# Patient Record
Sex: Female | Born: 1970 | Hispanic: No | Smoking: Current some day smoker
Health system: Southern US, Community
[De-identification: ages and names within clinical notes are randomized; demographics above are authoritative.]

## PROBLEM LIST (undated history)

## (undated) DIAGNOSIS — F419 Anxiety disorder, unspecified: Secondary | ICD-10-CM

## (undated) DIAGNOSIS — D219 Benign neoplasm of connective and other soft tissue, unspecified: Secondary | ICD-10-CM

## (undated) DIAGNOSIS — F32A Depression, unspecified: Secondary | ICD-10-CM

## (undated) DIAGNOSIS — D649 Anemia, unspecified: Secondary | ICD-10-CM

## (undated) DIAGNOSIS — G43909 Migraine, unspecified, not intractable, without status migrainosus: Secondary | ICD-10-CM

## (undated) HISTORY — DX: Depression, unspecified: F32.A

## (undated) HISTORY — DX: Anxiety disorder, unspecified: F41.9

## (undated) HISTORY — DX: Migraine, unspecified, not intractable, without status migrainosus: G43.909

## (undated) HISTORY — PX: EYE SURGERY: SHX253

## (undated) HISTORY — PX: TONSILLECTOMY: SUR1361

---

## 2003-10-30 ENCOUNTER — Other Ambulatory Visit: Admission: RE | Admit: 2003-10-30 | Discharge: 2003-10-30 | Payer: Self-pay | Admitting: Obstetrics and Gynecology

## 2008-04-09 ENCOUNTER — Encounter: Admission: RE | Admit: 2008-04-09 | Discharge: 2008-04-09 | Payer: Self-pay | Admitting: Obstetrics and Gynecology

## 2008-04-09 IMAGING — MG MM DIAGNOSTIC BILATERAL
5 series · 5 of 5 positions shown · non-contrast
Comparison: None

[DATE] – DUPLICATE COPY for exam association in RIS. No change from original report.
CLINICAL DATA: Mass felt by the patient in the upper outer left
 breast.

 DIGITAL DIAGNOSTIC BILATERAL MAMMOGRAM WITH CAD AND LEFT BREAST
 ULTRASOUND

[R CC]
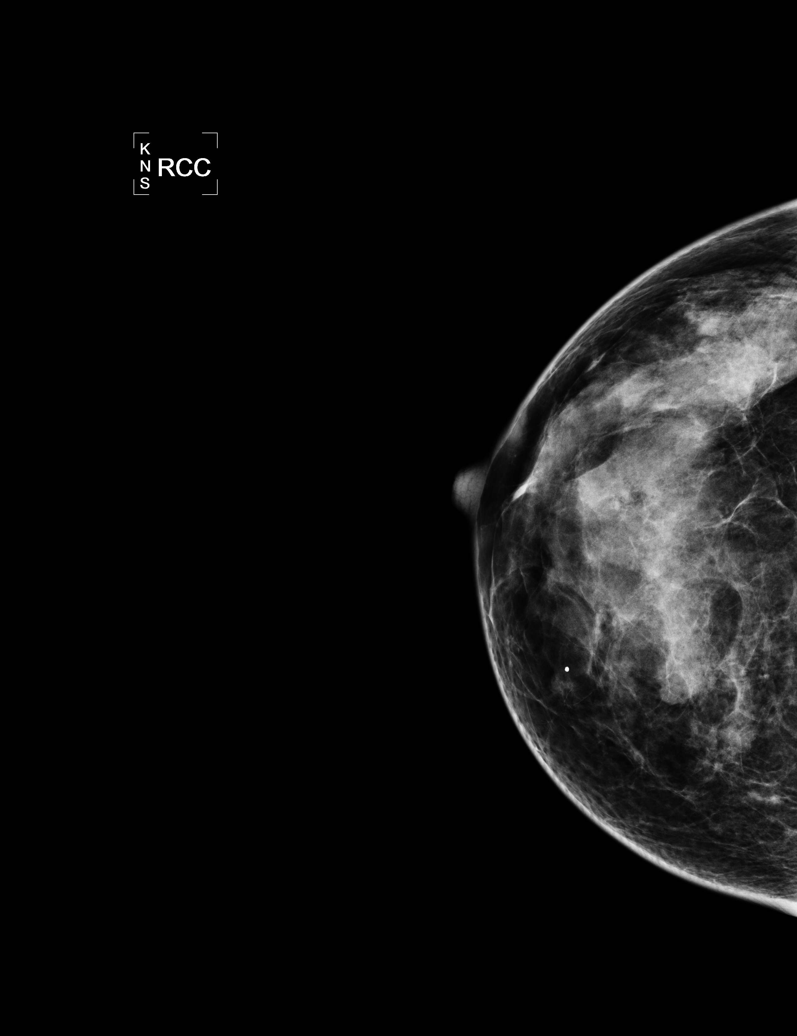

[L CC]
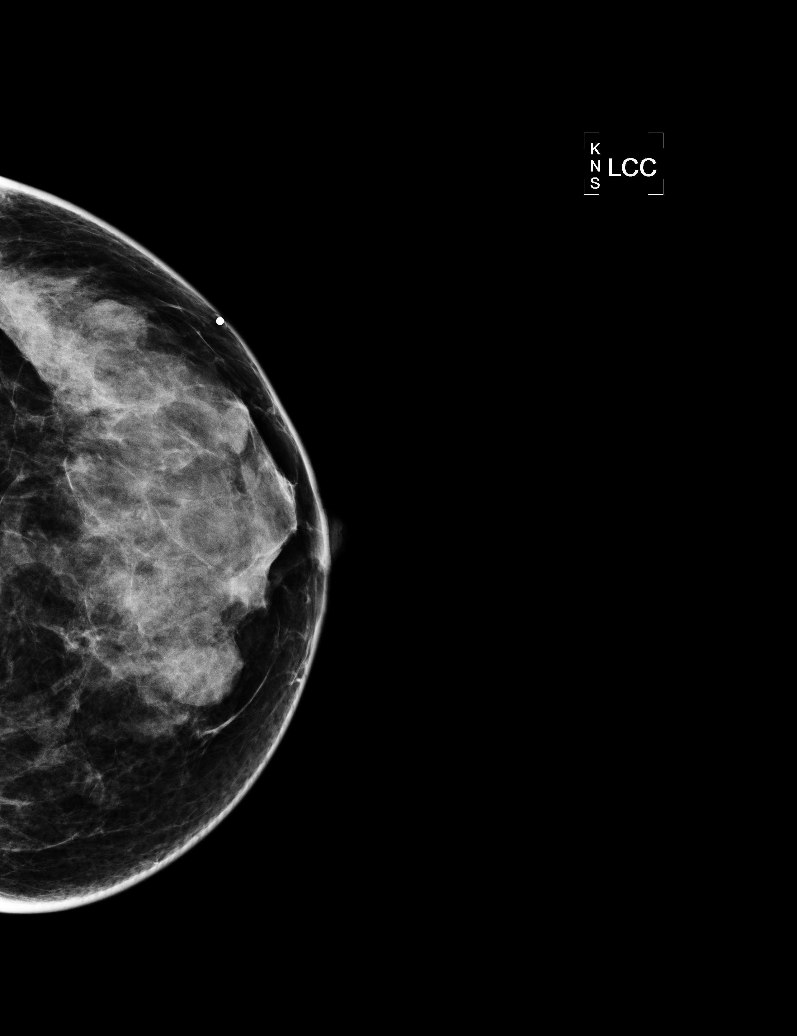

[L MLO]
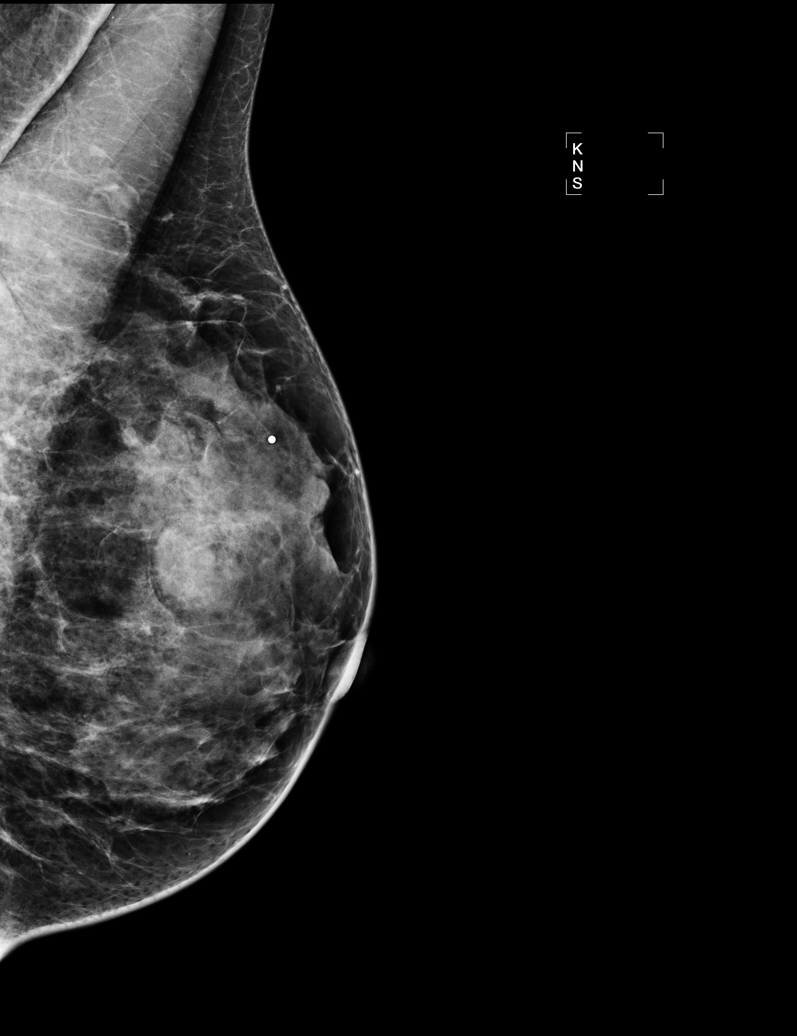

[R MLO]
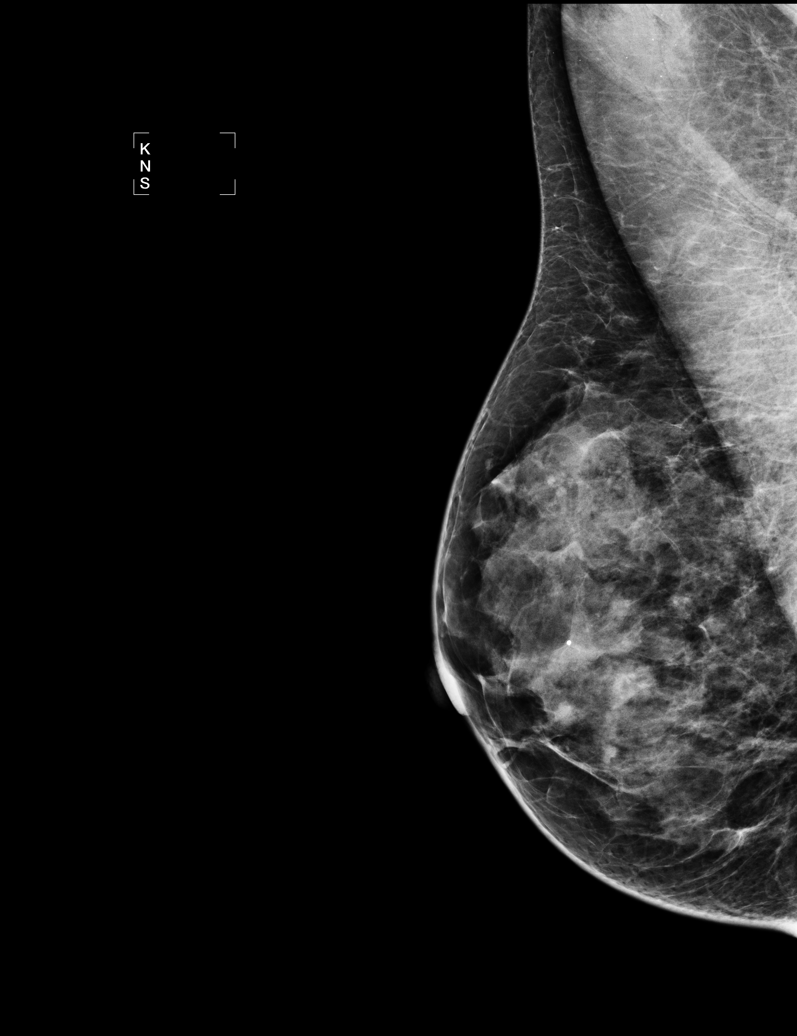

[L TAN]
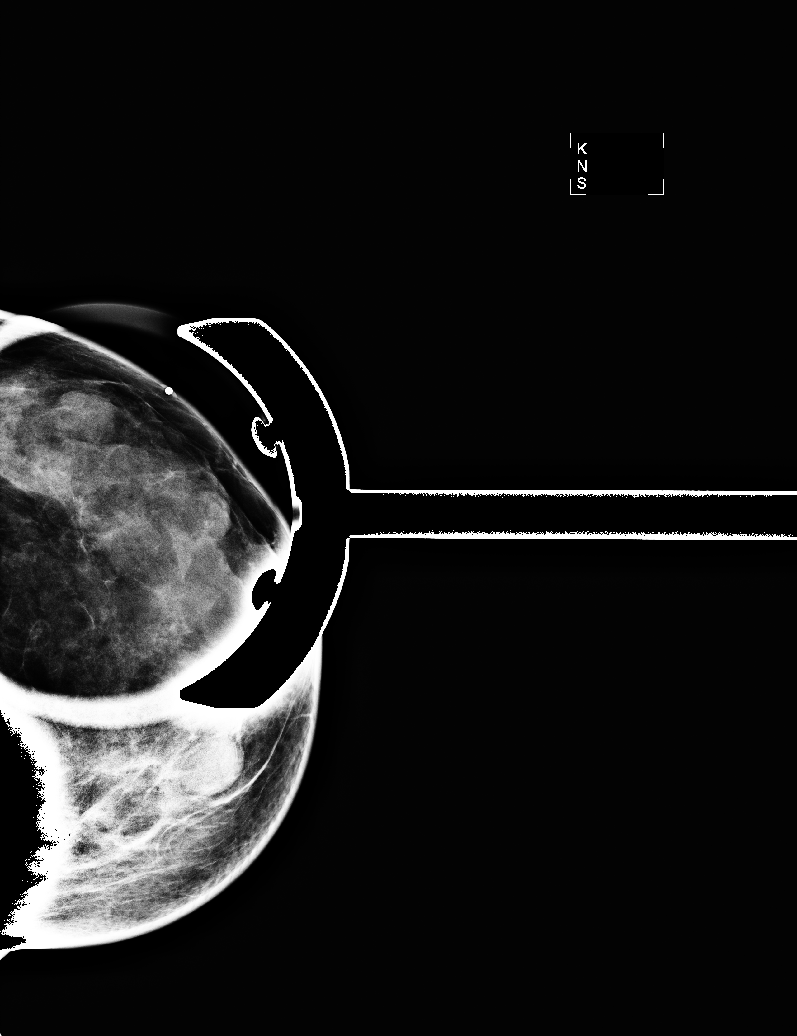

[5 of 5 positions shown; findings below may reference images not displayed]

FINDINGS: Heterogeneously dense parenchyma in each breast.
 Multiple oval and rounded nodules in the left breast, including the
 area felt by the patient. The visualized margins are smooth. The
 majority of the margins are obscured. No findings on the right
 suspicious for malignancy.

 On physical exam, a small palpable mass is confirmed at the
 location felt by the patient in the 2 o'clock position of the left
 breast, 2 cm from the nipple. A larger mass is palpable in the
 upper inner aspect of the left breast in the 11 o'clock position.

 Ultrasound is performed, showing a 1.3 x 1.2 x 0.8 cm solid mass in
 the 2 o'clock position, 2 cm from the nipple. A 0.8 x 0.7 x 0.7 cm
 solid mass is demonstrated in the 1:30 o'clock position, 1 cm from
 the nipple. There is also a a 1.7 x 1.7 x 0.6 cm solid mass in the
 11 o'clock position, 2 cm from the nipple. All of these masses are
 smoothly marginated and horizontally oriented. There is also a
 x 0.5 x 0.3 cm cyst in the 12 o'clock position, 2 cm from the
 nipple.
IMPRESSION: Three left breast solid masses with imaging features compatible
 with fibroadenomas. A follow-up left breast ultrasound is
 recommended in 6 months to assess stability. This has been
 discussed with the patient. No evidence of malignancy on the
 right.

 BI-RADS CATEGORY 3: Probably benign finding(s) - short interval
 follow-up suggested.

## 2008-04-09 IMAGING — US UNKNOWN US STUDY
1 series · 13 of 16 positions shown · non-contrast
Comparison: None

[DATE] – DUPLICATE COPY for exam association in RIS. No change from original report.
CLINICAL DATA: Mass felt by the patient in the upper outer left
 breast.

 DIGITAL DIAGNOSTIC BILATERAL MAMMOGRAM WITH CAD AND LEFT BREAST
 ULTRASOUND

[Series 1: unknown us study · 13 of 16 slices shown]
[im 1/16]
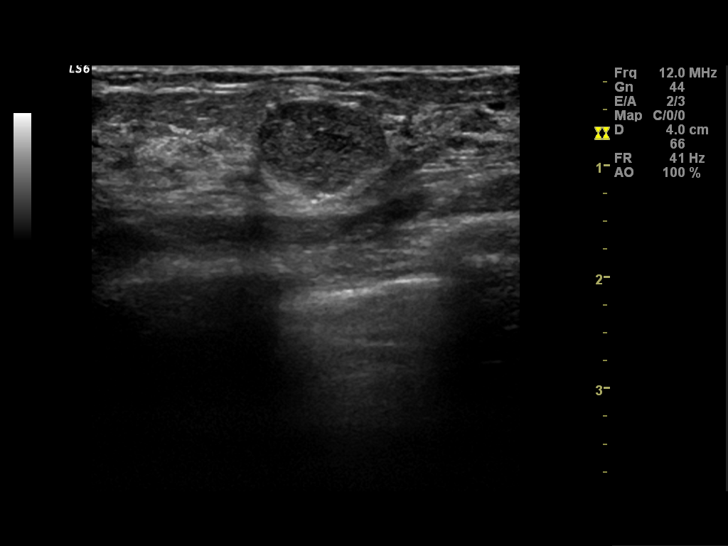
[im 2/16]
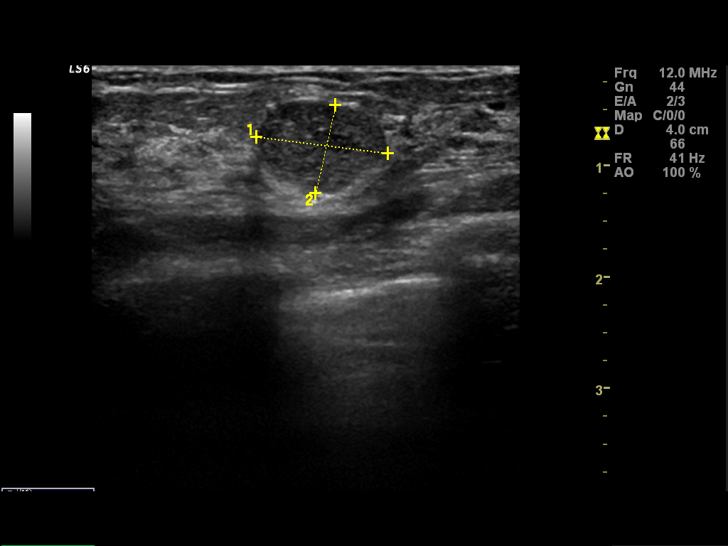
[im 4/16]
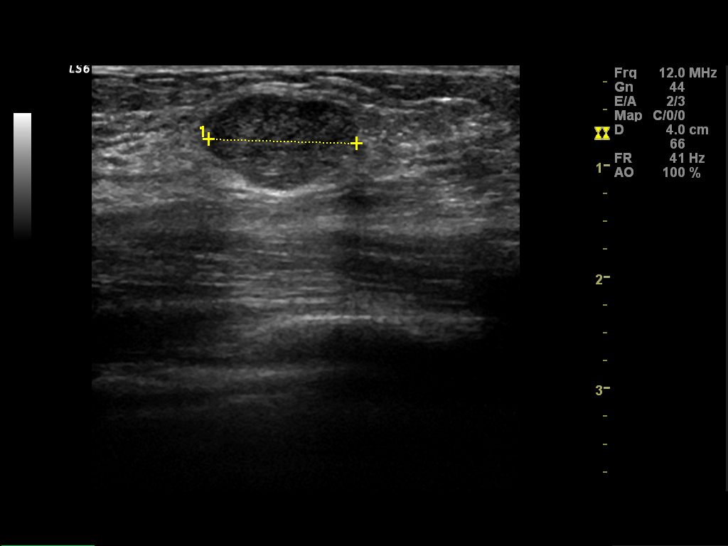
[im 5/16]
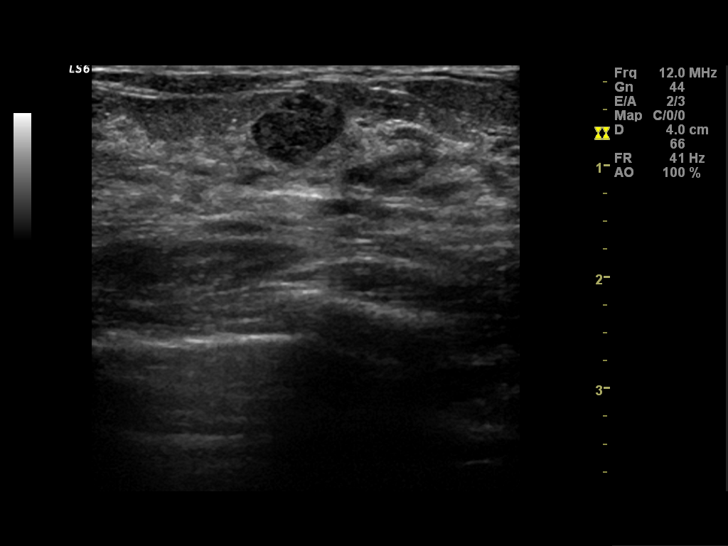
[im 6/16]
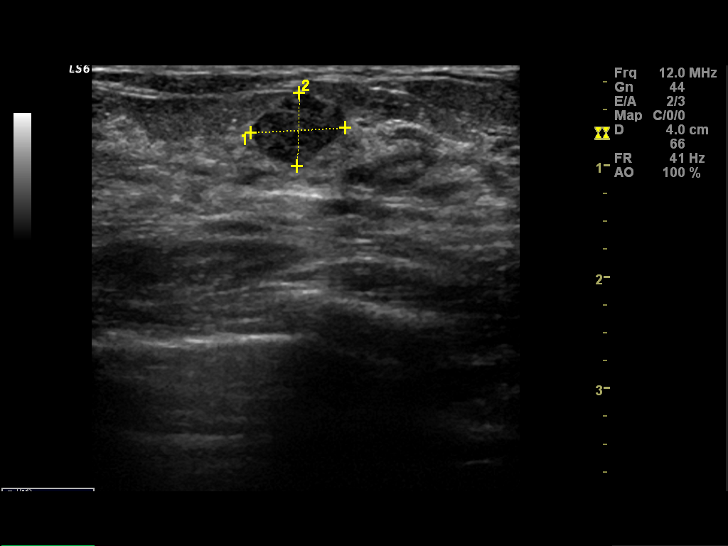
[im 7/16]
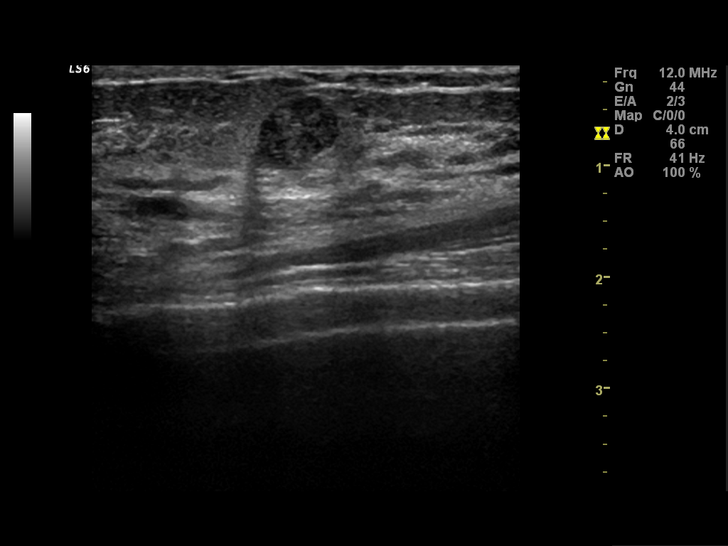
[im 9/16]
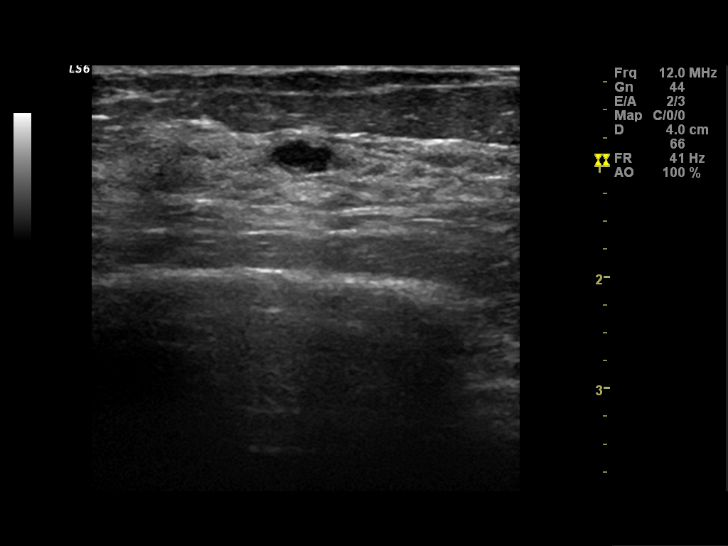
[im 10/16]
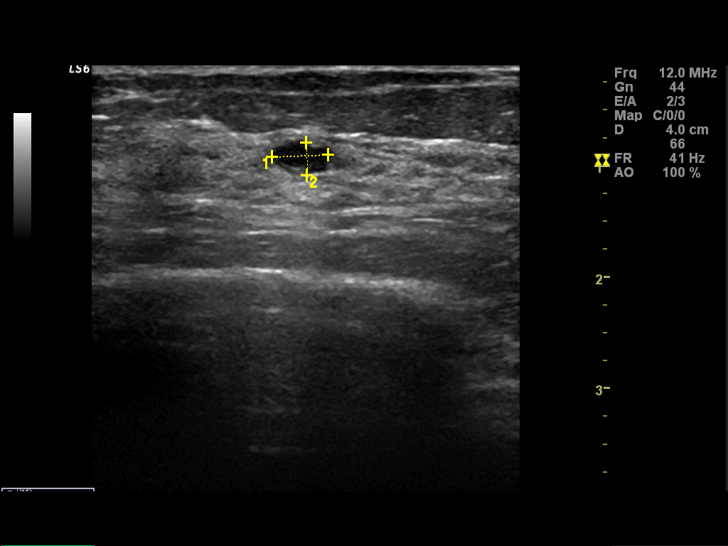
[im 11/16]
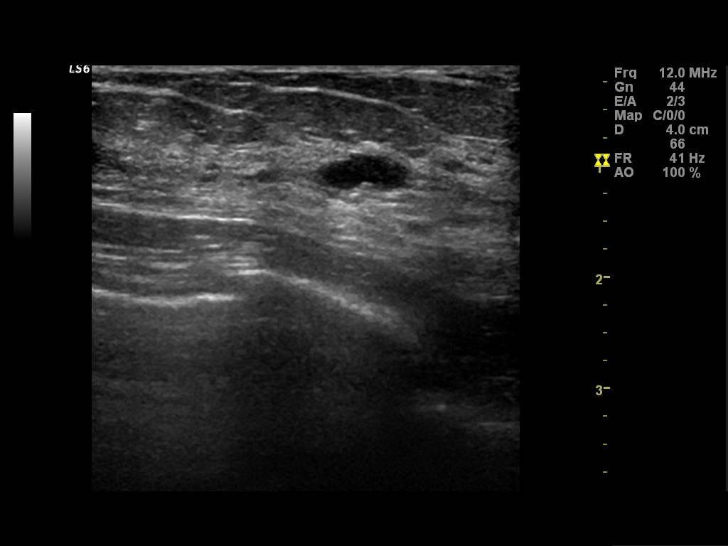
[im 12/16]
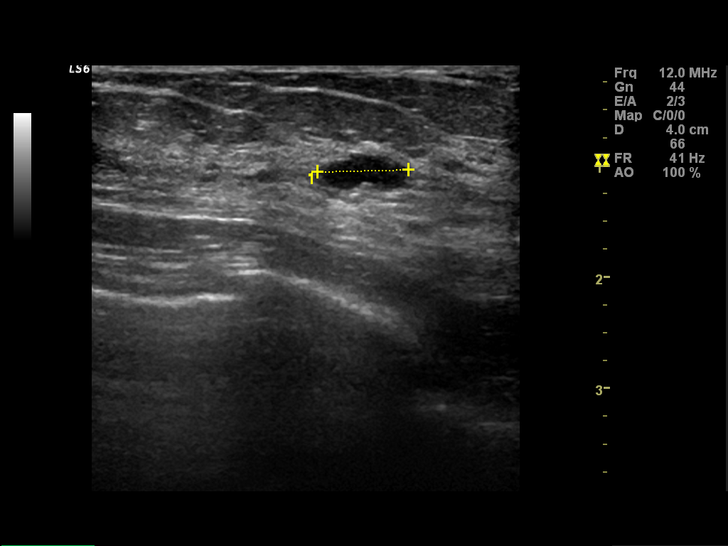
[im 13/16]
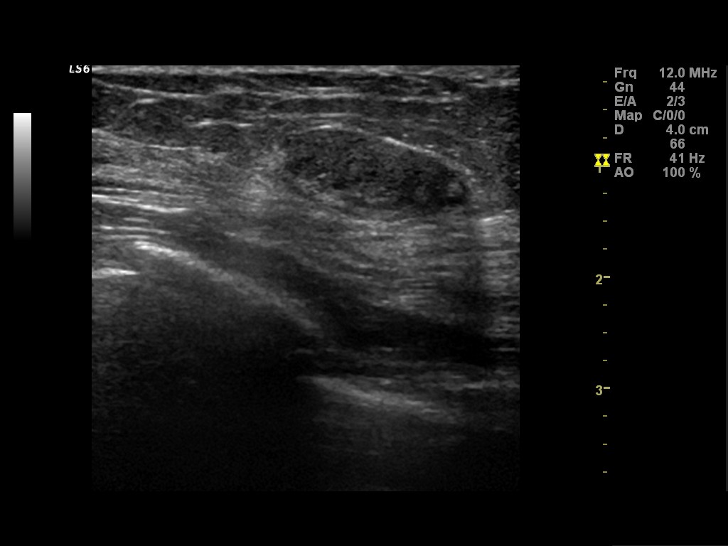
[im 15/16]
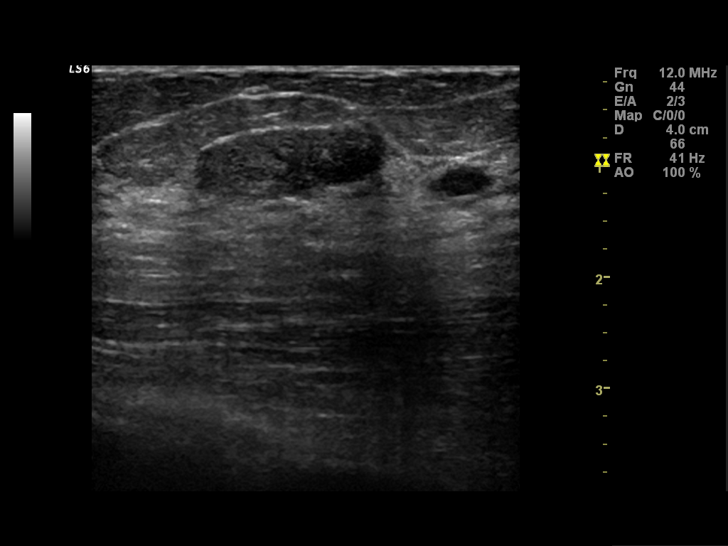
[im 16/16]
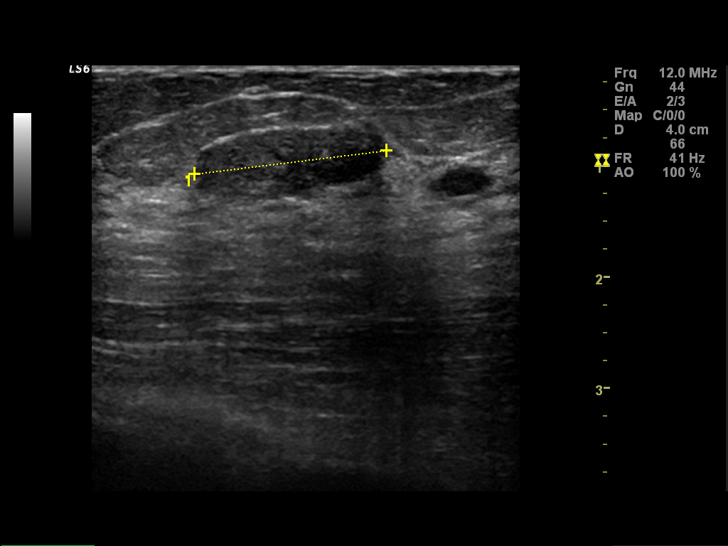

[13 of 16 positions shown; findings below may reference images not displayed]

FINDINGS: Heterogeneously dense parenchyma in each breast.
 Multiple oval and rounded nodules in the left breast, including the
 area felt by the patient. The visualized margins are smooth. The
 majority of the margins are obscured. No findings on the right
 suspicious for malignancy.

 On physical exam, a small palpable mass is confirmed at the
 location felt by the patient in the 2 o'clock position of the left
 breast, 2 cm from the nipple. A larger mass is palpable in the
 upper inner aspect of the left breast in the 11 o'clock position.

 Ultrasound is performed, showing a 1.3 x 1.2 x 0.8 cm solid mass in
 the 2 o'clock position, 2 cm from the nipple. A 0.8 x 0.7 x 0.7 cm
 solid mass is demonstrated in the 1:30 o'clock position, 1 cm from
 the nipple. There is also a a 1.7 x 1.7 x 0.6 cm solid mass in the
 11 o'clock position, 2 cm from the nipple. All of these masses are
 smoothly marginated and horizontally oriented. There is also a
 x 0.5 x 0.3 cm cyst in the 12 o'clock position, 2 cm from the
 nipple.
IMPRESSION: Three left breast solid masses with imaging features compatible
 with fibroadenomas. A follow-up left breast ultrasound is
 recommended in 6 months to assess stability. This has been
 discussed with the patient. No evidence of malignancy on the
 right.

 BI-RADS CATEGORY 3: Probably benign finding(s) - short interval
 follow-up suggested.

## 2008-10-13 ENCOUNTER — Encounter: Admission: RE | Admit: 2008-10-13 | Discharge: 2008-10-13 | Payer: Self-pay | Admitting: Obstetrics and Gynecology

## 2008-10-13 IMAGING — US UNKNOWN US STUDY
1 series · 12 of 12 positions shown · non-contrast
Comparison: [DATE]

CLINICAL DATA: 6 months follow-up the masses in the left breast

LEFT BREAST ULTRASOUND

[Series 1: unknown us study · 12 of 12 slices shown]
[im 1/12]
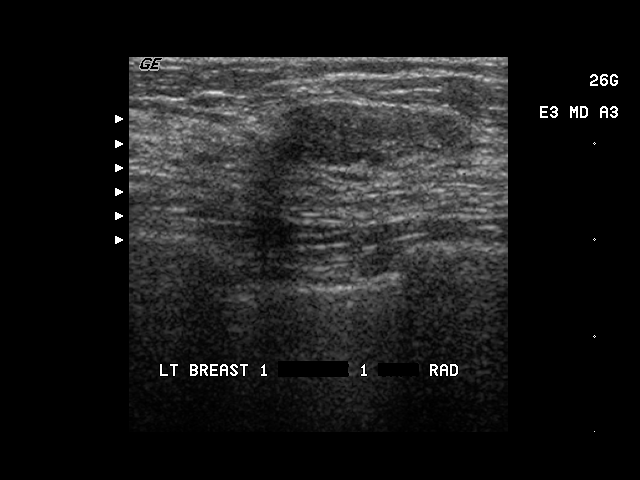
[im 2/12]
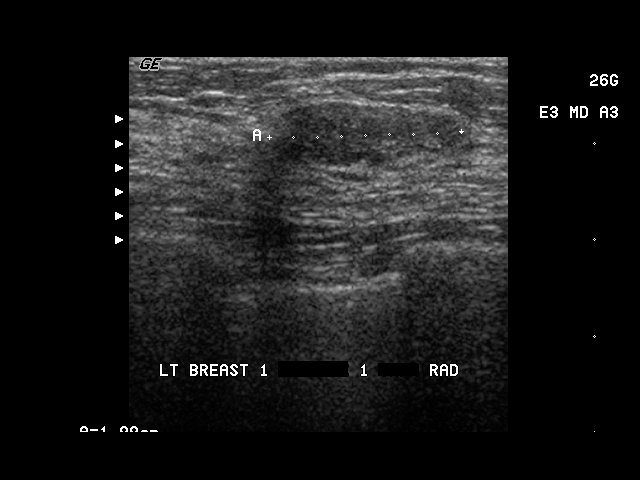
[im 3/12]
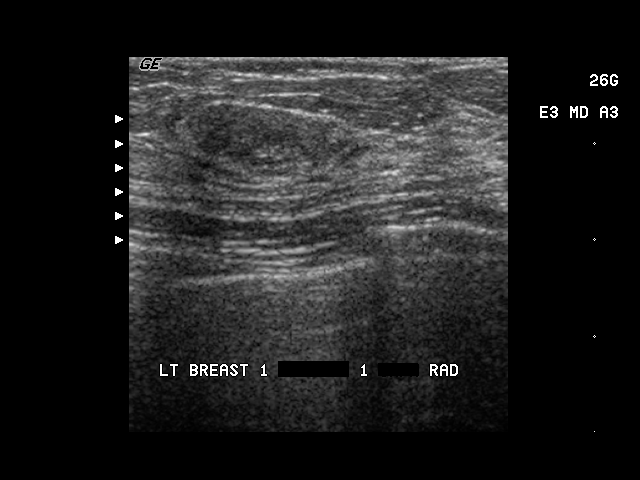
[im 4/12]
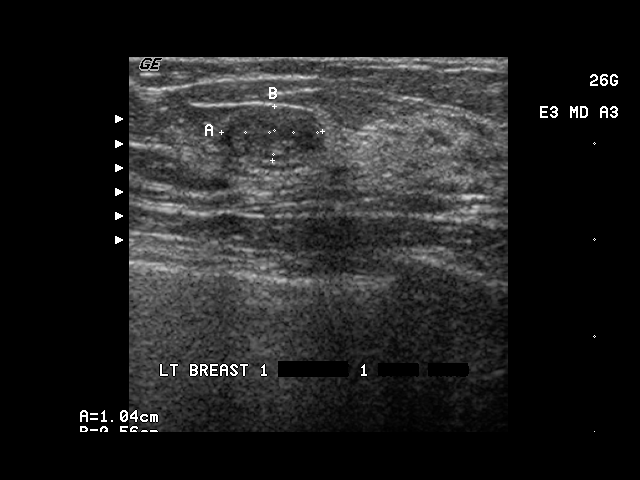
[im 5/12]
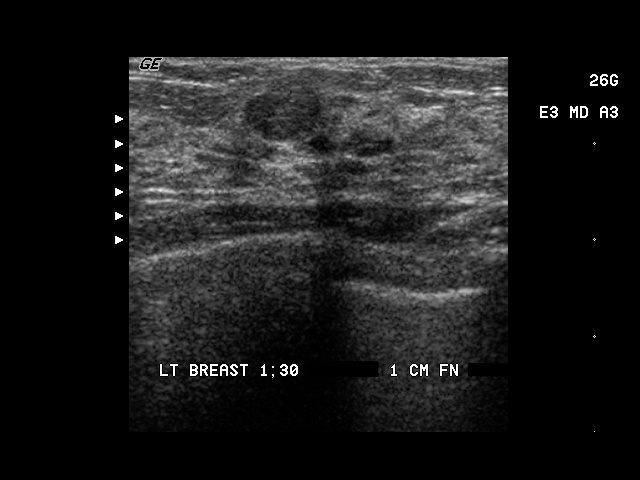
[im 6/12]
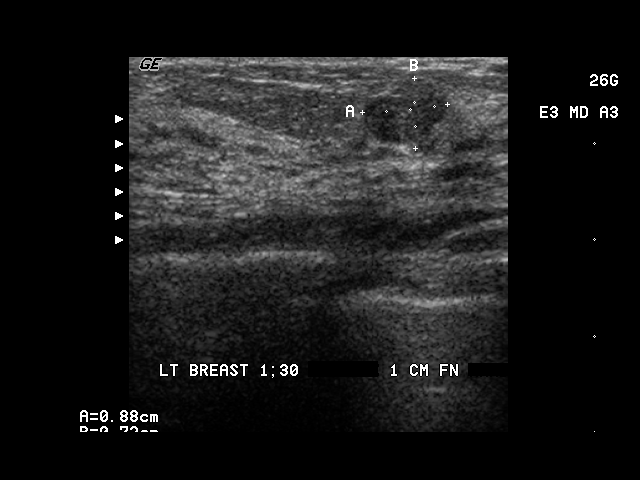
[im 7/12]
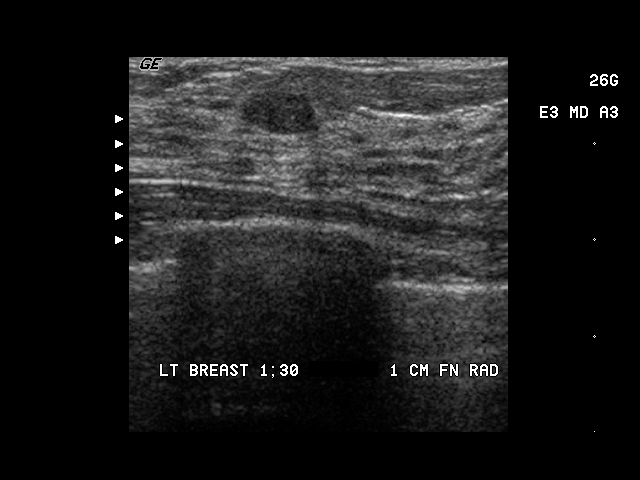
[im 8/12]
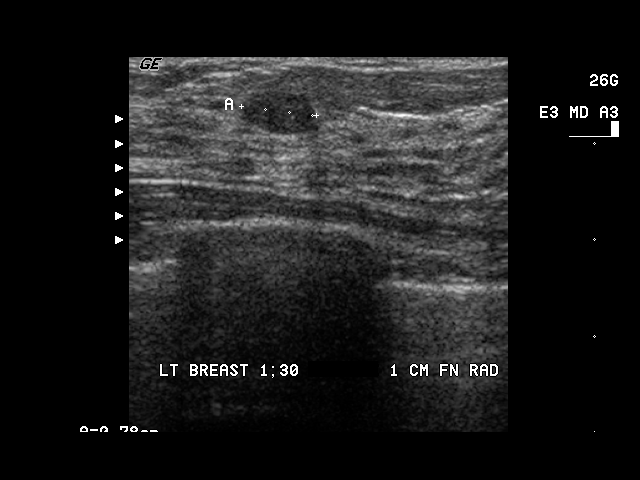
[im 9/12]
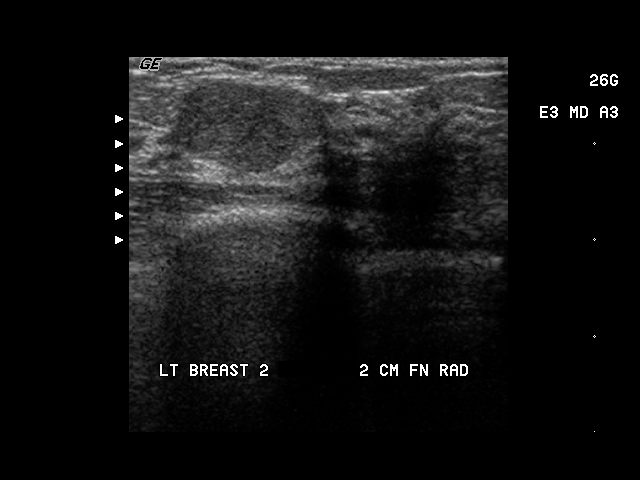
[im 10/12]
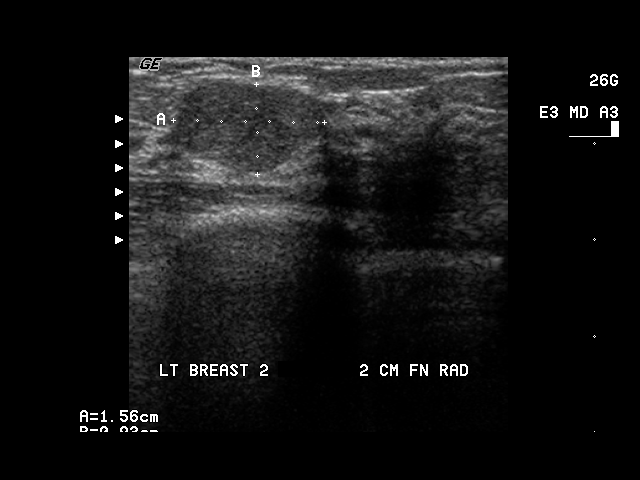
[im 11/12]
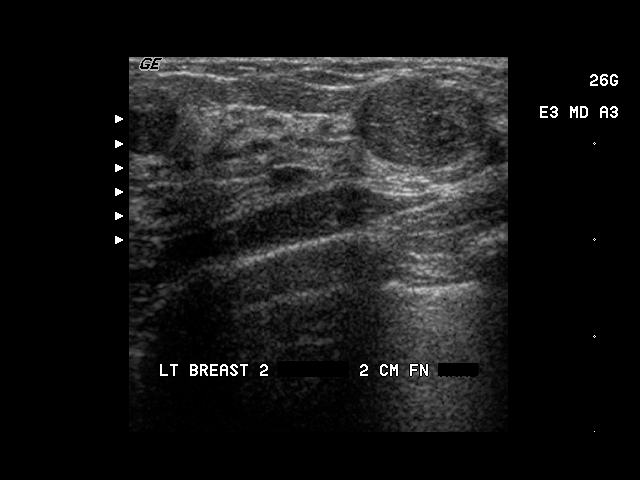
[im 12/12]
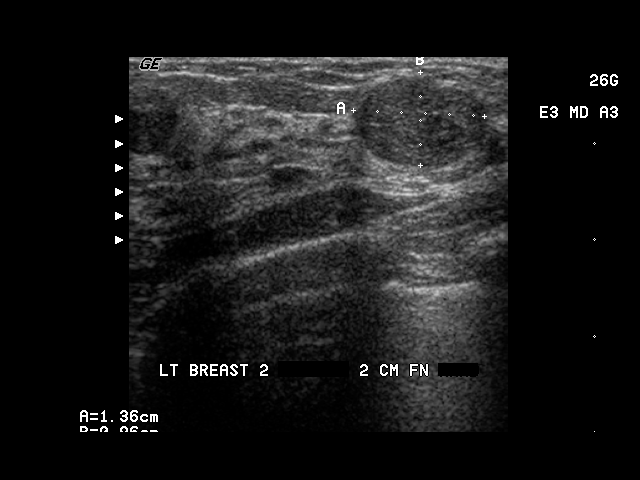

[12 of 12 positions shown; findings below may reference images not displayed]

FINDINGS: Ultrasound is performed, showing a 1.66 x 1 x 1.36 cm
oval hypoechoic lesion at the left breast two o'clock position 2 cm
from the nipple, minimally larger compared prior exam.  At the left
breast [DATE] position 1 cm from the nipple, there is a 0.88 x 0.72 x
0.78 cm oval hypoechoic lesion not significantly changed compared
prior exam.  At the left breast 11 o'clock position 1 cm from
nipple, there is a 1.04 x 0.7 x 1.99 cm oval hypoechoic lesion not
significantly changed compared to11 prior exam.  These are probably
fibroadenomas.
IMPRESSION: Probable benign findings, recommend 6-month follow-up ultrasound
left breast

BI-RADS CATEGORY 3:  Probably benign finding(s) - short interval
follow-up suggested.

## 2009-05-19 ENCOUNTER — Encounter: Admission: RE | Admit: 2009-05-19 | Discharge: 2009-05-19 | Payer: Self-pay | Admitting: Obstetrics and Gynecology

## 2009-05-19 IMAGING — US US BREAST L
1 series · 12 of 12 positions shown · non-contrast
Comparison: [DATE] and [DATE]

CLINICAL DATA: Follow-up left breast nodules.

LEFT BREAST ULTRASOUND

[Series 1: us breast left · 12 of 12 slices shown]
[im 1/12]
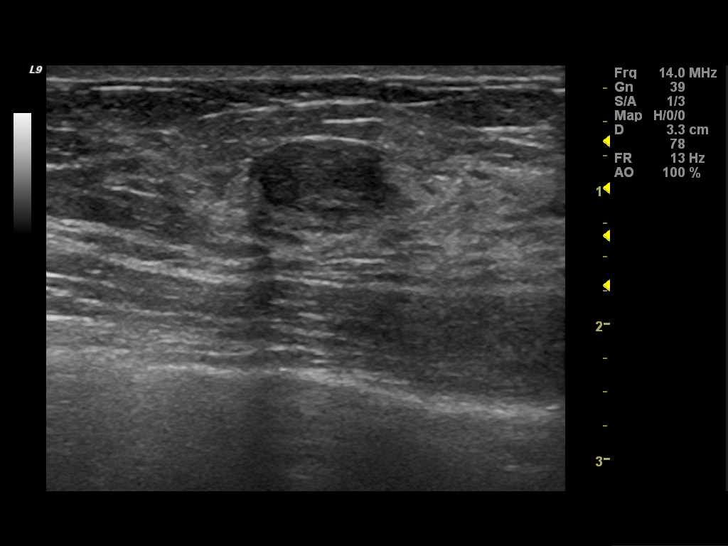
[im 2/12]
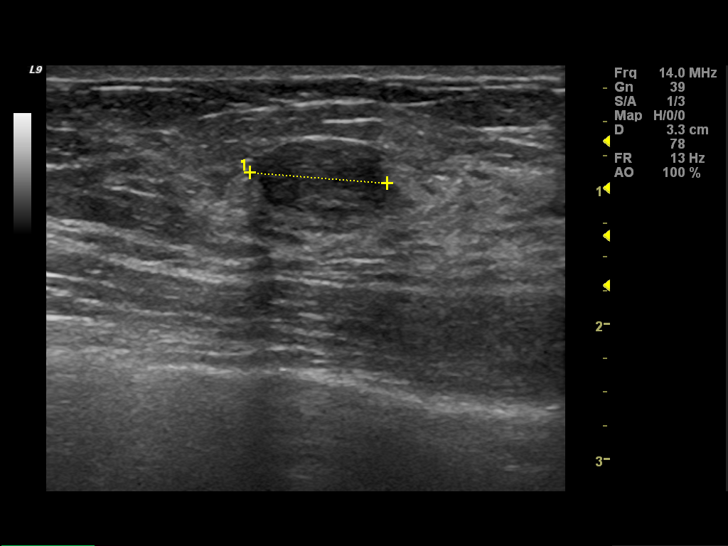
[im 3/12]
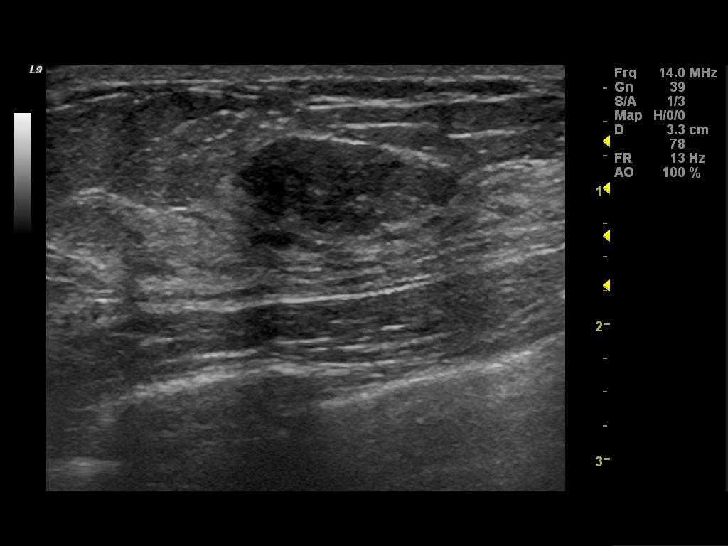
[im 4/12]
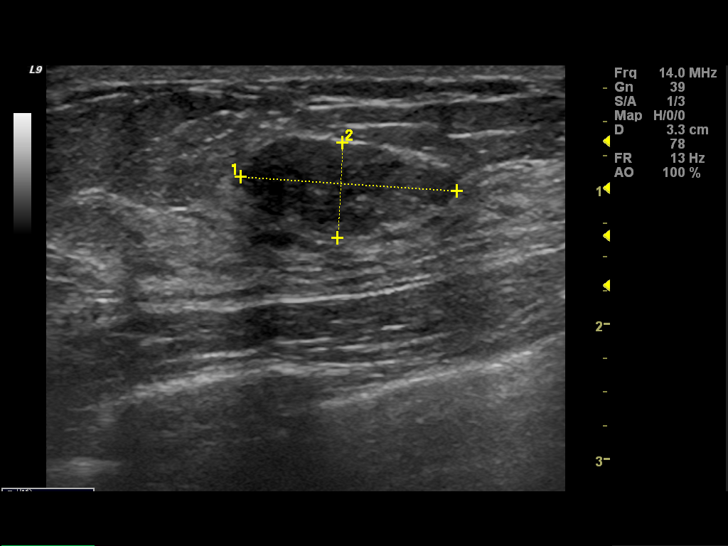
[im 5/12]
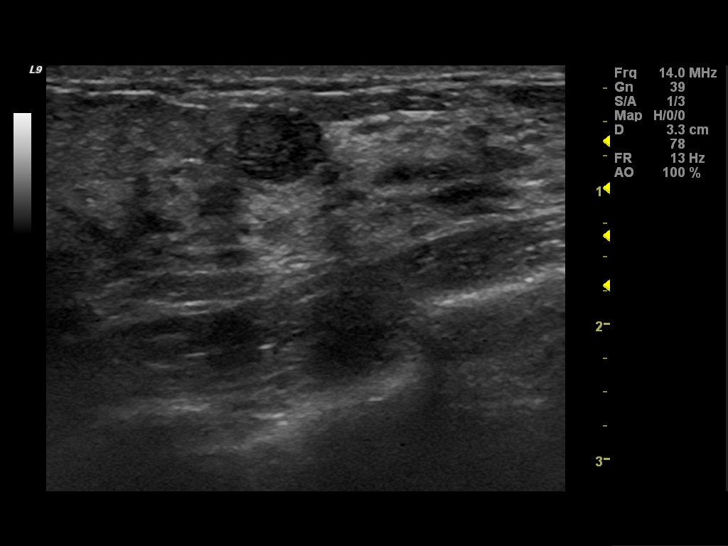
[im 6/12]
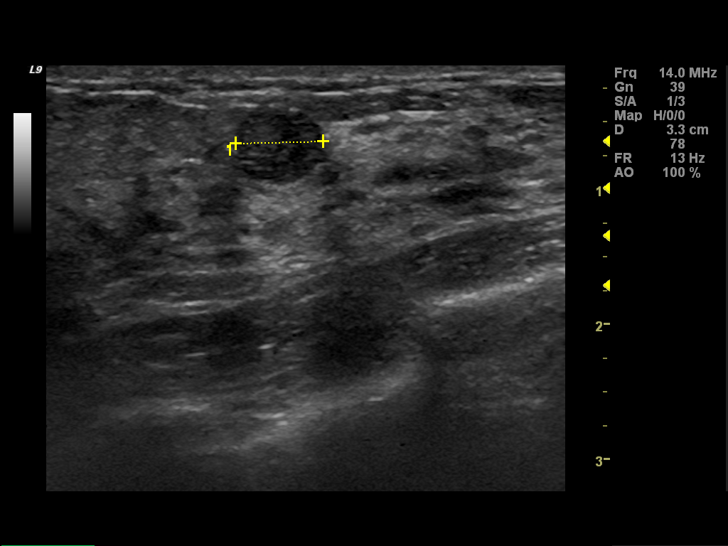
[im 7/12]
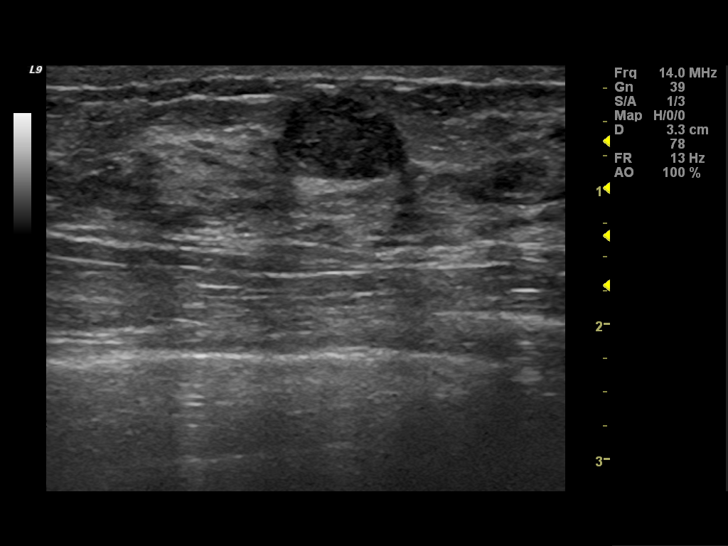
[im 8/12]
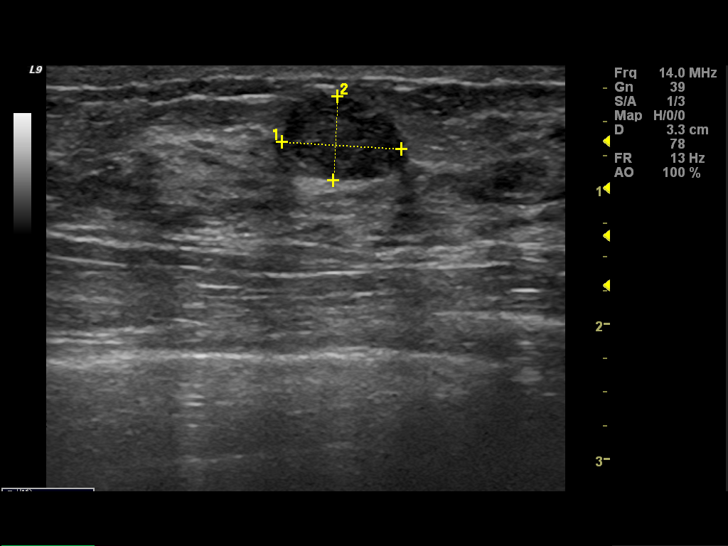
[im 9/12]
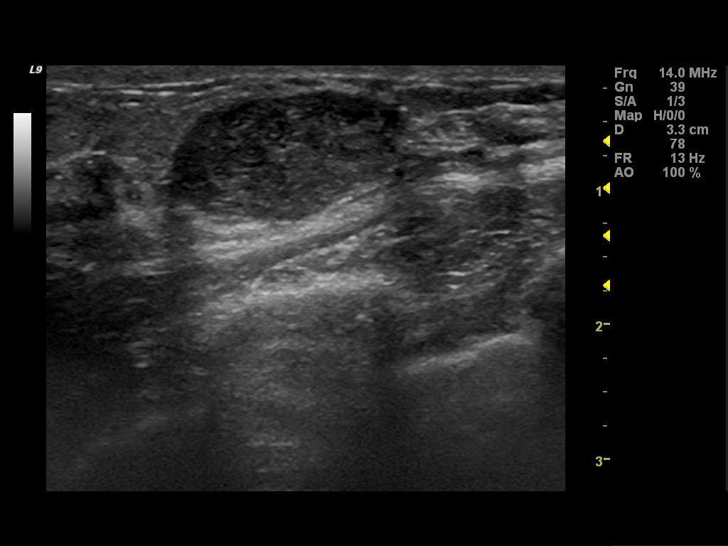
[im 10/12]
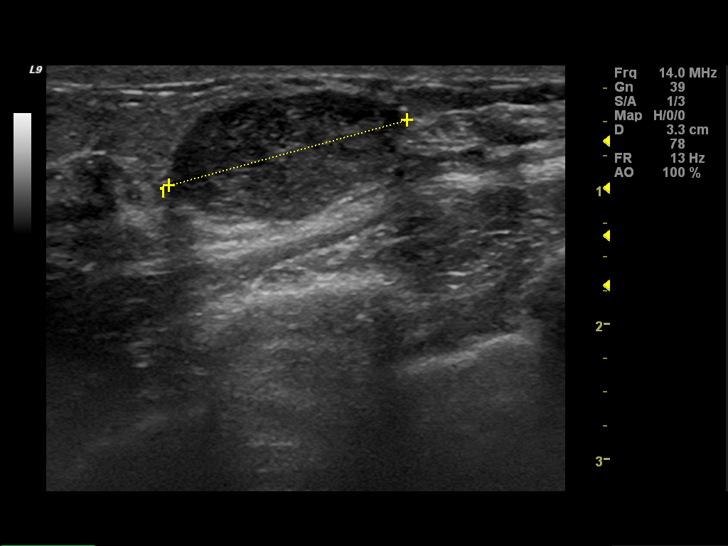
[im 11/12]
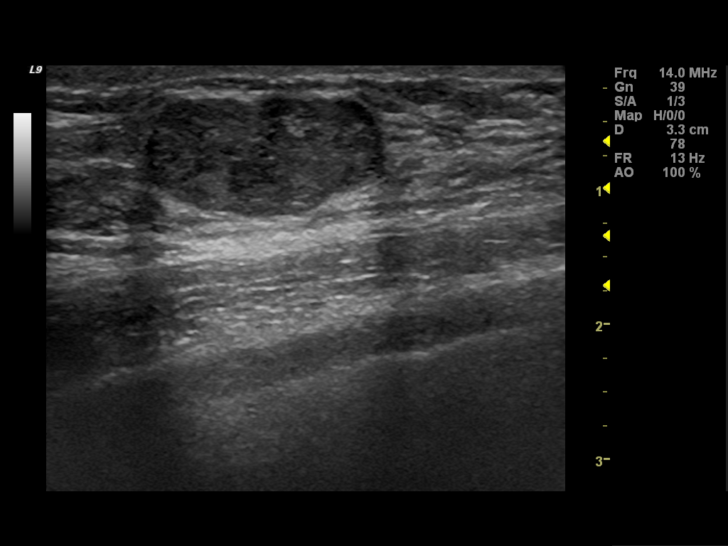
[im 12/12]
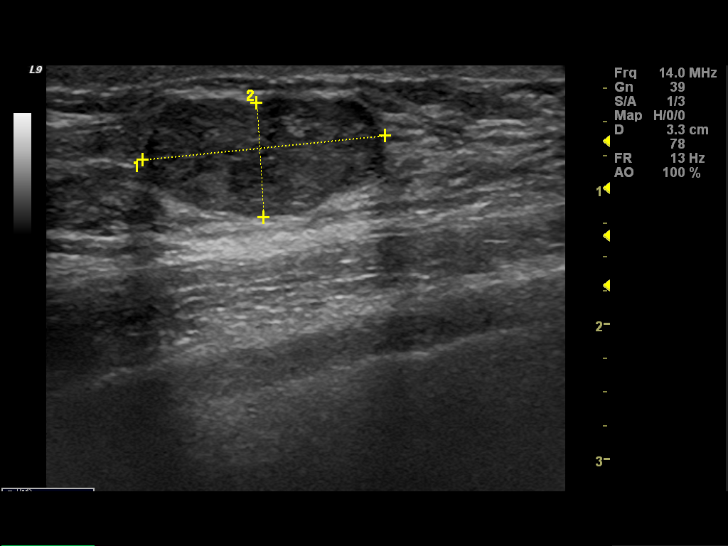

[12 of 12 positions shown; findings below may reference images not displayed]

On physical exam, several mobile nodules identified within the
upper left breast.
FINDINGS: Ultrasound is performed, showing stable well-
circumscribed hypoechoic nodules which include the following:
A 16 x 7 x 10 mm nodule in the 11 o'clock position of the left
breast 2 cm from the nipple.
An 8 x 6 x 7 mm nodule in the [DATE] position of the left breast 1 cm
from the nipple.
An 18 x 9 x 18 mm mass in the 2 o'clock position of the left breast
2 cm from the nipple.

No interval changes identified.
IMPRESSION: Stable probable fibroadenomas within the left breast.  Recommend
follow-up in 1 year to complete 2-year evaluation.

These findings were discussed with the patient.  She was encouraged
to continue monthly self exams and to contact her primary physician
if any changes noted.

BI-RADS CATEGORY 3:  Probably benign finding(s) - short interval
follow-up suggested.

Recommend bilateral diagnostic mammograms and left breast
ultrasound in 12 months to resume annual mammogram schedule and to
reassess left breast nodules.

## 2010-05-20 ENCOUNTER — Encounter: Admission: RE | Admit: 2010-05-20 | Discharge: 2010-05-20 | Payer: Self-pay | Admitting: Obstetrics and Gynecology

## 2010-05-20 IMAGING — US UNKNOWN US STUDY
1 series · 13 of 13 positions shown · non-contrast
Comparison: Multiple priors

[DATE] –DUPLICATE COPY for exam association in RIS. No change from original report.
CLINICAL DATA: follow-up left breast masses.

 DIGITAL DIAGNOSTIC BILATERAL MAMMOGRAM WITH CAD AND LEFT BREAST
 ULTRASOUND:

[Series 1: unknown us study · 13 of 13 slices shown]
[im 1/13]
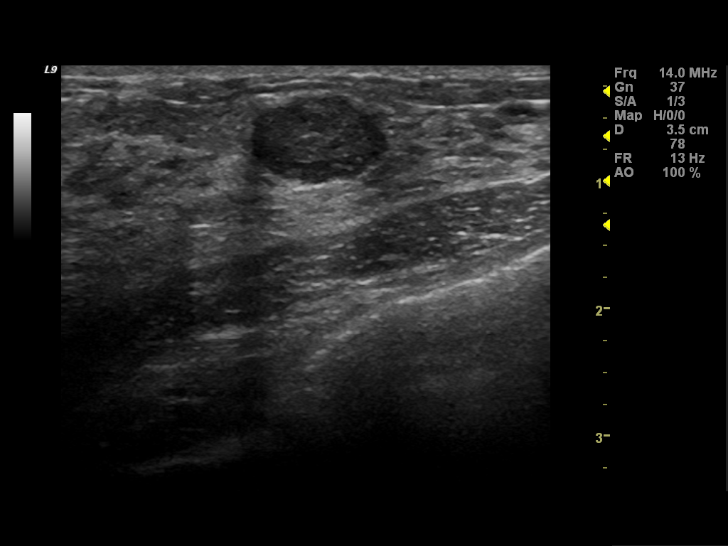
[im 2/13]
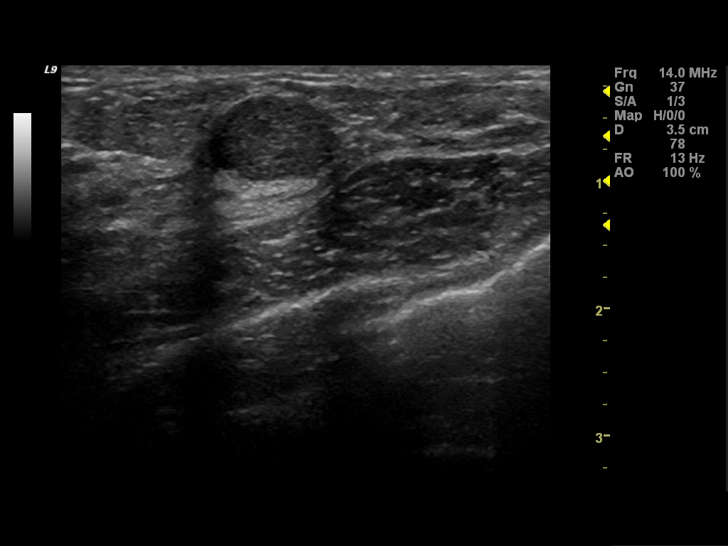
[im 3/13]
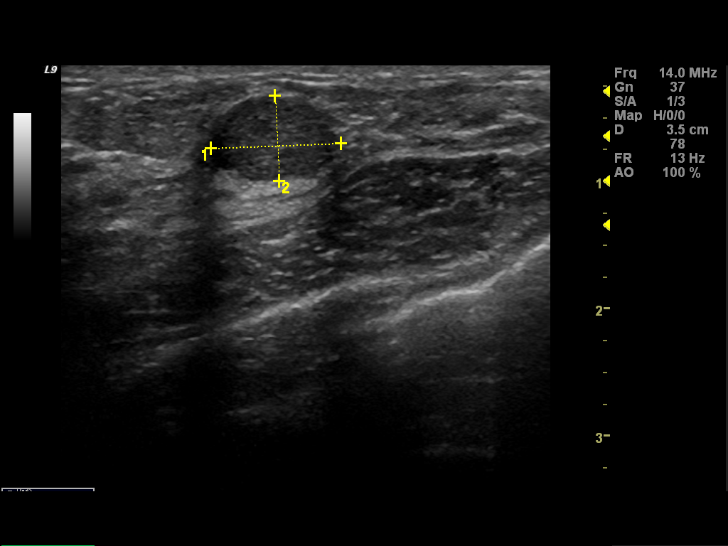
[im 4/13]
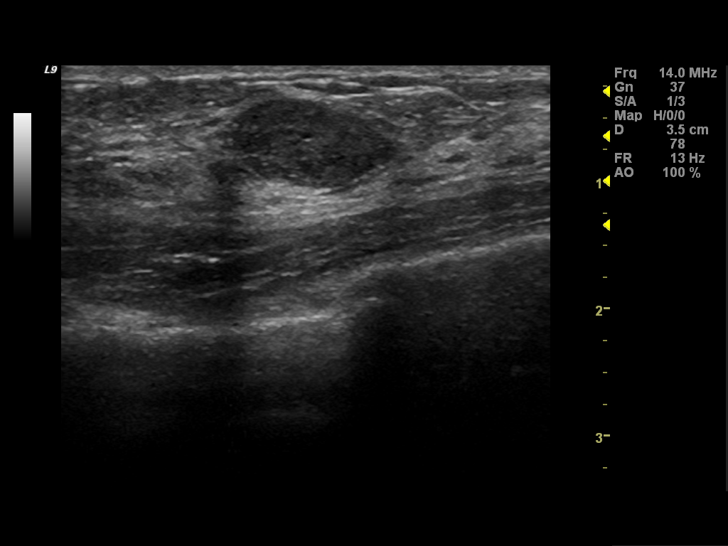
[im 5/13]
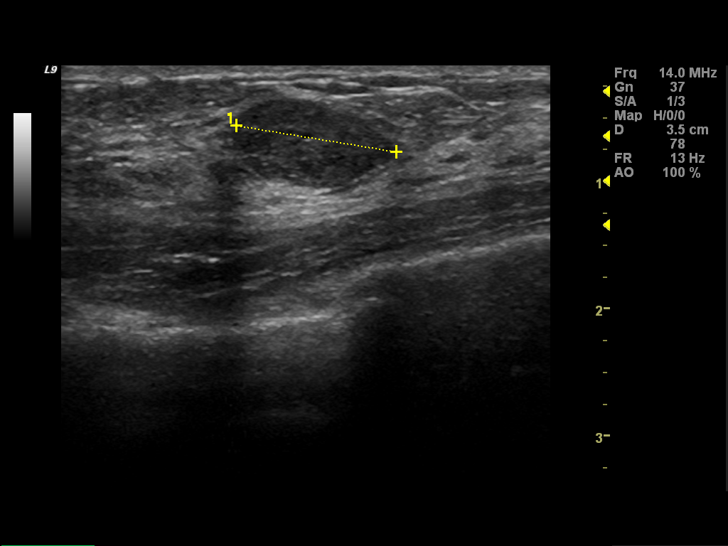
[im 6/13]
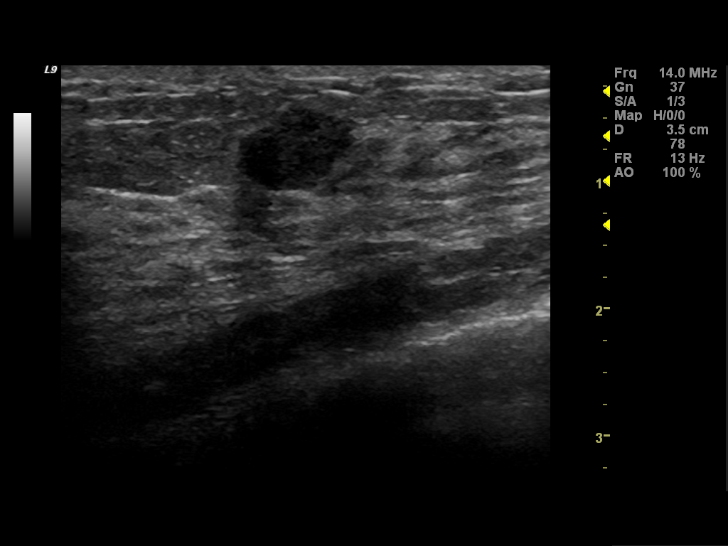
[im 7/13]
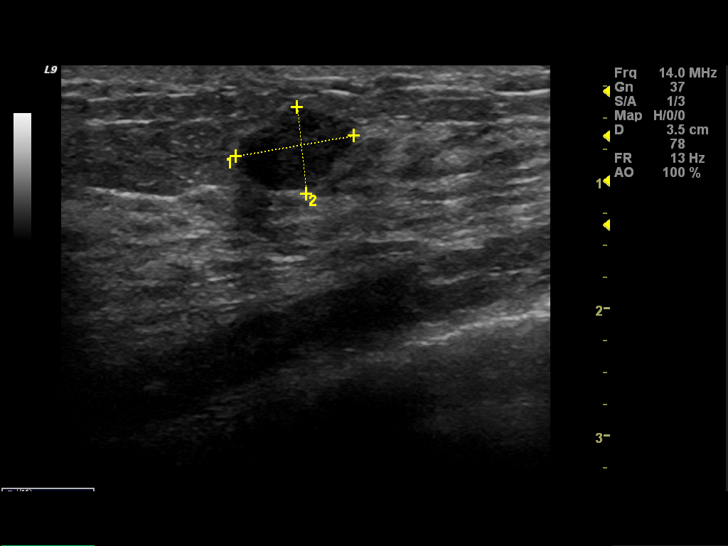
[im 8/13]
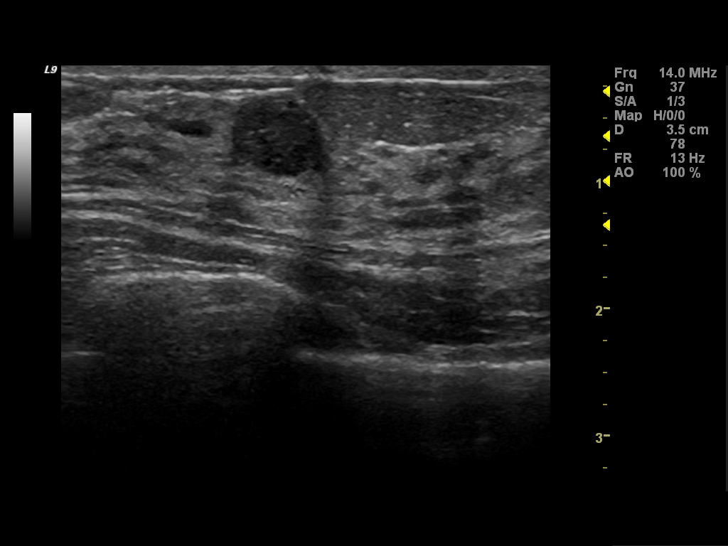
[im 9/13]
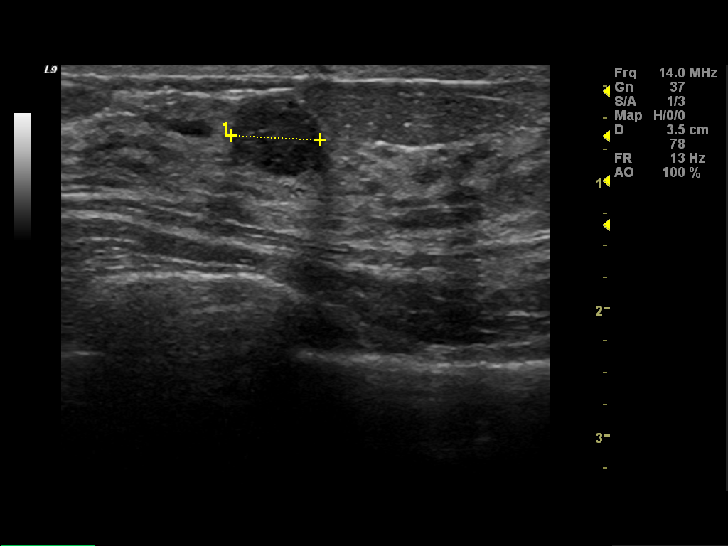
[im 10/13]
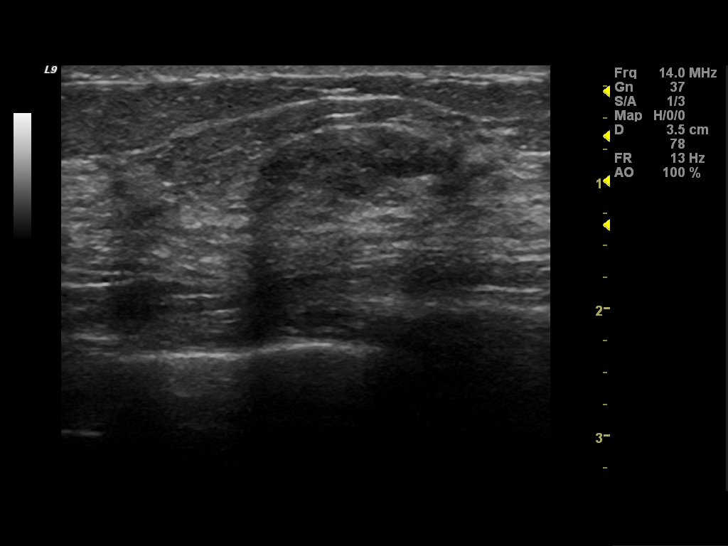
[im 11/13]
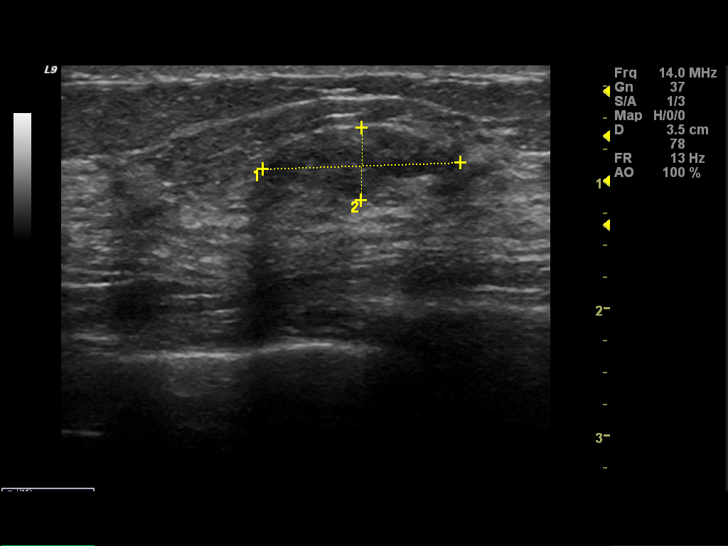
[im 12/13]
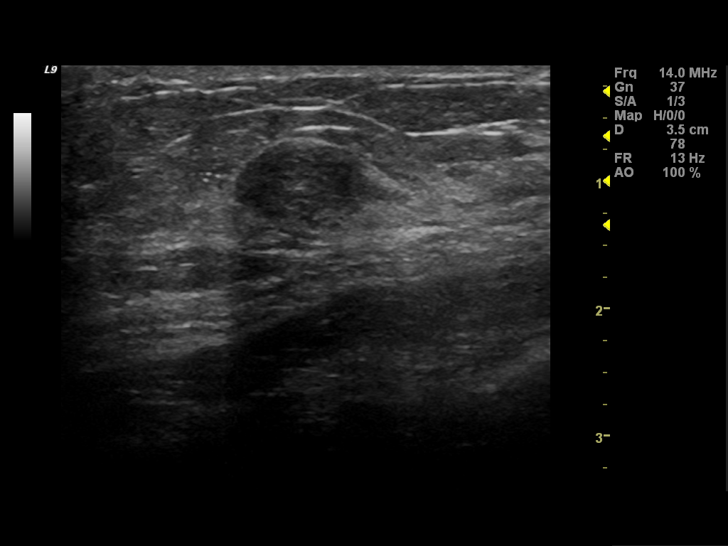
[im 13/13]
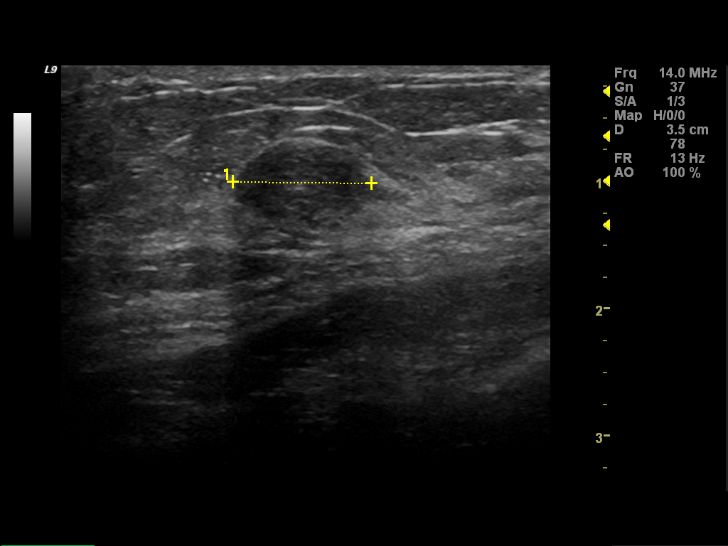

[13 of 13 positions shown; findings below may reference images not displayed]

FINDINGS: There is a dense fibroglandular pattern. No new masses
 or bleed type calcifications are seen. Again seen are multiple
 round and oval masses in the left breast with circumscribed
 margins. Compared with priors.
 Mammographic images were processed with CAD.

 On physical exam, I palpate a firm, mobile mass in the [DATE]
 position of the left breast, 2 cm from the nipple.

 Ultrasound is performed, showing three oval, hypoechoic masses with
 circumscribed margins the left breast as follows: 11 o'clock, 2 cm
 from the left nipple measuring 0.6 x 1.6 x 1.1 cm (previously 0.7 x
 1.6 x 1.0 cm), 130, 2 cm from the nipple measuring 0.7 x 0.9 x
 cm (previously 0.8 x 0.6 x 0.7 cm) and at 2 o'clock, 2 cm from the
 nipple measuring 1.3 x 1.0 x 0.7 cm (previously 1.8 x 1.8 x
 cm).
IMPRESSION: Fibroadenomas, left breast. No mammographic evidence of
 malignancy. Recommend routine screening mammography in 1 year.

 BI-RADS CATEGORY 2: Benign finding(s).

## 2010-05-20 IMAGING — MG MM DIGITAL DIAGNOSTIC BILAT
4 series · 4 of 4 positions shown · non-contrast
Comparison: Multiple priors

[DATE] –DUPLICATE COPY for exam association in RIS. No change from original report.
CLINICAL DATA: follow-up left breast masses.

 DIGITAL DIAGNOSTIC BILATERAL MAMMOGRAM WITH CAD AND LEFT BREAST
 ULTRASOUND:

[R CC]
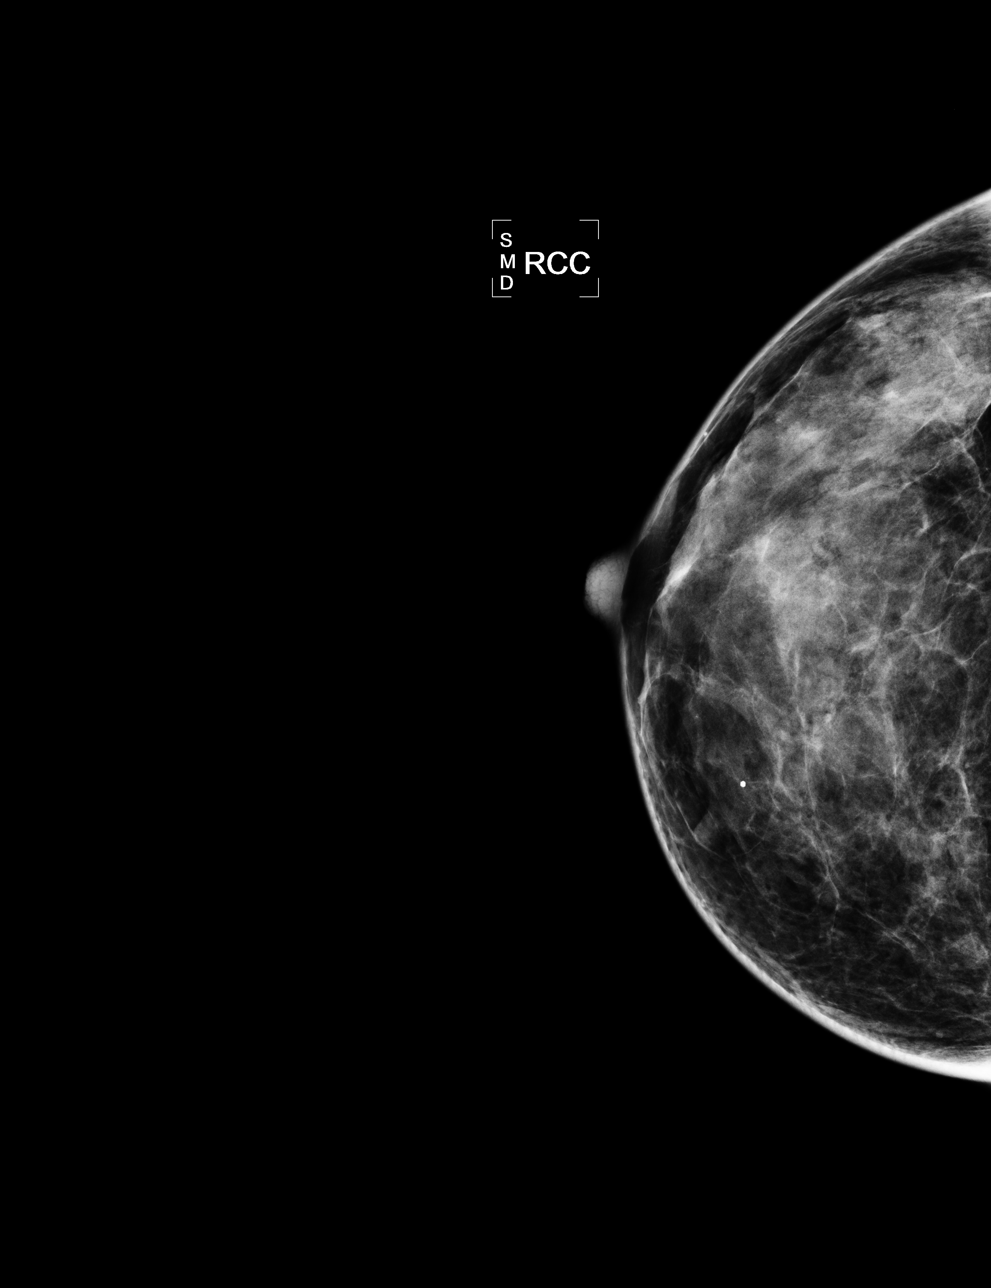

[L CC]
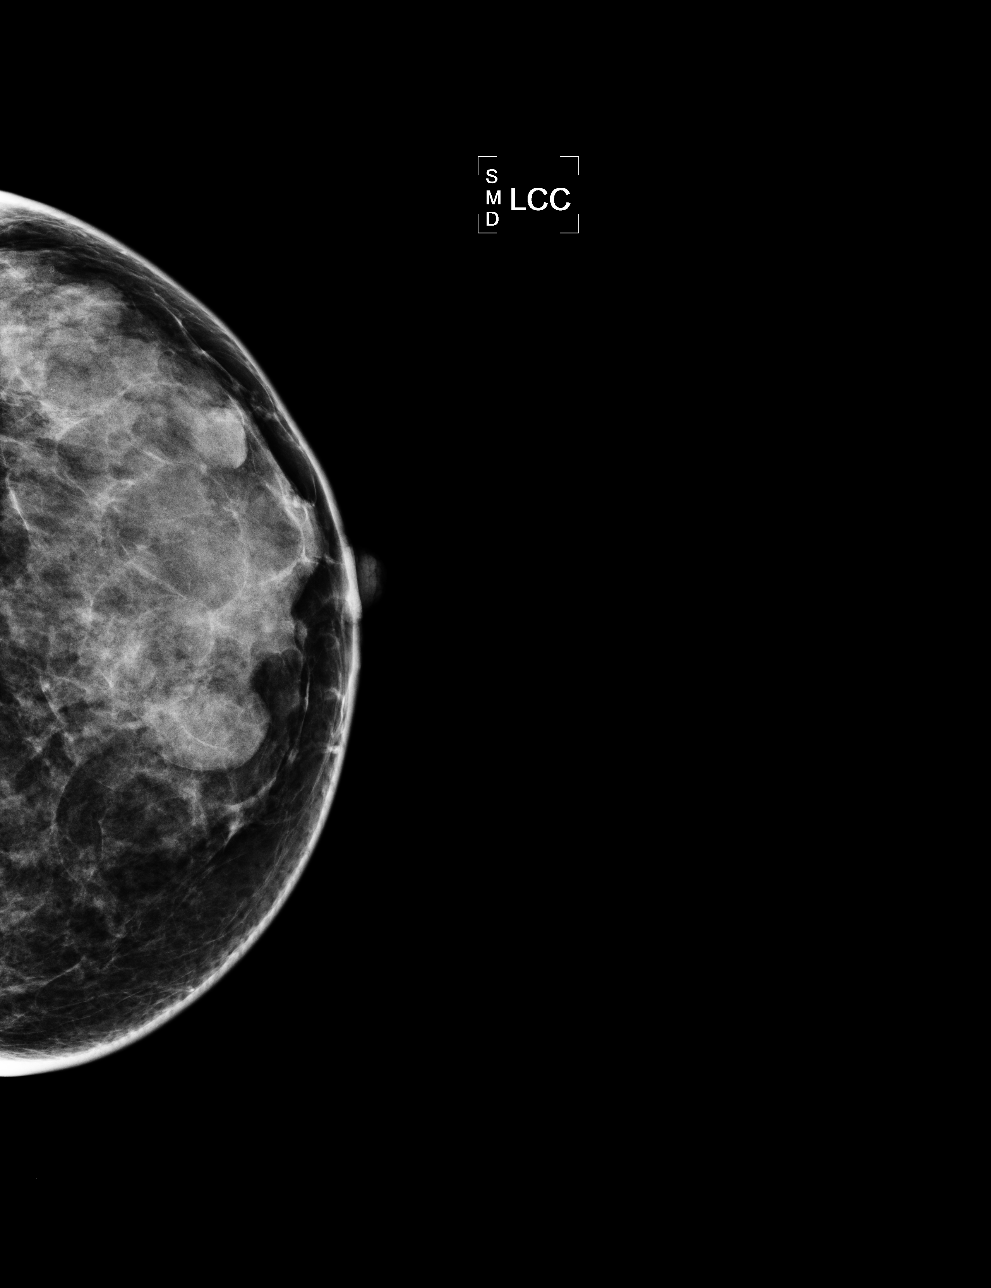

[L MLO]
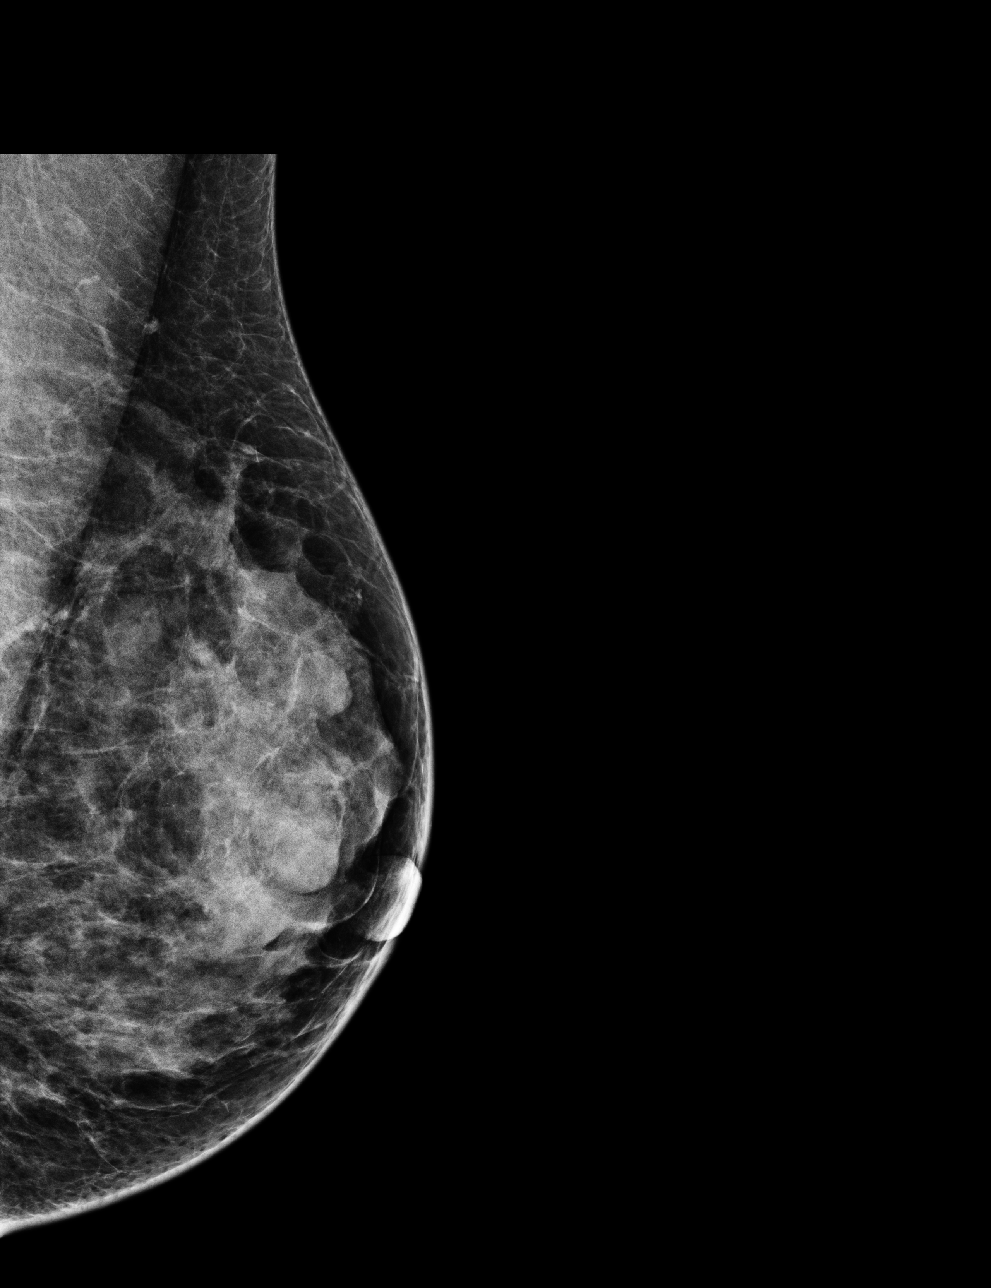

[R MLO]
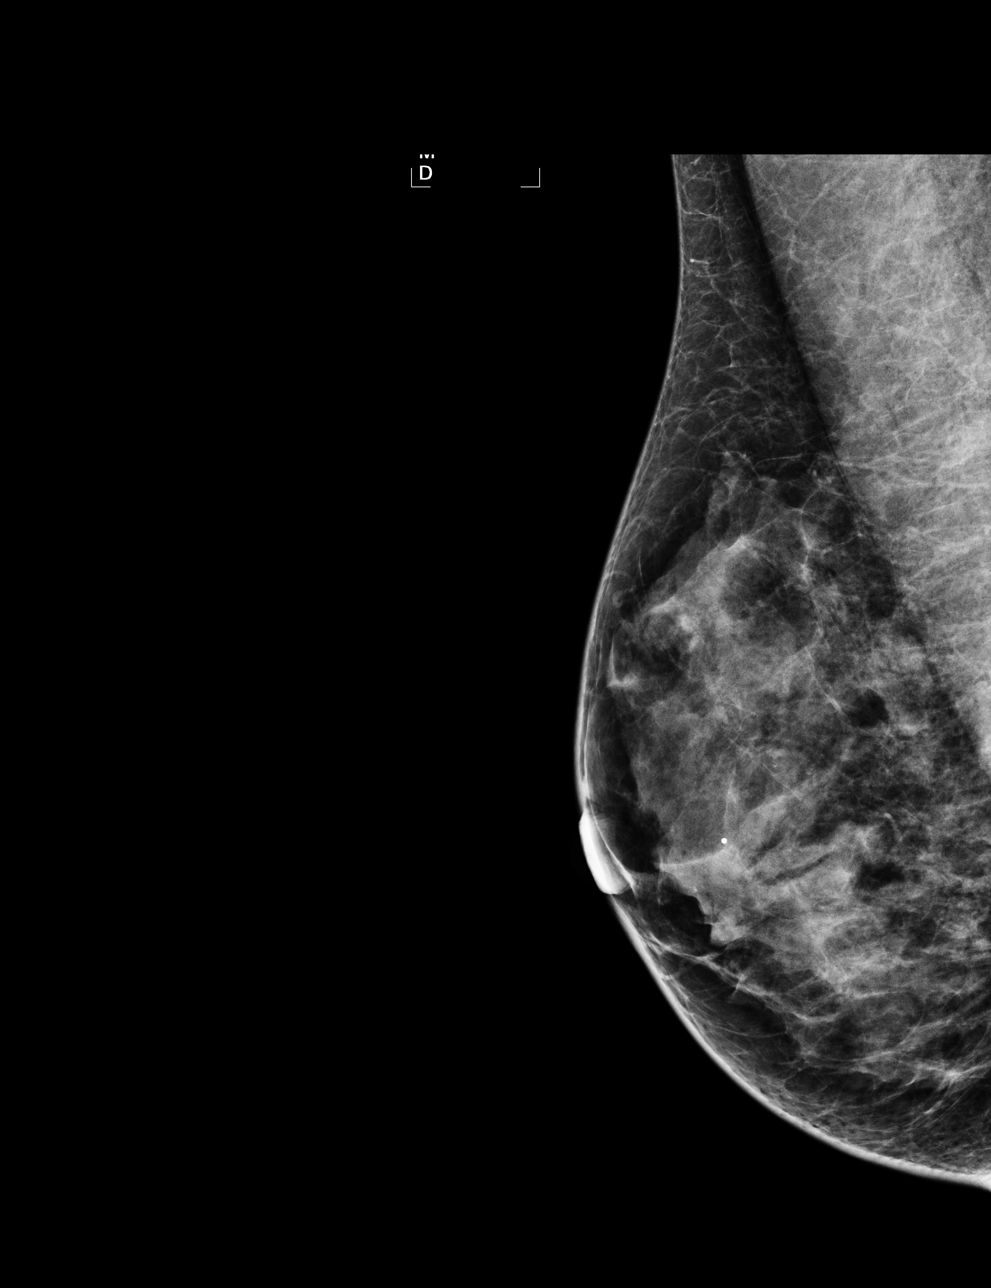

[4 of 4 positions shown; findings below may reference images not displayed]

FINDINGS: There is a dense fibroglandular pattern. No new masses
 or bleed type calcifications are seen. Again seen are multiple
 round and oval masses in the left breast with circumscribed
 margins. Compared with priors.
 Mammographic images were processed with CAD.

 On physical exam, I palpate a firm, mobile mass in the [DATE]
 position of the left breast, 2 cm from the nipple.

 Ultrasound is performed, showing three oval, hypoechoic masses with
 circumscribed margins the left breast as follows: 11 o'clock, 2 cm
 from the left nipple measuring 0.6 x 1.6 x 1.1 cm (previously 0.7 x
 1.6 x 1.0 cm), 130, 2 cm from the nipple measuring 0.7 x 0.9 x
 cm (previously 0.8 x 0.6 x 0.7 cm) and at 2 o'clock, 2 cm from the
 nipple measuring 1.3 x 1.0 x 0.7 cm (previously 1.8 x 1.8 x
 cm).
IMPRESSION: Fibroadenomas, left breast. No mammographic evidence of
 malignancy. Recommend routine screening mammography in 1 year.

 BI-RADS CATEGORY 2: Benign finding(s).

## 2011-04-12 ENCOUNTER — Other Ambulatory Visit: Payer: Self-pay | Admitting: Obstetrics and Gynecology

## 2011-04-12 DIAGNOSIS — Z1231 Encounter for screening mammogram for malignant neoplasm of breast: Secondary | ICD-10-CM

## 2011-05-23 ENCOUNTER — Ambulatory Visit
Admission: RE | Admit: 2011-05-23 | Discharge: 2011-05-23 | Disposition: A | Discharge status: Home / Self Care | Payer: BC Managed Care – PPO | Source: Ambulatory Visit | Attending: Obstetrics and Gynecology | Admitting: Obstetrics and Gynecology

## 2011-05-23 DIAGNOSIS — Z1231 Encounter for screening mammogram for malignant neoplasm of breast: Secondary | ICD-10-CM

## 2012-05-09 ENCOUNTER — Other Ambulatory Visit: Payer: Self-pay | Admitting: Obstetrics and Gynecology

## 2012-05-09 DIAGNOSIS — Z1231 Encounter for screening mammogram for malignant neoplasm of breast: Secondary | ICD-10-CM

## 2012-05-22 ENCOUNTER — Ambulatory Visit
Admission: RE | Admit: 2012-05-22 | Discharge: 2012-05-22 | Disposition: A | Discharge status: Home / Self Care | Payer: BC Managed Care – PPO | Source: Ambulatory Visit | Attending: Obstetrics and Gynecology | Admitting: Obstetrics and Gynecology

## 2012-05-22 DIAGNOSIS — Z1231 Encounter for screening mammogram for malignant neoplasm of breast: Secondary | ICD-10-CM

## 2013-07-16 ENCOUNTER — Other Ambulatory Visit: Payer: Self-pay

## 2013-07-16 DIAGNOSIS — Z1231 Encounter for screening mammogram for malignant neoplasm of breast: Secondary | ICD-10-CM

## 2013-08-08 ENCOUNTER — Ambulatory Visit
Admission: RE | Admit: 2013-08-08 | Discharge: 2013-08-08 | Disposition: A | Discharge status: Home / Self Care | Payer: BC Managed Care – PPO | Source: Ambulatory Visit

## 2013-08-08 DIAGNOSIS — Z1231 Encounter for screening mammogram for malignant neoplasm of breast: Secondary | ICD-10-CM

## 2013-08-08 IMAGING — MG STANDARD SCREENING - COMBO
8 series · 9 of 24 positions shown · non-contrast
Comparison: Previous exam(s).

CLINICAL DATA: Screening.

EXAM:
DIGITAL SCREENING BILATERAL MAMMOGRAM WITH CAD
DIGITAL BREAST TOMOSYNTHESIS
Digital breast tomosynthesis images are acquired in two projections.
These images are reviewed in combination with the digital mammogram,
confirming the findings below.

[R MLO]
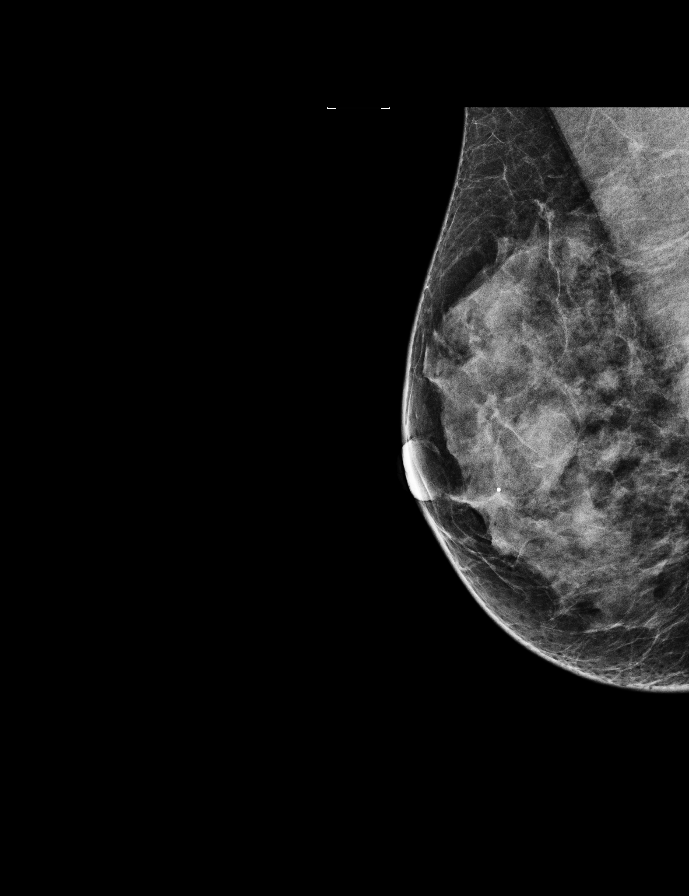

[L CC]
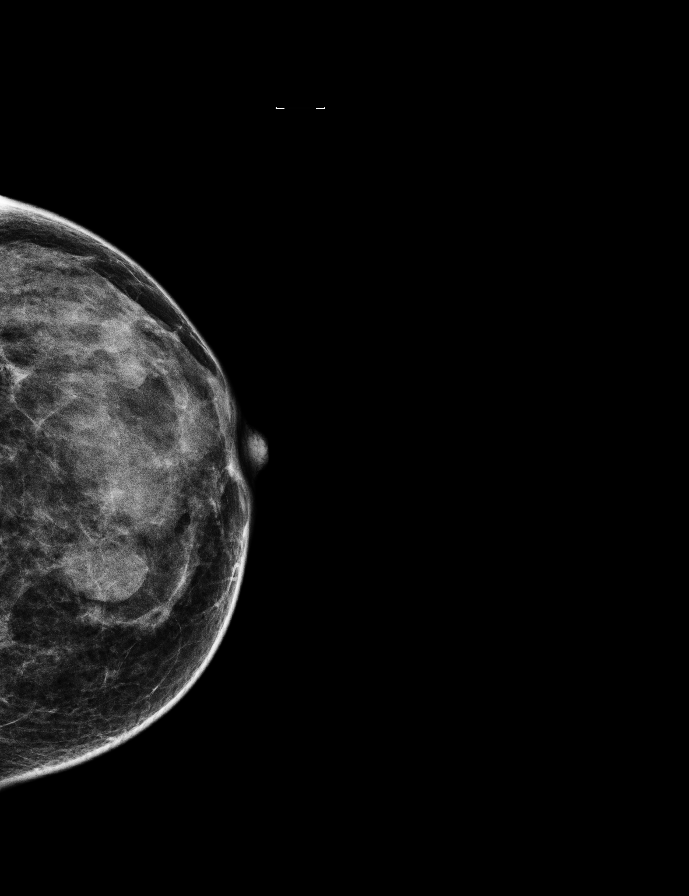

[L MLO]
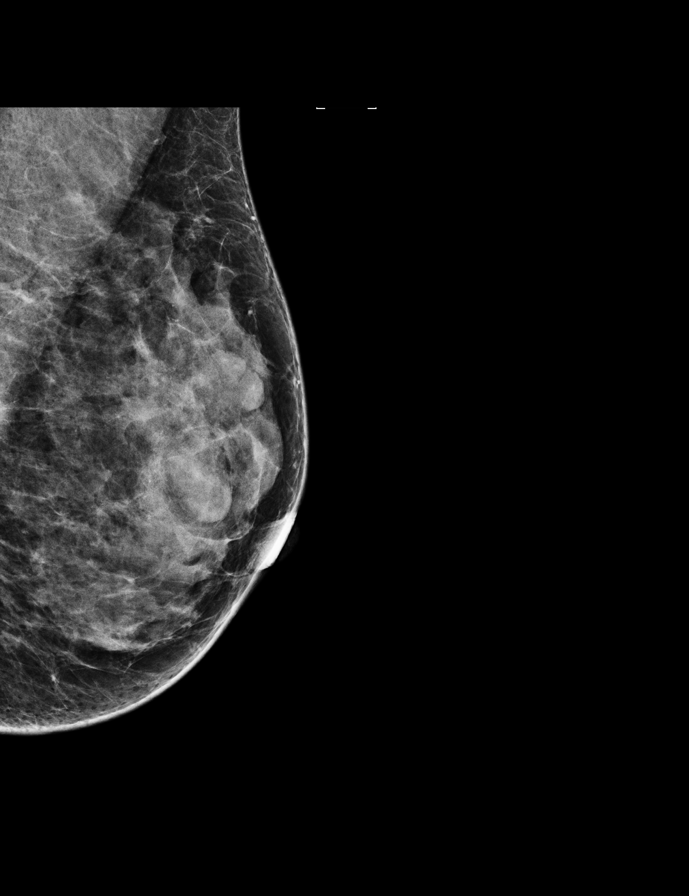

[R CC]
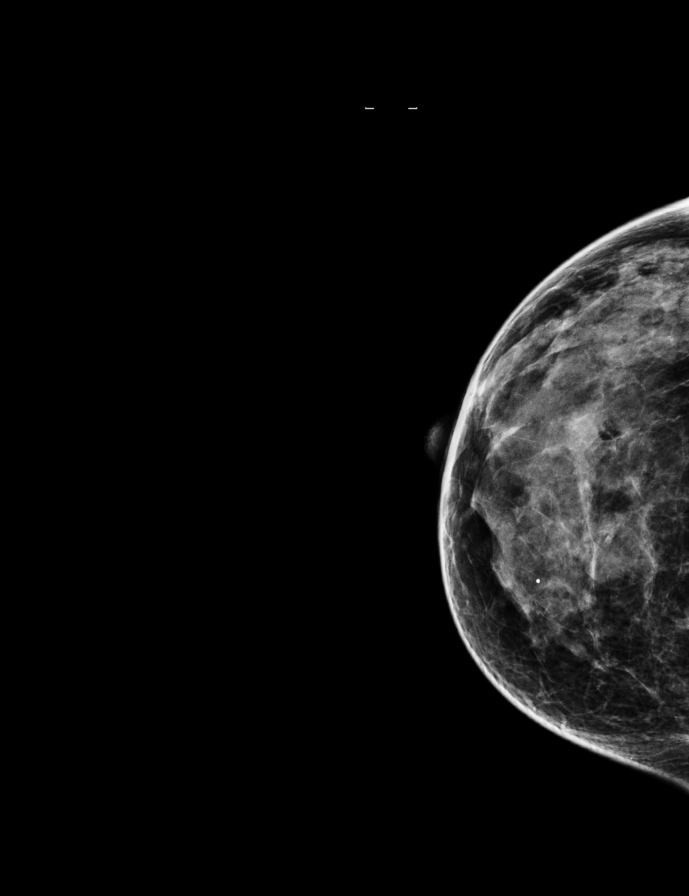

[R CC tomo · 2 of 38 frames shown]
[frame 13/38]
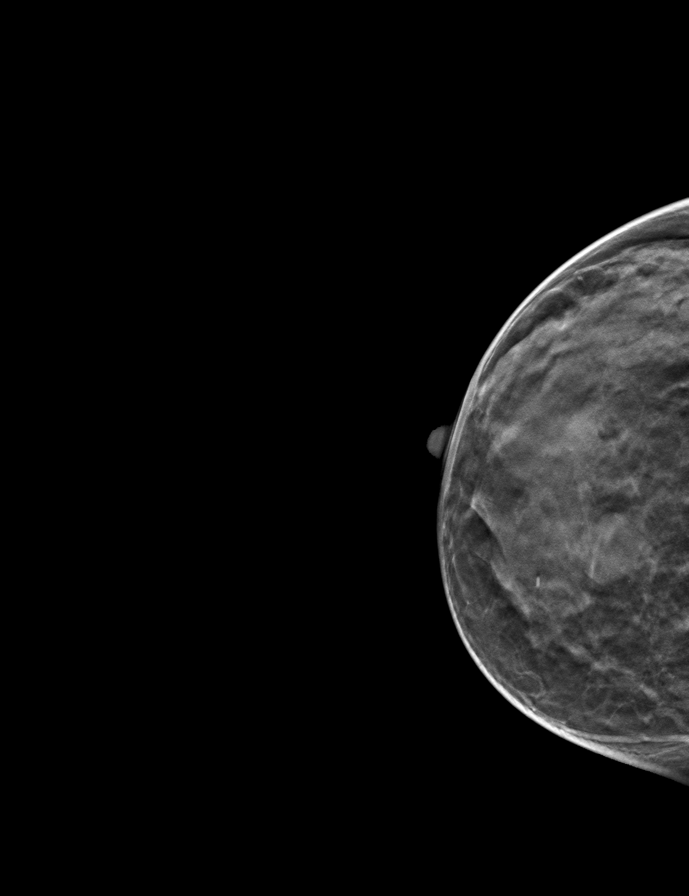
[frame 19/38]
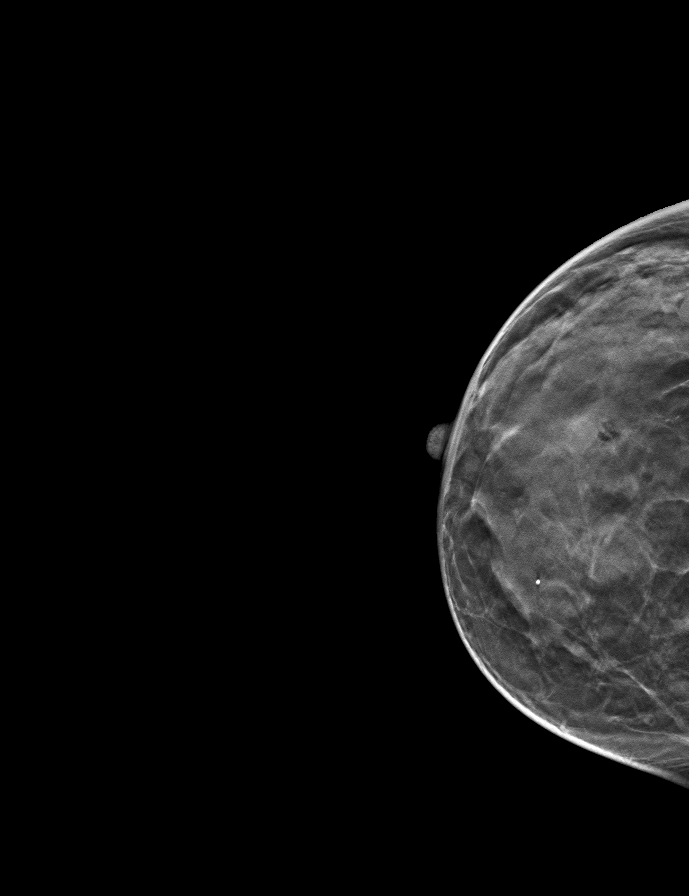

[R MLO tomo · tomo slice 19/38.0]
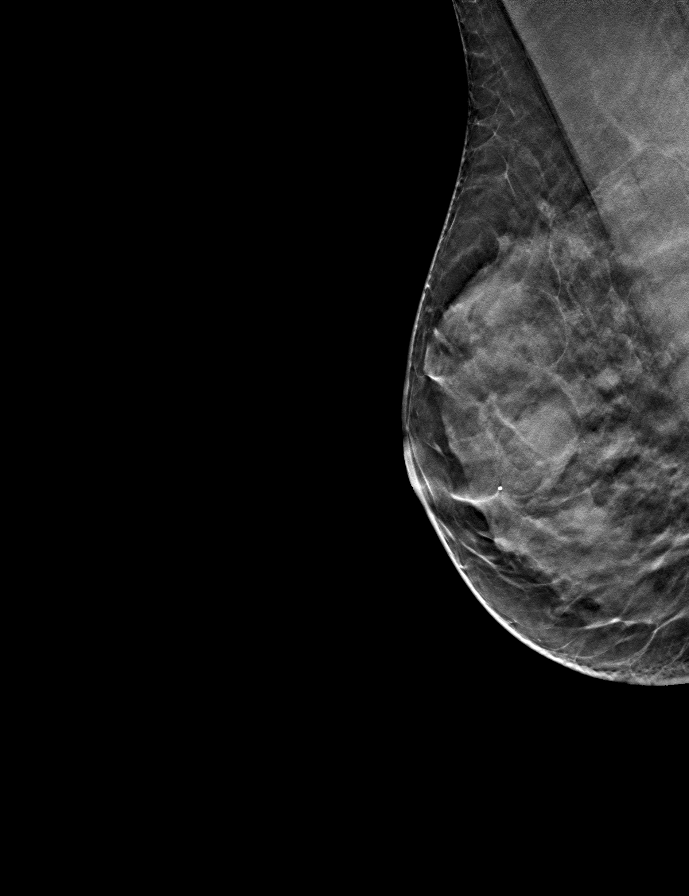

[L CC tomo · tomo slice 19/38.0]
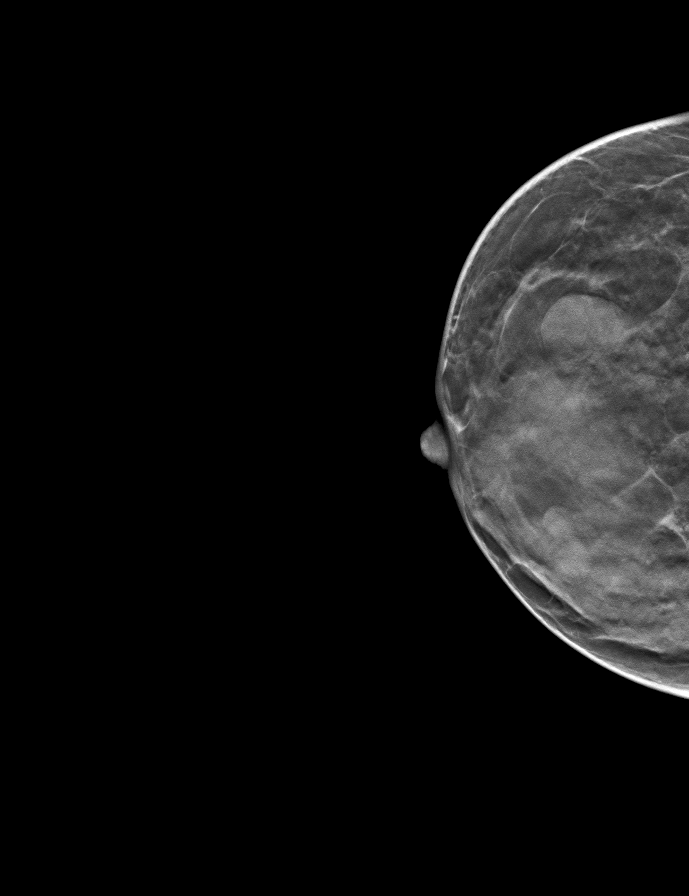

[L MLO tomo · tomo slice 19/38.0]
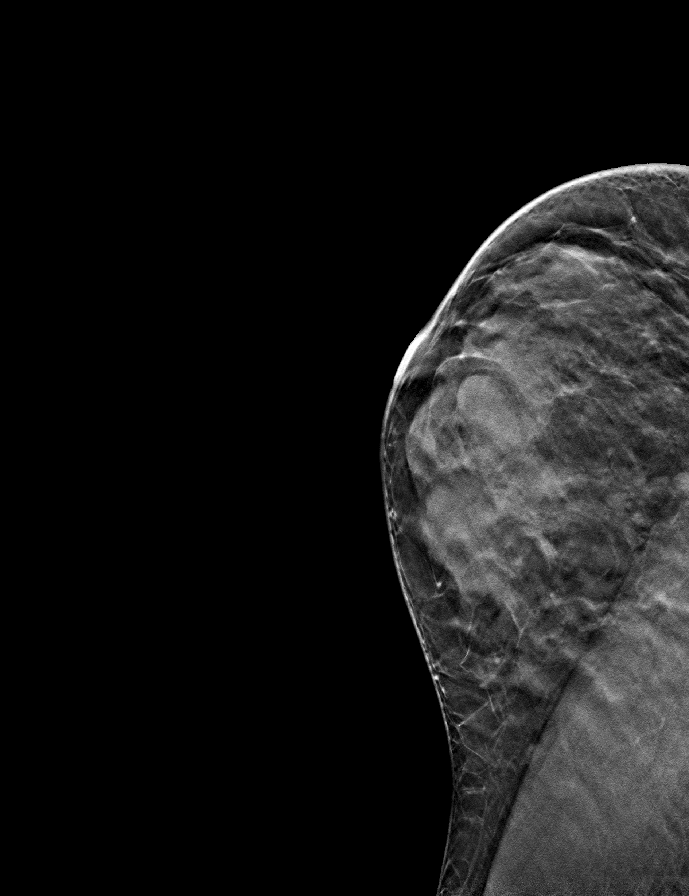

[9 of 24 positions shown; findings below may reference images not displayed]

ACR Breast Density Category d: The breasts are extremely dense,
which lowers the sensitivity of mammography.
FINDINGS: There are no findings suspicious for malignancy. Images were
processed with CAD.
IMPRESSION: No mammographic evidence of malignancy. A result letter of this
screening mammogram will be mailed directly to the patient.

RECOMMENDATION:
Screening mammogram in one year. (Code:[0N])

BI-RADS CATEGORY  1: Negative

## 2014-03-24 ENCOUNTER — Encounter (HOSPITAL_COMMUNITY): Payer: Self-pay | Admitting: *Deleted

## 2014-03-24 ENCOUNTER — Inpatient Hospital Stay (HOSPITAL_COMMUNITY)
Admission: AD | Admit: 2014-03-24 | Discharge: 2014-03-24 | Disposition: A | Discharge status: Home / Self Care | Payer: BC Managed Care – PPO | Source: Ambulatory Visit | Attending: Obstetrics & Gynecology | Admitting: Obstetrics & Gynecology

## 2014-03-24 DIAGNOSIS — B373 Candidiasis of vulva and vagina: Secondary | ICD-10-CM

## 2014-03-24 DIAGNOSIS — L293 Anogenital pruritus, unspecified: Secondary | ICD-10-CM | POA: Insufficient documentation

## 2014-03-24 DIAGNOSIS — B3731 Acute candidiasis of vulva and vagina: Secondary | ICD-10-CM | POA: Insufficient documentation

## 2014-03-24 HISTORY — DX: Anemia, unspecified: D64.9

## 2014-03-24 HISTORY — DX: Benign neoplasm of connective and other soft tissue, unspecified: D21.9

## 2014-03-24 LAB — URINALYSIS, ROUTINE W REFLEX MICROSCOPIC
BILIRUBIN URINE: NEGATIVE
Glucose, UA: NEGATIVE mg/dL
KETONES UR: NEGATIVE mg/dL
LEUKOCYTES UA: NEGATIVE
Nitrite: NEGATIVE
PROTEIN: NEGATIVE mg/dL
SPECIFIC GRAVITY, URINE: 1.01 (ref 1.005–1.030)
Urobilinogen, UA: 0.2 mg/dL (ref 0.0–1.0)
pH: 5.5 (ref 5.0–8.0)

## 2014-03-24 LAB — CBC
HCT: 31.8 % — ABNORMAL LOW (ref 36.0–46.0)
Hemoglobin: 10.1 g/dL — ABNORMAL LOW (ref 12.0–15.0)
MCH: 27.4 pg (ref 26.0–34.0)
MCHC: 31.8 g/dL (ref 30.0–36.0)
MCV: 86.4 fL (ref 78.0–100.0)
Platelets: 271 10*3/uL (ref 150–400)
RBC: 3.68 MIL/uL — ABNORMAL LOW (ref 3.87–5.11)
RDW: 15.4 % (ref 11.5–15.5)
WBC: 5.1 10*3/uL (ref 4.0–10.5)

## 2014-03-24 LAB — WET PREP, GENITAL
CLUE CELLS WET PREP: NONE SEEN
TRICH WET PREP: NONE SEEN

## 2014-03-24 LAB — URINE MICROSCOPIC-ADD ON

## 2014-03-24 LAB — HIV ANTIBODY (ROUTINE TESTING W REFLEX): HIV 1&2 Ab, 4th Generation: NONREACTIVE

## 2014-03-24 LAB — POCT PREGNANCY, URINE: PREG TEST UR: NEGATIVE

## 2014-03-24 MED ORDER — FLUCONAZOLE 150 MG PO TABS: 150.0000 mg | ORAL_TABLET | Freq: Every day | ORAL | Status: DC

## 2014-03-24 MED ORDER — FLUCONAZOLE 150 MG PO TABS
150.0000 mg | ORAL_TABLET | Freq: Once | ORAL | Status: AC
  Administered 2014-03-24: 150 mg via ORAL
  Filled 2014-03-24: qty 1

## 2014-03-24 NOTE — MAU Note (Addendum)
Had period 5/2, then started again 5/28 and has continued and is brown. No bleeding on arrival.  Also has itching and burning. Called Dr. Philis Pique and was told she was off today and patient felt like she needed to be seen.

## 2014-03-24 NOTE — MAU Provider Note (Signed)
History     CSN: [id number]  Arrival date and time: 03/24/14 1430   First Provider Initiated Contact with Patient 03/24/14 1450      No chief complaint on file.  HPI Ms. Valerie Richards is a 43 y.o. [id number] who presents to MAU today with complaint of itching, burning and thick, clumpy discharge with brown blood since 03/06/14. She denies UTI symptoms, abdominal pain or fever. She states LMP of 02/08/14.   OB History   Grav Para Term Preterm Abortions TAB SAB Ect Mult Living   5 5 5      1 5       Past Medical History  Diagnosis Date  . Anemia   . Fibroid     Past Surgical History  Procedure Laterality Date  . Tonsillectomy      History reviewed. No pertinent family history.  History  Substance Use Topics  . Smoking status: Never Smoker   . Smokeless tobacco: Never Used  . Alcohol Use: No    Allergies: No Known Allergies  Prescriptions prior to admission  Medication Sig Dispense Refill  . diphenhydrAMINE (BENADRYL) 50 MG capsule Take 50 mg by mouth at bedtime.      Marland Kitchen ibuprofen (ADVIL,MOTRIN) 800 MG tablet Take 800 mg by mouth every 8 (eight) hours as needed for headache.        Review of Systems  Constitutional: Negative for fever.  Gastrointestinal: Negative for abdominal pain.  Genitourinary: Negative for dysuria, urgency and frequency.       + vaginal bleeding, discharge  Skin: Positive for itching and rash.   Physical Exam   Blood pressure 101/58, pulse 73, temperature 98 F (36.7 C), temperature source Oral, resp. rate 18, height 5\' 5"  (1.651 m), weight 132 lb (59.875 kg), last menstrual period 02/08/2014.  Physical Exam  Constitutional: She is oriented to person, place, and time. She appears well-developed and well-nourished. No distress.  HENT:  Head: Normocephalic.  Cardiovascular: Normal rate.   Respiratory: Effort normal.  Genitourinary: There is no rash on the right labia. There is no rash on the left labia. Uterus is tender (very mild). Uterus  is not enlarged. Cervix exhibits no motion tenderness, no discharge and no friability. Right adnexum displays no mass and no tenderness. Left adnexum displays no mass and no tenderness. There is bleeding (scant brown blood noted) around the vagina. Vaginal discharge (small amount of thick, clump discharge) found.  Neurological: She is alert and oriented to person, place, and time.  Skin: Skin is warm and dry. No erythema.  Psychiatric: She has a normal mood and affect.   Results for orders placed during the hospital encounter of 03/24/14 (from the past 24 hour(s))  URINALYSIS, ROUTINE W REFLEX MICROSCOPIC     Status: Abnormal   Collection Time    03/24/14  2:00 PM      Result Value Ref Range   Color, Urine YELLOW  YELLOW   APPearance CLEAR  CLEAR   Specific Gravity, Urine 1.010  1.005 - 1.030   pH 5.5  5.0 - 8.0   Glucose, UA NEGATIVE  NEGATIVE mg/dL   Hgb urine dipstick MODERATE (*) NEGATIVE   Bilirubin Urine NEGATIVE  NEGATIVE   Ketones, ur NEGATIVE  NEGATIVE mg/dL   Protein, ur NEGATIVE  NEGATIVE mg/dL   Urobilinogen, UA 0.2  0.0 - 1.0 mg/dL   Nitrite NEGATIVE  NEGATIVE   Leukocytes, UA NEGATIVE  NEGATIVE  URINE MICROSCOPIC-ADD ON     Status: Abnormal  Collection Time    03/24/14  2:00 PM      Result Value Ref Range   Squamous Epithelial / LPF FEW (*) RARE   RBC / HPF 3-6  <3 RBC/hpf   Bacteria, UA RARE  RARE  POCT PREGNANCY, URINE     Status: None   Collection Time    03/24/14  2:44 PM      Result Value Ref Range   Preg Test, Ur NEGATIVE  NEGATIVE  CBC     Status: Abnormal   Collection Time    03/24/14  3:11 PM      Result Value Ref Range   WBC 5.1  4.0 - 10.5 K/uL   RBC 3.68 (*) 3.87 - 5.11 MIL/uL   Hemoglobin 10.1 (*) 12.0 - 15.0 g/dL   HCT 31.8 (*) 36.0 - 46.0 %   MCV 86.4  78.0 - 100.0 fL   MCH 27.4  26.0 - 34.0 pg   MCHC 31.8  30.0 - 36.0 g/dL   RDW 15.4  11.5 - 15.5 %   Platelets 271  150 - 400 K/uL  WET PREP, GENITAL     Status: Abnormal   Collection Time     03/24/14  3:26 PM      Result Value Ref Range   Yeast Wet Prep HPF POC MODERATE (*) NONE SEEN   Trich, Wet Prep NONE SEEN  NONE SEEN   Clue Cells Wet Prep HPF POC NONE SEEN  NONE SEEN   WBC, Wet Prep HPF POC MODERATE (*) NONE SEEN    MAU Course  Procedures None  MDM UPT - negative UA, wet prep, GC/Chlamydia, CBC and HIV today Discussed with Dr. Alwyn Pea. Ok to treat and follow-up PRN  Assessment and Plan  A: Yeast vulvovaginitis  P: Discharge home Rx for Diflucan given to patient Patient advised to follow-up with Dr. Philis Pique as needed or if symptoms persist or worsen Patient may return to MAU as needed or if her condition were to change or worsen   Farris Has, PA-C  03/24/2014, 3:57 PM

## 2014-03-24 NOTE — MAU Provider Note (Signed)
Reviewed case with PA agree with above  Valerie Richards [name]

## 2014-03-24 NOTE — Discharge Instructions (Signed)
Candidal Vulvovaginitis  Candidal vulvovaginitis is an infection of the vagina and vulva. The vulva is the skin around the opening of the vagina. This may cause itching and discomfort in and around the vagina.   HOME CARE  · Only take medicine as told by your doctor.  · Do not have sex (intercourse) until the infection is healed or as told by your doctor.  · Practice safe sex.  · Tell your sex partner about your infection.  · Do not douche or use tampons.  · Wear cotton underwear. Do not wear tight pants or panty hose.  · Eat yogurt. This may help treat and prevent yeast infections.  GET HELP RIGHT AWAY IF:   · You have a fever.  · Your problems get worse during treatment or do not get better in 3 days.  · You have discomfort, irritation, or itching in your vagina or vulva area.  · You have pain after sex.  · You start to get belly (abdominal) pain.  MAKE SURE YOU:  · Understand these instructions.  · Will watch your condition.  · Will get help right away if you are not doing well or get worse.  Document Released: 12/23/2008 Document Revised: 12/19/2011 Document Reviewed: 12/23/2008  [employer]® Patient Information ©2014 [employer].

## 2014-03-27 LAB — GC/CHLAMYDIA PROBE AMP
CT Probe RNA: NEGATIVE
GC Probe RNA: NEGATIVE

## 2014-07-17 ENCOUNTER — Other Ambulatory Visit: Payer: Self-pay

## 2014-07-17 DIAGNOSIS — Z1239 Encounter for other screening for malignant neoplasm of breast: Secondary | ICD-10-CM

## 2014-08-11 ENCOUNTER — Encounter (HOSPITAL_COMMUNITY): Payer: Self-pay | Admitting: *Deleted

## 2014-08-11 ENCOUNTER — Ambulatory Visit: Payer: BC Managed Care – PPO

## 2014-08-11 ENCOUNTER — Other Ambulatory Visit: Payer: Self-pay

## 2014-08-11 DIAGNOSIS — Z1231 Encounter for screening mammogram for malignant neoplasm of breast: Secondary | ICD-10-CM

## 2014-08-25 ENCOUNTER — Ambulatory Visit: Payer: BC Managed Care – PPO

## 2014-09-09 ENCOUNTER — Ambulatory Visit
Admission: RE | Admit: 2014-09-09 | Discharge: 2014-09-09 | Disposition: A | Discharge status: Home / Self Care | Payer: BC Managed Care – PPO | Source: Ambulatory Visit

## 2014-09-09 DIAGNOSIS — Z1231 Encounter for screening mammogram for malignant neoplasm of breast: Secondary | ICD-10-CM

## 2014-09-09 IMAGING — MG MM SCREEN MAMMOGRAM BILATERAL
4 series · 4 of 4 positions shown · non-contrast
Comparison: Previous exam(s).

CLINICAL DATA: Screening.

EXAM:
DIGITAL SCREENING BILATERAL MAMMOGRAM WITH CAD

[R CC]
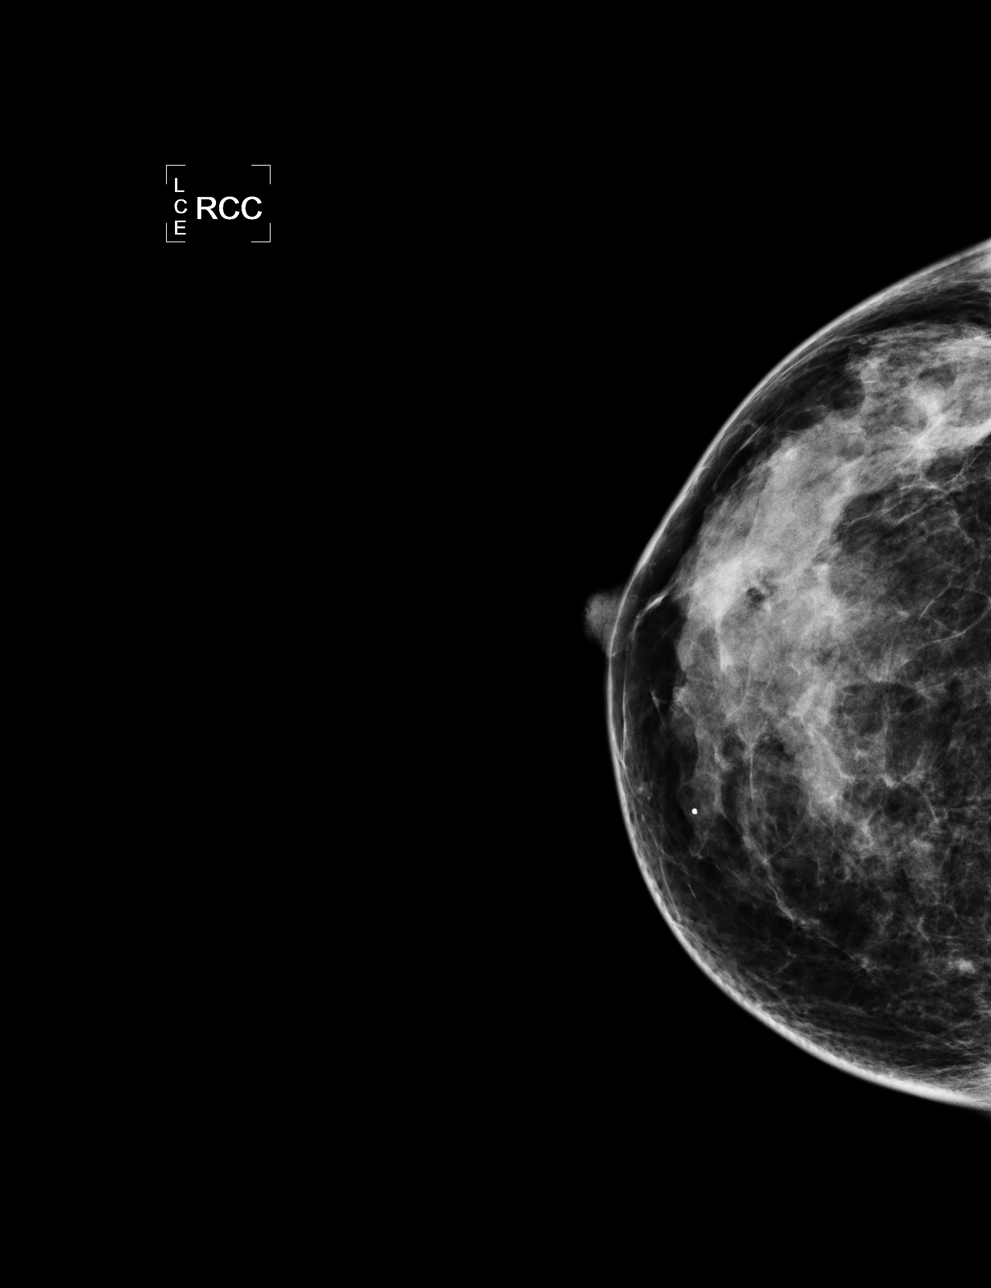

[L CC]
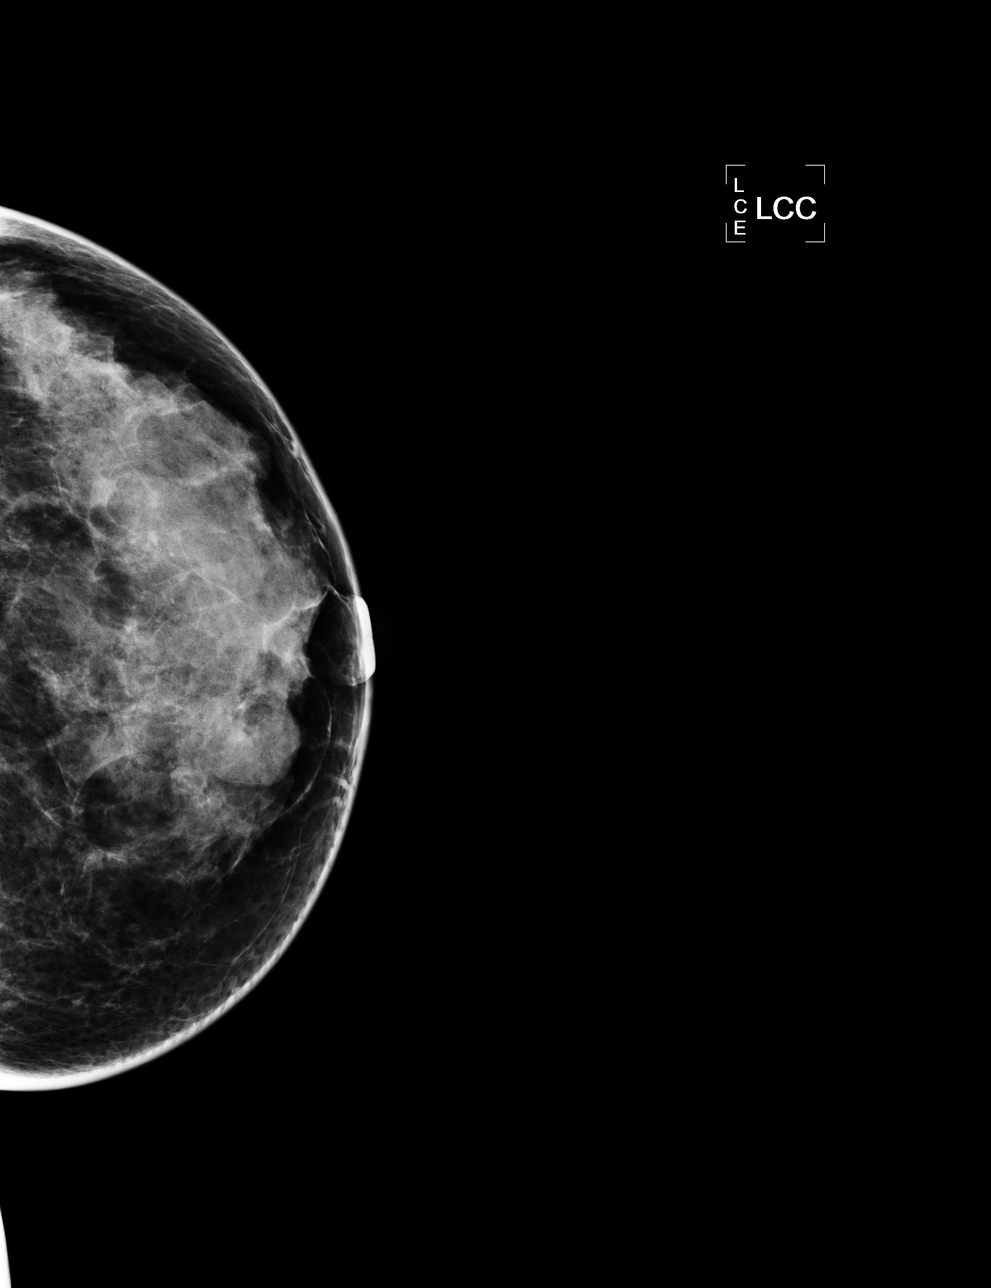

[L MLO]
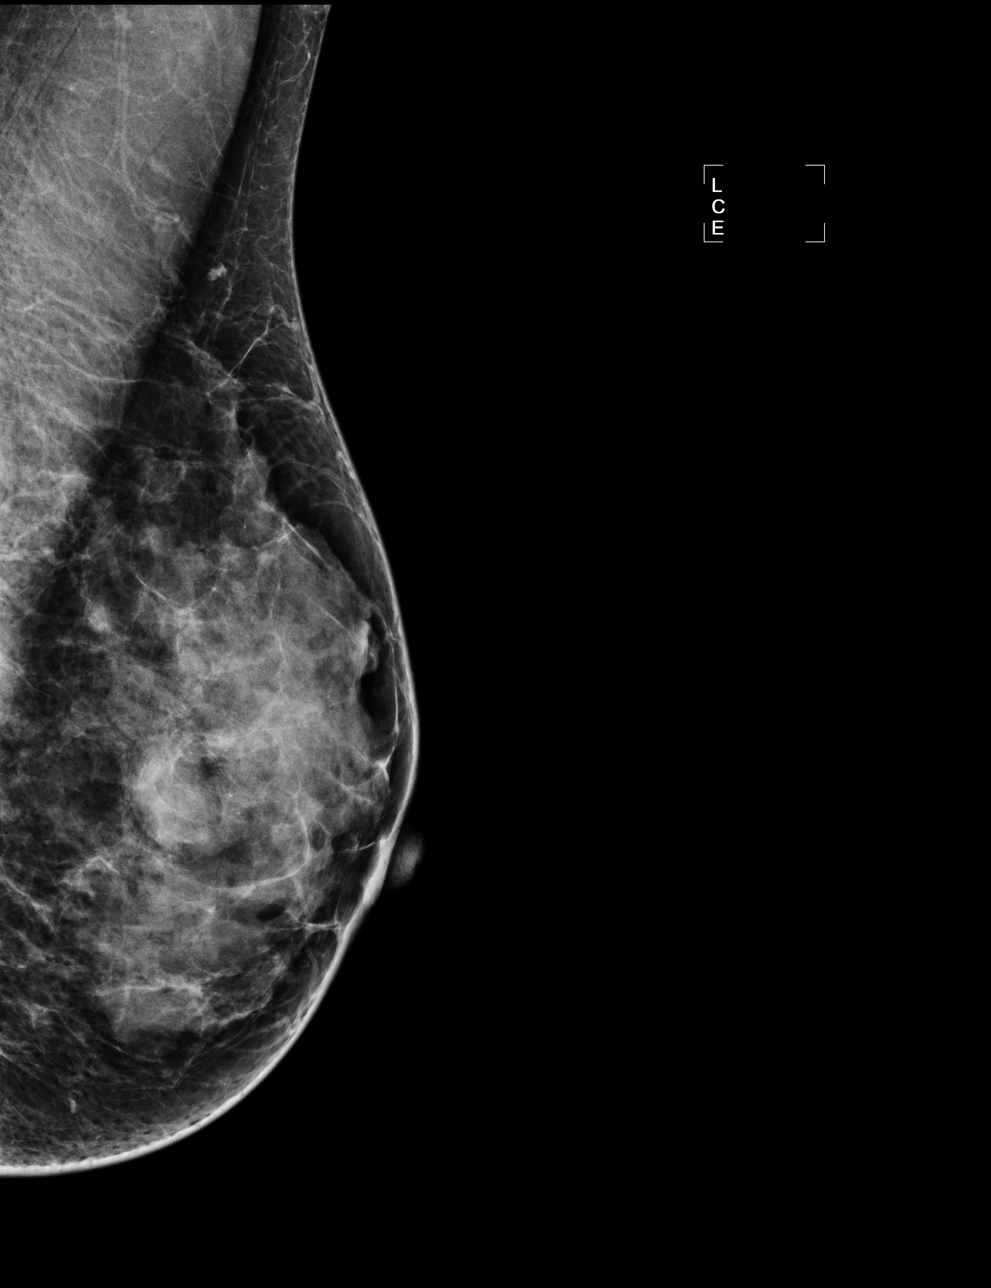

[R MLO]
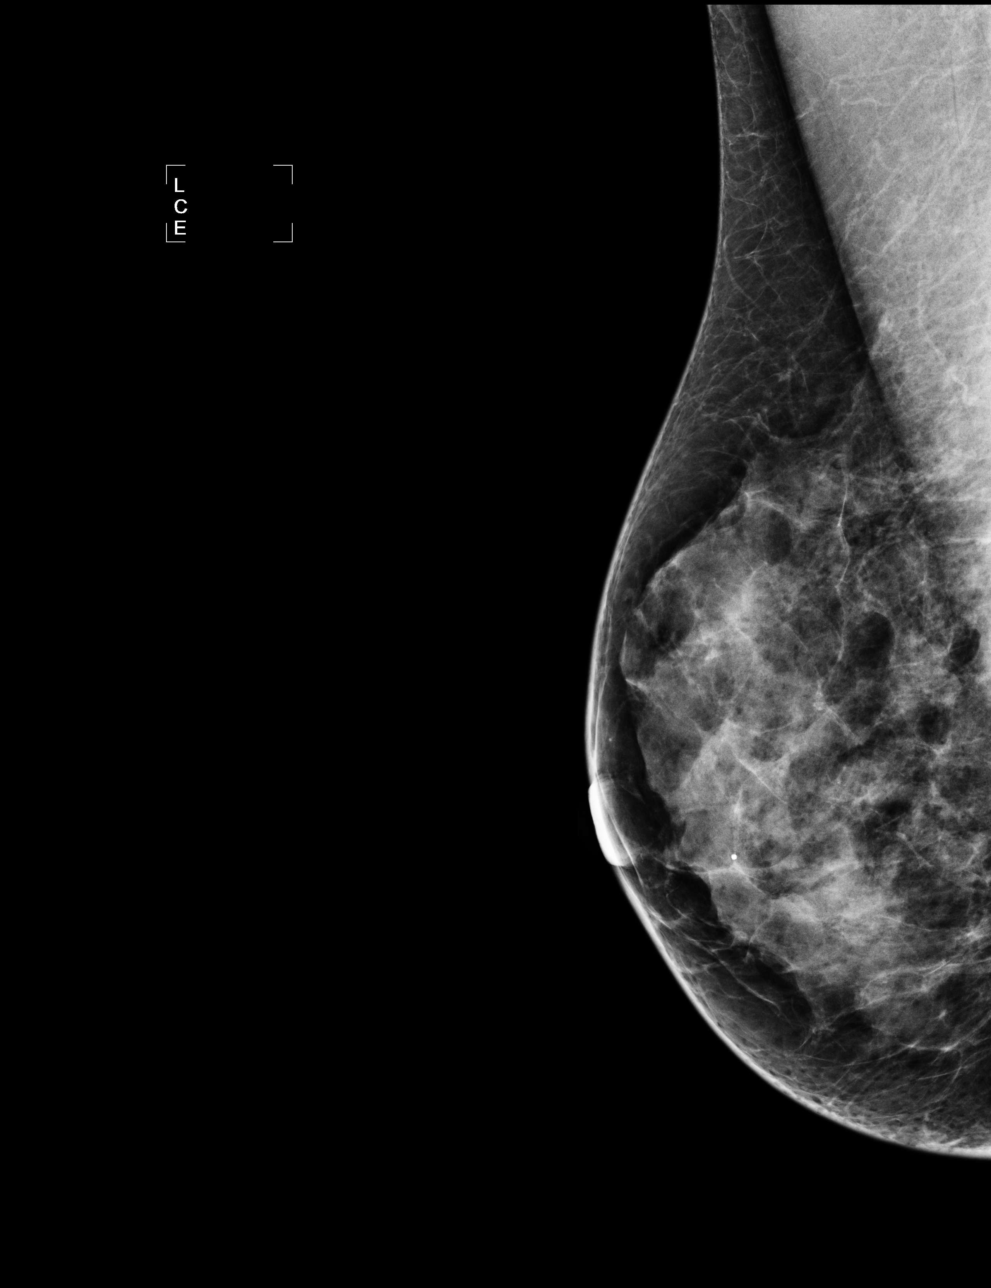

[4 of 4 positions shown; findings below may reference images not displayed]

ACR Breast Density Category c: The breast tissue is heterogeneously
dense, which may obscure small masses.
FINDINGS: There are no findings suspicious for malignancy. Images were
processed with CAD.
IMPRESSION: No mammographic evidence of malignancy. A result letter of this
screening mammogram will be mailed directly to the patient.

RECOMMENDATION:
Screening mammogram in one year. (Code:[0J])

BI-RADS CATEGORY  1: Negative.

## 2015-05-26 ENCOUNTER — Ambulatory Visit (INDEPENDENT_AMBULATORY_CARE_PROVIDER_SITE_OTHER): Payer: BC Managed Care – PPO | Admitting: Physician Assistant

## 2015-05-26 ENCOUNTER — Ambulatory Visit (INDEPENDENT_AMBULATORY_CARE_PROVIDER_SITE_OTHER): Payer: BC Managed Care – PPO

## 2015-05-26 VITALS — BP 110/70 | HR 69 | Temp 97.6°F | Resp 16 | Ht 66.0 in | Wt 133.0 lb

## 2015-05-26 DIAGNOSIS — R079 Chest pain, unspecified: Secondary | ICD-10-CM

## 2015-05-26 DIAGNOSIS — R0789 Other chest pain: Secondary | ICD-10-CM

## 2015-05-26 DIAGNOSIS — D649 Anemia, unspecified: Secondary | ICD-10-CM | POA: Diagnosis not present

## 2015-05-26 DIAGNOSIS — M79609 Pain in unspecified limb: Secondary | ICD-10-CM

## 2015-05-26 DIAGNOSIS — R071 Chest pain on breathing: Secondary | ICD-10-CM

## 2015-05-26 DIAGNOSIS — M79669 Pain in unspecified lower leg: Secondary | ICD-10-CM

## 2015-05-26 IMAGING — CR DG CHEST 2V
2 series · 2 of 2 positions shown · non-contrast
Comparison: None.

CLINICAL DATA: Left-sided chest pain for 2-3 days

EXAM:
CHEST  2 VIEW

[PA]
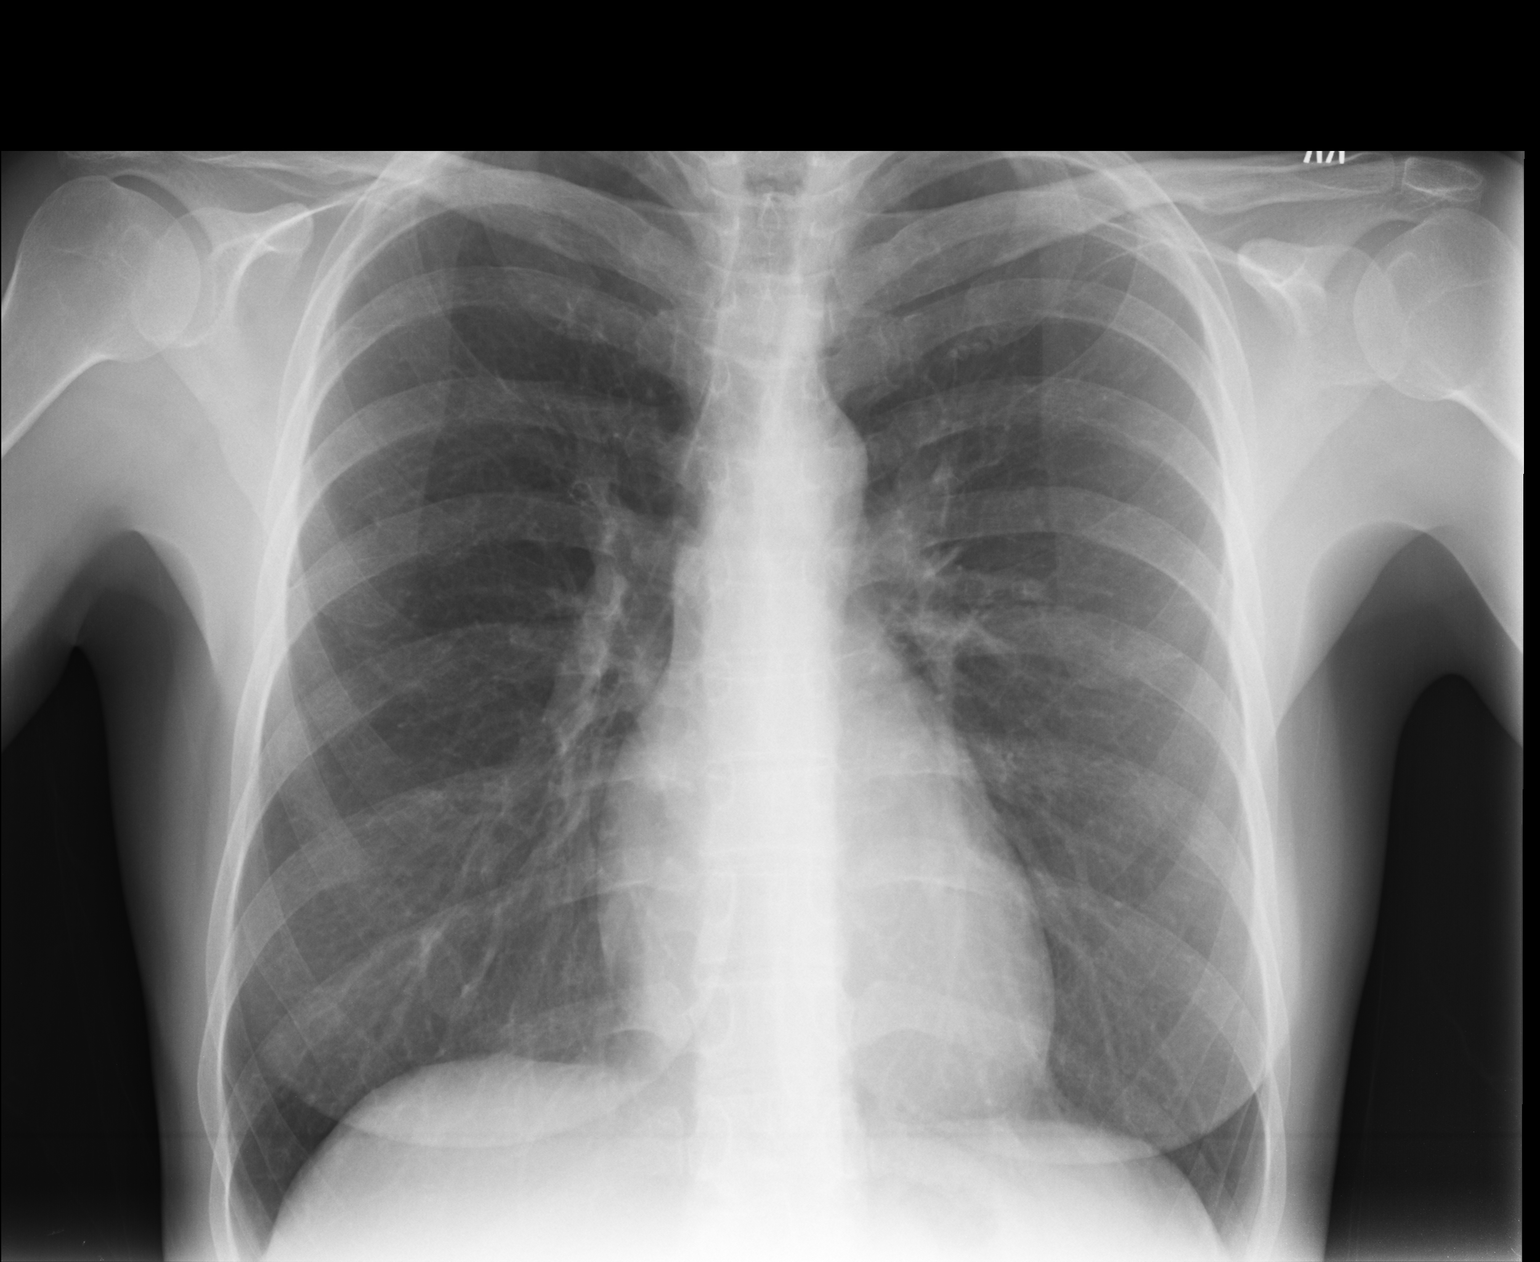

[lateral]
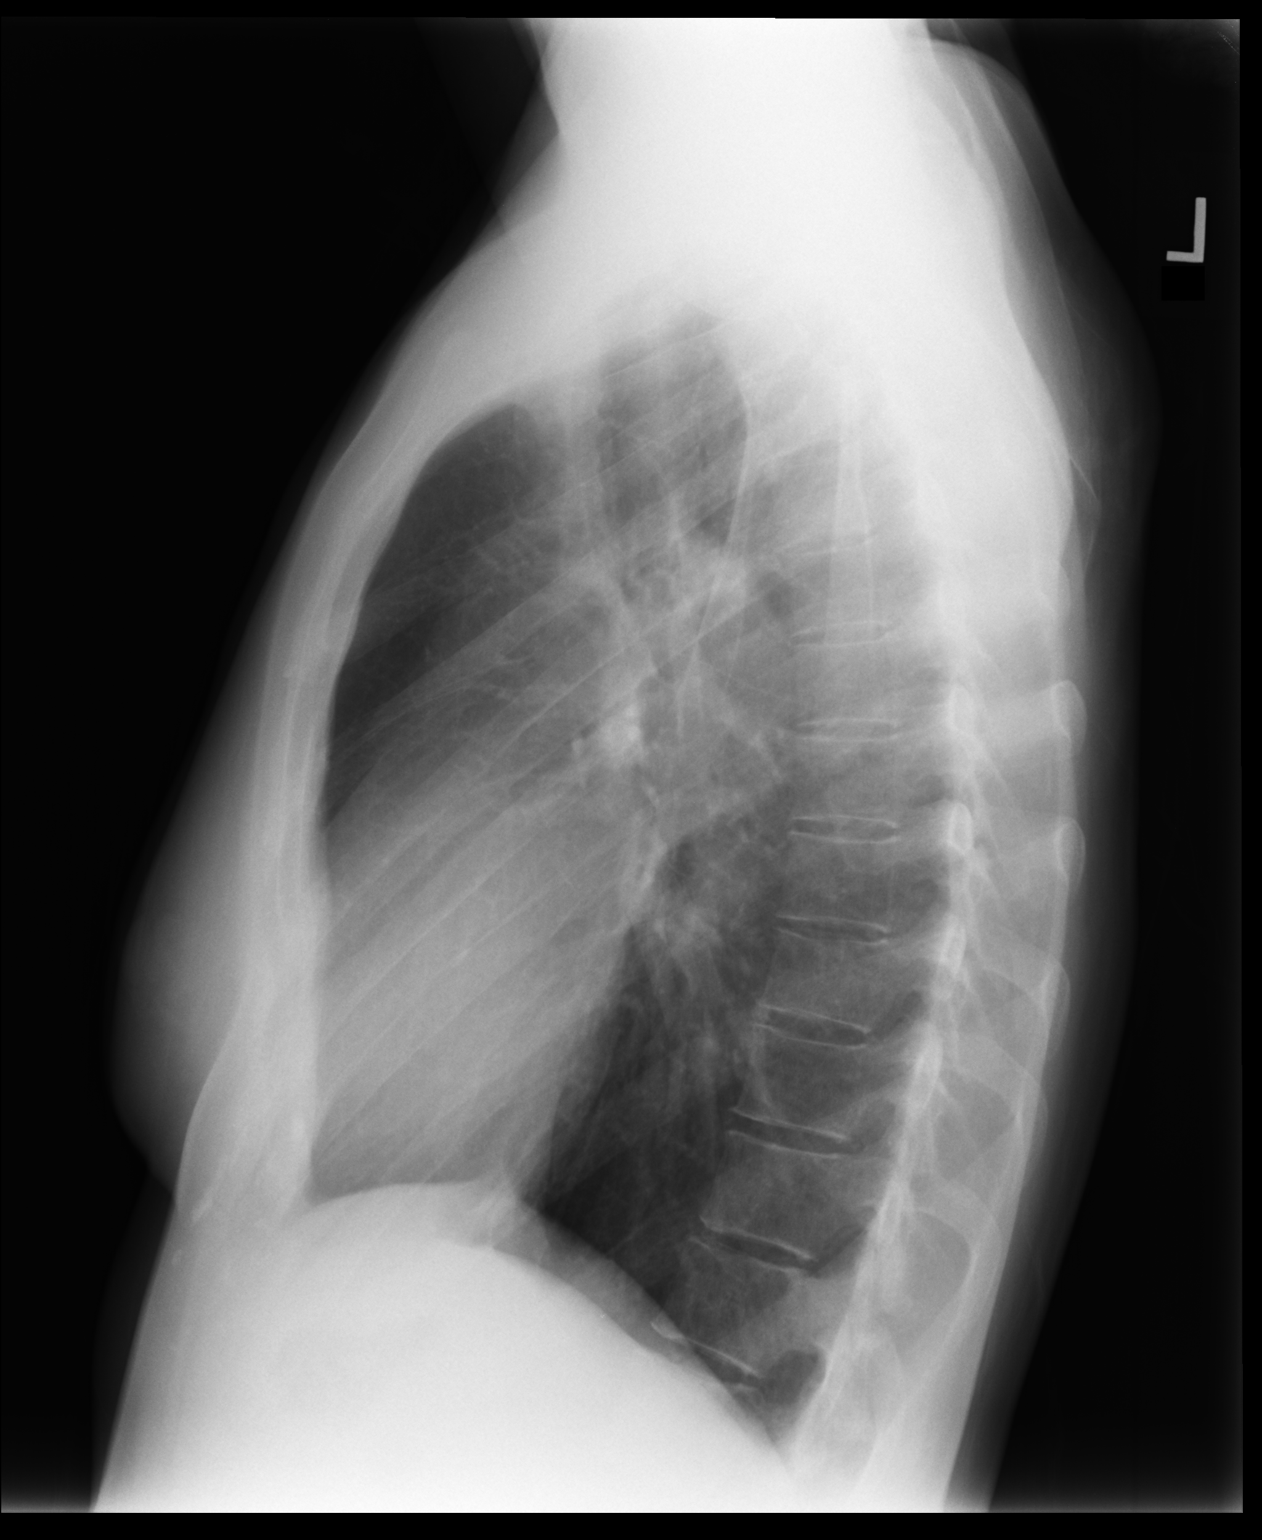

[2 of 2 positions shown; findings below may reference images not displayed]

FINDINGS: No active infiltrate or effusion is seen. The lungs are clear and
somewhat hyperaerated. Mediastinal and hilar contours are
unremarkable. The heart is within normal limits in size.
IMPRESSION: No active cardiopulmonary disease.

## 2015-05-26 MED ORDER — MELOXICAM 7.5 MG PO TABS: 7.5000 mg | ORAL_TABLET | Freq: Every day | ORAL | Status: DC

## 2015-05-26 NOTE — Progress Notes (Signed)
   Subjective:    Patient ID: Valerie Richards, female    DOB: 1971-07-25, 44 y.o.   MRN: [id number]  Chief Complaint  Patient presents with  . Chest Pain    x 3 days   Medications, allergies, past medical history, surgical history, family history, social history and problem list reviewed and updated.  HPI  5 yof presents with cp.   Started 2-3 days ago while she was walking down steps. Sudden onset sharp left sided. Resolved once finished with steps. Has recurred several times while going down steps, never up steps. No other aggravating factors. CP did reproduce in clinic when she touched her left chest. CP does not radiate. No assoc sob, palps, n/v, dizziness. She walks for exercise with no cp or unusual doe.   [place]. Denies fevers, chills, cough, abd pain, diaphoresis. No pleuritic cp. No cough. She does mention her left calf has been sore past week or so but that this has been off and on for several yrs. Not worse than normal. Calf pain comes on with walking.   Review of Systems See HPI.     Objective:   Physical Exam  Constitutional: She is oriented to person, place, and time. She appears well-developed and well-nourished.  Non-toxic appearance. She does not have a sickly appearance. She does not appear ill. No distress.  BP 110/70 mmHg  Pulse 69  Temp(Src) 97.6 F (36.4 C) (Oral)  Resp 16  Ht 5\' 6"  (1.676 m)  Wt 133 lb (60.328 kg)  BMI 21.48 kg/m2  SpO2 99%  LMP 05/17/2015   Cardiovascular: Normal rate, regular rhythm and normal heart sounds.  Exam reveals no gallop.   No murmur heard. Mild bilateral calf tenderness. Left calf 33cm, right calf 34cm. No erythema. No palpable cord.   Pulmonary/Chest: Effort normal and breath sounds normal. She exhibits tenderness.    CP reproducible with palpation over circled area.   Neurological: She is alert and oriented to person, place, and time.   EKG read by Dr Marin Comment. Findings: Normal  [facility]  reading (PRIMARY) by  Dr. Marin Comment. Chest findings: No acute findings.      Assessment & Plan:   Chest pain, unspecified chest pain type - Plan: EKG 12-Lead, DG Chest 2 View Calf tenderness Costochondral pain - Plan: meloxicam (MOBIC) 7.5 MG tablet --doubt cardiac etiology with no true exertional cp, normal ekg, normal cardiac exam, no cardiac risk factors --doubt pulm emb with negative PERC score (though she does have IUD), no pleuritic pain, no sob, calf pain is no diff than normal, normal vitals --cp is reproducible with palpation, pt did do some lifting while on vacation which is abnormal for her, likely muscle strain/costochondritis --mobic qd, ice area --er asap if cp worsens, sob comes on, or calf pain worsens beyond normal  Julieta Gutting, PA-C Physician Assistant-Certified [facility]  05/26/2015 1:10 PM

## 2015-05-26 NOTE — Patient Instructions (Addendum)
Your workup so for is normal. Your ekg and chest xray were normal.  Your pain is most likely from costochondritis as below. Please take the mobic once daily for 2 weeks. As we discussed, if you start having worsening chest pain, increasing shortness of breath, or worsening calf pain please go to the ER asap!   Costochondritis Costochondritis, sometimes called Tietze syndrome, is a swelling and irritation (inflammation) of the tissue (cartilage) that connects your ribs with your breastbone (sternum). It causes pain in the chest and rib area. Costochondritis usually goes away on its own over time. It can take up to 6 weeks or longer to get better, especially if you are unable to limit your activities. CAUSES  Some cases of costochondritis have no known cause. Possible causes include:  Injury (trauma).  Exercise or activity such as lifting.  Severe coughing. SIGNS AND SYMPTOMS  Pain and tenderness in the chest and rib area.  Pain that gets worse when coughing or taking deep breaths.  Pain that gets worse with specific movements. DIAGNOSIS  Your health care provider will do a physical exam and ask about your symptoms. Chest X-rays or other tests may be done to rule out other problems. TREATMENT  Costochondritis usually goes away on its own over time. Your health care provider may prescribe medicine to help relieve pain. HOME CARE INSTRUCTIONS   Avoid exhausting physical activity. Try not to strain your ribs during normal activity. This would include any activities using chest, abdominal, and side muscles, especially if heavy weights are used.  Apply ice to the affected area for the first 2 days after the pain begins.  Put ice in a plastic bag.  Place a towel between your skin and the bag.  Leave the ice on for 20 minutes, 2-3 times a day.  Only take over-the-counter or prescription medicines as directed by your health care provider. SEEK MEDICAL CARE IF:  You have redness or  swelling at the rib joints. These are signs of infection.  Your pain does not go away despite rest or medicine. SEEK IMMEDIATE MEDICAL CARE IF:   Your pain increases or you are very uncomfortable.  You have shortness of breath or difficulty breathing.  You cough up blood.  You have worse chest pains, sweating, or vomiting.  You have a fever or persistent symptoms for more than 2-3 days.  You have a fever and your symptoms suddenly get worse. MAKE SURE YOU:   Understand these instructions.  Will watch your condition.  Will get help right away if you are not doing well or get worse. Document Released: 07/06/2005 Document Revised: 07/17/2013 Document Reviewed: 04/30/2013 [facility] Patient Information 2015 [place], [place]. This information is not intended to replace advice given to you by your health care provider. Make sure you discuss any questions you have with your health care provider.

## 2015-08-03 ENCOUNTER — Other Ambulatory Visit: Payer: Self-pay

## 2015-08-03 DIAGNOSIS — Z1231 Encounter for screening mammogram for malignant neoplasm of breast: Secondary | ICD-10-CM

## 2015-09-11 ENCOUNTER — Ambulatory Visit
Admission: RE | Admit: 2015-09-11 | Discharge: 2015-09-11 | Disposition: A | Discharge status: Home / Self Care | Payer: BC Managed Care – PPO | Source: Ambulatory Visit

## 2015-09-11 DIAGNOSIS — Z1231 Encounter for screening mammogram for malignant neoplasm of breast: Secondary | ICD-10-CM

## 2015-09-11 IMAGING — MG MM DIGITAL SCREENING BILAT
4 series · 4 of 4 positions shown · non-contrast
Comparison: Previous exam(s).

CLINICAL DATA: Screening.

EXAM:
DIGITAL SCREENING BILATERAL MAMMOGRAM WITH CAD

[L MLO]
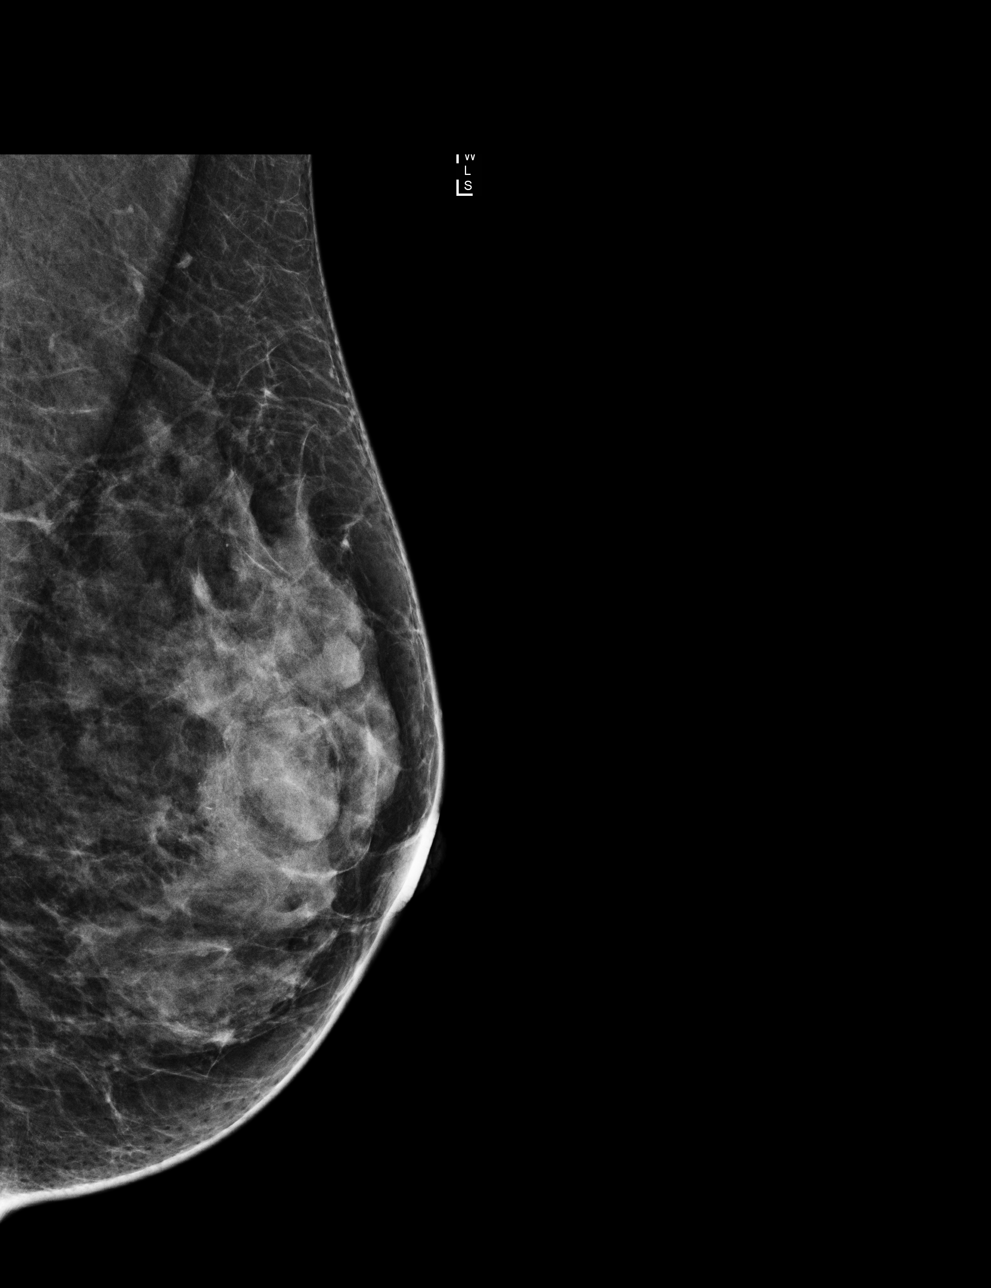

[R CC]
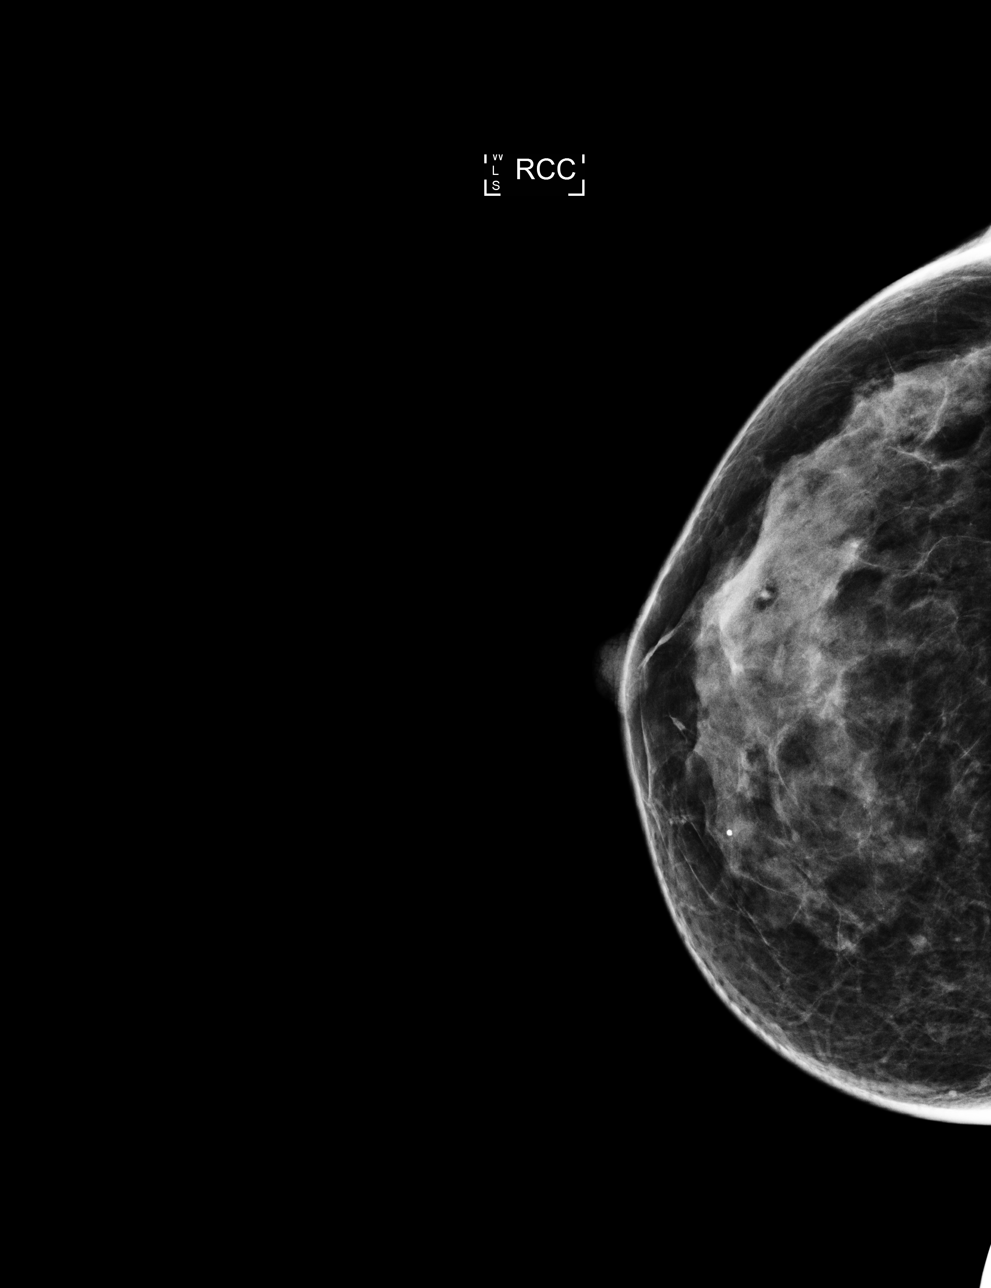

[R MLO]
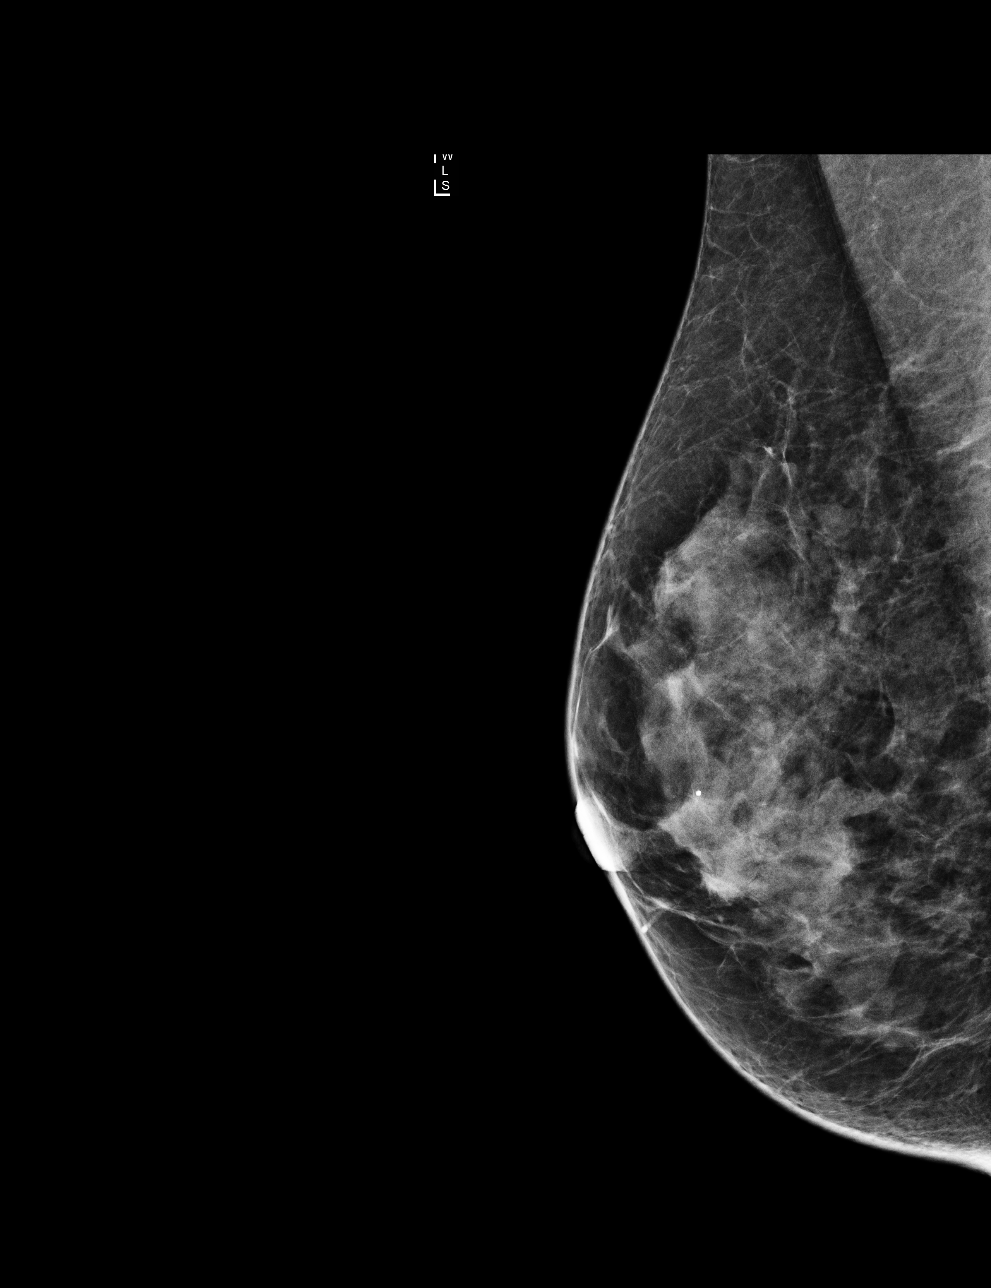

[L CC]
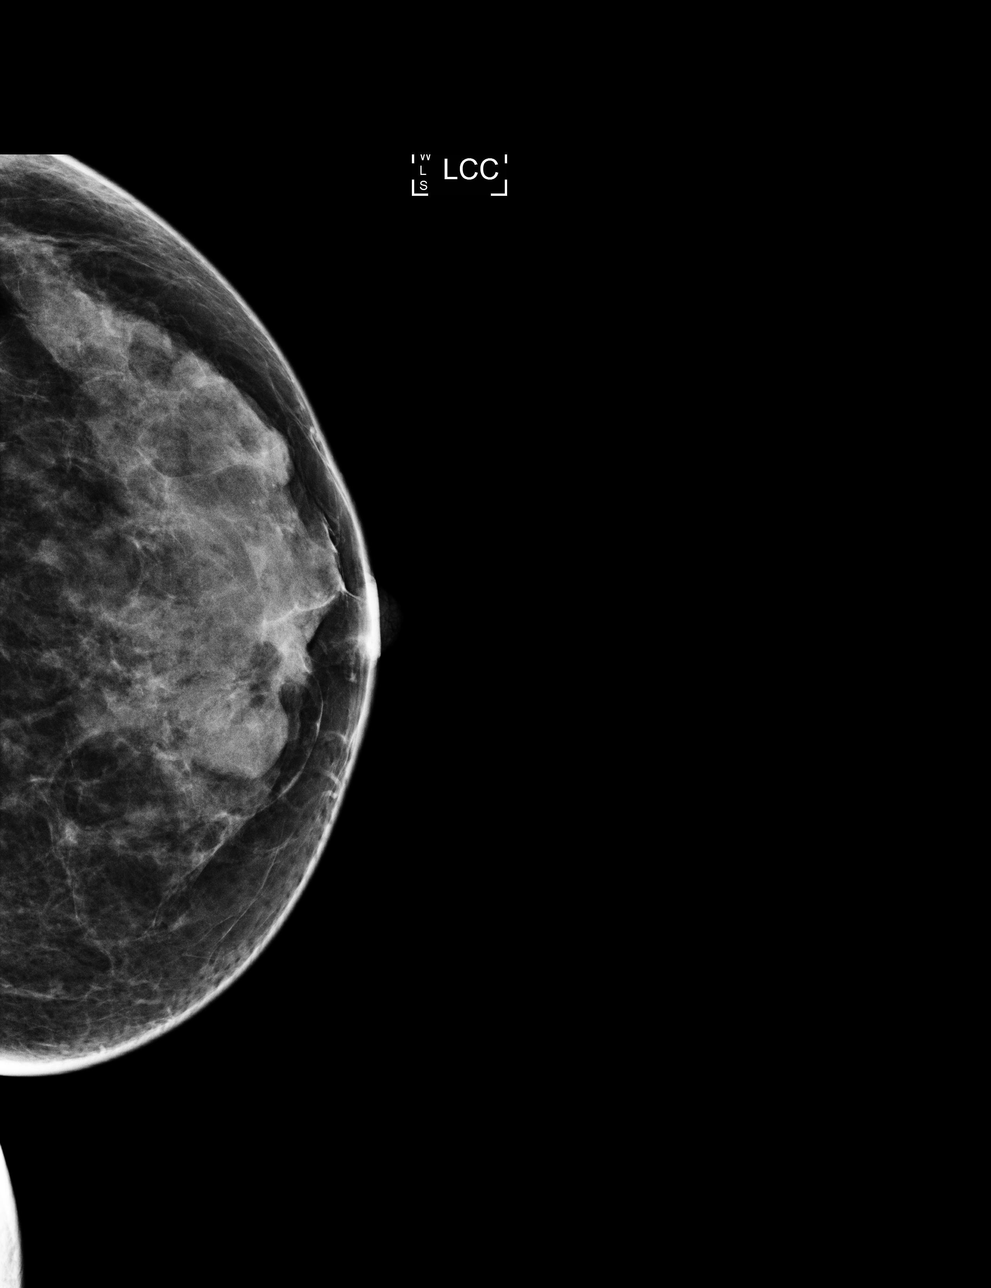

[4 of 4 positions shown; findings below may reference images not displayed]

ACR Breast Density Category c: The breast tissue is heterogeneously
dense, which may obscure small masses.
FINDINGS: There are no findings suspicious for malignancy. Images were
processed with CAD.
IMPRESSION: No mammographic evidence of malignancy. A result letter of this
screening mammogram will be mailed directly to the patient.

RECOMMENDATION:
Screening mammogram in one year. (Code:[0J])

BI-RADS CATEGORY  1: Negative.

## 2015-11-23 ENCOUNTER — Ambulatory Visit (INDEPENDENT_AMBULATORY_CARE_PROVIDER_SITE_OTHER): Payer: BC Managed Care – PPO | Admitting: Physician Assistant

## 2015-11-23 VITALS — BP 100/58 | HR 74 | Temp 98.7°F | Resp 16 | Ht 65.0 in | Wt 131.8 lb

## 2015-11-23 DIAGNOSIS — M546 Pain in thoracic spine: Secondary | ICD-10-CM

## 2015-11-23 DIAGNOSIS — G479 Sleep disorder, unspecified: Secondary | ICD-10-CM

## 2015-11-23 DIAGNOSIS — R071 Chest pain on breathing: Secondary | ICD-10-CM | POA: Diagnosis not present

## 2015-11-23 DIAGNOSIS — H00016 Hordeolum externum left eye, unspecified eyelid: Secondary | ICD-10-CM | POA: Diagnosis not present

## 2015-11-23 DIAGNOSIS — M549 Dorsalgia, unspecified: Secondary | ICD-10-CM

## 2015-11-23 DIAGNOSIS — R0789 Other chest pain: Secondary | ICD-10-CM

## 2015-11-23 MED ORDER — TRAZODONE HCL 50 MG PO TABS: 25.0000 mg | ORAL_TABLET | Freq: Every evening | ORAL | Status: DC | PRN

## 2015-11-23 MED ORDER — MELOXICAM 15 MG PO TABS: 15.0000 mg | ORAL_TABLET | Freq: Every day | ORAL | Status: DC

## 2015-11-23 NOTE — Progress Notes (Signed)
   Subjective:    Patient ID: Valerie Richards, female    DOB: 1971-08-04, 45 y.o.   MRN: [id number]  HPI  Left facial pain few days,   left numbness in fingers/hand chronic, on and off,  drooping eye, needles sensation in L eye yesterday, burning, itchy, pain worse w/ touching touch 7 not touching 2,  jaw pain/TMJ pain w/ eating Headache?  Sharp chest pain left  On an off since since September/oct random, no triggers 7/10 when feeling Right sided neck, shoulder, upper back pain not radiating 8/10,   sams brand otc sleeping aid everyday Xanax prn at night  Tylenol, ibuprofen Review of Systems  Constitutional: Positive for chills and fatigue. Negative for fever.  HENT: Positive for ear pain (mild, intermittent) and trouble swallowing (At night). Negative for hearing loss, rhinorrhea and sinus pressure. Tinnitus: mild, occasional.   Eyes: Positive for pain (Left, needle sharp) and itching (Left). Negative for discharge and redness.  Respiratory: Negative for chest tightness and shortness of breath.   Cardiovascular: Positive for chest pain.  Gastrointestinal: Negative for nausea, vomiting, diarrhea and constipation.  Musculoskeletal: Positive for neck pain.  Neurological: Positive for dizziness, facial asymmetry and numbness (Left hand and fingers).       Objective:   Physical Exam  Constitutional: She is oriented to person, place, and time. She appears well-developed and well-nourished.  HENT:  Head: Normocephalic and atraumatic.  Eyes: EOM are normal. Pupils are equal, round, and reactive to light. Right conjunctiva is not injected. Left conjunctiva is not injected.    Stye visible on left inner, lower eyelid   Neck: Neck supple.  Cardiovascular: Normal rate, regular rhythm, S1 normal and S2 normal.  Exam reveals no gallop and no friction rub.   No murmur heard. Pulses:      Dorsalis pedis pulses are 2+ on the right side, and 2+ on the left side.  Pulmonary/Chest: Effort  normal and breath sounds normal. No respiratory distress. She has no wheezes. She has no rales. She exhibits no tenderness.  Abdominal: Soft. Bowel sounds are normal. She exhibits no distension. There is no tenderness.  Neurological: She is alert and oriented to person, place, and time.  Skin: Skin is warm and dry.       Visual Acuity Screening   Right eye Left eye Both eyes  Without correction: 20/40 20/25 20/20   With correction:            Assessment & Plan:

## 2015-11-23 NOTE — Progress Notes (Signed)
Patient ID: Valerie Richards, female    DOB: [birth year], 45 y.o.   MRN: [id number]  PCP: No primary care provider on file.  Subjective:   Chief Complaint  Patient presents with  . left eye pain    x 2 mos, pain started 11/22/15  . jaw pain    on/off x 1 year  . depression scale during triage    score 9, pts "husband passed away x 2 wks ago"    HPI Presents for evaluation of several issues.  She describes pain under the LEFT eye intermittently x several days. 2/10 pain. Tender when she pushes on it (7/10), sometimes a pins and needles sensation in the lower lid. She notes some burning and itching sensation.  She's had jaw pain intermittently x 1 year. Notes it when she is eating, just in front of the ears bilaterally. Sometimes associated with headache.  Sharp chest pain since August. Note from that visit reviewed. EKG and CXR were normal. She was prescribed meloxicam for costochondritis, but never took it.   Also several weeks of pain above her RIGHT shoulder that extends somewhat into the RIGHT neck. No arm pain but she has intermittent tingling in the LEFT hand and fingers. No weakness.   Sleep is a problem. She is using an OTC sleep aid every night, and sometimes alprazolam.  Sometimes she awakens during the night and feels like her throat is closed. It feels dry. She forces herself to swallow, and then the sensation resolves. She doesn't try to drink any water when this happens. It's not associated with nausea, heartburn or chest pain.  Her husband died 2 weeks ago 9 years after diagnosis of Leverne Humbles Disease. Their children are 21 and 23 and were there with her at his death. They are all grieving considerably, and realizing that he was truly the central figure in their family, and despite his illness, still made most of the decisions, gave directions and managed the utilities. As a family they are struggling to discover and handle the things he did previously. She has sought no  counseling, despite having access to Hospice during the end of his life.      Review of Systems Constitutional: Positive for chills and fatigue. Negative for fever.  HENT: Positive for ear pain (mild, intermittent) and trouble swallowing (At night). Negative for hearing loss, rhinorrhea and sinus pressure. Tinnitus: mild, occasional.  Eyes: Positive for pain (Left, needle sharp) and itching (Left). Negative for discharge and redness.  Respiratory: Negative for chest tightness and shortness of breath.  Cardiovascular: Positive for chest pain.  Gastrointestinal: Negative for nausea, vomiting, diarrhea and constipation.  Musculoskeletal: Positive for neck pain.  Neurological: Positive for dizziness, facial asymmetry and numbness (Left hand and fingers).     Patient Active Problem List   Diagnosis Date Noted  . Anemia 05/26/2015     Prior to Admission medications   Medication Sig Start Date End Date Taking? Authorizing Provider  diphenhydrAMINE (BENADRYL) 50 MG capsule Take 50 mg by mouth at bedtime.   Yes Historical Provider, MD  ibuprofen (ADVIL,MOTRIN) 800 MG tablet Take 800 mg by mouth every 8 (eight) hours as needed for headache. Reported on 11/23/2015    Historical Provider, MD  meloxicam (MOBIC) 7.5 MG tablet Take 1 tablet (7.5 mg total) by mouth daily. Patient not taking: Reported on 11/23/2015 05/26/15   Araceli Bouche, PA     No Known Allergies     Objective:  Physical Exam  Constitutional:  She is oriented to person, place, and time. She appears well-developed and well-nourished. She is active and cooperative. No distress.  BP 100/58 mmHg  Pulse 74  Temp(Src) 98.7 F (37.1 C) (Oral)  Resp 16  Ht 5\' 5"  (1.651 m)  Wt 131 lb 12.8 oz (59.784 kg)  BMI 21.93 kg/m2  SpO2 98%  LMP 11/06/2015  HENT:  Head: Normocephalic and atraumatic.  Right Ear: Hearing normal.  Left Ear: Hearing normal.  Eyes: Conjunctivae and EOM are normal. Pupils are equal, round, and reactive to  light. Left eye exhibits hordeolum. No scleral icterus.    Neck: Normal range of motion. Neck supple. No thyromegaly present.  Cardiovascular: Normal rate, regular rhythm and normal heart sounds.   Pulses:      Radial pulses are 2+ on the right side, and 2+ on the left side.  Pulmonary/Chest: Effort normal and breath sounds normal.  Lymphadenopathy:       Head (right side): No tonsillar, no preauricular, no posterior auricular and no occipital adenopathy present.       Head (left side): No tonsillar, no preauricular, no posterior auricular and no occipital adenopathy present.    She has no cervical adenopathy.       Right: No supraclavicular adenopathy present.       Left: No supraclavicular adenopathy present.  Neurological: She is alert and oriented to person, place, and time. She has normal strength and normal reflexes. No cranial nerve deficit or sensory deficit. She exhibits normal muscle tone. Coordination and gait normal.  Skin: Skin is warm, dry and intact. No rash noted. No cyanosis or erythema. Nails show no clubbing.  Psychiatric: She has a normal mood and affect. Her speech is normal and behavior is normal. Depressed: cries intermittently, but is generally bright.           Assessment & Plan:   1. Stye, left Anticipatory guidance. Supportive care.  2. Upper back pain on right side 4. Costochondral pain Encouraged her to take the meloxicam-new Rx provided. I suspect that with improved sleep and with grief counseling, these symptoms will improve considerably. - meloxicam (MOBIC) 15 MG tablet; Take 1 tablet (15 mg total) by mouth daily.  Dispense: 30 tablet; Refill: 1  3. Sleep disturbance Stress reaction/grief. Encouraged her to seek grief counseling for herself and her family.  - traZODone (DESYREL) 50 MG tablet; Take 0.5-2 tablets (25-100 mg total) by mouth at bedtime as needed for sleep.  Dispense: 60 tablet; Refill: 1    Fara Chute, PA-C Physician  Assistant-Certified [facility]

## 2015-11-23 NOTE — Patient Instructions (Addendum)
Take the meloxicam for the muscle pain.  Take the trazodone for sleep. You can start with 1/2 tablet. If that doesn't help, increase to the whole tablet, then up to 2 tablets if needed.  Stye A stye is a bump on your eyelid caused by a bacterial infection. A stye can form inside the eyelid (internal stye) or outside the eyelid (external stye). An internal stye may be caused by an infected oil-producing gland inside your eyelid. An external stye may be caused by an infection at the base of your eyelash (hair follicle). Styes are very common. Anyone can get them at any age. They usually occur in just one eye, but you may have more than one in either eye.  CAUSES  The infection is almost always caused by bacteria called Staphylococcus aureus. This is a common type of bacteria that lives on your skin. RISK FACTORS You may be at higher risk for a stye if you have had one before. You may also be at higher risk if you have:  Diabetes.  Long-term illness.  Long-term eye redness.  A skin condition called seborrhea.  High fat levels in your blood (lipids). SIGNS AND SYMPTOMS  Eyelid pain is the most common symptom of a stye. Internal styes are more painful than external styes. Other signs and symptoms may include:  Painful swelling of your eyelid.  A scratchy feeling in your eye.  Tearing and redness of your eye.  Pus draining from the stye. DIAGNOSIS  Your health care provider may be able to diagnose a stye just by examining your eye. The health care provider may also check to make sure:  You do not have a fever or other signs of a more serious infection.  The infection has not spread to other parts of your eye or areas around your eye. TREATMENT  Most styes will clear up in a few days without treatment. In some cases, you may need to use antibiotic drops or ointment to prevent infection. Your health care provider may have to drain the stye surgically if your stye  is:  Large.  Causing a lot of pain.  Interfering with your vision. This can be done using a thin blade or a needle.  HOME CARE INSTRUCTIONS   Take medicines only as directed by your health care provider.  Apply a clean, warm compress to your eye for 10 minutes, 4 times a day.  Do not wear contact lenses or eye makeup until your stye has healed.  Do not try to pop or drain the stye. SEEK MEDICAL CARE IF:  You have chills or a fever.  Your stye does not go away after several days.  Your stye affects your vision.  Your eyeball becomes swollen, red, or painful. MAKE SURE YOU:  Understand these instructions.  Will watch your condition.  Will get help right away if you are not doing well or get worse.   This information is not intended to replace advice given to you by your health care provider. Make sure you discuss any questions you have with your health care provider.   Document Released: 07/06/2005 Document Revised: 10/17/2014 Document Reviewed: 01/10/2014 Elsevier Interactive Patient Education [employer].

## 2016-08-02 ENCOUNTER — Encounter (HOSPITAL_BASED_OUTPATIENT_CLINIC_OR_DEPARTMENT_OTHER): Payer: Self-pay | Admitting: *Deleted

## 2016-08-02 ENCOUNTER — Observation Stay (HOSPITAL_BASED_OUTPATIENT_CLINIC_OR_DEPARTMENT_OTHER)
Admission: EM | Admit: 2016-08-02 | Discharge: 2016-08-03 | Disposition: A | Discharge status: Home / Self Care | Payer: BC Managed Care – PPO | Attending: Internal Medicine | Admitting: Internal Medicine

## 2016-08-02 DIAGNOSIS — N920 Excessive and frequent menstruation with regular cycle: Secondary | ICD-10-CM | POA: Diagnosis not present

## 2016-08-02 DIAGNOSIS — D259 Leiomyoma of uterus, unspecified: Secondary | ICD-10-CM | POA: Diagnosis not present

## 2016-08-02 DIAGNOSIS — N939 Abnormal uterine and vaginal bleeding, unspecified: Secondary | ICD-10-CM | POA: Diagnosis present

## 2016-08-02 DIAGNOSIS — Z87891 Personal history of nicotine dependence: Secondary | ICD-10-CM | POA: Diagnosis not present

## 2016-08-02 DIAGNOSIS — D649 Anemia, unspecified: Secondary | ICD-10-CM | POA: Diagnosis not present

## 2016-08-02 DIAGNOSIS — D509 Iron deficiency anemia, unspecified: Secondary | ICD-10-CM | POA: Diagnosis not present

## 2016-08-02 LAB — RETICULOCYTES
RBC.: 2.94 MIL/uL — ABNORMAL LOW (ref 3.87–5.11)
RETIC COUNT ABSOLUTE: 50 10*3/uL (ref 19.0–186.0)
Retic Ct Pct: 1.7 % (ref 0.4–3.1)

## 2016-08-02 LAB — COMPREHENSIVE METABOLIC PANEL
ALBUMIN: 3.8 g/dL (ref 3.5–5.0)
ALT: 8 U/L — AB (ref 14–54)
AST: 18 U/L (ref 15–41)
Alkaline Phosphatase: 32 U/L — ABNORMAL LOW (ref 38–126)
Anion gap: 5 (ref 5–15)
BILIRUBIN TOTAL: 0.3 mg/dL (ref 0.3–1.2)
BUN: 14 mg/dL (ref 6–20)
CHLORIDE: 107 mmol/L (ref 101–111)
CO2: 26 mmol/L (ref 22–32)
CREATININE: 0.71 mg/dL (ref 0.44–1.00)
Calcium: 9 mg/dL (ref 8.9–10.3)
GFR calc Af Amer: >60 mL/min (ref >60)
GLUCOSE: 88 mg/dL (ref 65–99)
POTASSIUM: 3.3 mmol/L — AB (ref 3.5–5.1)
Sodium: 138 mmol/L (ref 135–145)
Total Protein: 6.8 g/dL (ref 6.5–8.1)

## 2016-08-02 LAB — CBC WITH DIFFERENTIAL/PLATELET
BASOS ABS: 0.1 10*3/uL (ref 0.0–0.1)
BASOS PCT: 3 %
Eosinophils Absolute: 0.1 10*3/uL (ref 0.0–0.7)
Eosinophils Relative: 2 %
HEMATOCRIT: 21.9 % — AB (ref 36.0–46.0)
Hemoglobin: 6.2 g/dL — CL (ref 12.0–15.0)
LYMPHS PCT: 37 %
Lymphs Abs: 1.5 10*3/uL (ref 0.7–4.0)
MCH: 22.2 pg — ABNORMAL LOW (ref 26.0–34.0)
MCHC: 28.3 g/dL — ABNORMAL LOW (ref 30.0–36.0)
MCV: 78.5 fL (ref 78.0–100.0)
Monocytes Absolute: 0.4 10*3/uL (ref 0.1–1.0)
Monocytes Relative: 10 %
NEUTROS ABS: 2 10*3/uL (ref 1.7–7.7)
NEUTROS PCT: 48 %
PLATELETS: 345 10*3/uL (ref 150–400)
RBC: 2.79 MIL/uL — AB (ref 3.87–5.11)
RDW: 16.3 % — AB (ref 11.5–15.5)
WBC: 4.2 10*3/uL (ref 4.0–10.5)

## 2016-08-02 LAB — PROTIME-INR
INR: 0.99
Prothrombin Time: 13.1 seconds (ref 11.4–15.2)

## 2016-08-02 LAB — URINALYSIS, ROUTINE W REFLEX MICROSCOPIC
Bilirubin Urine: NEGATIVE
Glucose, UA: NEGATIVE mg/dL
HGB URINE DIPSTICK: NEGATIVE
KETONES UR: NEGATIVE mg/dL
LEUKOCYTES UA: NEGATIVE
Nitrite: NEGATIVE
PROTEIN: NEGATIVE mg/dL
Specific Gravity, Urine: 1.016 (ref 1.005–1.030)
pH: 8.5 — ABNORMAL HIGH (ref 5.0–8.0)

## 2016-08-02 LAB — APTT: aPTT: 32 seconds (ref 24–36)

## 2016-08-02 LAB — LACTATE DEHYDROGENASE: LDH: 142 U/L (ref 98–192)

## 2016-08-02 LAB — PREGNANCY, URINE: PREG TEST UR: NEGATIVE

## 2016-08-02 LAB — PREPARE RBC (CROSSMATCH)

## 2016-08-02 MED ORDER — ACETAMINOPHEN 325 MG PO TABS
650.0000 mg | ORAL_TABLET | Freq: Four times a day (QID) | ORAL | Status: DC | PRN
  Administered 2016-08-02: 650 mg via ORAL
  Filled 2016-08-02: qty 2

## 2016-08-02 MED ORDER — ACETAMINOPHEN 650 MG RE SUPP: 650.0000 mg | Freq: Four times a day (QID) | RECTAL | Status: DC | PRN

## 2016-08-02 MED ORDER — POTASSIUM CHLORIDE CRYS ER 20 MEQ PO TBCR
40.0000 meq | EXTENDED_RELEASE_TABLET | Freq: Once | ORAL | Status: AC
  Administered 2016-08-02: 40 meq via ORAL
  Filled 2016-08-02: qty 2

## 2016-08-02 MED ORDER — SODIUM CHLORIDE 0.9 % IV SOLN: Freq: Once | INTRAVENOUS | Status: DC

## 2016-08-02 MED ORDER — SODIUM CHLORIDE 0.9 % IV BOLUS (SEPSIS): 1000.0000 mL | Freq: Once | INTRAVENOUS | Status: DC

## 2016-08-02 NOTE — ED Notes (Signed)
Attempted to call report

## 2016-08-02 NOTE — ED Notes (Signed)
Patient denies pain but c/o of weakness and generalized aching at times.

## 2016-08-02 NOTE — Progress Notes (Signed)
Received patient from [facility] ED via [place]. Patient AOx4, VS stable, ambulatory and c/o headache at 4/10. Patient oriented to room, bed controls and call light.  CNA offered water to drink.  Advise patient to inform staff if having a BM for stool hemoccult.  Paged admission for patient's arrival to inform Dr. Eudelia Bunch.

## 2016-08-02 NOTE — ED Triage Notes (Signed)
Body aches. States she just does not feel good. Her color is yellow. States she has been having heavy bleeding with her menses.

## 2016-08-02 NOTE — ED Provider Notes (Signed)
[place] DEPT [facility] Provider Note   CSN: [id number] Arrival date & time: 08/02/16  1859  By signing my name below, I, Valerie Richards, attest that this documentation has been prepared under the direction and in the presence of Elvy Mclarty, PA-C Electronically Signed: Soijett Richards, ED Scribe. 08/02/16. 7:55 PM.  History   Chief Complaint Chief Complaint  Patient presents with  . Vaginal Bleeding    HPI Valerie Richards is a 45 y.o. female with a PMHx of anemia, fibroid, who presents to the Emergency Department complaining of Fatigue, generalized weakness, exertional shortness of breath, and palpitations, increasing over the last 2 months. She endorses an increase in vaginal bleeding during this same time period. Her menstrual cycles have lengthened to 10 days long and she is changing her pad multiple times an hour. She states that she stopped bleeding heavily last week and is currently only spotting, however, she is still feeling weak and having episodes of shortness breath.  Pt states that she was seen by her gynecologist for some abnormal spotting and bleeding and was informed that she had an infection and was Rx medications. Sees Dr. Bobbye Charleston, [facility].  Pt reports that the women in her family typically began menopause above 67 years old. Pt also states that she has noticed that she is pale with a yellow tint to her skin. Pt states that she has a hx of anemia, but she is unaware of what her baseline hemoglobin is. She denies fever/chills, N/V/D, appetite change, abdominal pain, and any other symptoms.    The history is provided by the patient. No language interpreter was used.    Past Medical History:  Diagnosis Date  . Anemia   . Fibroid     Patient Active Problem List   Diagnosis Date Noted  . Symptomatic anemia 08/02/2016  . Anemia 05/26/2015    Past Surgical History:  Procedure Laterality Date  . TONSILLECTOMY      OB History    Gravida Para Term  Preterm AB Living   5 5 5     5    SAB TAB Ectopic Multiple Live Births         1         Home Medications    Prior to Admission medications   Medication Sig Start Date End Date Taking? Authorizing Provider  diphenhydrAMINE (BENADRYL) 50 MG capsule Take 50 mg by mouth at bedtime.    Historical Provider, MD  meloxicam (MOBIC) 15 MG tablet Take 1 tablet (15 mg total) by mouth daily. 11/23/15   Chelle Jeffery, PA-C  traZODone (DESYREL) 50 MG tablet Take 0.5-2 tablets (25-100 mg total) by mouth at bedtime as needed for sleep. 11/23/15   Harrison Mons, PA-C    Family History Family History  Problem Relation Age of Onset  . Hypertension Mother   . Cancer Father   . Diabetes Father   . Diabetes Brother     Social History Social History  Substance Use Topics  . Smoking status: Former Smoker    Packs/day: 0.25    Years: 8.00    Types: Cigarettes    Quit date: 10/10/2012  . Smokeless tobacco: Never Used  . Alcohol use No     Allergies   Review of patient's allergies indicates no known allergies.   Review of Systems Review of Systems  Constitutional: Positive for fatigue. Negative for appetite change and fever.  Respiratory: Positive for shortness of breath.   Cardiovascular: Positive for palpitations.  Gastrointestinal: Negative for abdominal pain, diarrhea and vomiting.  Genitourinary: Positive for vaginal bleeding.  Skin: Positive for pallor.       +Yellow tint to skin  Neurological: Positive for weakness and headaches.  All other systems reviewed and are negative.    Physical Exam Updated Vital Signs BP 114/68 (BP Location: Left Arm)   Pulse 92   Temp 98.1 F (36.7 C) (Oral)   Resp 18   Ht 5\' 4"  (1.626 m)   Wt 135 lb (61.2 kg)   LMP 07/25/2016   SpO2 100%   BMI 23.17 kg/m   Physical Exam  Constitutional: She is oriented to person, place, and time. She appears well-developed and well-nourished. No distress.  HENT:  Head: Normocephalic and atraumatic.   Eyes: Conjunctivae and EOM are normal.  Neck: Neck supple.  Cardiovascular: Normal rate, regular rhythm, normal heart sounds and intact distal pulses.  Exam reveals no gallop and no friction rub.   No murmur heard. Pulmonary/Chest: Effort normal and breath sounds normal. No respiratory distress. She has no wheezes. She has no rales.  Abdominal: Soft. She exhibits no distension. There is no tenderness. There is no guarding.  Musculoskeletal: Normal range of motion. She exhibits no edema or tenderness.  Lymphadenopathy:    She has no cervical adenopathy.  Neurological: She is alert and oriented to person, place, and time.  Skin: Skin is warm and dry. She is not diaphoretic. There is pallor.  Yellow tint to skin  Psychiatric: She has a normal mood and affect. Her behavior is normal.  Nursing note and vitals reviewed.    ED Treatments / Results  DIAGNOSTIC STUDIES: Oxygen Saturation is 100% on RA, nl by my interpretation.    COORDINATION OF CARE: 7:51 PM Discussed treatment plan with pt at bedside which includes labs, UA, and pt agreed to plan.   Labs (all labs ordered are listed, but only abnormal results are displayed) Labs Reviewed  CBC WITH DIFFERENTIAL/PLATELET - Abnormal; Notable for the following:       Result Value   RBC 2.79 (*)    Hemoglobin 6.2 (*)    HCT 21.9 (*)    MCH 22.2 (*)    MCHC 28.3 (*)    RDW 16.3 (*)    All other components within normal limits  COMPREHENSIVE METABOLIC PANEL - Abnormal; Notable for the following:    Potassium 3.3 (*)    ALT 8 (*)    Alkaline Phosphatase 32 (*)    All other components within normal limits  URINALYSIS, ROUTINE W REFLEX MICROSCOPIC (NOT AT [facility]) - Abnormal; Notable for the following:    APPearance CLOUDY (*)    pH 8.5 (*)    All other components within normal limits  PREGNANCY, URINE  APTT  PROTIME-INR  OCCULT BLOOD X 1 CARD TO LAB, STOOL    Procedures Procedures (including critical care time)  Medications  Ordered in ED Medications  potassium chloride SA (K-DUR,KLOR-CON) CR tablet 40 mEq (40 mEq Oral Given 08/02/16 2035)     Initial Impression / Assessment and Plan / ED Course  I have reviewed the triage vital signs and the nursing notes.  Pertinent labs that were available during my care of the patient were reviewed by me and considered in my medical decision making (see chart for details).  Clinical Course    Patient presents with symptomatic anemia. Patient to be admitted and transfusion carried out at receiving facility. Patient does not currently appear to be in any distress  and denies current chest pain or shortness of breath. No LOC changes. Vital signs are stable.  8:12 PM Spoke with Dr. Rogue Bussing, on call for [facility], to inquire about whether this patient should be transferred to and managed at [facility]. Dr. Rogue Bussing recommends transfer to [facility] for management of her symptomatically anemia and transfusion and then follow-up in the office with Dr. Philis Pique.   8:33 PM Spoke with Dr. Loleta Books, hospitalist, who agreed to admit the patient to [place] observation at [facility]. Dr. Loleta Books states that he will place all necessary orders for transfusion.  EMTALA completed and patient reevaluated prior to transfer.  Vitals:   08/02/16 1904 08/02/16 2000 08/02/16 2030 08/02/16 2037  BP: 114/68 105/65 104/55 104/55  Pulse: 92 77 86 94  Resp: 18 13 18 18   Temp: 98.1 F (36.7 C)     TempSrc: Oral     SpO2: 100% 100% 100% 100%  Weight:      Height:       Vitals:   08/02/16 2045 08/02/16 2100 08/02/16 2115 08/02/16 2124  BP:  100/64  99/55  Pulse: 80 81 88 82  Resp: 18 21 19 16   Temp:    97.5 F (36.4 C)  TempSrc:      SpO2: 100% 100% 99% 100%  Weight:      Height:          Final Clinical Impressions(s) / ED Diagnoses   Final diagnoses:  Symptomatic anemia  Abnormal vaginal bleeding    New Prescriptions New Prescriptions   No medications on file    I personally performed the services described in this documentation, which was scribed in my presence. The recorded information has been reviewed and is accurate.     Lorayne Bender, PA-C 08/02/16 2057    Lorayne Bender, PA-C 08/02/16 2136    Veryl Speak, MD 08/02/16 2248

## 2016-08-02 NOTE — H&P (Signed)
History and Physical  Patient Name: Valerie Richards     [id number]    DOB: 1971-10-04    DOA: 08/02/2016 PCP: No primary care provider on file.  Primary OB-Gyn: [facility], [facility]      Patient coming from: Home  Chief Complaint: Dyspnea  HPI: Valerie Richards is a 45 y.o. female with a past medical history significant for anemia and fibroids who presents with dyspnea for 1 week.  The patient has a previous history of heavy menses, fibroids, and anemia for which she used to take iron.  The last 9 months she has had a lot of stress from the death of her husband from [place], and over the last month she has had very heavy vaginal bleeding, and intermenstrual bleeding, for which she is planning to see her OB/GYN soon. Now however, in the last week or so, she has had noticeably increased fatigue, headaches, tachycardia, pallor, and shortness of breath with exertion so today she came to the ER to be evaluated.  ED course: -Afebrile, heart rate 90s, respirations and pulse oximetry normal, blood pressure 100-110/60 -Na 138, K 3.3, Cr 0.71, total bilirubin normal, WBC 4.2 K, Hgb 6.2 -The case was discussed with her OB/GYN on call by phone who recommended outpatient follow-up    The patient notes that she had a pelvic ultrasound she thinks about 2 years ago, and fibroids were noted, and treatment was offered, although she didn't think it was necessary at the time.  She has been anemic in the past and taken iron.  She has had no melena, no hematochezia, no nosebleeds, and no other sources of bleeding.          ROS: Review of Systems  Constitutional: Positive for malaise/fatigue.  HENT: Negative for nosebleeds.   Respiratory: Positive for shortness of breath. Negative for hemoptysis.   Gastrointestinal: Negative for blood in stool and melena.  Genitourinary: Negative for hematuria.  Neurological: Negative for tingling, sensory change, focal weakness and loss of  consciousness.  Endo/Heme/Allergies: Does not bruise/bleed easily.       Pallor   All other systems reviewed and are negative.         Past Medical History:  Diagnosis Date  . Anemia   . Fibroid     Past Surgical History:  Procedure Laterality Date  . TONSILLECTOMY      Social History: Patient lives with her children.  The patient walks unassisted.  She is a former smoker.  Her husband passed away 9 months ago.  No Known Allergies  Family history: family history includes Cancer in her father; Diabetes in her brother and father; Hypertension in her mother.  Prior to Admission medications   Medication Sig Start Date End Date Taking? Authorizing Provider  diphenhydrAMINE (BENADRYL) 50 MG capsule Take 50 mg by mouth at bedtime.    Historical Provider, MD  meloxicam (MOBIC) 15 MG tablet Take 1 tablet (15 mg total) by mouth daily. 11/23/15   Chelle Jeffery, PA-C  traZODone (DESYREL) 50 MG tablet Take 0.5-2 tablets (25-100 mg total) by mouth at bedtime as needed for sleep. 11/23/15   Harrison Mons, PA-C       Physical Exam: BP 97/68 (BP Location: Right Arm)   Pulse 81   Temp 98.5 F (36.9 C) (Oral)   Resp 16   Ht 5\' 4"  (1.626 m)   Wt 61.2 kg (135 lb)   LMP 07/25/2016   SpO2 100%   BMI 23.17 kg/m  General  appearance: Well-developed, thin adult female, alert and in no acute distress.   Eyes: Anicteric, conjunctiva pale, lids and lashes normal. PERRL.    ENT: No nasal deformity, discharge, epistaxis.  Hearing normal. OP moist without lesions.   Neck: No neck masses.  Trachea midline.  No thyromegaly/tenderness. Lymph: No cervical or supraclavicular lymphadenopathy. Skin: Pallor noted.  Warm and dry.  No jaundice.  No suspicious rashes or lesions. Cardiac: Tachycardic, regular, nl S1-S2, no murmurs appreciated. No flow murmur. Capillary refill is brisk.  JVP normal.  No LE edema.  Radial pulses 2+ and symmetric.  DP pulses diminished.  Soft left carotid bruit. Respiratory:  Normal respiratory rate and rhythm.  CTAB without rales or wheezes. Abdomen: Abdomen soft.  No TTP. No ascites, distension, hepatosplenomegaly.   MSK: No deformities or effusions.  No cyanosis or clubbing. Neuro: Cranial nerves normal.  Sensation intact to light touch. Speech is fluent.  Muscle strength normal.    Psych: Sensorium intact and responding to questions, attention normal.  Behavior appropriate.  Affect normal.  Judgment and insight appear normal.     Labs on Admission:  I have personally reviewed following labs and imaging studies: CBC:  Recent Labs Lab 08/02/16 1915  WBC 4.2  NEUTROABS 2.0  HGB 6.2*  HCT 21.9*  MCV 78.5  PLT 123456   Basic Metabolic Panel:  Recent Labs Lab 08/02/16 1915  NA 138  K 3.3*  CL 107  CO2 26  GLUCOSE 88  BUN 14  CREATININE 0.71  CALCIUM 9.0   GFR: Estimated Creatinine Clearance: 75.9 mL/min (by C-G formula based on SCr of 0.71 mg/dL).  Liver Function Tests:  Recent Labs Lab 08/02/16 1915  AST 18  ALT 8*  ALKPHOS 32*  BILITOT 0.3  PROT 6.8  ALBUMIN 3.8   No results for input(s): LIPASE, AMYLASE in the last 168 hours. No results for input(s): AMMONIA in the last 168 hours. Coagulation Profile:  Recent Labs Lab 08/02/16 1915  INR 0.99   Cardiac Enzymes: No results for input(s): CKTOTAL, CKMB, CKMBINDEX, TROPONINI in the last 168 hours. BNP (last 3 results) No results for input(s): PROBNP in the last 8760 hours. HbA1C: No results for input(s): HGBA1C in the last 72 hours. CBG: No results for input(s): GLUCAP in the last 168 hours. Lipid Profile: No results for input(s): CHOL, HDL, LDLCALC, TRIG, CHOLHDL, LDLDIRECT in the last 72 hours. Thyroid Function Tests: No results for input(s): TSH, T4TOTAL, FREET4, T3FREE, THYROIDAB in the last 72 hours. Anemia Panel:  Recent Labs  08/02/16 2233  RETICCTPCT 1.7   Sepsis Labs: Invalid input(s): PROCALCITONIN, LACTICIDVEN No results found for this or any previous  visit (from the past 240 hour(s)).           Assessment/Plan  1. Anemia:  Presumably this is iron deficiency from fibroids.    MCV low-ish. -Type and cross 1 unit now -Post-transfusion CBC check -Check FOBT -Check iron studies -Check reticulocytes -Check LDH and haptoglobin -Check B12 and folate stores -Follow up with outpatient OB-Gyn for menometrorrhagia -Start iron BID     2. Other medications:  -Continue trazodone PRN for sleep         DVT prophylaxis: SCDs  Code Status: FULL  Family Communication: None present  Disposition Plan: Anticipate transfusion of 1 unit tonight and re-evaluate in morning.  Likely discharge tomorrow if symptoms improved. Consults called: None Admission status: OBS, med surg At the point of initial evaluation, it is my clinical opinion that admission for OBSERVATION  is reasonable and necessary because the patient's presenting complaints in the context of their chronic conditions represent sufficient risk of deterioration or significant morbidity to constitute reasonable grounds for close observation in the hospital setting, but that the patient may be medically stable for discharge from the hospital within 24 to 48 hours.    Medical decision making: Patient seen at 10:50 PM on 08/02/2016.  The patient was discussed with Arlean Hopping, PA-C.  What exists of the patient's chart was reviewed in depth and summarized above.  Clinical condition: stable.        Edwin Dada [facility] Pager [phone]

## 2016-08-02 NOTE — Progress Notes (Signed)
45 yo F with no specific medical history who presents with a few days exertional dyspnea.    BP 105/65   Pulse 77   Temp 98.1 F (36.7 C) (Oral)   Resp 13   Ht 5\' 4"  (1.626 m)   Wt 61.2 kg (135 lb)   LMP 07/25/2016   SpO2 100%   BMI 23.17 kg/m   Hgb 6.2, platelets normal.  Coags normal.  BMP normal.    No active bleeding, no melena, hematochezia, other bleeding reported, but has history of heavy menses.  Valerie Richards from [facility] was called, rec'd outpatient office follow up (Patient has Valerie Richards at [facility]).    To [facility], med surg, OBS, for transfusion and then discharge tomorrow likely.

## 2016-08-03 DIAGNOSIS — D649 Anemia, unspecified: Secondary | ICD-10-CM

## 2016-08-03 LAB — HEMOGLOBIN AND HEMATOCRIT, BLOOD
HCT: 31 % — ABNORMAL LOW (ref 36.0–46.0)
Hemoglobin: 9.5 g/dL — ABNORMAL LOW (ref 12.0–15.0)

## 2016-08-03 LAB — PREPARE RBC (CROSSMATCH)

## 2016-08-03 LAB — CBC
HCT: 23.6 % — ABNORMAL LOW (ref 36.0–46.0)
HEMOGLOBIN: 7 g/dL — AB (ref 12.0–15.0)
MCH: 23.2 pg — ABNORMAL LOW (ref 26.0–34.0)
MCHC: 29.7 g/dL — AB (ref 30.0–36.0)
MCV: 78.1 fL (ref 78.0–100.0)
Platelets: 296 10*3/uL (ref 150–400)
RBC: 3.02 MIL/uL — AB (ref 3.87–5.11)
RDW: 15.9 % — ABNORMAL HIGH (ref 11.5–15.5)
WBC: 3.7 10*3/uL — ABNORMAL LOW (ref 4.0–10.5)

## 2016-08-03 LAB — IRON AND TIBC
Iron: 11 ug/dL — ABNORMAL LOW (ref 28–170)
Saturation Ratios: 2 % — ABNORMAL LOW (ref 10.4–31.8)
TIBC: 508 ug/dL — AB (ref 250–450)
UIBC: 497 ug/dL

## 2016-08-03 LAB — ABO/RH: ABO/RH(D): O POS

## 2016-08-03 LAB — VITAMIN B12: VITAMIN B 12: 434 pg/mL (ref 180–914)

## 2016-08-03 LAB — OCCULT BLOOD X 1 CARD TO LAB, STOOL: FECAL OCCULT BLD: NEGATIVE

## 2016-08-03 LAB — FERRITIN: Ferritin: 3 ng/mL — ABNORMAL LOW (ref 11–307)

## 2016-08-03 MED ORDER — WHITE PETROLATUM GEL
Status: AC
  Administered 2016-08-03: 06:00:00
  Filled 2016-08-03: qty 1

## 2016-08-03 MED ORDER — TRAZODONE HCL 50 MG PO TABS
50.0000 mg | ORAL_TABLET | Freq: Every evening | ORAL | Status: DC | PRN
  Administered 2016-08-03: 50 mg via ORAL
  Filled 2016-08-03: qty 1

## 2016-08-03 MED ORDER — FERROUS SULFATE 325 (65 FE) MG PO TABS: 325.0000 mg | ORAL_TABLET | Freq: Two times a day (BID) | ORAL | 3 refills | Status: DC

## 2016-08-03 MED ORDER — SODIUM CHLORIDE 0.9 % IV SOLN: Freq: Once | INTRAVENOUS | Status: DC

## 2016-08-03 MED ORDER — FERROUS SULFATE 325 (65 FE) MG PO TABS
325.0000 mg | ORAL_TABLET | Freq: Two times a day (BID) | ORAL | Status: DC
  Administered 2016-08-03: 325 mg via ORAL
  Filled 2016-08-03: qty 1

## 2016-08-03 NOTE — Progress Notes (Signed)
Valerie Richards to be D/C'd  per MD order. Discussed with the patient and all questions fully answered.  VSS, Skin clean, dry and intact without evidence of skin break down, no evidence of skin tears noted.  IV catheter discontinued intact. Site without signs and symptoms of complications. Dressing and pressure applied.  An After Visit Summary was printed and given to the patient. Patient received prescription.  D/c education completed with patient/family including follow up instructions, medication list, d/c activities limitations if indicated, with other d/c instructions as indicated by MD - patient able to verbalize understanding, all questions fully answered.   Patient instructed to return to ED, call 911, or call MD for any changes in condition.   Patient to be escorted via [place], and D/C home via private auto.

## 2016-08-03 NOTE — Discharge Summary (Addendum)
Physician Discharge Summary  Valerie Richards [id number] DOB: [birth year] DOA: 08/02/2016  PCP: No primary care provider on file.  Admit date: 08/02/2016 Discharge date: 08/03/2016  Admitted From: home Disposition: Home   Recommendations for Outpatient Follow-up:  1. Follow up with PCP in 1-2 weeks 2. Please obtain BMP/CBC in one week 3. Needs to follow up with GYN for further evaluation of menorrhagia.    Discharge Condition: Stable.  CODE STATUS: Full code.  Diet recommendation: Heart Healthy   Brief/Interim Summary: Valerie Richards is a 45 y.o. female with a past medical history significant for anemia and fibroids who presents with dyspnea for 1 week. The patient has a previous history of heavy menses, fibroids, and anemia for which she used to take iron. The last 9 months she has had a lot of stress from the death of her husband from [place], and over the last month she has had very heavy vaginal bleeding, and intermenstrual bleeding, for which she is planning to see her OB/GYN soon. Now however, in the last week or so, she has had noticeably increased fatigue, headaches, tachycardia, pallor, and shortness of breath with exertion so today she came to the ER to be evaluated.  ED course: -Afebrile, heart rate 90s, respirations and pulse oximetry normal, blood pressure 100-110/60 -Na 138, K 3.3, Cr 0.71, total bilirubin normal, WBC 4.2 K, Hgb 6.2 -The case was discussed with her OB/GYN on call by phone who recommended outpatient follow-up  The patient notes that she had a pelvic ultrasound she thinks about 2 years ago, and fibroids were noted, and treatment was offered, although she didn't think it was necessary at the time. She has been anemic in the past and taken iron. She has had no melena, no hematochezia, no nosebleeds, and no other sources of bleeding.  1-Severe anemia, iron deficiency in setting vaginal bleeding;  She will received a total of 3 units of PRBC.  If hb stable  after transfusion, plan for discharge later today.  Needs follow up with GYN.  Need repeat Hb outpatient.  Will provide prescription for iron supplement.    Discharge Diagnoses:  Active Problems:   Symptomatic anemia    Discharge Instructions  Discharge Instructions    Diet - low sodium heart healthy    Complete by:  As directed    Increase activity slowly    Complete by:  As directed        Medication List    STOP taking these medications   meloxicam 15 MG tablet Commonly known as:  MOBIC   metroNIDAZOLE 500 MG tablet Commonly known as:  FLAGYL     TAKE these medications   amitriptyline 25 MG tablet Commonly known as:  ELAVIL Take 25 mg by mouth daily.   ferrous sulfate 325 (65 FE) MG tablet Take 1 tablet (325 mg total) by mouth 2 (two) times daily with a meal.      Follow-up Information    HORVATH,MICHELLE A, MD.   Specialty:  Obstetrics and Gynecology Why:  As soon as possible for follow up, For chronic management of this issue Contact information: [address] [place] [address] [phone]        [facility]. Schedule an appointment as soon as possible for a visit today.   Contact information: [address] [place] [phone]         No Known Allergies  Consultations:  none   Procedures/Studies:  No results found. none  Subjective: Feeling better.  No significant vaginal bleeding. Almost stop.    Discharge Exam: Vitals:   08/03/16 1126 08/03/16 1356  BP: (!) 103/55 102/62  Pulse: 69 70  Resp: 20 20  Temp: 98.8 F (37.1 C) 98.3 F (36.8 C)   Vitals:   08/03/16 1041 08/03/16 1042 08/03/16 1126 08/03/16 1356  BP: 107/64 107/64 (!) 103/55 102/62  Pulse: 70 70 69 70  Resp: 18 18 20 20   Temp: 97.8 F (36.6 C) 97.8 F (36.6 C) 98.8 F (37.1 C) 98.3 F (36.8 C)  TempSrc: Oral Oral Oral Oral  SpO2: 100% 100% 100% 100%  Weight:       Height:        General: Pt is alert, awake, not in acute distress Cardiovascular: RRR, S1/S2 +, no rubs, no gallops Respiratory: CTA bilaterally, no wheezing, no rhonchi Abdominal: Soft, NT, ND, bowel sounds + Extremities: no edema, no cyanosis    The results of significant diagnostics from this hospitalization (including imaging, microbiology, ancillary and laboratory) are listed below for reference.     Microbiology: No results found for this or any previous visit (from the past 240 hour(s)).   Labs: BNP (last 3 results) No results for input(s): BNP in the last 8760 hours. Basic Metabolic Panel:  Recent Labs Lab 08/02/16 1915  NA 138  K 3.3*  CL 107  CO2 26  GLUCOSE 88  BUN 14  CREATININE 0.71  CALCIUM 9.0   Liver Function Tests:  Recent Labs Lab 08/02/16 1915  AST 18  ALT 8*  ALKPHOS 32*  BILITOT 0.3  PROT 6.8  ALBUMIN 3.8   No results for input(s): LIPASE, AMYLASE in the last 168 hours. No results for input(s): AMMONIA in the last 168 hours. CBC:  Recent Labs Lab 08/02/16 1915 08/03/16 0514  WBC 4.2 3.7*  NEUTROABS 2.0  --   HGB 6.2* 7.0*  HCT 21.9* 23.6*  MCV 78.5 78.1  PLT 345 296   Cardiac Enzymes: No results for input(s): CKTOTAL, CKMB, CKMBINDEX, TROPONINI in the last 168 hours. BNP: Invalid input(s): POCBNP CBG: No results for input(s): GLUCAP in the last 168 hours. D-Dimer No results for input(s): DDIMER in the last 72 hours. Hgb A1c No results for input(s): HGBA1C in the last 72 hours. Lipid Profile No results for input(s): CHOL, HDL, LDLCALC, TRIG, CHOLHDL, LDLDIRECT in the last 72 hours. Thyroid function studies No results for input(s): TSH, T4TOTAL, T3FREE, THYROIDAB in the last 72 hours.  Invalid input(s): FREET3 Anemia work up  Recent Labs  08/02/16 2233  VITAMINB12 434  FERRITIN 3*  TIBC 508*  IRON 11*  RETICCTPCT 1.7   Urinalysis    Component Value Date/Time   COLORURINE YELLOW 08/02/2016 2000    APPEARANCEUR CLOUDY (A) 08/02/2016 2000   LABSPEC 1.016 08/02/2016 2000   PHURINE 8.5 (H) 08/02/2016 2000   GLUCOSEU NEGATIVE 08/02/2016 2000   HGBUR NEGATIVE 08/02/2016 2000   BILIRUBINUR NEGATIVE 08/02/2016 2000   KETONESUR NEGATIVE 08/02/2016 2000   PROTEINUR NEGATIVE 08/02/2016 2000   UROBILINOGEN 0.2 03/24/2014 1400   NITRITE NEGATIVE 08/02/2016 2000   LEUKOCYTESUR NEGATIVE 08/02/2016 2000   Sepsis Labs Invalid input(s): PROCALCITONIN,  WBC,  LACTICIDVEN Microbiology No results found for this or any previous visit (from the past 240 hour(s)).   Time coordinating discharge: Over 30 minutes  SIGNED:   Elmarie Shiley, MD  [facility] 08/03/2016, 3:07 PM Pager   If 7PM-7AM, please contact night-coverage [email or web address] Password  [email or web address]

## 2016-08-03 NOTE — Progress Notes (Addendum)
On call IM MD Dr. Loleta Books made aware of patient's low BPs at 86/50 after the completion of 1 Unit PRBC. Patient lying on bed with both eyes closed and no complain of dizziness.  Awaiting MD's response.  Will monitor patient.

## 2016-08-03 NOTE — Care Management (Signed)
Patient needs PCP. Explained to patient she can call number on her insurance card and be provided a complete list of MD's in network with her insurance.   Also provided [facility] number . Explained to patient if she wants a [facility] she can call this number and be provided a list of [facility] MD in network with her insurance.   Also gave patient [facility] information.   Patient voiced understanding to all of the above.   Magdalen Spatz RN BSN [phone]

## 2016-08-04 LAB — TYPE AND SCREEN
ABO/RH(D): O POS
ANTIBODY SCREEN: NEGATIVE
UNIT DIVISION: 0
Unit division: 0
Unit division: 0

## 2016-08-04 LAB — FOLATE RBC
FOLATE, HEMOLYSATE: 532.7 ng/mL
Folate, RBC: 2400 ng/mL (ref >498)
HEMATOCRIT: 22.2 % — AB (ref 34.0–46.6)

## 2016-08-04 LAB — HAPTOGLOBIN: Haptoglobin: 137 mg/dL (ref 34–200)

## 2016-08-24 ENCOUNTER — Other Ambulatory Visit: Payer: Self-pay | Admitting: Obstetrics and Gynecology

## 2016-08-24 DIAGNOSIS — Z1231 Encounter for screening mammogram for malignant neoplasm of breast: Secondary | ICD-10-CM

## 2016-09-16 ENCOUNTER — Ambulatory Visit
Admission: RE | Admit: 2016-09-16 | Discharge: 2016-09-16 | Disposition: A | Discharge status: Home / Self Care | Payer: BC Managed Care – PPO | Source: Ambulatory Visit | Attending: Obstetrics and Gynecology | Admitting: Obstetrics and Gynecology

## 2016-09-16 DIAGNOSIS — Z1231 Encounter for screening mammogram for malignant neoplasm of breast: Secondary | ICD-10-CM

## 2016-09-19 ENCOUNTER — Other Ambulatory Visit: Payer: Self-pay | Admitting: Obstetrics and Gynecology

## 2016-09-19 DIAGNOSIS — N644 Mastodynia: Secondary | ICD-10-CM

## 2016-10-01 ENCOUNTER — Encounter (HOSPITAL_COMMUNITY): Payer: Self-pay | Admitting: Emergency Medicine

## 2016-10-01 ENCOUNTER — Emergency Department (HOSPITAL_COMMUNITY)
Admission: EM | Admit: 2016-10-01 | Discharge: 2016-10-01 | Disposition: A | Discharge status: Home / Self Care | Payer: BC Managed Care – PPO | Attending: Emergency Medicine | Admitting: Emergency Medicine

## 2016-10-01 DIAGNOSIS — Z87891 Personal history of nicotine dependence: Secondary | ICD-10-CM | POA: Insufficient documentation

## 2016-10-01 DIAGNOSIS — R1310 Dysphagia, unspecified: Secondary | ICD-10-CM | POA: Insufficient documentation

## 2016-10-01 DIAGNOSIS — R0602 Shortness of breath: Secondary | ICD-10-CM | POA: Diagnosis present

## 2016-10-01 LAB — CBC WITH DIFFERENTIAL/PLATELET
Basophils Absolute: 0.1 10*3/uL (ref 0.0–0.1)
Basophils Relative: 1 %
EOS PCT: 1 %
Eosinophils Absolute: 0.1 10*3/uL (ref 0.0–0.7)
HCT: 30.5 % — ABNORMAL LOW (ref 36.0–46.0)
Hemoglobin: 9.9 g/dL — ABNORMAL LOW (ref 12.0–15.0)
LYMPHS ABS: 0.8 10*3/uL (ref 0.7–4.0)
Lymphocytes Relative: 15 %
MCH: 28.1 pg (ref 26.0–34.0)
MCHC: 32.5 g/dL (ref 30.0–36.0)
MCV: 86.6 fL (ref 78.0–100.0)
MONO ABS: 0.3 10*3/uL (ref 0.1–1.0)
Monocytes Relative: 5 %
Neutro Abs: 4.5 10*3/uL (ref 1.7–7.7)
Neutrophils Relative %: 78 %
PLATELETS: 251 10*3/uL (ref 150–400)
RBC: 3.52 MIL/uL — AB (ref 3.87–5.11)
RDW: 17.7 % — AB (ref 11.5–15.5)
WBC: 5.8 10*3/uL (ref 4.0–10.5)

## 2016-10-01 LAB — BASIC METABOLIC PANEL
Anion gap: 5 (ref 5–15)
BUN: 22 mg/dL — AB (ref 6–20)
CHLORIDE: 106 mmol/L (ref 101–111)
CO2: 27 mmol/L (ref 22–32)
Calcium: 9.2 mg/dL (ref 8.9–10.3)
Creatinine, Ser: 0.78 mg/dL (ref 0.44–1.00)
GFR calc Af Amer: >60 mL/min (ref >60)
GFR calc non Af Amer: >60 mL/min (ref >60)
GLUCOSE: 107 mg/dL — AB (ref 65–99)
POTASSIUM: 4.2 mmol/L (ref 3.5–5.1)
Sodium: 138 mmol/L (ref 135–145)

## 2016-10-01 LAB — TSH: TSH: 1.983 u[IU]/mL (ref 0.350–4.500)

## 2016-10-01 LAB — RAPID STREP SCREEN (MED CTR MEBANE ONLY): Streptococcus, Group A Screen (Direct): NEGATIVE

## 2016-10-01 MED ORDER — ALUM & MAG HYDROXIDE-SIMETH 200-200-20 MG/5ML PO SUSP
30.0000 mL | Freq: Once | ORAL | Status: AC
  Administered 2016-10-01: 30 mL via ORAL
  Filled 2016-10-01: qty 30

## 2016-10-01 MED ORDER — RANITIDINE HCL 150 MG PO CAPS: 150.0000 mg | ORAL_CAPSULE | Freq: Every day | ORAL | 0 refills | Status: DC

## 2016-10-01 NOTE — ED Triage Notes (Signed)
Patient complaining of having an allergic reaction. Patient states she ate lentils. Patient states her throat is dry, sob, and sore throat.

## 2016-10-01 NOTE — ED Provider Notes (Signed)
[facility] DEPT Provider Note   CSN: [id number] Arrival date & time: 10/01/16  0205     History   Chief Complaint Chief Complaint  Patient presents with  . Allergic Reaction    HPI Valerie Richards is a 45 y.o. female.  The history is provided by the patient. No language interpreter was used.  Allergic Reaction   Valerie Richards is a 45 y.o. female who presents to the Emergency Department complaining of sob.  She reports 1 year of a sensation of intermittent difficulty swallowing and dry mouth and throat. Last night she ate lentils, which she typically eats and she developed shortness of breath and a dry feeling in her throat and feeling like her throat was closing and difficulty breathing. She had associated blurred vision. Symptoms have improved but she does have ongoing discomfort with swallowing and a dry feeling in her throat .   No fevers, chest pain, leg swelling or pain. Past Medical History:  Diagnosis Date  . Anemia   . Fibroid     Patient Active Problem List   Diagnosis Date Noted  . Symptomatic anemia 08/02/2016    Past Surgical History:  Procedure Laterality Date  . TONSILLECTOMY      OB History    Gravida Para Term Preterm AB Living   5 5 5     5    SAB TAB Ectopic Multiple Live Births         1         Home Medications    Prior to Admission medications   Medication Sig Start Date End Date Taking? Authorizing Provider  ibuprofen (ADVIL,MOTRIN) 200 MG tablet Take 400 mg by mouth every 6 (six) hours as needed for moderate pain.   Yes Historical Provider, MD  ferrous sulfate 325 (65 FE) MG tablet Take 1 tablet (325 mg total) by mouth 2 (two) times daily with a meal. Patient not taking: Reported on 10/01/2016 08/03/16   Belkys A Regalado, MD  ranitidine (ZANTAC) 150 MG capsule Take 1 capsule (150 mg total) by mouth daily. 10/01/16   Quintella Reichert, MD    Family History Family History  Problem Relation Age of Onset  . Hypertension Mother   .  Cancer Father   . Diabetes Father   . Diabetes Brother     Social History Social History  Substance Use Topics  . Smoking status: Former Smoker    Packs/day: 0.25    Years: 8.00    Types: Cigarettes    Quit date: 10/10/2012  . Smokeless tobacco: Never Used  . Alcohol use No     Allergies   Patient has no known allergies.   Review of Systems Review of Systems  All other systems reviewed and are negative.    Physical Exam Updated Vital Signs BP 100/62   Pulse 66   Temp 97.1 F (36.2 C) (Oral)   Resp 18   Ht 5\' 5"  (1.651 m)   Wt 140 lb (63.5 kg)   LMP 09/26/2016 (Approximate)   SpO2 100%   BMI 23.30 kg/m   Physical Exam  Constitutional: She is oriented to person, place, and time. She appears well-developed and well-nourished.  HENT:  Head: Normocephalic and atraumatic.  Mouth/Throat: Oropharynx is clear and moist.  Neck: Neck supple. No thyromegaly present.  Cardiovascular: Normal rate and regular rhythm.   No murmur heard. Pulmonary/Chest: Effort normal and breath sounds normal. No stridor. No respiratory distress.  Abdominal: Soft. There is no tenderness. There is  no rebound and no guarding.  Musculoskeletal: She exhibits no edema or tenderness.  Lymphadenopathy:    She has no cervical adenopathy.  Neurological: She is alert and oriented to person, place, and time.  Skin: Skin is warm and dry.  Psychiatric: She has a normal mood and affect. Her behavior is normal.  Nursing note and vitals reviewed.    ED Treatments / Results  Labs (all labs ordered are listed, but only abnormal results are displayed) Labs Reviewed  BASIC METABOLIC PANEL - Abnormal; Notable for the following:       Result Value   Glucose, Bld 107 (*)    BUN 22 (*)    All other components within normal limits  CBC WITH DIFFERENTIAL/PLATELET - Abnormal; Notable for the following:    RBC 3.52 (*)    Hemoglobin 9.9 (*)    HCT 30.5 (*)    RDW 17.7 (*)    All other components within  normal limits  RAPID STREP SCREEN (NOT AT [facility])  CULTURE, GROUP A STREP [facility])  TSH    EKG  EKG Interpretation  Date/Time:  Saturday October 01 2016 02:11:06 EST Ventricular Rate:  90 PR Interval:    QRS Duration: 91 QT Interval:  449 QTC Calculation: 550 R Axis:   75 Text Interpretation:  Sinus rhythm Consider left atrial enlargement Low voltage, precordial leads Prolonged QT interval Artifact Confirmed by Hazle Coca [phone]) on 10/01/2016 6:22:32 AM       Radiology No results found.  Procedures Procedures (including critical care time)  Medications Ordered in ED Medications  alum & mag hydroxide-simeth (MAALOX/MYLANTA) 200-200-20 MG/5ML suspension 30 mL (30 mLs Oral Given 10/01/16 0609)     Initial Impression / Assessment and Plan / ED Course  I have reviewed the triage vital signs and the nursing notes.  Pertinent labs & imaging results that were available during my care of the patient were reviewed by me and considered in my medical decision making (see chart for details).  Clinical Course     Patient here for evaluation of one year of intermittent throat discomfort and an episode of feeling like her throat was closing prior to ED arrival. She is in no respiratory distress in the emergency department with no stridor and is able to swallow without difficulty. Following Maalox her throat feels improved. Current clinical picture is not consistent with RPA, epiglottitis, angioedema. We will treat for possible reflux with outpatient GI follow-up for repeat assessment and possible scoping. Discussed home care, outpatient follow-up and return precautions. TSH was ordered in the emergency department, discussed with patient that we will not get the results back today and she will need to follow-up with her doctor for results.  Final Clinical Impressions(s) / ED Diagnoses   Final diagnoses:  Dysphagia, unspecified type    New Prescriptions New Prescriptions   RANITIDINE  (ZANTAC) 150 MG CAPSULE    Take 1 capsule (150 mg total) by mouth daily.     Quintella Reichert, MD 10/01/16 [phone]

## 2016-10-01 NOTE — ED Notes (Signed)
Pt reports that she had eaten some lentils earlier in the day and then when to bed ash be began to have blurred vision, dizziness nausea, and a tightness  And dryness in her throat that also had a burning feeling. Pt states that she has had this feeling before but today the discomfort was more than before.Pt denies vomiting and diarrhea.

## 2016-10-01 NOTE — ED Notes (Signed)
Pt now c/o dizziness

## 2016-10-03 LAB — CULTURE, GROUP A STREP (THRC)

## 2016-10-04 ENCOUNTER — Ambulatory Visit
Admission: RE | Admit: 2016-10-04 | Discharge: 2016-10-04 | Disposition: A | Discharge status: Home / Self Care | Payer: BC Managed Care – PPO | Source: Ambulatory Visit | Attending: Obstetrics and Gynecology | Admitting: Obstetrics and Gynecology

## 2016-10-04 DIAGNOSIS — N644 Mastodynia: Secondary | ICD-10-CM

## 2016-10-04 IMAGING — MG 2D DIGITAL DIAGNOSTIC BILATERAL MAMMOGRAM WITH CAD AND ADJUNCT T
9 of 14 series · 9 of 34 positions shown · non-contrast
Comparison: Previous exam(s).

CLINICAL DATA: Focal pain in the left breast.

EXAM:
2D DIGITAL DIAGNOSTIC BILATERAL MAMMOGRAM WITH CAD AND ADJUNCT TOMO
ULTRASOUND LEFT BREAST

[R CC synth-2D]
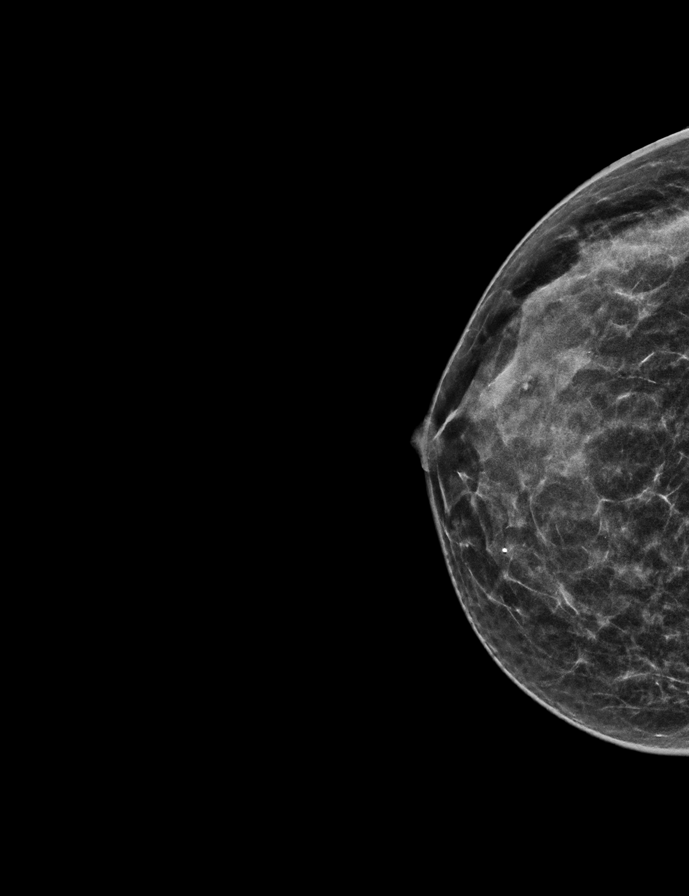

[L MLO synth-2D]
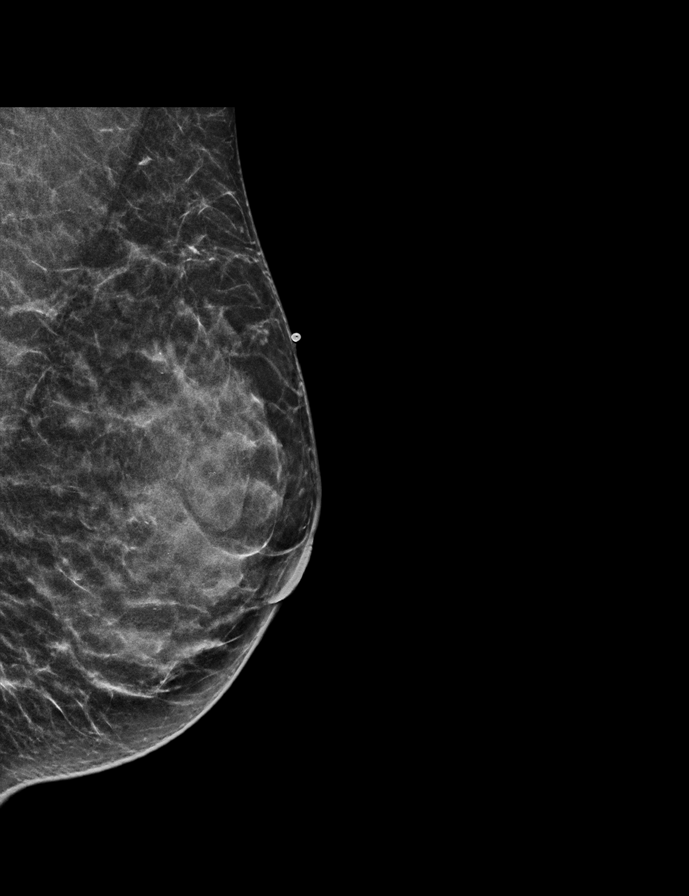

[L MLO (1 of 2)]
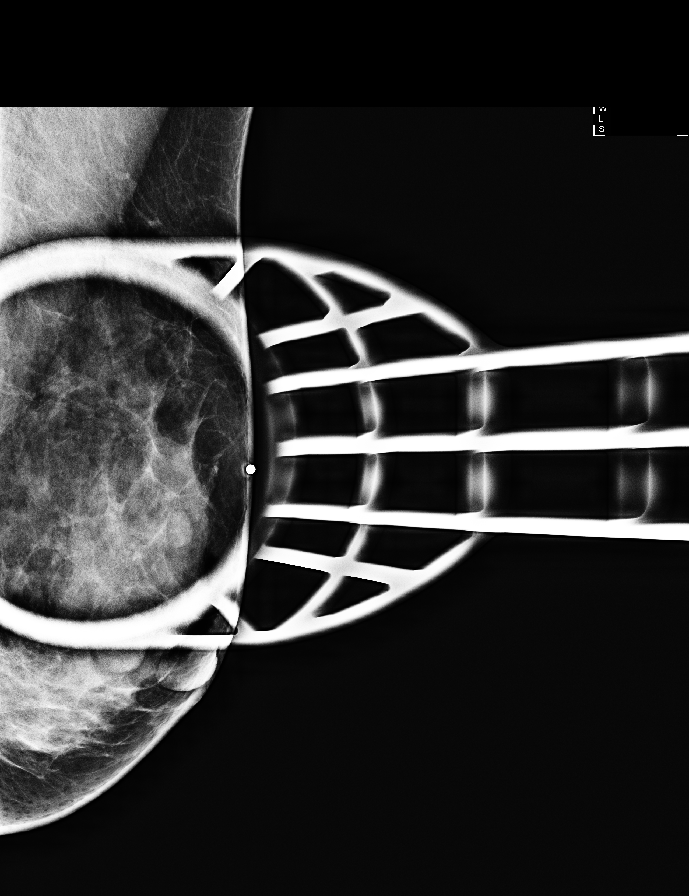

[L CC synth-2D]
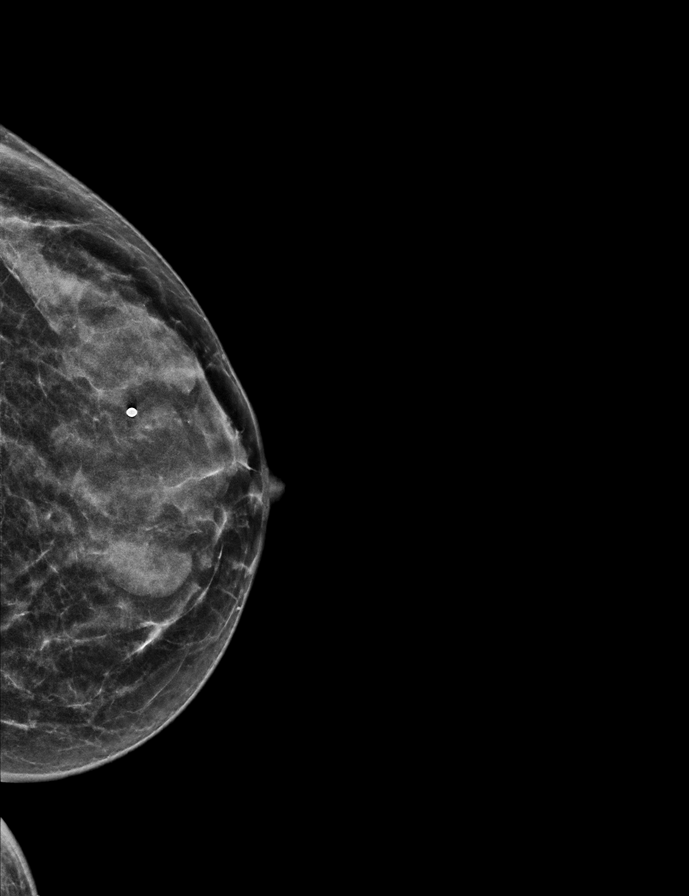

[R MLO synth-2D]
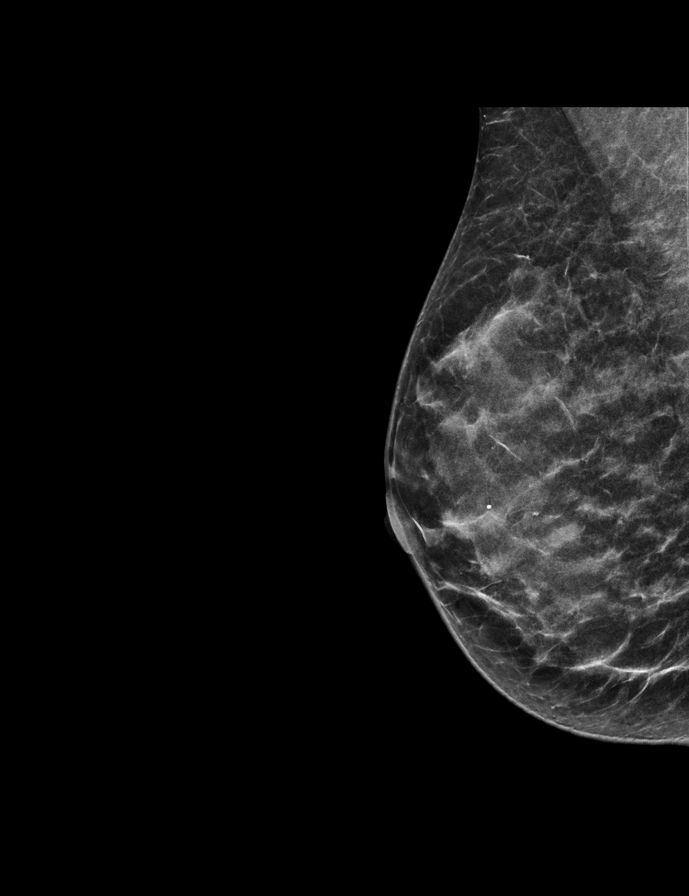

[L MLO (2 of 2)]
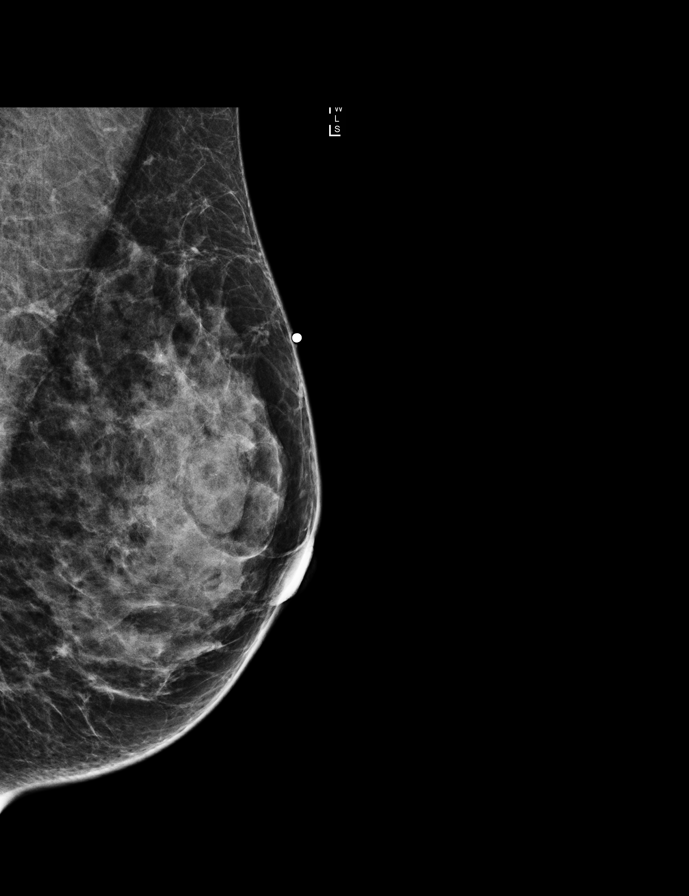

[R MLO]
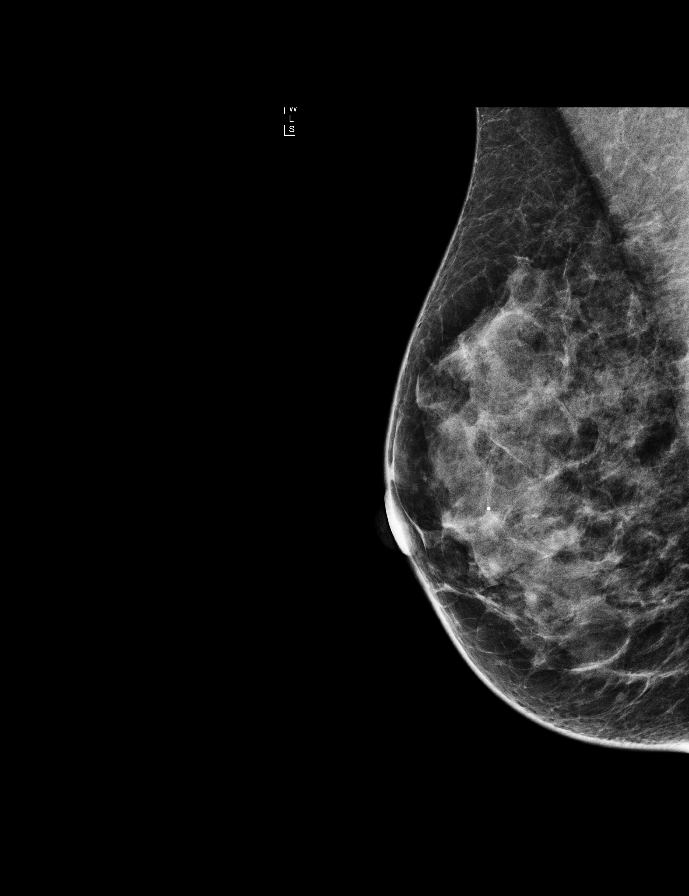

[L CC]
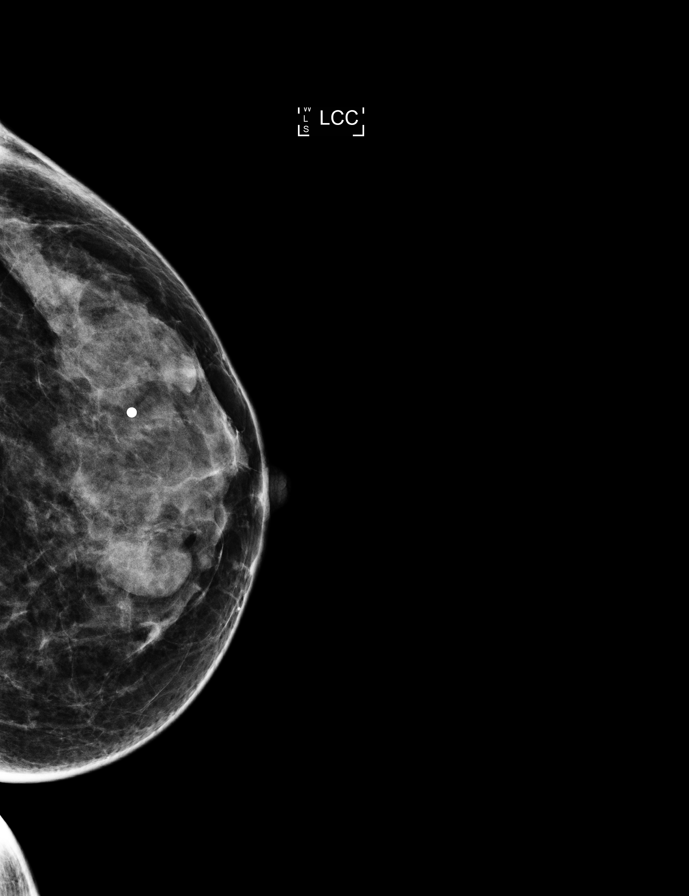

[R CC]
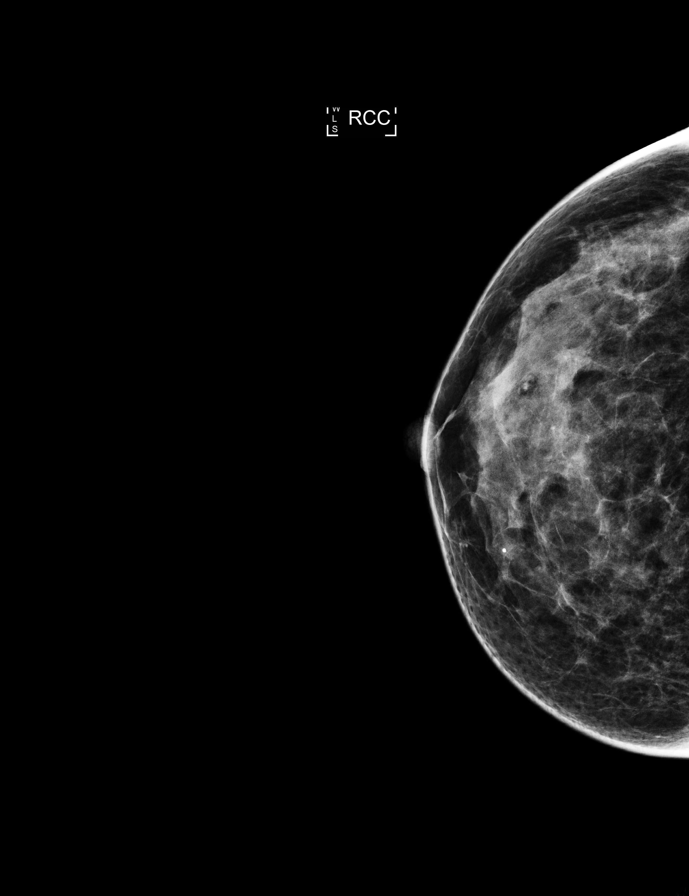

[9 of 34 positions shown; findings below may reference images not displayed]

ACR Breast Density Category c: The breast tissue is heterogeneously
dense, which may obscure small masses.
FINDINGS: The patient's known left breast fibroadenomas are stable
mammographically. No other suspicious findings or changes.

Mammographic images were processed with CAD.

On physical exam, no suspicious lumps are identified.

Targeted ultrasound is performed, showing no sonographic abnormality
in the region of the patient's focal pain. The patient states the
pain waxes and wanes and is not present today.
IMPRESSION: No mammographic evidence of malignancy.

RECOMMENDATION:
Treatment of the patient's focal pain should be based on clinical
and physical exam given the lack of imaging findings. Recommend
annual screening mammography.

I have discussed the findings and recommendations with the patient.
Results were also provided in writing at the conclusion of the
visit. If applicable, a reminder letter will be sent to the patient
regarding the next appointment.

BI-RADS CATEGORY  2: Benign.

## 2016-11-10 ENCOUNTER — Emergency Department (HOSPITAL_BASED_OUTPATIENT_CLINIC_OR_DEPARTMENT_OTHER): Payer: BC Managed Care – PPO

## 2016-11-10 ENCOUNTER — Encounter (HOSPITAL_BASED_OUTPATIENT_CLINIC_OR_DEPARTMENT_OTHER): Payer: Self-pay

## 2016-11-10 ENCOUNTER — Emergency Department (HOSPITAL_BASED_OUTPATIENT_CLINIC_OR_DEPARTMENT_OTHER)
Admission: EM | Admit: 2016-11-10 | Discharge: 2016-11-10 | Disposition: A | Discharge status: Home / Self Care | Payer: BC Managed Care – PPO | Attending: Emergency Medicine | Admitting: Emergency Medicine

## 2016-11-10 DIAGNOSIS — D649 Anemia, unspecified: Secondary | ICD-10-CM | POA: Diagnosis not present

## 2016-11-10 DIAGNOSIS — R5383 Other fatigue: Secondary | ICD-10-CM | POA: Diagnosis present

## 2016-11-10 DIAGNOSIS — Z87891 Personal history of nicotine dependence: Secondary | ICD-10-CM | POA: Insufficient documentation

## 2016-11-10 LAB — URINALYSIS, ROUTINE W REFLEX MICROSCOPIC
Bilirubin Urine: NEGATIVE
GLUCOSE, UA: NEGATIVE mg/dL
KETONES UR: NEGATIVE mg/dL
Leukocytes, UA: NEGATIVE
Nitrite: NEGATIVE
PROTEIN: NEGATIVE mg/dL
Specific Gravity, Urine: 1.006 (ref 1.005–1.030)
pH: 7.5 (ref 5.0–8.0)

## 2016-11-10 LAB — URINALYSIS, MICROSCOPIC (REFLEX): WBC, UA: NONE SEEN WBC/hpf (ref 0–5)

## 2016-11-10 LAB — BASIC METABOLIC PANEL
Anion gap: 7 (ref 5–15)
BUN: 11 mg/dL (ref 6–20)
CHLORIDE: 103 mmol/L (ref 101–111)
CO2: 28 mmol/L (ref 22–32)
Calcium: 8.8 mg/dL — ABNORMAL LOW (ref 8.9–10.3)
Creatinine, Ser: 0.66 mg/dL (ref 0.44–1.00)
GFR calc non Af Amer: >60 mL/min (ref >60)
GLUCOSE: 91 mg/dL (ref 65–99)
Potassium: 3.5 mmol/L (ref 3.5–5.1)
Sodium: 138 mmol/L (ref 135–145)

## 2016-11-10 LAB — CBC WITH DIFFERENTIAL/PLATELET
Basophils Absolute: 0 10*3/uL (ref 0.0–0.1)
Basophils Relative: 1 %
Eosinophils Absolute: 0.1 10*3/uL (ref 0.0–0.7)
Eosinophils Relative: 2 %
HEMATOCRIT: 36.3 % (ref 36.0–46.0)
HEMOGLOBIN: 11.2 g/dL — AB (ref 12.0–15.0)
LYMPHS ABS: 1 10*3/uL (ref 0.7–4.0)
LYMPHS PCT: 42 %
MCH: 27.8 pg (ref 26.0–34.0)
MCHC: 30.9 g/dL (ref 30.0–36.0)
MCV: 90.1 fL (ref 78.0–100.0)
MONOS PCT: 9 %
Monocytes Absolute: 0.2 10*3/uL (ref 0.1–1.0)
NEUTROS ABS: 1.1 10*3/uL — AB (ref 1.7–7.7)
NEUTROS PCT: 46 %
Platelets: 183 10*3/uL (ref 150–400)
RBC: 4.03 MIL/uL (ref 3.87–5.11)
RDW: 14.1 % (ref 11.5–15.5)
WBC: 2.4 10*3/uL — ABNORMAL LOW (ref 4.0–10.5)

## 2016-11-10 LAB — TROPONIN I: Troponin I: <0.03 ng/mL (ref <0.03)

## 2016-11-10 IMAGING — CR DG CHEST 2V
2 series · 2 of 2 positions shown · non-contrast
Comparison: Chest radiograph [DATE].

CLINICAL DATA: Patient with low lying.  Shortness of breath.

EXAM:
CHEST  2 VIEW

[w chest pa]
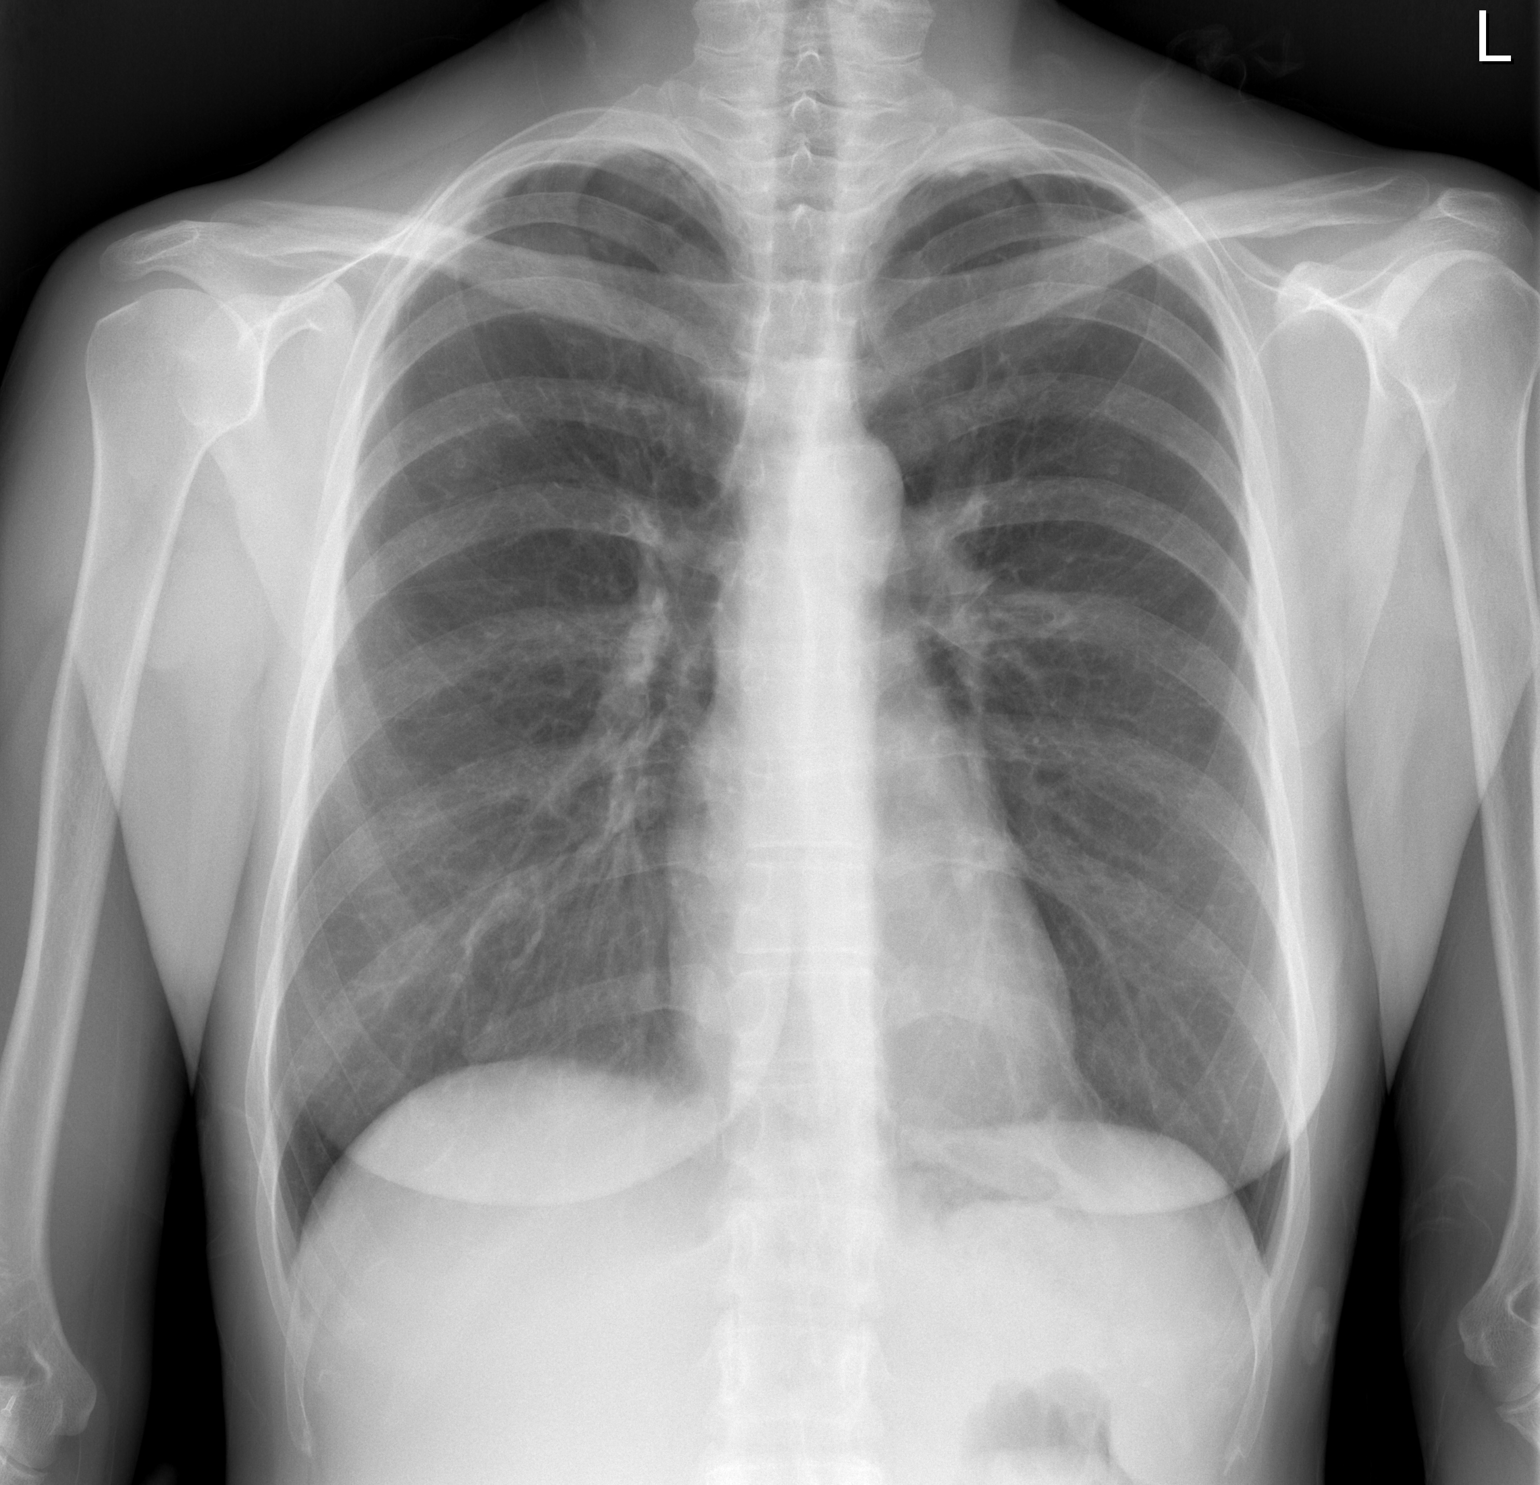

[w chest lat]
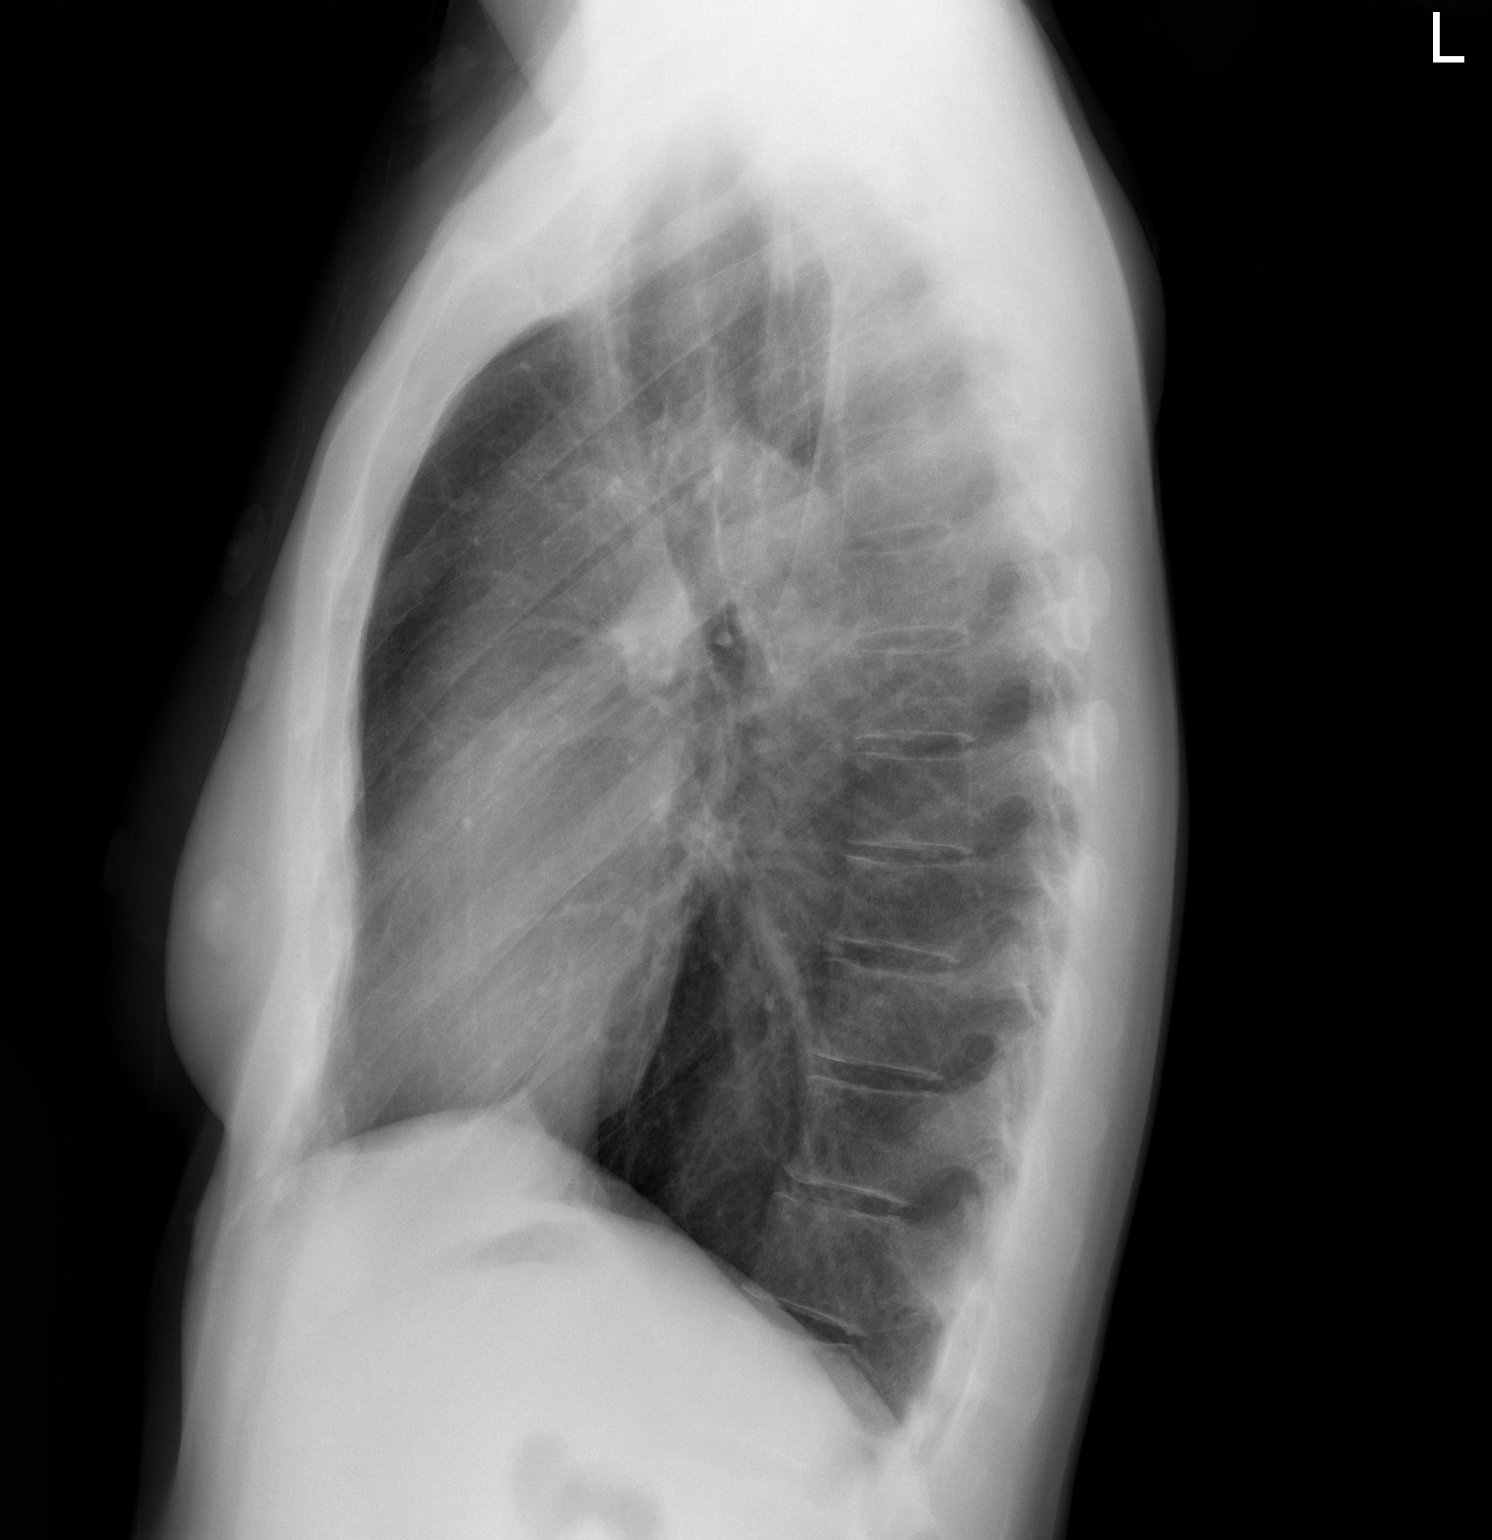

[2 of 2 positions shown; findings below may reference images not displayed]

FINDINGS: Normal cardiac and mediastinal contours. Biapical pleuroparenchymal
thickening. No consolidative pulmonary opacities. No pleural
effusion or pneumothorax. Regional skeleton is unremarkable.
IMPRESSION: No acute cardiopulmonary process.

## 2016-11-10 NOTE — ED Provider Notes (Signed)
[facility] Provider Note   CSN: [id number] Arrival date & time: 11/10/16  1221     History   Chief Complaint Chief Complaint  Patient presents with  . Fatigue    HPI Valerie Richards is a 46 y.o. female.  The history is provided by the patient and medical records.    46 year old female with history of anemia requiring transfusion in the past, presenting to the ED for fatigue. Patient reports she has felt this way for nearly a week now. She was seen at a local clinic and told that her iron was low, they told her it was around 5, hemoglobin was around 10. States she did start the iron supplements but they seem to upset her stomach.  She has been taking with food as directed. States she has had some nasal congestion and a mild cough which is been nonproductive. She reports some shortness of breath, more so with exertional activity. No chest pain. States mostly he is feels fatigued with low energy. She has not had any falls or syncope. States she had heavy menstrual cycles previously secondary to uterine fibroids, however they have been normal recently. She denies any blood in the stool.  Past Medical History:  Diagnosis Date  . Anemia   . Fibroid     Patient Active Problem List   Diagnosis Date Noted  . Symptomatic anemia 08/02/2016    Past Surgical History:  Procedure Laterality Date  . TONSILLECTOMY      OB History    Gravida Para Term Preterm AB Living   5 5 5     5    SAB TAB Ectopic Multiple Live Births         1         Home Medications    Prior to Admission medications   Not on File    Family History Family History  Problem Relation Age of Onset  . Hypertension Mother   . Cancer Father   . Diabetes Father   . Diabetes Brother     Social History Social History  Substance Use Topics  . Smoking status: Former Smoker    Packs/day: 0.25    Years: 8.00    Types: Cigarettes    Quit date: 10/10/2012  . Smokeless tobacco: Never Used  . Alcohol  use No     Allergies   Patient has no known allergies.   Review of Systems Review of Systems  Constitutional: Positive for fatigue.  All other systems reviewed and are negative.    Physical Exam Updated Vital Signs BP 112/80 (BP Location: Left Arm)   Pulse 71   Temp 97.6 F (36.4 C) (Oral)   Resp 18   Ht 5\' 8"  (1.727 m)   Wt 62.2 kg   LMP 10/13/2016   SpO2 100%   BMI 20.85 kg/m   Physical Exam  Constitutional: She is oriented to person, place, and time. She appears well-developed and well-nourished.  HENT:  Head: Normocephalic and atraumatic.  Mouth/Throat: Oropharynx is clear and moist.  Eyes: Conjunctivae and EOM are normal. Pupils are equal, round, and reactive to light.  Conjunctiva do not appear overly pale  Neck: Normal range of motion.  Cardiovascular: Normal rate, regular rhythm and normal heart sounds.   Pulmonary/Chest: Effort normal and breath sounds normal. No respiratory distress. She has no wheezes.  Abdominal: Soft. Bowel sounds are normal. There is no tenderness. There is no rebound.  Musculoskeletal: Normal range of motion.  Neurological: She is  alert and oriented to person, place, and time.  AAOx3, answering questions and following commands appropriately; equal strength UE and LE bilaterally; CN grossly intact; moves all extremities appropriately without ataxia; no focal neuro deficits or facial asymmetry appreciated  Skin: Skin is warm and dry.  Psychiatric: She has a normal mood and affect.  Nursing note and vitals reviewed.    ED Treatments / Results  Labs (all labs ordered are listed, but only abnormal results are displayed) Labs Reviewed  CBC WITH DIFFERENTIAL/PLATELET - Abnormal; Notable for the following:       Result Value   WBC 2.4 (*)    Hemoglobin 11.2 (*)    Neutro Abs 1.1 (*)    All other components within normal limits  BASIC METABOLIC PANEL - Abnormal; Notable for the following:    Calcium 8.8 (*)    All other components  within normal limits  URINALYSIS, ROUTINE W REFLEX MICROSCOPIC - Abnormal; Notable for the following:    Hgb urine dipstick SMALL (*)    All other components within normal limits  URINALYSIS, MICROSCOPIC (REFLEX) - Abnormal; Notable for the following:    Bacteria, UA FEW (*)    Squamous Epithelial / LPF 0-5 (*)    All other components within normal limits  TROPONIN I    EKG  EKG Interpretation None       Radiology Dg Chest 2 View  Result Date: 11/10/2016 CLINICAL DATA:  Patient with low lying.  Shortness of breath. EXAM: CHEST  2 VIEW COMPARISON:  Chest radiograph 05/26/2015. FINDINGS: Normal cardiac and mediastinal contours. Biapical pleuroparenchymal thickening. No consolidative pulmonary opacities. No pleural effusion or pneumothorax. Regional skeleton is unremarkable. IMPRESSION: No acute cardiopulmonary process. Electronically Signed   By: Lovey Newcomer M.D.   On: 11/10/2016 13:29    Procedures Procedures (including critical care time)  Medications Ordered in ED Medications - No data to display   Initial Impression / Assessment and Plan / ED Course  I have reviewed the triage vital signs and the nursing notes.  Pertinent labs & imaging results that were available during my care of the patient were reviewed by me and considered in my medical decision making (see chart for details).  46 year old female here with fatigue. She has history of anemia, had blood work done recently which revealed low iron and hemoglobin of 10. She was started on iron supplements which she has been taking, but reports upset stomach because of this. She is afebrile and nontoxic. She does not have any focal weakness on exam suggestive of CVA.  Labwork obtained, hemoglobin today is 11.2 which is an improvement from prior. No electrolyte disturbance. Troponin is negative. EKG is nonischemic. UA without signs of infection. Chest x-ray is clear. Patient remains neurologically intact here without focal deficit.  Suspect her fatigue is continued from her anemia. Her hemoglobin seems to be improving but is not yet back to normal. Encouraged her to continue her iron supplements-- take with good to prevent GI upset. She does not currently have a PCP so I have provided her with some information for local clinics for follow-up.  Discussed plan with patient, she acknowledged understanding and agreed with plan of care.  Return precautions given for new or worsening symptoms.  Final Clinical Impressions(s) / ED Diagnoses   Final diagnoses:  Fatigue, unspecified type  Anemia, unspecified type    New Prescriptions There are no discharge medications for this patient.    Larene Pickett, PA-C 11/10/16 1531  Veryl Speak, MD 11/12/16 [phone]

## 2016-11-10 NOTE — Discharge Instructions (Signed)
Your anemia seems to be improving based on your numbers today. Continue taking your iron supplements.  Make sure to take with food to reduce stomach upset. Recommend to establish care with local doctor.  I have attached list of primary care physician's at this location as well as other clinics in [place]. Please call to make appt. Return to the ED for new or worsening symptoms.

## 2016-11-10 NOTE — ED Triage Notes (Addendum)
C/o fatigue, "feeling down", head/chest congestion "ears feel closed up" x 6 days-hx of anemia-was seen at "clinic in [place]" 4 days ago and advised her iron level was low-pt NAD-steady gait-daughter with pt

## 2017-09-21 ENCOUNTER — Other Ambulatory Visit: Payer: Self-pay | Admitting: Obstetrics and Gynecology

## 2017-09-21 DIAGNOSIS — Z1231 Encounter for screening mammogram for malignant neoplasm of breast: Secondary | ICD-10-CM

## 2017-10-19 ENCOUNTER — Other Ambulatory Visit: Payer: Self-pay | Admitting: Physician Assistant

## 2017-10-19 ENCOUNTER — Ambulatory Visit
Admission: RE | Admit: 2017-10-19 | Discharge: 2017-10-19 | Disposition: A | Discharge status: Home / Self Care | Payer: BC Managed Care – PPO | Source: Ambulatory Visit | Attending: Physician Assistant | Admitting: Physician Assistant

## 2017-10-19 DIAGNOSIS — M542 Cervicalgia: Secondary | ICD-10-CM

## 2017-10-19 IMAGING — DX DG CERVICAL SPINE COMPLETE 4+V
5 series · 5 of 5 positions shown · non-contrast
Comparison: None.

CLINICAL DATA: Chronic posterior neck pain, no known injury,
initial encounter

EXAM:
CERVICAL SPINE - COMPLETE 4+ VIEW

[dg cervical spine complete (1 of 5)]
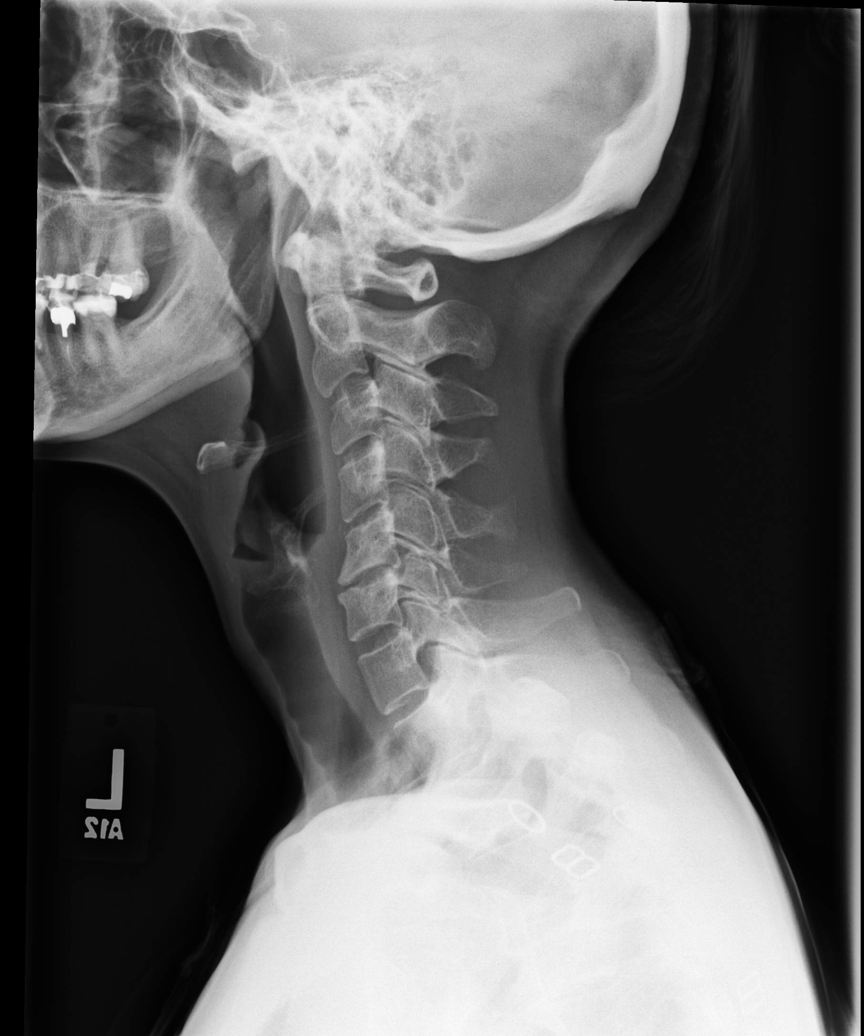

[dg cervical spine complete (2 of 5)]
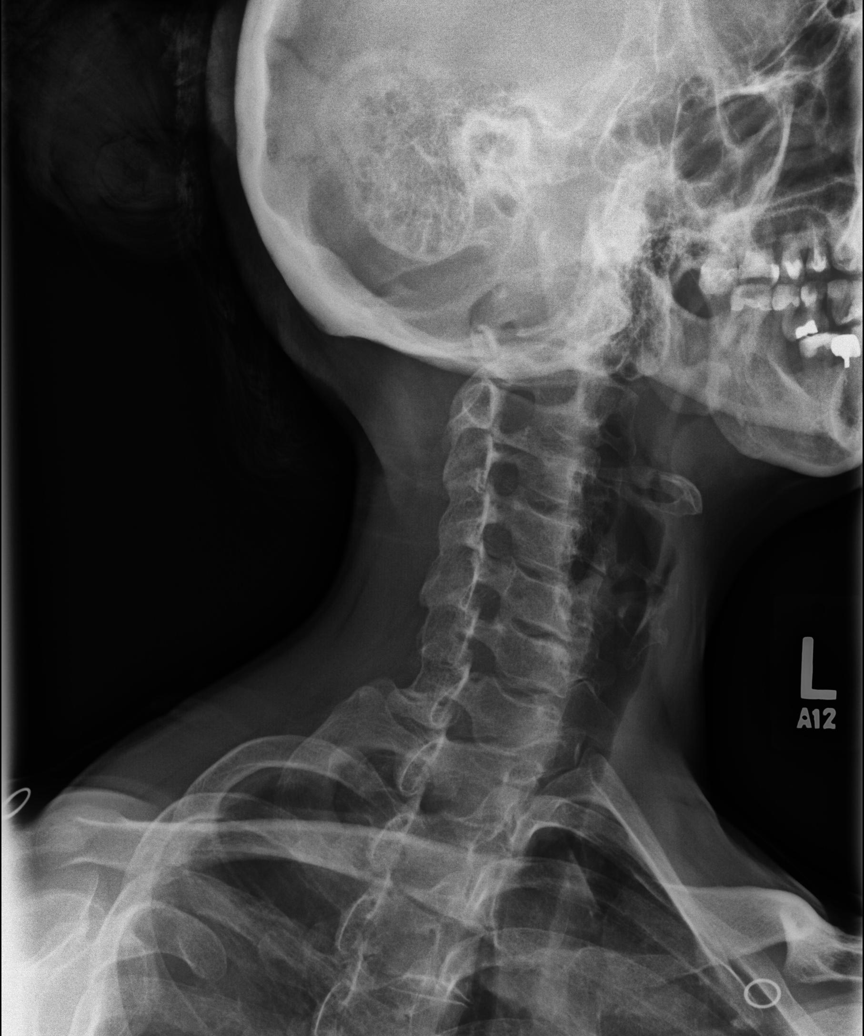

[dg cervical spine complete (3 of 5)]
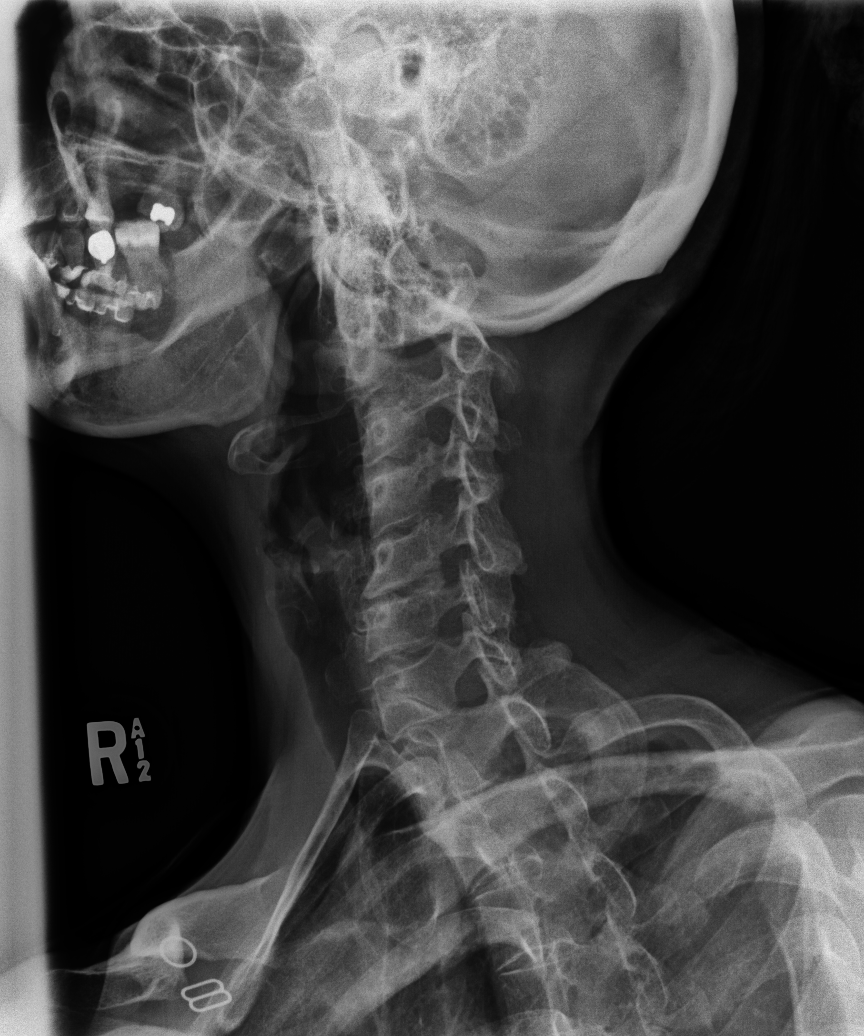

[dg cervical spine complete (4 of 5)]
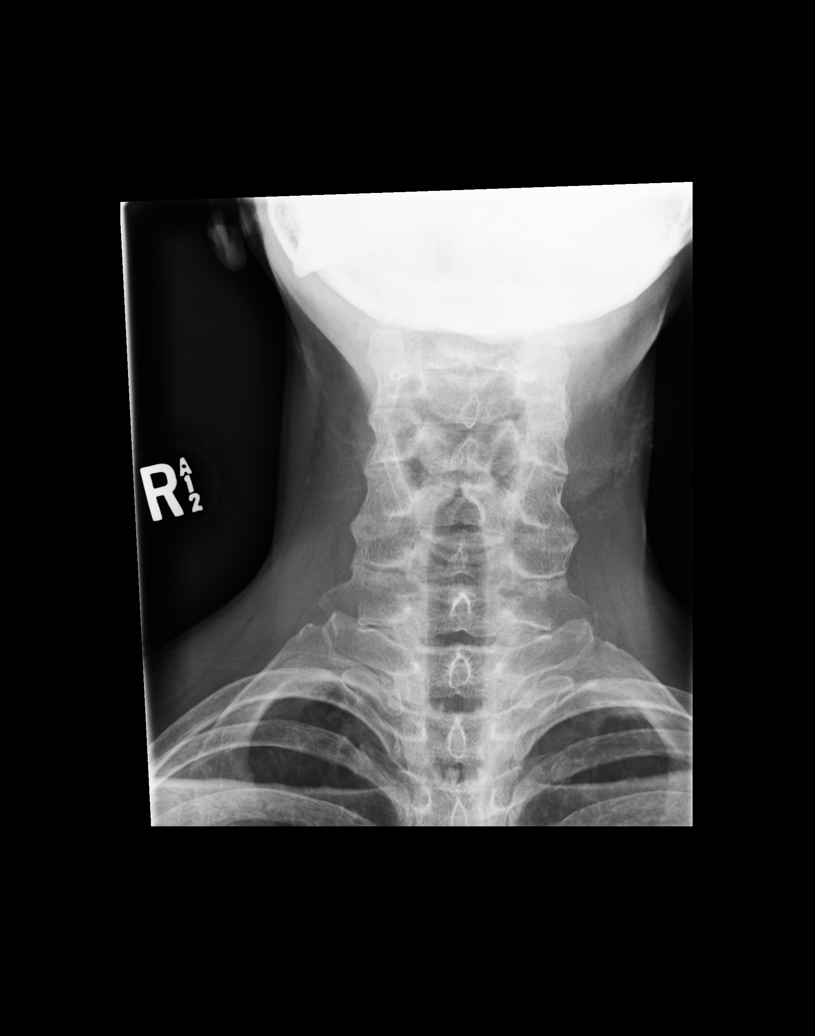

[dg cervical spine complete (5 of 5)]
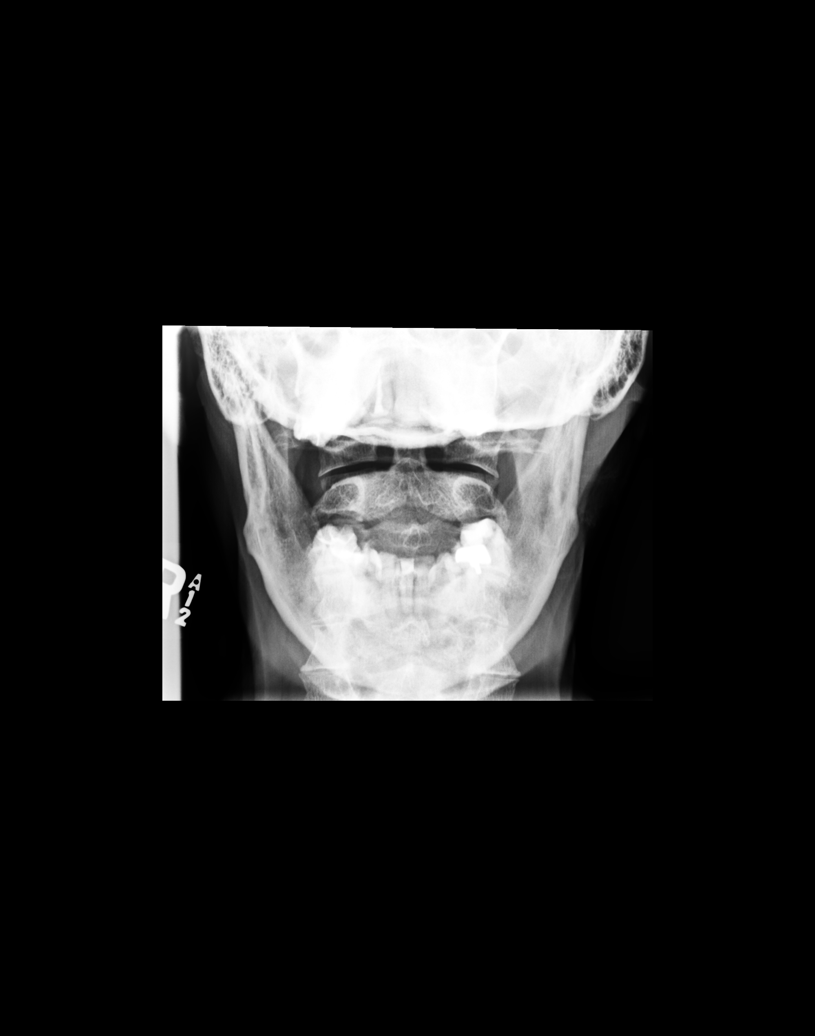

[5 of 5 positions shown; findings below may reference images not displayed]

FINDINGS: Seven cervical segments are well visualized. Mild disc space
narrowing is noted at C5-6 with mild osteophytic changes. No
significant neural foraminal narrowing is noted. No acute fracture
or acute facet abnormality is seen. The odontoid is within normal
limits. No soft tissue changes are seen.
IMPRESSION: Mild degenerative change at C5-6.

## 2017-10-20 ENCOUNTER — Ambulatory Visit
Admission: RE | Admit: 2017-10-20 | Discharge: 2017-10-20 | Disposition: A | Discharge status: Home / Self Care | Payer: BC Managed Care – PPO | Source: Ambulatory Visit | Attending: Obstetrics and Gynecology | Admitting: Obstetrics and Gynecology

## 2017-10-20 DIAGNOSIS — Z1231 Encounter for screening mammogram for malignant neoplasm of breast: Secondary | ICD-10-CM

## 2017-10-20 IMAGING — MG DIGITAL SCREENING BILATERAL MAMMOGRAM WITH CAD
4 series · 4 of 4 positions shown · non-contrast
Comparison: Previous exam(s).

CLINICAL DATA: Screening.

EXAM:
DIGITAL SCREENING BILATERAL MAMMOGRAM WITH CAD

[L MLO]
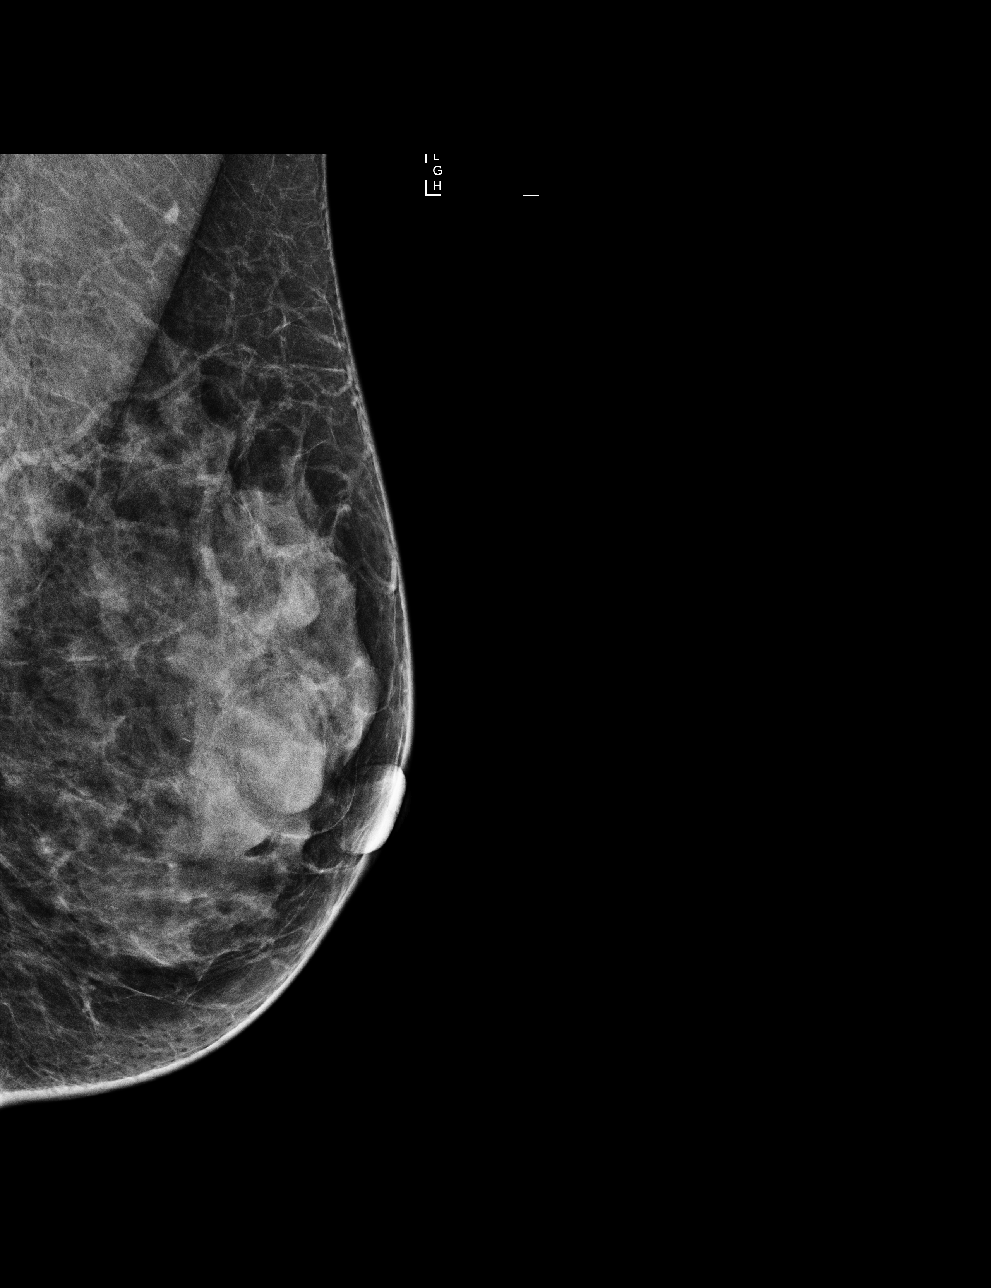

[R MLO]
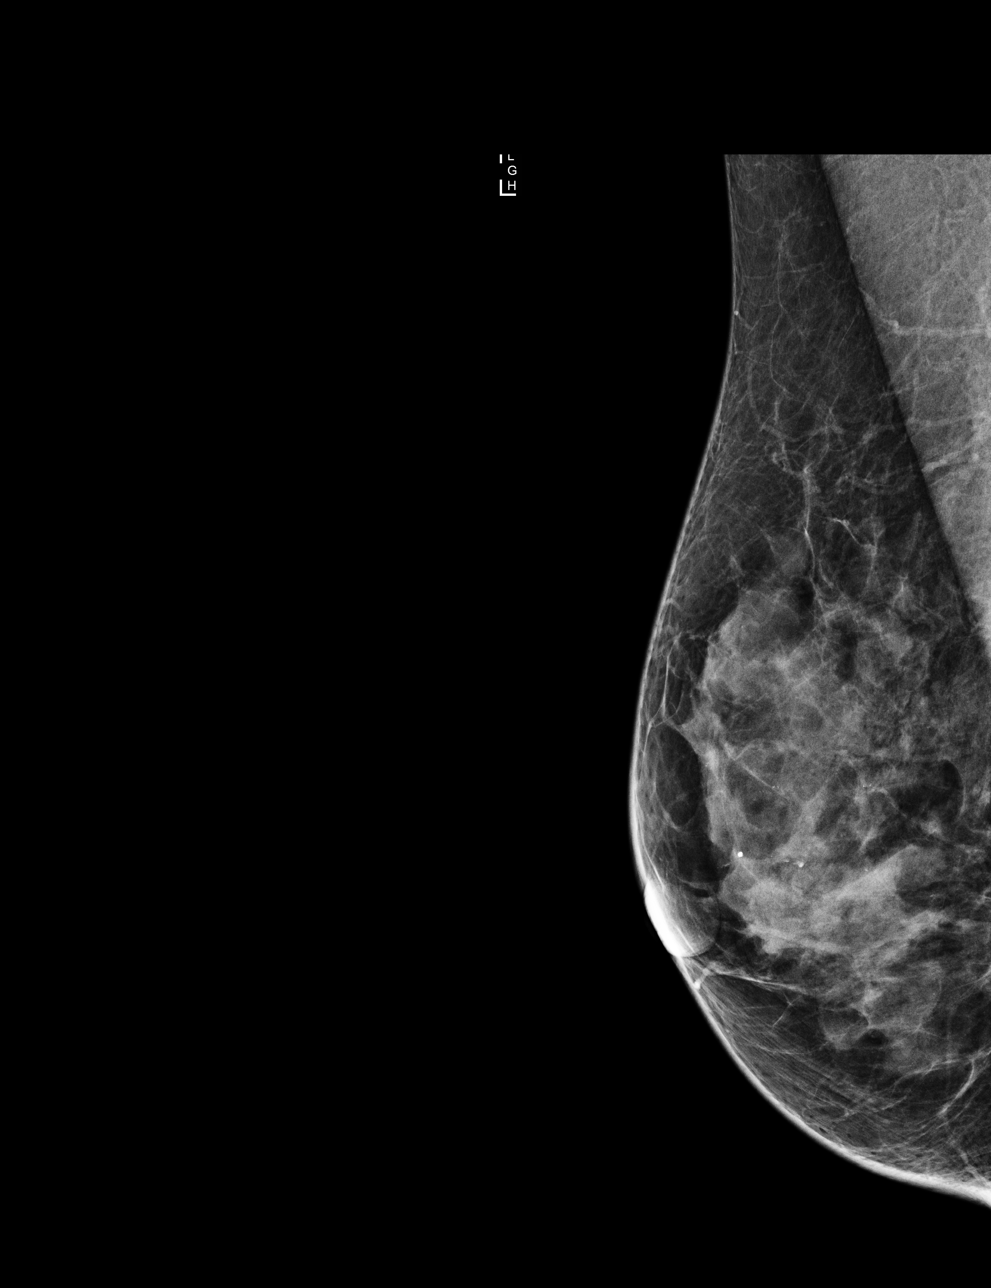

[L CC]
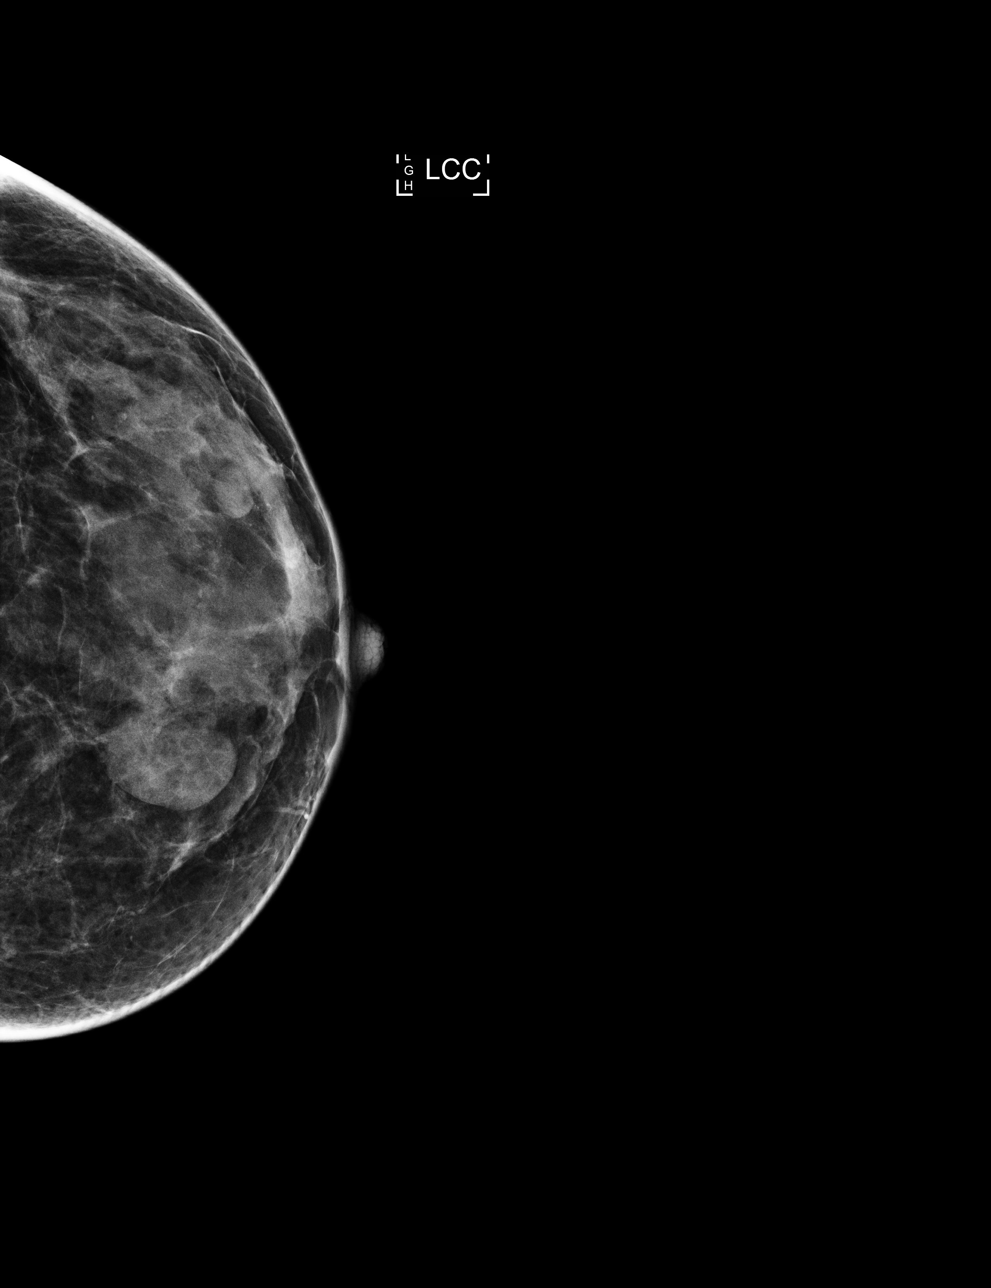

[R CC]
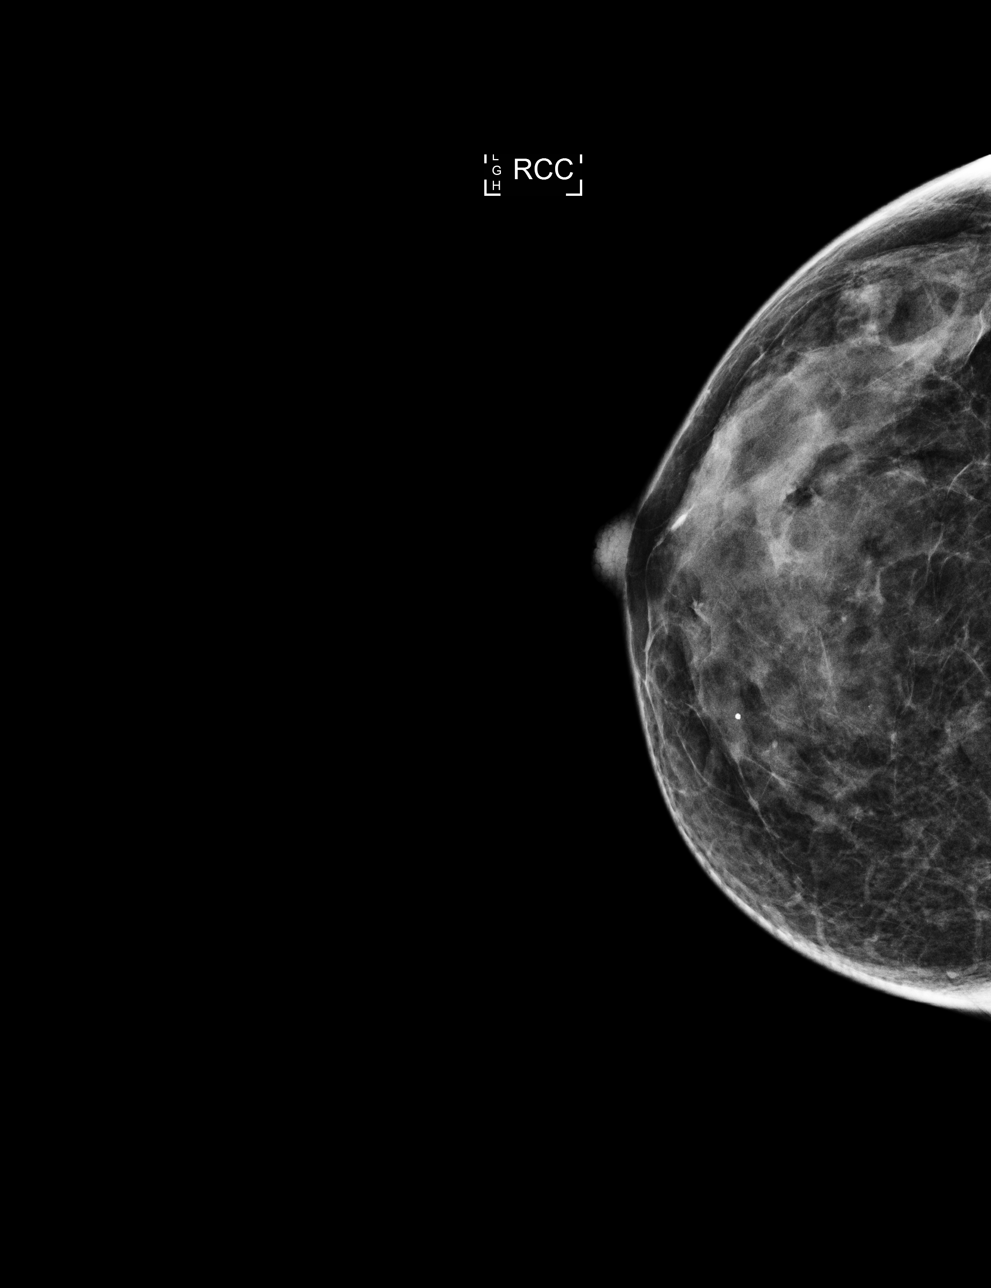

[4 of 4 positions shown; findings below may reference images not displayed]

ACR Breast Density Category c: The breast tissue is heterogeneously
dense, which may obscure small masses.
FINDINGS: There are no findings suspicious for malignancy. Images were
processed with CAD.
IMPRESSION: No mammographic evidence of malignancy. A result letter of this
screening mammogram will be mailed directly to the patient.

RECOMMENDATION:
Screening mammogram in one year. (Code:[0J])

BI-RADS CATEGORY  1: Negative.

## 2017-11-17 ENCOUNTER — Ambulatory Visit: Payer: BC Managed Care – PPO | Admitting: Obstetrics and Gynecology

## 2017-11-17 ENCOUNTER — Other Ambulatory Visit: Payer: Self-pay

## 2017-11-17 ENCOUNTER — Other Ambulatory Visit (HOSPITAL_COMMUNITY)
Admission: RE | Admit: 2017-11-17 | Discharge: 2017-11-17 | Disposition: A | Discharge status: Home / Self Care | Payer: BC Managed Care – PPO | Source: Ambulatory Visit | Attending: Obstetrics and Gynecology | Admitting: Obstetrics and Gynecology

## 2017-11-17 ENCOUNTER — Encounter: Payer: Self-pay | Admitting: Obstetrics and Gynecology

## 2017-11-17 VITALS — BP 108/60 | HR 68 | Resp 16 | Ht 65.0 in | Wt 130.0 lb

## 2017-11-17 DIAGNOSIS — D219 Benign neoplasm of connective and other soft tissue, unspecified: Secondary | ICD-10-CM | POA: Diagnosis not present

## 2017-11-17 DIAGNOSIS — Z1211 Encounter for screening for malignant neoplasm of colon: Secondary | ICD-10-CM

## 2017-11-17 DIAGNOSIS — Z01419 Encounter for gynecological examination (general) (routine) without abnormal findings: Secondary | ICD-10-CM

## 2017-11-17 DIAGNOSIS — N912 Amenorrhea, unspecified: Secondary | ICD-10-CM

## 2017-11-17 DIAGNOSIS — N852 Hypertrophy of uterus: Secondary | ICD-10-CM

## 2017-11-17 LAB — POCT URINE PREGNANCY: Preg Test, Ur: NEGATIVE

## 2017-11-17 NOTE — Progress Notes (Signed)
47 y.o. [id number] Widowed from [place] female here for annual exam.  Patient used to see [facility]; patient complains of not having a cycle since November 2018.  Eating more in the last months.  Menses were regular prior to November 2018. No hot flashes or night sweats.  No vaginal dryness.  No mood changes.  Wants her period back.   No sexual partner.   Took birth control in the past.  Felt she gained weight.  No hx HTN, liver disease, abnormal mammogram, DVT or PE or family hx thereof.  Does have migraine HA with aura by description.  Has migraine HA with caffeine deprivation.   Some stool with wiping to urinate. Unaware.  Can leak urine a little with coughing.   5 children.  Widow of second husband who had Devra Dopp disease.  Husband died 2 year ago.  Works as a Scientist, water quality at [employer].   UPT - negative.  PCP:   Maude Leriche, MD  Patient's last menstrual period was 10/20/2017.           Sexually active: No.  The current method of family planning is none.    Exercising: Yes.    walking Smoker:  Former  Health Maintenance: Pap:  About 2 years ago per patient History of abnormal Pap:  Yes.  No colposcopy or treatment.  MMG:  10/20/17 BIRADS 1 negative/density c.    Colonoscopy:  n/a BMD:   n/a  Result  n/a TDaP:  Up to date per patient Gardasil:   n/a HIV: 03/24/14 negative Hep C: unsure.  NA.  Screening Labs:  Discuss today   reports that she quit smoking about 5 years ago. Her smoking use included cigarettes. She has a 2.00 pack-year smoking history. she has never used smokeless tobacco. She reports that she does not drink alcohol or use drugs.  Past Medical History:  Diagnosis Date  . Anemia   . Fibroid     Past Surgical History:  Procedure Laterality Date  . TONSILLECTOMY      Current Outpatient Medications  Medication Sig Dispense Refill  . Multiple Vitamin (MULTIVITAMIN) tablet Take 1 tablet by mouth daily.     No current  facility-administered medications for this visit.     Family History  Problem Relation Age of Onset  . Hypertension Mother   . Cancer Father   . Diabetes Father   . Diabetes Brother     ROS:  Pertinent items are noted in HPI.  Otherwise, a comprehensive ROS was negative.  Exam:   BP 108/60 (BP Location: Right Arm, Patient Position: Sitting, Cuff Size: Normal)   Pulse 68   Resp 16   Ht 5\' 5"  (1.651 m)   Wt 130 lb (59 kg)   LMP 10/20/2017   BMI 21.63 kg/m     General appearance: alert, cooperative and appears stated age Head: Normocephalic, without obvious abnormality, atraumatic Neck: no adenopathy, supple, symmetrical, trachea midline and thyroid normal to inspection and palpation Lungs: clear to auscultation bilaterally Breasts: normal appearance, no masses or tenderness, No nipple retraction or dimpling, No nipple discharge or bleeding, No axillary or supraclavicular adenopathy Heart: regular rate and rhythm Abdomen: soft, non-tender; no masses, no organomegaly Extremities: extremities normal, atraumatic, no cyanosis or edema Skin: Skin color, texture, turgor normal. No rashes or lesions Lymph nodes: Cervical, supraclavicular, and axillary nodes normal. No abnormal inguinal nodes palpated Neurologic: Grossly normal  Pelvic: External genitalia:  no lesions  Urethra:  normal appearing urethra with no masses, tenderness or lesions              Bartholins and Skenes: normal                 Vagina: normal appearing vagina with normal color and discharge, no lesions              Cervix: no lesions              Pap taken: Yes.   Bimanual Exam:  Uterus:   15 week size uterus.   Good Kegel.               Adnexa: no mass, fullness, tenderness              Rectal exam: Yes.  .  Confirms.              Anus:  normal sphincter tone, no lesions  Chaperone was present for exam.  Assessment:   Well woman visit with normal exam. Oligomenorrhea.  Remote hx of abnormal  pap.  May have migraine with aura.  Fecal incontinence.  Urinary stress incontinence.  Enlarged uterus.   Hx fibroid.  Plan: Mammogram screening discussed. Recommended self breast awareness. Pap and HR HPV as above. Guidelines for Calcium, Vitamin D, regular exercise program including cardiovascular and weight bearing exercise. Routine labs, FSH, estradiol, TSH, prolactin.  Kegel's.  Metamucil. Referral for colonoscopy.  Return for pelvic US.  Follow up annually and prn.   After visit summary provided.

## 2017-11-17 NOTE — Patient Instructions (Signed)
Try Metamucil for fecal soiling.    Kegel Exercises Kegel exercises help strengthen the muscles that support the rectum, vagina, small intestine, bladder, and uterus. Doing Kegel exercises can help:  Improve bladder and bowel control.  Improve sexual response.  Reduce problems and discomfort during pregnancy.  Kegel exercises involve squeezing your pelvic floor muscles, which are the same muscles you squeeze when you try to stop the flow of urine. The exercises can be done while sitting, standing, or lying down, but it is best to vary your position. Phase 1 exercises 1. Squeeze your pelvic floor muscles tight. You should feel a tight lift in your rectal area. If you are a female, you should also feel a tightness in your vaginal area. Keep your stomach, buttocks, and legs relaxed. 2. Hold the muscles tight for up to 10 seconds. 3. Relax your muscles. Repeat this exercise 50 times a day or as many times as told by your health care provider. Continue to do this exercise for at least 4-6 weeks or for as long as told by your health care provider. This information is not intended to replace advice given to you by your health care provider. Make sure you discuss any questions you have with your health care provider. Document Released: 09/12/2012 Document Revised: 05/21/2016 Document Reviewed: 08/16/2015 Elsevier Interactive Patient Education  2018 [employer] AND DIET:  We recommended that you start or continue a regular exercise program for good health. Regular exercise means any activity that makes your heart beat faster and makes you sweat.  We recommend exercising at least 30 minutes per day at least 3 days a week, preferably 4 or 5.  We also recommend a diet low in fat and sugar.  Inactivity, poor dietary choices and obesity can cause diabetes, heart attack, stroke, and kidney damage, among others.    ALCOHOL AND SMOKING:  Women should limit their alcohol intake to no more than 7  drinks/beers/glasses of wine (combined, not each!) per week. Moderation of alcohol intake to this level decreases your risk of breast cancer and liver damage. And of course, no recreational drugs are part of a healthy lifestyle.  And absolutely no smoking or even second hand smoke. Most people know smoking can cause heart and lung diseases, but did you know it also contributes to weakening of your bones? Aging of your skin?  Yellowing of your teeth and nails?  CALCIUM AND VITAMIN D:  Adequate intake of calcium and Vitamin D are recommended.  The recommendations for exact amounts of these supplements seem to change often, but generally speaking 600 mg of calcium (either carbonate or citrate) and 800 units of Vitamin D per day seems prudent. Certain women may benefit from higher intake of Vitamin D.  If you are among these women, your doctor will have told you during your visit.    PAP SMEARS:  Pap smears, to check for cervical cancer or precancers,  have traditionally been done yearly, although recent scientific advances have shown that most women can have pap smears less often.  However, every woman still should have a physical exam from her gynecologist every year. It will include a breast check, inspection of the vulva and vagina to check for abnormal growths or skin changes, a visual exam of the cervix, and then an exam to evaluate the size and shape of the uterus and ovaries.  And after 48 years of age, a rectal exam is indicated to check for rectal cancers. We will  also provide age appropriate advice regarding health maintenance, like when you should have certain vaccines, screening for sexually transmitted diseases, bone density testing, colonoscopy, mammograms, etc.   MAMMOGRAMS:  All women over 43 years old should have a yearly mammogram. Many facilities now offer a "3D" mammogram, which may cost around $50 extra out of pocket. If possible,  we recommend you accept the option to have the 3D mammogram  performed.  It both reduces the number of women who will be called back for extra views which then turn out to be normal, and it is better than the routine mammogram at detecting truly abnormal areas.    COLONOSCOPY:  Colonoscopy to screen for colon cancer is recommended for all women at age 50.  We know, you hate the idea of the prep.  We agree, BUT, having colon cancer and not knowing it is worse!!  Colon cancer so often starts as a polyp that can be seen and removed at colonscopy, which can quite literally save your life!  And if your first colonoscopy is normal and you have no family history of colon cancer, most women don't have to have it again for 10 years.  Once every ten years, you can do something that may end up saving your life, right?  We will be happy to help you get it scheduled when you are ready.  Be sure to check your insurance coverage so you understand how much it will cost.  It may be covered as a preventative service at no cost, but you should check your particular policy.

## 2017-11-18 LAB — TSH: TSH: 2.6 u[IU]/mL (ref 0.450–4.500)

## 2017-11-18 LAB — LIPID PANEL
CHOL/HDL RATIO: 2 ratio (ref 0.0–4.4)
CHOLESTEROL TOTAL: 153 mg/dL (ref 100–199)
HDL: 77 mg/dL (ref >39)
LDL CALC: 67 mg/dL (ref 0–99)
TRIGLYCERIDES: 47 mg/dL (ref 0–149)
VLDL Cholesterol Cal: 9 mg/dL (ref 5–40)

## 2017-11-18 LAB — PROLACTIN: Prolactin: 17.4 ng/mL (ref 4.8–23.3)

## 2017-11-18 LAB — COMPREHENSIVE METABOLIC PANEL
ALBUMIN: 4.2 g/dL (ref 3.5–5.5)
ALK PHOS: 51 IU/L (ref 39–117)
ALT: 15 IU/L (ref 0–32)
AST: 22 IU/L (ref 0–40)
Albumin/Globulin Ratio: 1.8 (ref 1.2–2.2)
BUN / CREAT RATIO: 22 (ref 9–23)
BUN: 15 mg/dL (ref 6–24)
Bilirubin Total: 0.6 mg/dL (ref 0.0–1.2)
CO2: 24 mmol/L (ref 20–29)
CREATININE: 0.67 mg/dL (ref 0.57–1.00)
Calcium: 9.1 mg/dL (ref 8.7–10.2)
Chloride: 101 mmol/L (ref 96–106)
GFR calc Af Amer: 121 mL/min/{1.73_m2} (ref >59)
GFR calc non Af Amer: 105 mL/min/{1.73_m2} (ref >59)
GLOBULIN, TOTAL: 2.4 g/dL (ref 1.5–4.5)
GLUCOSE: 76 mg/dL (ref 65–99)
Potassium: 4 mmol/L (ref 3.5–5.2)
SODIUM: 138 mmol/L (ref 134–144)
TOTAL PROTEIN: 6.6 g/dL (ref 6.0–8.5)

## 2017-11-18 LAB — CBC
HEMOGLOBIN: 11.4 g/dL (ref 11.1–15.9)
Hematocrit: 34.9 % (ref 34.0–46.6)
MCH: 29.9 pg (ref 26.6–33.0)
MCHC: 32.7 g/dL (ref 31.5–35.7)
MCV: 92 fL (ref 79–97)
Platelets: 271 10*3/uL (ref 150–379)
RBC: 3.81 x10E6/uL (ref 3.77–5.28)
RDW: 14.2 % (ref 12.3–15.4)
WBC: 5.2 10*3/uL (ref 3.4–10.8)

## 2017-11-18 LAB — FSH/LH
FSH: 22.7 m[IU]/mL
LH: 32.6 m[IU]/mL

## 2017-11-18 LAB — ESTRADIOL: ESTRADIOL: 335.9 pg/mL

## 2017-11-21 ENCOUNTER — Telehealth: Payer: Self-pay | Admitting: *Deleted

## 2017-11-21 ENCOUNTER — Telehealth: Payer: Self-pay | Admitting: Obstetrics and Gynecology

## 2017-11-21 LAB — CYTOLOGY - PAP
DIAGNOSIS: NEGATIVE
HPV: NOT DETECTED

## 2017-11-21 NOTE — Telephone Encounter (Signed)
Spoke with patient. Results given. Patient verbalizes understanding. Is scheduled for PUS on 11/30/2017 and will keep this as scheduled. Encounter closed.

## 2017-11-21 NOTE — Telephone Encounter (Signed)
Patient returned call. Reviewed benefit for recommended ultrasound. Patient understood and agreeable. Patient ready to schedule. Patient scheduled 11/30/17 with Dr Quincy Simmonds. Patient aware of appointment date, arrival time and cancellation policy. No further questions. Ok to close

## 2017-11-21 NOTE — Telephone Encounter (Signed)
-----   Message from Nunzio Cobbs, MD sent at 11/20/2017  5:01 PM EST ----- Please report labs to patient.  Her FSH and LH are elevated and her estrogen level is normal.  She is perimenopausal.  This is the reason for her skipped menstruation.   Her prolactin and thyroid tests are normal.   Her metabolic profile, cholesterol, and blood counts are all normal.   She will return to the office for a pelvic ultrasound, and this should be in precert.

## 2017-11-21 NOTE — Telephone Encounter (Signed)
Call placed to patient to review benefits for recommended ultrasound. Left voicemail message requesting a return call  °

## 2017-11-21 NOTE — Telephone Encounter (Signed)
Notes recorded by Burnice Logan, RN on 11/21/2017 at 9:54 AM EST Left message to call Sharee Pimple at [phone].

## 2017-11-30 ENCOUNTER — Encounter: Payer: Self-pay | Admitting: Obstetrics and Gynecology

## 2017-11-30 ENCOUNTER — Ambulatory Visit: Payer: BC Managed Care – PPO | Admitting: Obstetrics and Gynecology

## 2017-11-30 ENCOUNTER — Telehealth: Payer: Self-pay | Admitting: Obstetrics and Gynecology

## 2017-11-30 ENCOUNTER — Ambulatory Visit (INDEPENDENT_AMBULATORY_CARE_PROVIDER_SITE_OTHER): Payer: BC Managed Care – PPO

## 2017-11-30 ENCOUNTER — Other Ambulatory Visit: Payer: Self-pay

## 2017-11-30 VITALS — BP 102/60 | HR 72 | Resp 16 | Wt 130.0 lb

## 2017-11-30 DIAGNOSIS — D219 Benign neoplasm of connective and other soft tissue, unspecified: Secondary | ICD-10-CM | POA: Diagnosis not present

## 2017-11-30 DIAGNOSIS — N852 Hypertrophy of uterus: Secondary | ICD-10-CM

## 2017-11-30 DIAGNOSIS — D259 Leiomyoma of uterus, unspecified: Secondary | ICD-10-CM | POA: Insufficient documentation

## 2017-11-30 DIAGNOSIS — N951 Menopausal and female climacteric states: Secondary | ICD-10-CM

## 2017-11-30 NOTE — Progress Notes (Signed)
Encounter reviewed by Dr. Brook Amundson C. Silva.  

## 2017-11-30 NOTE — Telephone Encounter (Addendum)
Patient failed to stop by check out 11/30/17 to sign a medical release for ultrasound reports from [place]. I left patient a message to please call and specify if she would like ot come in and sign or can mail to patient's home. Medical release prepared in patient pick up drawer.

## 2017-11-30 NOTE — Telephone Encounter (Signed)
Patient returned call states she would like to have medical release form mailed to her and then she will mail it back to our office. I have confirmed patients address to be correct in the demographics. Release form mailed to patients home address.   cc: Thora Lance

## 2017-11-30 NOTE — Progress Notes (Signed)
GYNECOLOGY  VISIT   HPI: 47 y.o.   Widowed  [place]  female   [phone] with Patient's last menstrual period was 11/23/2017.   here for ultrasound for enlarged uterus.  Skipping menses. Her prior LMP was November 2018.  Just had a menstrual cycle again.  Estradiol 335.9. FSH 22.7 and LH 32.6. Normal TSH and prolactin.   Possible migraine with aura.  GYNECOLOGIC HISTORY: Patient's last menstrual period was 11/23/2017. Contraception:  none Menopausal hormone therapy:  none Last mammogram:  10/20/17 BIRADS 1 negative/density c Last pap smear:   11/17/17 Pap and HR HPV negative        OB History    Gravida Para Term Preterm AB Living   5 5 5     5    SAB TAB Ectopic Multiple Live Births         1           Patient Active Problem List   Diagnosis Date Noted  . Symptomatic anemia 08/02/2016    Past Medical History:  Diagnosis Date  . Anemia   . Fibroid     Past Surgical History:  Procedure Laterality Date  . TONSILLECTOMY      Current Outpatient Medications  Medication Sig Dispense Refill  . Multiple Vitamin (MULTIVITAMIN) tablet Take 1 tablet by mouth daily.     No current facility-administered medications for this visit.      ALLERGIES: Patient has no known allergies.  Family History  Problem Relation Age of Onset  . Hypertension Mother   . Cancer Father   . Diabetes Father   . Diabetes Brother     Social History   Socioeconomic History  . Marital status: Widowed    Spouse name: Not on file  . Number of children: Not on file  . Years of education: Not on file  . Highest education level: Not on file  Social Needs  . Financial resource strain: Not on file  . Food insecurity - worry: Not on file  . Food insecurity - inability: Not on file  . Transportation needs - medical: Not on file  . Transportation needs - non-medical: Not on file  Occupational History  . Occupation: Scientist, water quality    Comment: [employer], [employer]  Tobacco Use  . Smoking  status: Former Smoker    Packs/day: 0.25    Years: 8.00    Pack years: 2.00    Types: Cigarettes    Last attempt to quit: 10/10/2012    Years since quitting: 5.1  . Smokeless tobacco: Never Used  Substance and Sexual Activity  . Alcohol use: No    Alcohol/week: 0.0 oz  . Drug use: No  . Sexual activity: No    Birth control/protection: None  Other Topics Concern  . Not on file  Social History Narrative   Widowed 2015-12-17. Husband died from [place] disease.     ROS:  Pertinent items are noted in HPI.  PHYSICAL EXAMINATION:    BP 102/60 (BP Location: Left Arm, Patient Position: Sitting, Cuff Size: Normal)   Pulse 72   Resp 16   Wt 130 lb (59 kg)   LMP 11/23/2017   BMI 21.63 kg/m     General appearance: alert, cooperative and appears stated age    Pelvic US Uterine fibroid 71 mm EMS 8.34 mm.  Right ovarian follicles.  Normal left ovary.  No free fluid.   ASSESSMENT  Perimenopausal female.  Uterine fibroid. Possible migraine with aura.   PLAN  We discussed perimenopause and fibroids. We discussed rare risk of sarcoma.  Will get prior US reports to understand the life history of her fibroid. She will call if she has skipped cycle for 3 months or prolonged bleeding.  FU in 3 months for an ultrasound recheck.   An After Visit Summary was printed and given to the patient.  __15____ minutes face to face time of which over 50% was spent in counseling.

## 2017-11-30 NOTE — Patient Instructions (Addendum)
Please call me if you skip your menstruation for 3 months or if you have heavy or prolonged bleeding.   I will see you back in 3 months for a repeat ultrasound.   Try Metamucil capsules or powder for treating your fecal incontinence. You can start by using this twice a week.  You may be able to eventually take this daily.

## 2017-12-21 NOTE — Telephone Encounter (Signed)
Patient returned my call stating that she did receive the medical release and forgot to mail back to me. Patient said " I will sign the release and mail to you tomorrow".

## 2017-12-21 NOTE — Telephone Encounter (Signed)
I have not received the medical release from the patient. I left her a message to call and confirm thet she received the medical release mailed to her.Marland Kitchen

## 2018-01-15 ENCOUNTER — Encounter: Payer: Self-pay | Admitting: Obstetrics and Gynecology

## 2018-01-15 ENCOUNTER — Telehealth: Payer: Self-pay | Admitting: Obstetrics and Gynecology

## 2018-01-15 ENCOUNTER — Ambulatory Visit: Payer: BC Managed Care – PPO | Admitting: Obstetrics and Gynecology

## 2018-01-15 ENCOUNTER — Other Ambulatory Visit: Payer: Self-pay

## 2018-01-15 VITALS — BP 110/58 | HR 70 | Ht 65.0 in | Wt 130.8 lb

## 2018-01-15 DIAGNOSIS — R319 Hematuria, unspecified: Secondary | ICD-10-CM | POA: Diagnosis not present

## 2018-01-15 DIAGNOSIS — N390 Urinary tract infection, site not specified: Secondary | ICD-10-CM | POA: Diagnosis not present

## 2018-01-15 DIAGNOSIS — N760 Acute vaginitis: Secondary | ICD-10-CM

## 2018-01-15 DIAGNOSIS — R300 Dysuria: Secondary | ICD-10-CM | POA: Diagnosis not present

## 2018-01-15 LAB — POCT URINALYSIS DIPSTICK
BILIRUBIN UA: NEGATIVE
GLUCOSE UA: NEGATIVE
KETONES UA: NEGATIVE
Nitrite, UA: NEGATIVE
Protein, UA: NEGATIVE
Urobilinogen, UA: 0.2 E.U./dL
pH, UA: 5 (ref 5.0–8.0)

## 2018-01-15 MED ORDER — FLUCONAZOLE 150 MG PO TABS: ORAL_TABLET | ORAL | 0 refills | Status: DC

## 2018-01-15 MED ORDER — NYSTATIN-TRIAMCINOLONE 100000-0.1 UNIT/GM-% EX CREA: 1.0000 "application " | TOPICAL_CREAM | Freq: Two times a day (BID) | CUTANEOUS | 0 refills | Status: DC

## 2018-01-15 MED ORDER — SULFAMETHOXAZOLE-TRIMETHOPRIM 800-160 MG PO TABS: 1.0000 | ORAL_TABLET | Freq: Two times a day (BID) | ORAL | 0 refills | Status: DC

## 2018-01-15 NOTE — Patient Instructions (Signed)
Urinary Tract Infection, Adult A urinary tract infection (UTI) is an infection of any part of the urinary tract. The urinary tract includes the:  Kidneys.  Ureters.  Bladder.  Urethra.  These organs make, store, and get rid of pee (urine) in the body. Follow these instructions at home:  Take over-the-counter and prescription medicines only as told by your doctor.  If you were prescribed an antibiotic medicine, take it as told by your doctor. Do not stop taking the antibiotic even if you start to feel better.  Avoid the following drinks: ? Alcohol. ? Caffeine. ? Tea. ? Carbonated drinks.  Drink enough fluid to keep your pee clear or pale yellow.  Keep all follow-up visits as told by your doctor. This is important.  Make sure to: ? Empty your bladder often and completely. Do not to hold pee for long periods of time. ? Empty your bladder before and after sex. ? Wipe from front to back after a bowel movement if you are female. Use each tissue one time when you wipe. Contact a doctor if:  You have back pain.  You have a fever.  You feel sick to your stomach (nauseous).  You throw up (vomit).  Your symptoms do not get better after 3 days.  Your symptoms go away and then come back. Get help right away if:  You have very bad back pain.  You have very bad lower belly (abdominal) pain.  You are throwing up and cannot keep down any medicines or water. This information is not intended to replace advice given to you by your health care provider. Make sure you discuss any questions you have with your health care provider. Document Released: 03/14/2008 Document Revised: 03/03/2016 Document Reviewed: 08/17/2015 Elsevier Interactive Patient Education  2018 [employer]. Vaginitis Vaginitis is a condition in which the vaginal tissue swells and becomes red (inflamed). This condition is most often caused by a change in the normal balance of bacteria and yeast that live in the  vagina. This change causes an overgrowth of certain bacteria or yeast, which causes the inflammation. There are different types of vaginitis, but the most common types are:  Bacterial vaginosis.  Yeast infection (candidiasis).  Trichomoniasis vaginitis. This is a sexually transmitted disease (STD).  Viral vaginitis.  Atrophic vaginitis.  Allergic vaginitis.  What are the causes? The cause of this condition depends on the type of vaginitis. It can be caused by:  Bacteria (bacterial vaginosis).  Yeast, which is a fungus (yeast infection).  A parasite (trichomoniasis vaginitis).  A virus (viral vaginitis).  Low hormone levels (atrophic vaginitis). Low hormone levels can occur during pregnancy, breastfeeding, or after menopause.  Irritants, such as bubble baths, scented tampons, and feminine sprays (allergic vaginitis).  Other factors can change the normal balance of the yeast and bacteria that live in the vagina. These include:  Antibiotic medicines.  Poor hygiene.  Diaphragms, vaginal sponges, spermicides, birth control pills, and intrauterine devices (IUD).  Sex.  Infection.  Uncontrolled diabetes.  A weakened defense (immune) system.  What increases the risk? This condition is more likely to develop in women who:  Smoke.  Use vaginal douches, scented tampons, or scented sanitary pads.  Wear tight-fitting pants.  Wear thong underwear.  Use oral birth control pills or an IUD.  Have sex without a condom.  Have multiple sex partners.  Have an STD.  Frequently use the spermicide nonoxynol-9.  Eat lots of foods high in sugar.  Have uncontrolled diabetes.  Have  low estrogen levels.  Have a weakened immune system from an immune disorder or medical treatment.  Are pregnant or breastfeeding.  What are the signs or symptoms? Symptoms vary depending on the cause of the vaginitis. Common symptoms include:  Abnormal vaginal discharge. ? The discharge  is white, gray, or yellow with bacterial vaginosis. ? The discharge is thick, white, and cheesy with a yeast infection. ? The discharge is frothy and yellow or greenish with trichomoniasis.  A bad vaginal smell. The smell is fishy with bacterial vaginosis.  Vaginal itching, pain, or swelling.  Sex that is painful.  Pain or burning when urinating.  Sometimes there are no symptoms. How is this diagnosed? This condition is diagnosed based on your symptoms and medical history. A physical exam, including a pelvic exam, will also be done. You may also have other tests, including:  Tests to determine the pH level (acidity or alkalinity) of your vagina.  A whiff test, to assess the odor that results when a sample of your vaginal discharge is mixed with a potassium hydroxide solution.  Tests of vaginal fluid. A sample will be examined under a microscope.  How is this treated? Treatment varies depending on the type of vaginitis you have. Your treatment may include:  Antibiotic creams or pills to treat bacterial vaginosis and trichomoniasis.  Antifungal medicines, such as vaginal creams or suppositories, to treat a yeast infection.  Medicine to ease discomfort if you have viral vaginitis. Your sexual partner should also be treated.  Estrogen delivered in a cream, pill, suppository, or vaginal ring to treat atrophic vaginitis. If vaginal dryness occurs, lubricants and moisturizing creams may help. You may need to avoid scented soaps, sprays, or douches.  Stopping use of a product that is causing allergic vaginitis. Then using a vaginal cream to treat the symptoms.  Follow these instructions at home: Lifestyle  Keep your genital area clean and dry. Avoid soap, and only rinse the area with water.  Do not douche or use tampons until your health care provider says it is okay to do so. Use sanitary pads, if needed.  Do not have sex until your health care provider approves. When you can  return to sex, practice safe sex and use condoms.  Wipe from front to back. This avoids the spread of bacteria from the rectum to the vagina. General instructions  Take over-the-counter and prescription medicines only as told by your health care provider.  If you were prescribed an antibiotic medicine, take or use it as told by your health care provider. Do not stop taking or using the antibiotic even if you start to feel better.  Keep all follow-up visits as told by your health care provider. This is important. How is this prevented?  Use mild, non-scented products. Do not use things that can irritate the vagina, such as fabric softeners. Avoid the following products if they are scented: ? Feminine sprays. ? Detergents. ? Tampons. ? Feminine hygiene products. ? Soaps or bubble baths.  Let air reach your genital area. ? Wear cotton underwear to reduce moisture buildup. ? Avoid wearing underwear while you sleep. ? Avoid wearing tight pants and underwear or nylons without a cotton panel. ? Avoid wearing thong underwear.  Take off any wet clothing, such as bathing suits, as soon as possible.  Practice safe sex and use condoms. Contact a health care provider if:  You have abdominal pain.  You have a fever.  You have symptoms that last for more than  2-3 days. Get help right away if:  You have a fever and your symptoms suddenly get worse. Summary  Vaginitis is a condition in which the vaginal tissue becomes inflamed.This condition is most often caused by a change in the normal balance of bacteria and yeast that live in the vagina.  Treatment varies depending on the type of vaginitis you have.  Do not douche, use tampons , or have sex until your health care provider approves. When you can return to sex, practice safe sex and use condoms. This information is not intended to replace advice given to you by your health care provider. Make sure you discuss any questions you have with  your health care provider. Document Released: 07/24/2007 Document Revised: 11/01/2016 Document Reviewed: 11/01/2016 Elsevier Interactive Patient Education  [employer].

## 2018-01-15 NOTE — Telephone Encounter (Signed)
Spoke with patient. Patient states that vaginal itching and burning started about a week ago. Over the last 3 days has worsened. Denies vaginal discharge. No change recently to soap or detergent. Requesting an appointment today. Advised will need to review with Dr.Silva and return call. Patient is agreeable.  Dr.Silva, please advise. No openings today.

## 2018-01-15 NOTE — Progress Notes (Signed)
GYNECOLOGY  VISIT   HPI: 47 y.o.   Widowed  [place]  female   [id number] with Patient's last menstrual period was 11/23/2017 (exact date).   here for vaginal itching and burning.    Three days ago started having itching and burning.  Most symptoms are external.  No abx or steroids.   White discharge from vagina. Some odor.   No change in exposures.  Not sexually active.   States she had this problem in the past.  Reports it took a year to feel better.  Believes it was an infection.   No vaginal bleeding today.   Little burning with urination.    GYNECOLOGIC HISTORY: Patient's last menstrual period was 11/23/2017 (exact date). Contraception:  None Menopausal hormone therapy:  none Last mammogram:  10/20/17 BIRADS 1 negative/density c.    Last pap smear:  11-17-17 Neg:Neg HR HPV        OB History    Gravida  5   Para  5   Term  5   Preterm      AB      Living  5     SAB      TAB      Ectopic      Multiple  1   Live Births                 Patient Active Problem List   Diagnosis Date Noted  . Uterine leiomyoma 11/30/2017  . Symptomatic anemia 08/02/2016    Past Medical History:  Diagnosis Date  . Anemia   . Fibroid     Past Surgical History:  Procedure Laterality Date  . TONSILLECTOMY      Current Outpatient Medications  Medication Sig Dispense Refill  . Multiple Vitamin (MULTIVITAMIN) tablet Take 1 tablet by mouth daily.    Marland Kitchen UNABLE TO FIND Med Name: sleeping medication from another country     No current facility-administered medications for this visit.      ALLERGIES: Patient has no known allergies.  Family History  Problem Relation Age of Onset  . Hypertension Mother   . Cancer Father   . Diabetes Father   . Diabetes Brother     Social History   Socioeconomic History  . Marital status: Widowed    Spouse name: Not on file  . Number of children: Not on file  . Years of education: Not on file  . Highest education level:  Not on file  Occupational History  . Occupation: Scientist, water quality    Comment: [employer]  . Financial resource strain: Not on file  . Food insecurity:    Worry: Not on file    Inability: Not on file  . Transportation needs:    Medical: Not on file    Non-medical: Not on file  Tobacco Use  . Smoking status: Former Smoker    Packs/day: 0.25    Years: 8.00    Pack years: 2.00    Types: Cigarettes    Last attempt to quit: 10/10/2012    Years since quitting: 5.2  . Smokeless tobacco: Never Used  Substance and Sexual Activity  . Alcohol use: No    Alcohol/week: 0.0 oz  . Drug use: No  . Sexual activity: Never    Birth control/protection: None  Lifestyle  . Physical activity:    Days per week: Not on file    Minutes per session: Not on file  . Stress: Not on file  Relationships  .  Social connections:    Talks on phone: Not on file    Gets together: Not on file    Attends religious service: Not on file    Active member of club or organization: Not on file    Attends meetings of clubs or organizations: Not on file    Relationship status: Not on file  . Intimate partner violence:    Fear of current or ex partner: Not on file    Emotionally abused: Not on file    Physically abused: Not on file    Forced sexual activity: Not on file  Other Topics Concern  . Not on file  Social History Narrative   Widowed 04-Jan-2016. Husband died from [place] disease.     ROS:  Pertinent items are noted in HPI.  PHYSICAL EXAMINATION:    BP (!) 110/58 (BP Location: Right Arm, Patient Position: Sitting, Cuff Size: Normal)   Pulse 70   Ht 5\' 5"  (1.651 m)   Wt 130 lb 12.8 oz (59.3 kg)   LMP 11/23/2017 (Exact Date)   BMI 21.77 kg/m     General appearance: alert, cooperative and appears stated age   Pelvic: External genitalia:  no lesions              Urethra:  normal appearing urethra with no masses, tenderness or lesions              Bartholins and Skenes: normal                  Vagina: normal appearing vagina with normal color and discharge, no lesions              Cervix: no lesions                Bimanual Exam:  Uterus:   14 - 15 weeks.              Adnexa: no mass, fullness, tenderness           Chaperone was present for exam.  ASSESSMENT  Vaginitis.  Uterine fibroid.  Abnormal urine.  PLAN  Urine micro and culture.  Affirm.  Diflucan 150 mg po x 1.  May repeat at the end of abx course.  Mycolog II cream to vulva bid x 7 days. Bactrim DS po bid x 3 days.  Hydrate well.  Return if not improved in 48 hours.    An After Visit Summary was printed and given to the patient.  ____15__ minutes face to face time of which over 50% was spent in counseling.

## 2018-01-15 NOTE — Telephone Encounter (Signed)
I would have the patient come in now to be worked in or have her come to the office at 8:00 am tomorrow morning to see me.

## 2018-01-15 NOTE — Telephone Encounter (Signed)
Patient called and requested an appointment with Dr. Quincy Simmonds today, if possible, for burning and itching. She scheduled an appointment for 3:30, 01/17/18, to hold a slot.

## 2018-01-15 NOTE — Telephone Encounter (Signed)
Spoke with patient. Declines appointment today. Appointment scheduled for tomorrow at 8 am with Dr.Silva. Patient is agreeable to date and time.   Routing to provider for final review. Patient agreeable to disposition. Will close encounter.

## 2018-01-15 NOTE — Telephone Encounter (Signed)
Left message to call Kaitlyn at [phone]. 

## 2018-01-15 NOTE — Telephone Encounter (Signed)
Patient is returning a call to Kaitlyn. °

## 2018-01-16 ENCOUNTER — Telehealth: Payer: Self-pay

## 2018-01-16 ENCOUNTER — Ambulatory Visit: Payer: BC Managed Care – PPO | Admitting: Obstetrics and Gynecology

## 2018-01-16 LAB — URINALYSIS, MICROSCOPIC ONLY
Bacteria, UA: NONE SEEN
CASTS: NONE SEEN /LPF
RBC, UA: NONE SEEN /hpf (ref 0–2)

## 2018-01-16 LAB — URINE CULTURE: ORGANISM ID, BACTERIA: NO GROWTH

## 2018-01-16 LAB — VAGINITIS/VAGINOSIS, DNA PROBE
Candida Species: POSITIVE — AB
Gardnerella vaginalis: POSITIVE — AB
Trichomonas vaginosis: NEGATIVE

## 2018-01-16 MED ORDER — METRONIDAZOLE 500 MG PO TABS: 500.0000 mg | ORAL_TABLET | Freq: Two times a day (BID) | ORAL | 0 refills | Status: DC

## 2018-01-16 NOTE — Telephone Encounter (Signed)
Spoke with patient. Results given. Patient verbalizes understanding. Rx for Flagyl 500 mg po BID x 7 days #14 0RF sent to pharmacy on file. Avoid alcohol during treatment and 24 hours after completing medication. Don't mix with alcohol if mixed can cause severe nausea, vomiting and abdominal cramping. Advised to take medication with meals. Patient verbalizes understanding. Encounter closed.

## 2018-01-16 NOTE — Telephone Encounter (Signed)
-----   Message from Nunzio Cobbs, MD sent at 01/16/2018  2:58 PM EDT ----- Please inform of Affirm result showing bacterial vaginosis and yeast.  I already treated her yesterday for yeast with Diflucan and Mycolog II. For the BV, she may treat with Flagyl 500 mg po bid for 7 days or Metrogel pv at hs for 5 nights.  Please send Rx to pharmacy of choice. ETOH precautions.   UC is pending.

## 2018-01-17 ENCOUNTER — Ambulatory Visit: Payer: BC Managed Care – PPO | Admitting: Obstetrics and Gynecology

## 2018-01-22 ENCOUNTER — Telehealth: Payer: Self-pay | Admitting: Obstetrics and Gynecology

## 2018-01-22 ENCOUNTER — Ambulatory Visit: Payer: BC Managed Care – PPO | Admitting: Obstetrics and Gynecology

## 2018-01-22 ENCOUNTER — Other Ambulatory Visit: Payer: Self-pay

## 2018-01-22 ENCOUNTER — Encounter: Payer: Self-pay | Admitting: Obstetrics and Gynecology

## 2018-01-22 VITALS — BP 100/60 | HR 64 | Ht 65.0 in | Wt 127.4 lb

## 2018-01-22 DIAGNOSIS — N760 Acute vaginitis: Secondary | ICD-10-CM

## 2018-01-22 MED ORDER — NYSTATIN-TRIAMCINOLONE 100000-0.1 UNIT/GM-% EX CREA: 1.0000 "application " | TOPICAL_CREAM | Freq: Two times a day (BID) | CUTANEOUS | 0 refills | Status: DC

## 2018-01-22 NOTE — Patient Instructions (Signed)

## 2018-01-22 NOTE — Progress Notes (Signed)
GYNECOLOGY  VISIT   HPI: 47 y.o.   Widowed  [place]  female   [id number] with Patient's last menstrual period was 01/21/2018 (exact date).   here for vaginal itching/irritation.  Still has itching and burning inside and outside.  Symptoms are more external. Some discharge that is white and can be bloody sometimes.   Just treated with Mycolog II, Diflucan, and Flagyl for yeast and bacterial vaginosis.  UC was negative.   Wanting panty liners recently but not usually.   Her soap is made from olive oil and has been using this long term.   Normal recent glucose.  Traveling tomorrow to [place].  GYNECOLOGIC HISTORY: Patient's last menstrual period was 01/21/2018 (exact date). Contraception: None Menopausal hormone therapy:  none Last mammogram: 10/20/17 BIRADS 1 negative/density c. Last pap smear:   11-17-17 Neg:Neg HR HPV         OB History    Gravida  5   Para  5   Term  5   Preterm      AB      Living  5     SAB      TAB      Ectopic      Multiple  1   Live Births                 Patient Active Problem List   Diagnosis Date Noted  . Uterine leiomyoma 11/30/2017  . Symptomatic anemia 08/02/2016    Past Medical History:  Diagnosis Date  . Anemia   . Fibroid     Past Surgical History:  Procedure Laterality Date  . TONSILLECTOMY      Current Outpatient Medications  Medication Sig Dispense Refill  . metroNIDAZOLE (FLAGYL) 500 MG tablet Take 1 tablet (500 mg total) by mouth 2 (two) times daily. 14 tablet 0  . Multiple Vitamin (MULTIVITAMIN) tablet Take 1 tablet by mouth daily.    Marland Kitchen nystatin-triamcinolone (MYCOLOG II) cream Apply 1 application topically 2 (two) times daily. Apply to affected area BID for up to 7 days. 60 g 0  . sulfamethoxazole-trimethoprim (BACTRIM DS) 800-160 MG tablet Take 1 tablet by mouth 2 (two) times daily. One PO BID x 3 days 6 tablet 0  . UNABLE TO FIND Med Name: sleeping medication from another country     No current  facility-administered medications for this visit.      ALLERGIES: Patient has no known allergies.  Family History  Problem Relation Age of Onset  . Hypertension Mother   . Cancer Father   . Diabetes Father   . Diabetes Brother     Social History   Socioeconomic History  . Marital status: Widowed    Spouse name: Not on file  . Number of children: Not on file  . Years of education: Not on file  . Highest education level: Not on file  Occupational History  . Occupation: Scientist, water quality    Comment: [employer]  . Financial resource strain: Not on file  . Food insecurity:    Worry: Not on file    Inability: Not on file  . Transportation needs:    Medical: Not on file    Non-medical: Not on file  Tobacco Use  . Smoking status: Former Smoker    Packs/day: 0.25    Years: 8.00    Pack years: 2.00    Types: Cigarettes    Last attempt to quit: 10/10/2012    Years since quitting: 5.2  .  Smokeless tobacco: Never Used  Substance and Sexual Activity  . Alcohol use: No    Alcohol/week: 0.0 oz  . Drug use: No  . Sexual activity: Never    Birth control/protection: None  Lifestyle  . Physical activity:    Days per week: Not on file    Minutes per session: Not on file  . Stress: Not on file  Relationships  . Social connections:    Talks on phone: Not on file    Gets together: Not on file    Attends religious service: Not on file    Active member of club or organization: Not on file    Attends meetings of clubs or organizations: Not on file    Relationship status: Not on file  . Intimate partner violence:    Fear of current or ex partner: Not on file    Emotionally abused: Not on file    Physically abused: Not on file    Forced sexual activity: Not on file  Other Topics Concern  . Not on file  Social History Narrative   Widowed 01-02-16. Husband died from [place] disease.     ROS:  Pertinent items are noted in HPI.  PHYSICAL EXAMINATION:     BP 100/60 (BP Location: Right Arm, Patient Position: Sitting, Cuff Size: Normal)   Pulse 64   Ht 5\' 5"  (1.651 m)   Wt 127 lb 6.4 oz (57.8 kg)   LMP 01/21/2018 (Exact Date)   BMI 21.20 kg/m     General appearance: alert, cooperative and appears stated age   Pelvic: External genitalia:  no lesions              Urethra:  normal appearing urethra with no masses, tenderness or lesions              Bartholins and Skenes: normal                 Vagina: normal appearing vagina with normal color and discharge, no lesions              Cervix: no lesions.  Menstrual flow noted.                Bimanual Exam:  Uterus:  14 - 15 week size.               Adnexa: no mass, fullness, tenderness          Wet prep - saline and KOH:  No yeast or clue cells noted. No pH done due to bleeding.   Chaperone was present for exam.  ASSESSMENT  Vulvovaginitis.  Recent BV and yeast.  Current menses. Known fibroid uterus.  PLAN  Will treat with Mycolog II bid for one week.  Avoid douching.  Avoid constant panty liners (not currently doing). FU prn.    An After Visit Summary was printed and given to the patient.  __15____ minutes face to face time of which over 50% was spent in counseling.

## 2018-01-22 NOTE — Telephone Encounter (Signed)
Patient was seen recently and asking for a different prescription. Patient will be going out of the country tomorrow and hopes to get this prescription today.  Confirmed pharmacy on file.

## 2018-01-22 NOTE — Telephone Encounter (Signed)
Reviewed with Dr. Quincy Simmonds, call returned to patient. OV scheduled for today at 12pm. Patient aware will be worked into schedule. Patient verbalizes understanding and is agreeable.   Routing to provider for final review. Patient is agreeable to disposition. Will close encounter.

## 2018-01-22 NOTE — Telephone Encounter (Signed)
Spoke with patient. Seen in office on 4/8 for UTI symptoms and vaginitis. Tx for UTI and vaginitis. Completed diflucan and Mycolog cream, has one day of flagyl remaining. Reports external burning and itching has not resolved, requesting additional medication, is leaving out of the country on 4/16.   Advised OV recommended for further evaluation, will review schedule with Dr. Quincy Simmonds and return call.

## 2018-02-07 ENCOUNTER — Ambulatory Visit: Payer: BC Managed Care – PPO | Admitting: Obstetrics and Gynecology

## 2018-02-07 ENCOUNTER — Encounter: Payer: Self-pay | Admitting: Obstetrics and Gynecology

## 2018-02-07 ENCOUNTER — Other Ambulatory Visit: Payer: Self-pay

## 2018-02-07 VITALS — BP 118/62 | HR 76 | Temp 98.0°F | Resp 16 | Ht 65.0 in | Wt 130.0 lb

## 2018-02-07 DIAGNOSIS — R309 Painful micturition, unspecified: Secondary | ICD-10-CM | POA: Diagnosis not present

## 2018-02-07 LAB — POCT URINALYSIS DIPSTICK
BILIRUBIN UA: NEGATIVE
GLUCOSE UA: NEGATIVE
Ketones, UA: NEGATIVE
Nitrite, UA: NEGATIVE
Urobilinogen, UA: 0.2 E.U./dL
pH, UA: 5 (ref 5.0–8.0)

## 2018-02-07 MED ORDER — PHENAZOPYRIDINE HCL 200 MG PO TABS: 200.0000 mg | ORAL_TABLET | Freq: Three times a day (TID) | ORAL | 0 refills | Status: DC | PRN

## 2018-02-07 MED ORDER — SULFAMETHOXAZOLE-TRIMETHOPRIM 800-160 MG PO TABS: 1.0000 | ORAL_TABLET | Freq: Two times a day (BID) | ORAL | 0 refills | Status: DC

## 2018-02-07 NOTE — Patient Instructions (Signed)
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
## 2018-02-08 LAB — URINALYSIS, MICROSCOPIC ONLY
CASTS: NONE SEEN /LPF
RBC, UA: >30 /hpf — AB (ref 0–2)

## 2018-02-09 ENCOUNTER — Telehealth: Payer: Self-pay

## 2018-02-09 LAB — URINE CULTURE

## 2018-02-09 NOTE — Telephone Encounter (Signed)
Spoke with patient and notified of negative urine culture. Advised she can stop Bactrim DS. Patient states she feels much better after taking for 3 days. Advised will need follow up PUS end of May/beginning of June. Will call to schedule.

## 2018-02-09 NOTE — Telephone Encounter (Signed)
-----   Message from Nunzio Cobbs, MD sent at 02/09/2018  2:47 PM EDT ----- Please report results of culture which is negative.  She had mucous in the vaginal which caused the red and white blood cells to appear on the micro exam.  The urine culture is showing lactobacillus which is not considered an organism that causes bladder infection.  This is not treated unless it shows up repeatedly on urine cultures.  She can stop the Bactrim DS.   Her uterine fibroid may be contributing to her urinary incontinence, urgency and pelvic discomfort.   I received reports from her prior office and her fibroid measured 3.9 cm in 2017.  We were planning to do a pelvic US recheck at the end of May/beginning of June to check for a change in size.  An order is already in [facility].

## 2018-02-14 NOTE — Telephone Encounter (Signed)
OK for [place] end of June.  I closed the encounter.

## 2018-02-14 NOTE — Telephone Encounter (Signed)
Spoke with patient and scheduled follow up PUS. She states she does not want to have this until 6-end-19. Advised Dr.Silva had recommended 5-end-19 or the first of June. Patient states she cannot schedule until the end of June. Scheduled PUS 04-05-18 9:30am with consult to follow at 10:00 with Dr.Silva. Routed to provider.

## 2018-02-27 ENCOUNTER — Ambulatory Visit: Payer: BC Managed Care – PPO | Admitting: Urgent Care

## 2018-02-28 DIAGNOSIS — M25541 Pain in joints of right hand: Secondary | ICD-10-CM | POA: Insufficient documentation

## 2018-02-28 DIAGNOSIS — M25542 Pain in joints of left hand: Secondary | ICD-10-CM | POA: Insufficient documentation

## 2018-03-09 ENCOUNTER — Ambulatory Visit: Payer: BC Managed Care – PPO | Admitting: Obstetrics and Gynecology

## 2018-03-09 ENCOUNTER — Telehealth: Payer: Self-pay | Admitting: Obstetrics and Gynecology

## 2018-03-09 ENCOUNTER — Other Ambulatory Visit: Payer: Self-pay

## 2018-03-09 ENCOUNTER — Encounter: Payer: Self-pay | Admitting: Obstetrics and Gynecology

## 2018-03-09 VITALS — BP 118/70 | HR 68 | Temp 97.8°F | Resp 16 | Ht 65.0 in | Wt 124.0 lb

## 2018-03-09 DIAGNOSIS — F439 Reaction to severe stress, unspecified: Secondary | ICD-10-CM

## 2018-03-09 NOTE — Telephone Encounter (Signed)
Spoke with patient, requesting OV for breast lumps. Reports hard lump at top of right breast and left breast, noticed this morning.   Denies skin changes, nipple d/c or pain. LMP 02/22/18. Last MMG 10/20/17 - normal.   OV scheduled for today at 4pm with Dr. Quincy Simmonds.   Routing to provider for final review. Patient is agreeable to disposition. Will close encounter.

## 2018-03-09 NOTE — Telephone Encounter (Signed)
Patient found a lump on both breasts.

## 2018-03-09 NOTE — Telephone Encounter (Signed)
Left message to call Valerie Richards at [phone].  

## 2018-03-09 NOTE — Progress Notes (Signed)
GYNECOLOGY  VISIT   HPI: 47 y.o.   Widowed  [place]  female   [phone] with Patient's last menstrual period was 02/21/2018.   here for lump right and left breast     2 - 3 weeks ago started feeling numbness in hands and feet.  Has muscle weakness and joint pain.  Saw her PCP and had blood work which was normal.  Told to take a multivitamin.  When she wakes up she feels like she cannot breathe.  Some palpitations.  One coffee per day.   Fasting during the day right now.   She is feeling a lump in the left chest wall and under the right axillary area starting today.   No pain.  Worried something bad is happening with her health.  Cousin died at age 43 from pancreatic cancer.  Sounds like it may have been metastatic.  Her father has been ill and is living [place]. Daughter marrying in 2 weeks and moving out of the house.   GYNECOLOGIC HISTORY: Patient's last menstrual period was 02/21/2018. Contraception:  Not currently sexually active per patient Menopausal hormone therapy:  none Last mammogram:  10/20/17 BIRADS 1 negative/density c Last pap smear:    11-17-17 Neg:Neg HR HPV        OB History    Gravida  5   Para  5   Term  5   Preterm      AB      Living  5     SAB      TAB      Ectopic      Multiple  1   Live Births                 Patient Active Problem List   Diagnosis Date Noted  . Uterine leiomyoma 11/30/2017  . Symptomatic anemia 08/02/2016    Past Medical History:  Diagnosis Date  . Anemia   . Fibroid     Past Surgical History:  Procedure Laterality Date  . EYE SURGERY Bilateral   . TONSILLECTOMY      Current Outpatient Medications  Medication Sig Dispense Refill  . Multiple Vitamin (MULTIVITAMIN) tablet Take 1 tablet by mouth daily.    Marland Kitchen UNABLE TO FIND Med Name: sleeping medication from another country     No current facility-administered medications for this visit.      ALLERGIES: Patient has no known allergies.  Family  History  Problem Relation Age of Onset  . Hypertension Mother   . Cancer Father   . Diabetes Father   . Diabetes Brother     Social History   Socioeconomic History  . Marital status: Widowed    Spouse name: Not on file  . Number of children: Not on file  . Years of education: Not on file  . Highest education level: Not on file  Occupational History  . Occupation: Scientist, water quality    Comment: [place]  . Financial resource strain: Not on file  . Food insecurity:    Worry: Not on file    Inability: Not on file  . Transportation needs:    Medical: Not on file    Non-medical: Not on file  Tobacco Use  . Smoking status: Former Smoker    Packs/day: 0.25    Years: 8.00    Pack years: 2.00    Types: Cigarettes    Last attempt to quit: 10/10/2012    Years since quitting: 5.4  .  Smokeless tobacco: Never Used  Substance and Sexual Activity  . Alcohol use: No    Alcohol/week: 0.0 oz  . Drug use: No  . Sexual activity: Not Currently    Birth control/protection: None  Lifestyle  . Physical activity:    Days per week: Not on file    Minutes per session: Not on file  . Stress: Not on file  Relationships  . Social connections:    Talks on phone: Not on file    Gets together: Not on file    Attends religious service: Not on file    Active member of club or organization: Not on file    Attends meetings of clubs or organizations: Not on file    Relationship status: Not on file  . Intimate partner violence:    Fear of current or ex partner: Not on file    Emotionally abused: Not on file    Physically abused: Not on file    Forced sexual activity: Not on file  Other Topics Concern  . Not on file  Social History Narrative   Widowed 12/20/15. Husband died from [place] disease.     Review of Systems  Constitutional: Positive for chills.       Bilateral breast lumps   HENT: Negative.   Eyes: Negative.   Respiratory: Negative.   Cardiovascular:  Positive for palpitations.  Gastrointestinal: Negative.   Endocrine: Negative.   Genitourinary: Negative.   Musculoskeletal:       Muscle weakness Muscle or joint pain  Skin: Negative.   Allergic/Immunologic: Negative.   Neurological: Negative.   Hematological: Negative.   Psychiatric/Behavioral: Negative.     PHYSICAL EXAMINATION:    BP 118/70 (BP Location: Right Arm, Patient Position: Sitting, Cuff Size: Normal)   Pulse 68   Temp 97.8 F (36.6 C)   Resp 16   Ht 5\' 5"  (1.651 m)   Wt 124 lb (56.2 kg)   LMP 02/21/2018   BMI 20.63 kg/m     General appearance: alert, cooperative and appears stated age   Lungs: clear to auscultation bilaterally Breasts: normal appearance, no masses or tenderness, No nipple retraction or dimpling, No nipple discharge or bleeding, No axillary or supraclavicular adenopathy.  Ribs are palpable.  Heart: regular rate and rhythm  Chaperone was present for exam.  ASSESSMENT  Situational stress.  Anxiety.  Possible dehydration.   PLAN  We talked about stress and worry.  I recommended she see a counselor and gave her a brochure for [employer] counseling group.  She is going to take 2 months off from work this summer and will consider counseling.  She will increase her fluid intake.  Follow up with PCP regarding her musculoskeletal symptoms persist.  May need to see cardiology if palpitations persist.  FU prn.    An After Visit Summary was printed and given to the patient.  __25____ minutes face to face time of which over 50% was spent in counseling.

## 2018-04-02 ENCOUNTER — Telehealth: Payer: Self-pay | Admitting: Obstetrics and Gynecology

## 2018-04-02 NOTE — Telephone Encounter (Signed)
Please place patient in imaging hold and then close the encounter.

## 2018-04-02 NOTE — Telephone Encounter (Signed)
Patient cancelled ultrasound for 04/05/18 due to being out of town and will call back when she is ready to reschedule. She is unsure when she will be back.

## 2018-04-02 NOTE — Telephone Encounter (Signed)
Forwarded to Triage.

## 2018-04-02 NOTE — Telephone Encounter (Signed)
Spoke with patient and she states she hadn't planned to be gone this long. She will call when she is back in town and reschedule PUS. Routed to provider.

## 2018-04-03 NOTE — Telephone Encounter (Signed)
Placed in imaging hold.Encounter closed.

## 2018-04-05 ENCOUNTER — Other Ambulatory Visit: Payer: BC Managed Care – PPO

## 2018-04-05 ENCOUNTER — Other Ambulatory Visit: Payer: BC Managed Care – PPO | Admitting: Obstetrics and Gynecology

## 2018-05-03 ENCOUNTER — Telehealth: Payer: Self-pay | Admitting: Obstetrics and Gynecology

## 2018-05-03 NOTE — Telephone Encounter (Signed)
Please contact patient to schedule her pelvic ultrasound follow up of a large fibroid.  She cancelled her appointment for earlier this spring.   She came up in my reminder box in Epic.

## 2018-05-04 NOTE — Telephone Encounter (Signed)
Left message to call Latrece Nitta at [phone].  

## 2018-05-09 NOTE — Telephone Encounter (Signed)
Left detailed message, ok per dpr. Advised as seen below per Dr. Quincy Simmonds. Please return call to office at [phone] to schedule.   No alternative name or contacts on dpr.

## 2018-05-15 NOTE — Telephone Encounter (Signed)
Dr. Quincy Simmonds, I have attempted to contact patient x2, no alternative number on file, no return call, please advise.

## 2018-05-15 NOTE — Telephone Encounter (Signed)
Please send letter recommending follow up pelvic ultrasound.

## 2018-05-17 NOTE — Telephone Encounter (Signed)
Letter pended and to Dr. Silva for review.  

## 2018-05-17 NOTE — Telephone Encounter (Signed)
Letter signed and placed in outgoing mail.   Encounter closed.

## 2018-06-20 ENCOUNTER — Telehealth: Payer: Self-pay | Admitting: Obstetrics and Gynecology

## 2018-06-20 DIAGNOSIS — D259 Leiomyoma of uterus, unspecified: Secondary | ICD-10-CM

## 2018-06-20 NOTE — Telephone Encounter (Signed)
Patient called stating that Dr. Quincy Simmonds wanted to check her fibroid and fiber. Patient started her menes at the end of August and is still currently bleeding. Patient stated that it is light, but it is still going. Patient stated that she is also experiencing "headaches all the time."

## 2018-06-20 NOTE — Telephone Encounter (Signed)
Spoke with patient. She received the letter from our office and is calling to schedule follow up for fibroids.  No acute problems at this time. Headaches and vaginal bleeding is typical for her, no increase or heavy bleeding. Does not take any OTC medications for headache.  She requests appointment, however, would like to have ultrasound and consult with Dr. Quincy Simmonds and plan for one appointment only. Needs an appointment for late  In the day. First available that will meet her needs is 07/05/18 and patient declines any other options provided to her. Advised if headache increases to seek care urgently or see PCP. Pt verbalizes understanding and will call back with any questions or concerns.  Ultrasound ordered for precert.  Will send message for Dr. Quincy Simmonds to review.

## 2018-06-21 NOTE — Telephone Encounter (Signed)
Thank you for the update.  Please come if for sooner appointment if the bleeding becomes heavy.

## 2018-06-21 NOTE — Telephone Encounter (Signed)
Patient returning Rosa's call for benefits. [phone]

## 2018-06-21 NOTE — Telephone Encounter (Signed)
Call placed to convey benefits for ultrasound. °

## 2018-06-25 NOTE — Telephone Encounter (Signed)
Spoke with patient regarding benefit for scheduled ultrasound appointment. Patient understood and agreeable. Patient scheduled 07/05/18 with Dr Quincy Simmonds. Patient aware of appointment date, arrival time and cancellation policy. No further questions. Ok to close

## 2018-07-05 ENCOUNTER — Ambulatory Visit (INDEPENDENT_AMBULATORY_CARE_PROVIDER_SITE_OTHER): Payer: BC Managed Care – PPO | Admitting: Obstetrics and Gynecology

## 2018-07-05 ENCOUNTER — Ambulatory Visit (INDEPENDENT_AMBULATORY_CARE_PROVIDER_SITE_OTHER): Payer: BC Managed Care – PPO

## 2018-07-05 ENCOUNTER — Other Ambulatory Visit: Payer: Self-pay | Admitting: Obstetrics and Gynecology

## 2018-07-05 ENCOUNTER — Encounter: Payer: Self-pay | Admitting: Obstetrics and Gynecology

## 2018-07-05 VITALS — BP 110/62 | HR 64 | Resp 14 | Ht 65.0 in | Wt 127.6 lb

## 2018-07-05 DIAGNOSIS — N760 Acute vaginitis: Secondary | ICD-10-CM

## 2018-07-05 DIAGNOSIS — R51 Headache: Secondary | ICD-10-CM

## 2018-07-05 DIAGNOSIS — D259 Leiomyoma of uterus, unspecified: Secondary | ICD-10-CM

## 2018-07-05 DIAGNOSIS — D219 Benign neoplasm of connective and other soft tissue, unspecified: Secondary | ICD-10-CM

## 2018-07-05 DIAGNOSIS — Z119 Encounter for screening for infectious and parasitic diseases, unspecified: Secondary | ICD-10-CM | POA: Diagnosis not present

## 2018-07-05 DIAGNOSIS — R519 Headache, unspecified: Secondary | ICD-10-CM

## 2018-07-05 MED ORDER — NYSTATIN-TRIAMCINOLONE 100000-0.1 UNIT/GM-% EX CREA: 1.0000 "application " | TOPICAL_CREAM | Freq: Two times a day (BID) | CUTANEOUS | 0 refills | Status: DC

## 2018-07-05 NOTE — Progress Notes (Signed)
Patient scheduled while in office for lab appointment on 07/10/18 at 3:40pm.

## 2018-07-05 NOTE — Progress Notes (Signed)
GYNECOLOGY  VISIT   HPI: 47 y.o.   Widowed  [place]   female   [phone] with Patient's last menstrual period was 06/17/2018.   here for  Transabdominal pelvic ultrasound for recheck of her fibroid.   Menses are now regular. Having hot flashes.   Noticing vaginal odor.  Some vaginal discharge.  Little bit of itching/burning.  Did not use OTC meds.  Did have yeast and BV in April 2019.   Friend has hepatitis and patient is concerned about this.   Father ill and she cannot travel to see him.  She has periods of excessive crying.  Does have family to talk to.   Having morning HA that is waking her up.   GYNECOLOGIC HISTORY: Patient's last menstrual period was 06/17/2018. Contraception:  Not currently sexually active  Menopausal hormone therapy:  None  Last mammogram:  10/20/17 Birads 1 neg density C  Last pap smear:   11/17/17 Neg HR HPV neg        OB History    Gravida  5   Para  5   Term  5   Preterm      AB      Living  5     SAB      TAB      Ectopic      Multiple  1   Live Births                 Patient Active Problem List   Diagnosis Date Noted  . Uterine leiomyoma 11/30/2017  . Symptomatic anemia 08/02/2016    Past Medical History:  Diagnosis Date  . Anemia   . Fibroid     Past Surgical History:  Procedure Laterality Date  . EYE SURGERY Bilateral   . TONSILLECTOMY      Current Outpatient Medications  Medication Sig Dispense Refill  . Multiple Vitamin (MULTIVITAMIN) tablet Take 1 tablet by mouth daily.    Marland Kitchen UNABLE TO FIND Med Name: sleeping medication from another country     No current facility-administered medications for this visit.      ALLERGIES: Patient has no known allergies.  Family History  Problem Relation Age of Onset  . Hypertension Mother   . Cancer Father   . Diabetes Father   . Diabetes Brother     Social History   Socioeconomic History  . Marital status: Widowed    Spouse name: Not on file  . Number  of children: Not on file  . Years of education: Not on file  . Highest education level: Not on file  Occupational History  . Occupation: Scientist, water quality    Comment: [place]  . Financial resource strain: Not on file  . Food insecurity:    Worry: Not on file    Inability: Not on file  . Transportation needs:    Medical: Not on file    Non-medical: Not on file  Tobacco Use  . Smoking status: Former Smoker    Packs/day: 0.25    Years: 8.00    Pack years: 2.00    Types: Cigarettes    Last attempt to quit: 10/10/2012    Years since quitting: 5.7  . Smokeless tobacco: Never Used  Substance and Sexual Activity  . Alcohol use: No    Alcohol/week: 0.0 standard drinks  . Drug use: No  . Sexual activity: Not Currently    Birth control/protection: None  Lifestyle  . Physical activity:  Days per week: Not on file    Minutes per session: Not on file  . Stress: Not on file  Relationships  . Social connections:    Talks on phone: Not on file    Gets together: Not on file    Attends religious service: Not on file    Active member of club or organization: Not on file    Attends meetings of clubs or organizations: Not on file    Relationship status: Not on file  . Intimate partner violence:    Fear of current or ex partner: Not on file    Emotionally abused: Not on file    Physically abused: Not on file    Forced sexual activity: Not on file  Other Topics Concern  . Not on file  Social History Narrative   Widowed 2015-12-09. Husband died from [place] disease.     Review of Systems  Constitutional: Positive for chills.  HENT:       Headace  Cardiovascular:       Heart Murmurs   Gastrointestinal: Positive for constipation.  Endocrine: Positive for cold intolerance and heat intolerance.  Genitourinary: Positive for dysuria.       Loss of urine when sneezing  Night urination   Musculoskeletal:       Joint pain  Swelling     PHYSICAL EXAMINATION:     BP 110/62   Pulse 64   Resp 14   Ht 5\' 5"  (1.651 m)   Wt 127 lb 9.6 oz (57.9 kg)   LMP 06/17/2018   BMI 21.23 kg/m     General appearance: alert, cooperative and appears stated age   Pelvic US -  Fibroid 6.8 x 5.8 x 5.0 cm (smaller). Ovaries normal.   Chaperone was present for exam.  ASSESSMENT  Vaginitis.  Uterine fibroid. Smaller.  Depression. Infectious disease screening. HA.   PLAN  Vaginitis screening.  Mycolog II.  No further [place] needed unless has change in clinical picture.  [facility].  Hep A, B, and C screening.  She will return for this.  I recommend she follow up with her PCP for evaluation of her HA.   An After Visit Summary was printed and given to the patient.  __25____ minutes face to face time of which over 50% was spent in counseling.

## 2018-07-07 LAB — VAGINITIS/VAGINOSIS, DNA PROBE
CANDIDA SPECIES: NEGATIVE
GARDNERELLA VAGINALIS: POSITIVE — AB
Trichomonas vaginosis: NEGATIVE

## 2018-07-09 ENCOUNTER — Other Ambulatory Visit: Payer: Self-pay | Admitting: Obstetrics and Gynecology

## 2018-07-09 ENCOUNTER — Telehealth: Payer: Self-pay | Admitting: Obstetrics and Gynecology

## 2018-07-09 ENCOUNTER — Telehealth: Payer: Self-pay | Admitting: *Deleted

## 2018-07-09 DIAGNOSIS — Z862 Personal history of diseases of the blood and blood-forming organs and certain disorders involving the immune mechanism: Secondary | ICD-10-CM

## 2018-07-09 MED ORDER — METRONIDAZOLE 500 MG PO TABS: 500.0000 mg | ORAL_TABLET | Freq: Two times a day (BID) | ORAL | 0 refills | Status: AC

## 2018-07-09 NOTE — Telephone Encounter (Signed)
Call to patient. Results reviewed with patient as seen below from Dr. Quincy Simmonds and she verbalized understanding. Patient requesting to treat with flagyl. Instructions on use and ETOH precautions reviewed with patient and she verbalized understanding. Pharmacy on file confirmed. Prescription for flagyl 500mg , #14, 0RF sent to pharmacy on file.   Patient also states she has a lab appointment tomorrow and asking if she can be checked for anemia. Patient states she has a history of anemia and wants this checked to make sure she is not currently anemic. RN advised would review with Dr. Quincy Simmonds. Patient agreeable. Okay to add CBC to blood work?  Routing to provider to review and advise.

## 2018-07-09 NOTE — Telephone Encounter (Signed)
Call to patient. Advised patient Dr. Quincy Simmonds added on a CBC to blood work tomorrow-- see telephone encounter dated 07-09-18. Patient denies symptoms associated with anemia currently.   Encounter closed.

## 2018-07-09 NOTE — Telephone Encounter (Signed)
I added the CBC to her blood work.  You may close the encounter.

## 2018-07-09 NOTE — Telephone Encounter (Signed)
-----   Message from Nunzio Cobbs, MD sent at 07/08/2018  1:32 PM EDT ----- Please inform of Affirm result showing bacterial vaginosis. She may treat with Flagyl 500 mg po bid for 7 days or Metrogel pv at hs for 5 nights.  Please send Rx to pharmacy of choice. ETOH precautions.

## 2018-07-09 NOTE — Telephone Encounter (Signed)
Encounter closed per Dr. Quincy Simmonds.

## 2018-07-09 NOTE — Telephone Encounter (Signed)
Patient returning nurse call

## 2018-07-10 ENCOUNTER — Other Ambulatory Visit (INDEPENDENT_AMBULATORY_CARE_PROVIDER_SITE_OTHER): Payer: BC Managed Care – PPO

## 2018-07-10 DIAGNOSIS — Z862 Personal history of diseases of the blood and blood-forming organs and certain disorders involving the immune mechanism: Secondary | ICD-10-CM

## 2018-07-10 DIAGNOSIS — Z119 Encounter for screening for infectious and parasitic diseases, unspecified: Secondary | ICD-10-CM

## 2018-07-11 LAB — CBC
HEMATOCRIT: 31.1 % — AB (ref 34.0–46.6)
Hemoglobin: 10.6 g/dL — ABNORMAL LOW (ref 11.1–15.9)
MCH: 30.5 pg (ref 26.6–33.0)
MCHC: 34.1 g/dL (ref 31.5–35.7)
MCV: 89 fL (ref 79–97)
PLATELETS: 231 10*3/uL (ref 150–450)
RBC: 3.48 x10E6/uL — ABNORMAL LOW (ref 3.77–5.28)
RDW: 12.1 % — AB (ref 12.3–15.4)
WBC: 4.6 10*3/uL (ref 3.4–10.8)

## 2018-07-11 LAB — HEPATITIS B SURFACE ANTIGEN: HEP B S AG: NEGATIVE

## 2018-07-11 LAB — HEPATITIS A ANTIBODY, IGM: HEP A IGM: NEGATIVE

## 2018-07-11 LAB — HEPATITIS C ANTIBODY

## 2018-07-14 LAB — SPECIMEN STATUS REPORT

## 2018-07-14 LAB — IRON: Iron: 62 ug/dL (ref 27–159)

## 2018-07-14 LAB — FERRITIN: Ferritin: 9 ng/mL — ABNORMAL LOW (ref 15–150)

## 2018-07-15 ENCOUNTER — Encounter: Payer: Self-pay | Admitting: Obstetrics and Gynecology

## 2018-07-15 NOTE — Addendum Note (Signed)
Addended by: Yisroel Ramming, Dietrich Pates E on: 07/15/2018 12:36 PM   Modules accepted: Orders

## 2018-07-16 ENCOUNTER — Telehealth: Payer: Self-pay | Admitting: *Deleted

## 2018-07-16 NOTE — Telephone Encounter (Signed)
Call to patient. All results reviewed with patient and she verbalized understanding. Patient aware to start OTC iron. Patient declines to schedule 6 week lab recheck at this time- states she will return call to schedule. Patient also states she completed the metrogel, but is still having burning, itchiness and white discharge. Denies fever. OV offered to patient and she declined appointment today, but scheduled for Friday 07-20-18 at 1530. Patient agreeable to date and time of appointment. RN advised would review symptoms with Dr. Quincy Simmonds and return call to patient with recommendations. Pharmacy on file confirmed.   Routing to provider for review.

## 2018-07-16 NOTE — Telephone Encounter (Signed)
-----   Message from Nunzio Cobbs, MD sent at 07/15/2018 12:36 PM EDT ----- Please contact patient with her iron studies.  Her ferritin level is low, which indicates her iron stores are low. I recommend she start iron sulfate 325 mg by mouth twice daily with orange juice for her anemia.  Her hemoglobin is 10.6.  I recommend she have a lab recheck in 6 weeks.  Please schedule this. I will place future orders.

## 2018-07-16 NOTE — Telephone Encounter (Signed)
-----   Message from Nunzio Cobbs, MD sent at 07/13/2018  9:39 AM EDT ----- Please report labs to patient.   She is anemia, and her hemoglobin in 10.6.  I have asked the lab to add an iron and ferritin.  I anticipate she will need to start iron therapy, but I will give her final direction after these results are back.   Testing is negative for hepatitis A, B, and C.

## 2018-07-16 NOTE — Telephone Encounter (Signed)
[place] for appointment this week for vaginitis recheck.  I will discuss future lab visit then. Encounter closed.

## 2018-07-16 NOTE — Telephone Encounter (Signed)
Call to patient. Message given to patient as seen below from Dr. Quincy Simmonds. Patient agreeable.   Encounter closed.

## 2018-07-18 ENCOUNTER — Telehealth: Payer: Self-pay | Admitting: Obstetrics and Gynecology

## 2018-07-18 NOTE — Telephone Encounter (Signed)
Patient canceled her vaginitis appointment 07/20/18 via the automated reminder call. I left her a message to call and reschedule.

## 2018-07-18 NOTE — Telephone Encounter (Signed)
Call reviewed.  Encounter closed.

## 2018-07-20 ENCOUNTER — Ambulatory Visit: Payer: BC Managed Care – PPO | Admitting: Obstetrics and Gynecology

## 2018-09-03 DIAGNOSIS — J3489 Other specified disorders of nose and nasal sinuses: Secondary | ICD-10-CM

## 2018-09-03 DIAGNOSIS — R519 Headache, unspecified: Secondary | ICD-10-CM | POA: Insufficient documentation

## 2018-09-03 DIAGNOSIS — J0121 Acute recurrent ethmoidal sinusitis: Secondary | ICD-10-CM | POA: Insufficient documentation

## 2018-09-03 DIAGNOSIS — R0981 Nasal congestion: Secondary | ICD-10-CM | POA: Insufficient documentation

## 2018-09-03 HISTORY — DX: Other specified disorders of nose and nasal sinuses: J34.89

## 2018-09-03 HISTORY — DX: Acute recurrent ethmoidal sinusitis: J01.21

## 2018-09-28 ENCOUNTER — Other Ambulatory Visit: Payer: Self-pay | Admitting: Obstetrics and Gynecology

## 2018-09-28 DIAGNOSIS — Z1231 Encounter for screening mammogram for malignant neoplasm of breast: Secondary | ICD-10-CM

## 2018-10-01 ENCOUNTER — Telehealth: Payer: Self-pay | Admitting: Obstetrics and Gynecology

## 2018-10-01 NOTE — Telephone Encounter (Signed)
Patient called stating that she has been bleeding for "about 20 days." Patient stated that it is not heavy, but she notices it when she uses the bathroom. Patient stated that her neck, knees, and bones are also hurting.

## 2018-10-01 NOTE — Telephone Encounter (Signed)
Spoke with patient. Patient reports menses has not stopped since 12/3. Describes flow as light, wearing panty liner notices mostly when wiping. Reports "achy bones all over" and weakness. Denies dizziness, SOB lightheadedness, pelvic pain, fever/chills.   Patient has hx of anemia, last hgb 10.6 on 10/7. Patient was to start iron bid and return in 6wks for repeat labs. Patient cancelled f/u.   Recommended OV with Dr. Quincy Simmonds. OV scheduled for 12/26 at 11am. Recommended patient increase iron to bid, continue to monitor bleeding. If bleeding becomes heavy, saturating pad q1-2 hrs or new symptoms develop, seek care at local ER/[facility]. Advised I will review with Dr. Quincy Simmonds and return call with any additional recommendations.   Routing to Dr. Quincy Simmonds.

## 2018-10-01 NOTE — Telephone Encounter (Signed)
Call reviewed with Dr. Quincy Simmonds, no additonal recommendations. Keep OV as scheduled, ER precautions.   Call returned to patient, advised as seen above. Patient verbalizes understanding and is agreeable.   Encounter closed.

## 2018-10-04 ENCOUNTER — Ambulatory Visit: Payer: Self-pay | Admitting: Obstetrics and Gynecology

## 2018-10-04 NOTE — Progress Notes (Signed)
GYNECOLOGY  VISIT   HPI: 47 y.o.   Widowed  [place]  female   [id number] with Patient's last menstrual period was 09/11/2018 (exact date).   here for irregular vaginal bleeding since 09-11-18.  This is affecting her ability to pray as she cannot pray when she is on her menstruation.   Bleeding every day.   Can have some pain with BMs, feeling heaviness.   Periods were regular prior to this.  She has had skipped menses at the end of last year.   She has not had an episode of prolonged bleeding for a long time.   Really worried that something is wrong.   Noting headache.   Feels cold and tired.  Has known anemia and did not have her labs rechecked to date. Hgb 10.6 on 07/10/18.   Noticing hot flashes.   Not sexually active.   UPT:Neg  GYNECOLOGIC HISTORY: Patient's last menstrual period was 09/11/2018 (exact date). Contraception: None/abstinence Menopausal hormone therapy:  none Last mammogram:  10/20/17 Birads 1 neg density C  Last pap smear:  11/17/17 Neg HR HPV neg        OB History    Gravida  5   Para  5   Term  5   Preterm      AB      Living  5     SAB      TAB      Ectopic      Multiple  1   Live Births                 Patient Active Problem List   Diagnosis Date Noted  . Uterine leiomyoma 11/30/2017  . Symptomatic anemia 08/02/2016    Past Medical History:  Diagnosis Date  . Anemia    iron deficiency  . Fibroid     Past Surgical History:  Procedure Laterality Date  . EYE SURGERY Bilateral   . TONSILLECTOMY      Current Outpatient Medications  Medication Sig Dispense Refill  . ferrous sulfate 325 (65 FE) MG EC tablet Take 325 mg by mouth every other day.    . Multiple Vitamin (MULTIVITAMIN) tablet Take 1 tablet by mouth daily.    Marland Kitchen UNABLE TO FIND Med Name: sleeping medication from another country     No current facility-administered medications for this visit.      ALLERGIES: Patient has no known allergies.  Family  History  Problem Relation Age of Onset  . Hypertension Mother   . Cancer Father   . Diabetes Father   . Diabetes Brother     Social History   Socioeconomic History  . Marital status: Widowed    Spouse name: Not on file  . Number of children: Not on file  . Years of education: Not on file  . Highest education level: Not on file  Occupational History  . Occupation: Scientist, water quality    Comment: [employer]  . Financial resource strain: Not on file  . Food insecurity:    Worry: Not on file    Inability: Not on file  . Transportation needs:    Medical: Not on file    Non-medical: Not on file  Tobacco Use  . Smoking status: Former Smoker    Packs/day: 0.25    Years: 8.00    Pack years: 2.00    Types: Cigarettes    Last attempt to quit: 10/10/2012    Years since quitting: 5.9  .  Smokeless tobacco: Never Used  Substance and Sexual Activity  . Alcohol use: No    Alcohol/week: 0.0 standard drinks  . Drug use: No  . Sexual activity: Not Currently    Birth control/protection: None  Lifestyle  . Physical activity:    Days per week: Not on file    Minutes per session: Not on file  . Stress: Not on file  Relationships  . Social connections:    Talks on phone: Not on file    Gets together: Not on file    Attends religious service: Not on file    Active member of club or organization: Not on file    Attends meetings of clubs or organizations: Not on file    Relationship status: Not on file  . Intimate partner violence:    Fear of current or ex partner: Not on file    Emotionally abused: Not on file    Physically abused: Not on file    Forced sexual activity: Not on file  Other Topics Concern  . Not on file  Social History Narrative   Widowed 23-Dec-2015. Husband died from [place] disease.     Review of Systems  Constitutional: Positive for chills.  Musculoskeletal:       Joint and muscle aches  Neurological: Positive for headaches.  All other  systems reviewed and are negative.   PHYSICAL EXAMINATION:    BP 100/66 (BP Location: Right Arm, Patient Position: Sitting, Cuff Size: Normal)   Pulse 64   Ht 5\' 5"  (1.651 m)   Wt 123 lb (55.8 kg)   LMP 09/11/2018 (Exact Date)   BMI 20.47 kg/m     General appearance: alert, cooperative and appears stated age   Pelvic: External genitalia:  no lesions              Urethra:  normal appearing urethra with no masses, tenderness or lesions              Bartholins and Skenes: normal                 Vagina: normal appearing vagina with normal color and discharge, no lesions              Cervix: no lesions                Bimanual Exam:  Uterus:  12 week size, nontender.              Adnexa: no mass, fullness, tenderness                Chaperone was present for exam.  ASSESSMENT  Irregular menses.  Probable anovulatory cycle.  Perimenopausal female. Known fibroid.  Anemia.   PLAN  Check CBC, iron and ferritin.  Provera 10 mg x 10 days.  Return for EMB.    An After Visit Summary was printed and given to the patient.  _15_____ minutes face to face time of which over 50% was spent in counseling.

## 2018-10-05 ENCOUNTER — Ambulatory Visit: Payer: BC Managed Care – PPO | Admitting: Obstetrics and Gynecology

## 2018-10-05 ENCOUNTER — Other Ambulatory Visit: Payer: Self-pay

## 2018-10-05 ENCOUNTER — Encounter: Payer: Self-pay | Admitting: Obstetrics and Gynecology

## 2018-10-05 VITALS — BP 100/66 | HR 64 | Ht 65.0 in | Wt 123.0 lb

## 2018-10-05 DIAGNOSIS — D649 Anemia, unspecified: Secondary | ICD-10-CM | POA: Diagnosis not present

## 2018-10-05 DIAGNOSIS — N926 Irregular menstruation, unspecified: Secondary | ICD-10-CM

## 2018-10-05 DIAGNOSIS — Z862 Personal history of diseases of the blood and blood-forming organs and certain disorders involving the immune mechanism: Secondary | ICD-10-CM | POA: Diagnosis not present

## 2018-10-05 LAB — POCT URINE PREGNANCY: Preg Test, Ur: NEGATIVE

## 2018-10-05 MED ORDER — MEDROXYPROGESTERONE ACETATE 10 MG PO TABS: 10.0000 mg | ORAL_TABLET | Freq: Every day | ORAL | 0 refills | Status: DC

## 2018-10-05 NOTE — Addendum Note (Signed)
Addended by: Michele Mcalpine on: 10/05/2018 02:26 PM   Modules accepted: Orders

## 2018-10-05 NOTE — Patient Instructions (Signed)
Endometrial Biopsy    Endometrial biopsy is a procedure in which a tissue sample is taken from inside the uterus. The sample is taken from the endometrium, which is the lining of the uterus. The tissue sample is then checked under a microscope to see if the tissue is normal or abnormal. This procedure helps to determine where you are in your menstrual cycle and how hormone levels are affecting the lining of the uterus. This procedure may also be used to evaluate uterine bleeding or to diagnose endometrial cancer, endometrial tuberculosis, polyps, or other inflammatory conditions.  Tell a health care provider about:   Any allergies you have.   All medicines you are taking, including vitamins, herbs, eye drops, creams, and over-the-counter medicines.   Any problems you or family members have had with anesthetic medicines.   Any blood disorders you have.   Any surgeries you have had.   Any medical conditions you have.   Whether you are pregnant or may be pregnant.  What are the risks?  Generally, this is a safe procedure. However, problems may occur, including:   Bleeding.   Pelvic infection.   Puncture of the wall of the uterus with the biopsy device (rare).  What happens before the procedure?   Keep a record of your menstrual cycles as told by your health care provider. You may need to schedule your procedure for a specific time in your cycle.   You may want to bring a sanitary pad to wear after the procedure.   Ask your health care provider about:  ? Changing or stopping your regular medicines. This is especially important if you are taking diabetes medicines or blood thinners.  ? Taking medicines such as aspirin and ibuprofen. These medicines can thin your blood. Do not take these medicines before your procedure if your health care provider instructs you not to.   Plan to have someone take you home from the hospital or clinic.  What happens during the procedure?   To lower your risk of  infection:  ? Your health care team will wash or sanitize their hands.   You will lie on an exam table with your feet and legs supported as in a pelvic exam.   Your health care provider will insert an instrument (speculum) into your vagina to see your cervix.   Your cervix will be cleansed with an antiseptic solution.   A medicine (local anesthetic) will be used to numb the cervix.   A forceps instrument (tenaculum) will be used to hold your cervix steady for the biopsy.   A thin, rod-like instrument (uterine sound) will be inserted through your cervix to determine the length of your uterus and the location where the biopsy sample will be removed.   A thin, flexible tube (catheter) will be inserted through your cervix and into the uterus. The catheter will be used to collect the biopsy sample from your endometrial tissue.   The catheter and speculum will then be removed, and the tissue sample will be sent to a lab for examination.  What happens after the procedure?   You will rest in a recovery area until you are ready to go home.   You may have mild cramping and a small amount of vaginal bleeding. This is normal.   It is up to you to get the results of your procedure. Ask your health care provider, or the department that is doing the procedure, when your results will be ready.  Summary     Endometrial biopsy is a procedure in which a tissue sample is taken from the endometrium, which is the lining of the uterus.   This procedure may help to diagnose menstrual cycle problems, abnormal bleeding, or other conditions affecting the endometrium.   Before the procedure, keep a record of your menstrual cycles as told by your health care provider.   The tissue sample that is removed will be checked under a microscope to see if it is normal or abnormal.  This information is not intended to replace advice given to you by your health care provider. Make sure you discuss any questions you have with your health care  provider.  Document Released: 01/27/2005 Document Revised: 10/12/2016 Document Reviewed: 10/12/2016  Elsevier Interactive Patient Education  2019 Elsevier Inc.

## 2018-10-05 NOTE — Addendum Note (Signed)
Addended by: Yisroel Ramming, Dietrich Pates E on: 10/05/2018 03:48 PM   Modules accepted: Orders

## 2018-10-06 LAB — CBC
Hematocrit: 38.1 % (ref 34.0–46.6)
Hemoglobin: 12.6 g/dL (ref 11.1–15.9)
MCH: 30.1 pg (ref 26.6–33.0)
MCHC: 33.1 g/dL (ref 31.5–35.7)
MCV: 91 fL (ref 79–97)
Platelets: 288 10*3/uL (ref 150–450)
RBC: 4.18 x10E6/uL (ref 3.77–5.28)
RDW: 12 % — ABNORMAL LOW (ref 12.3–15.4)
WBC: 5.4 10*3/uL (ref 3.4–10.8)

## 2018-10-06 LAB — IRON: Iron: 77 ug/dL (ref 27–159)

## 2018-10-06 LAB — FERRITIN: Ferritin: 69 ng/mL (ref 15–150)

## 2018-10-11 ENCOUNTER — Telehealth: Payer: Self-pay | Admitting: Obstetrics and Gynecology

## 2018-10-11 NOTE — Telephone Encounter (Signed)
Spoke with patient and conveyed benefits for EMB. Patient understands and agreeable. Appointment scheduled for 10/19/18@3 :00pm with Dr. Quincy Simmonds. Patient has some additional questions regarding advil or ibuprofen.

## 2018-10-11 NOTE — Telephone Encounter (Signed)
Call placed to convey benefits for emb. 

## 2018-10-11 NOTE — Telephone Encounter (Signed)
Call transferred from [place].   Patient reports she started Provera 10mg  daily on 10/07/18. Flow has become lighter, describes as light flow.   Advised to take Motrin 800 mg with food and water one hour before procedure. Questions answered.   Routing to provider for final review. Patient is agreeable to disposition. Will close encounter.

## 2018-10-18 NOTE — Progress Notes (Signed)
GYNECOLOGY  VISIT   HPI: 48 y.o.   Widowed  [place]  female   [id number] with Patient's last menstrual period was 09/11/2018 (exact date).   here for endometrial biopsy for prolonged vaginal bleeding.   States she wants to know if she is OK.  Has to take care of her children and she is a widow.   Irregular bleeding since 09/11/18.  Has a known large fibroid.  Took Provera x 10 days.  This did not really stop the bleeding but it did reduce it.  Today is having more bleeding.   Does not want to eliminate her menstruation from occurring normally.  UPT:Neg  GYNECOLOGIC HISTORY: Patient's last menstrual period was 09/11/2018 (exact date). Contraception: Abstinence Menopausal hormone therapy:  None Last mammogram: 10/20/17 Birads 1 neg density C Last pap smear: 11-17-17 Neg:Neg HR HPV         OB History    Gravida  5   Para  5   Term  5   Preterm      AB      Living  5     SAB      TAB      Ectopic      Multiple  1   Live Births                 Patient Active Problem List   Diagnosis Date Noted  . Uterine leiomyoma 11/30/2017  . Symptomatic anemia 08/02/2016    Past Medical History:  Diagnosis Date  . Anemia    iron deficiency  . Fibroid     Past Surgical History:  Procedure Laterality Date  . EYE SURGERY Bilateral   . TONSILLECTOMY      Current Outpatient Medications  Medication Sig Dispense Refill  . ferrous sulfate 325 (65 FE) MG EC tablet Take 325 mg by mouth every other day.    Marland Kitchen UNABLE TO FIND Med Name: sleeping medication from another country    . Multiple Vitamin (MULTIVITAMIN) tablet Take 1 tablet by mouth daily.     No current facility-administered medications for this visit.      ALLERGIES: Patient has no known allergies.  Family History  Problem Relation Age of Onset  . Hypertension Mother   . Cancer Father   . Diabetes Father   . Diabetes Brother     Social History   Socioeconomic History  . Marital status: Widowed     Spouse name: Not on file  . Number of children: Not on file  . Years of education: Not on file  . Highest education level: Not on file  Occupational History  . Occupation: Scientist, water quality    Comment: [employer]  . Financial resource strain: Not on file  . Food insecurity:    Worry: Not on file    Inability: Not on file  . Transportation needs:    Medical: Not on file    Non-medical: Not on file  Tobacco Use  . Smoking status: Former Smoker    Packs/day: 0.25    Years: 8.00    Pack years: 2.00    Types: Cigarettes    Last attempt to quit: 10/10/2012    Years since quitting: 6.0  . Smokeless tobacco: Never Used  Substance and Sexual Activity  . Alcohol use: No    Alcohol/week: 0.0 standard drinks  . Drug use: No  . Sexual activity: Not Currently    Birth control/protection: None  Lifestyle  . Physical  activity:    Days per week: Not on file    Minutes per session: Not on file  . Stress: Not on file  Relationships  . Social connections:    Talks on phone: Not on file    Gets together: Not on file    Attends religious service: Not on file    Active member of club or organization: Not on file    Attends meetings of clubs or organizations: Not on file    Relationship status: Not on file  . Intimate partner violence:    Fear of current or ex partner: Not on file    Emotionally abused: Not on file    Physically abused: Not on file    Forced sexual activity: Not on file  Other Topics Concern  . Not on file  Social History Narrative   Widowed 2015/12/28. Husband died from [place] disease.     Review of Systems  All other systems reviewed and are negative.   PHYSICAL EXAMINATION:    BP 110/60 (BP Location: Right Arm, Patient Position: Sitting, Cuff Size: Normal)   Pulse 76   Ht 5\' 5"  (1.651 m)   Wt 123 lb (55.8 kg)   LMP 09/11/2018 (Exact Date)   BMI 20.47 kg/m     General appearance: alert, cooperative and appears stated age    Pelvic: External genitalia:  no lesions              Urethra:  normal appearing urethra with no masses, tenderness or lesions              Bartholins and Skenes: normal                 Vagina: normal appearing vagina with normal color and discharge, no lesions              Cervix: no lesions                Bimanual Exam:  Uterus:  12 week size and non-tender              Adnexa: no mass, fullness, tenderness              EMB: Consent for procedure. Sterile prep with  Hibiclens Paracervical block with 1% lidocaine 10 cc, lot number   [id number]   , expiration  4/23 Tenaculum to anterior cervical lip. Pipelle passed to   6.5       cm twice.   Tissue to pathology.  Minimal EBL. No complications.   Chaperone was present for exam.  ASSESSMENT  Abnormal prolonged uterine bleeding.  Likely anovulatory.  Uterine fibroid.  PLAN  We discussed Provera and the mechanism of action.  FU EMB.  Instructions and precautions given. May need cyclic Provera.    An After Visit Summary was printed and given to the patient.  __15____ minutes face to face time of which over 50% was spent in counseling.

## 2018-10-19 ENCOUNTER — Encounter: Payer: Self-pay | Admitting: Obstetrics and Gynecology

## 2018-10-19 ENCOUNTER — Other Ambulatory Visit: Payer: Self-pay

## 2018-10-19 ENCOUNTER — Ambulatory Visit: Payer: BC Managed Care – PPO | Admitting: Obstetrics and Gynecology

## 2018-10-19 ENCOUNTER — Telehealth: Payer: Self-pay | Admitting: Obstetrics and Gynecology

## 2018-10-19 VITALS — BP 110/60 | HR 76 | Ht 65.0 in | Wt 123.0 lb

## 2018-10-19 DIAGNOSIS — N926 Irregular menstruation, unspecified: Secondary | ICD-10-CM | POA: Diagnosis not present

## 2018-10-19 DIAGNOSIS — Z01812 Encounter for preprocedural laboratory examination: Secondary | ICD-10-CM | POA: Diagnosis not present

## 2018-10-19 LAB — POCT URINE PREGNANCY: Preg Test, Ur: NEGATIVE

## 2018-10-19 NOTE — Telephone Encounter (Signed)
Call to patient.  Noticing blood on toilet tissue after voiding.  No pain with void.  Endometrial biopsy scheduled for 1500 with Dr. Quincy Simmonds.  Advised to keep appointment as scheduled.  No need to cancel.  Pt agrees.  Encounter closed.

## 2018-10-19 NOTE — Patient Instructions (Signed)
Endometrial Ablation, Care After  This sheet gives you information about how to care for yourself after your procedure. Your health care provider may also give you more specific instructions. If you have problems or questions, contact your health care provider.  What can I expect after the procedure?  After the procedure, it is common to have:   A need to urinate more frequently than usual for the first 24 hours.   Cramps similar to menstrual cramps. These may last for 1-2 days.   Thin, watery vaginal discharge that is light pink or brown in color. This may last a few weeks. Discharge will be heavy for the first few days after your procedure. You may need to wear a sanitary pad.   Nausea.   Vaginal bleeding for 4-6 weeks after the procedure, as tissue healing occurs.  Follow these instructions at home:  Activity   Do not drive for 24 hours if you were given amedicine to help you relax (sedative) during your procedure.   Do not have sex or put anything into your vagina until your health care provider approves.   Do not lift anything that is heavier than 10 lb (4.5 kg), or the limit that you are told, until your health care provider says that it is safe.   Return to your normal activities as told by your health care provider. Ask your health care provider what activities are safe for you.  General instructions     Take over-the-counter and prescription medicines only as told by your health care provider.   Do not take baths, swim, or use a hot tub until your health care provider approves. You will be able to take showers.   Check your vaginal area every day for signs of infection. Check for:  ? Redness, swelling, or pain.  ? More discharge or blood, instead of less.  ? Bad-smelling discharge.   Keep all follow-up visits as told by your health care provider. This is important.   Drink enough fluid to keep your urine pale yellow.  Contact a health care provider if you have:   Vaginal redness, swelling, or  pain.   Vaginal discharge or bleeding that gets worse instead of getting better.   Bad-smelling vaginal discharge.   A fever or chills.   Trouble urinating.  Get help right away if you have:   Heavy vaginal bleeding.   Severe cramps.  Summary   After endometrial ablation, it is normal to have thin, watery vaginal discharge that is light pink or brown in color. This may last a few weeks and may be heavier right after the procedure.   Vaginal bleeding is also normal after the procedure and should get better with time.   Check your vaginal area every day for signs of infection, such as bad-smelling discharge.   Keep all follow-up visits as told by your health care provider. This is important.  This information is not intended to replace advice given to you by your health care provider. Make sure you discuss any questions you have with your health care provider.  Document Released: 08/08/2017 Document Revised: 08/08/2017 Document Reviewed: 08/08/2017  Elsevier Interactive Patient Education  2019 [employer]vier Inc.

## 2018-10-19 NOTE — Telephone Encounter (Signed)
Patient notice a little blood this morning when she wiped and was told to call. Scheduled for EMB procedure this afternoon.

## 2018-10-26 ENCOUNTER — Telehealth: Payer: Self-pay | Admitting: Obstetrics and Gynecology

## 2018-10-26 NOTE — Telephone Encounter (Signed)
Patient called again to see if the results are ready for her yet. I let her know that Dr. Quincy Simmonds has not reviewed the results yet and once she does the nurse will call her back with those. Patient expressed understanding.

## 2018-10-26 NOTE — Telephone Encounter (Signed)
Patient called again asking if Dr.Silva has reviewed her results?

## 2018-10-26 NOTE — Telephone Encounter (Signed)
Left detailed message, ok per dpr. Advised her biopsy result have returned, Dr. Quincy Simmonds will review, once reviewed, our office will return call. If any additional questions, return call to office.   Routing to Dr. Quincy Simmonds to review.

## 2018-10-26 NOTE — Telephone Encounter (Signed)
Patient called for biopsy results.

## 2018-10-28 NOTE — Telephone Encounter (Signed)
Please inform patient that her endometrial results are benign.  It showed inactive endometrium.  If she is still bleeding, I would like to have her return to discuss options for treatment.  If her bleeding has stopped, please have her keep a bleeding calendar and call if she develops prolonged bleeding again.

## 2018-10-29 NOTE — Telephone Encounter (Signed)
Spoke with patient, advised as seen below per Dr. Quincy Simmonds. Patient reports she is still bleeding, flow is light. Patient declines to schedule OV at this time, will return call to office to schedule.    Routing to provider for final review. Patient is agreeable to disposition. Will close encounter.

## 2018-11-07 ENCOUNTER — Ambulatory Visit
Admission: RE | Admit: 2018-11-07 | Discharge: 2018-11-07 | Disposition: A | Discharge status: Home / Self Care | Payer: BC Managed Care – PPO | Source: Ambulatory Visit | Attending: Obstetrics and Gynecology | Admitting: Obstetrics and Gynecology

## 2018-11-07 ENCOUNTER — Ambulatory Visit: Payer: BC Managed Care – PPO

## 2018-11-07 DIAGNOSIS — Z1231 Encounter for screening mammogram for malignant neoplasm of breast: Secondary | ICD-10-CM

## 2018-11-07 IMAGING — MG DIGITAL SCREENING BILATERAL MAMMOGRAM WITH CAD
4 series · 4 of 4 positions shown · non-contrast
Comparison: Previous exam(s).

CLINICAL DATA: Screening.

EXAM:
DIGITAL SCREENING BILATERAL MAMMOGRAM WITH CAD

[L CC]
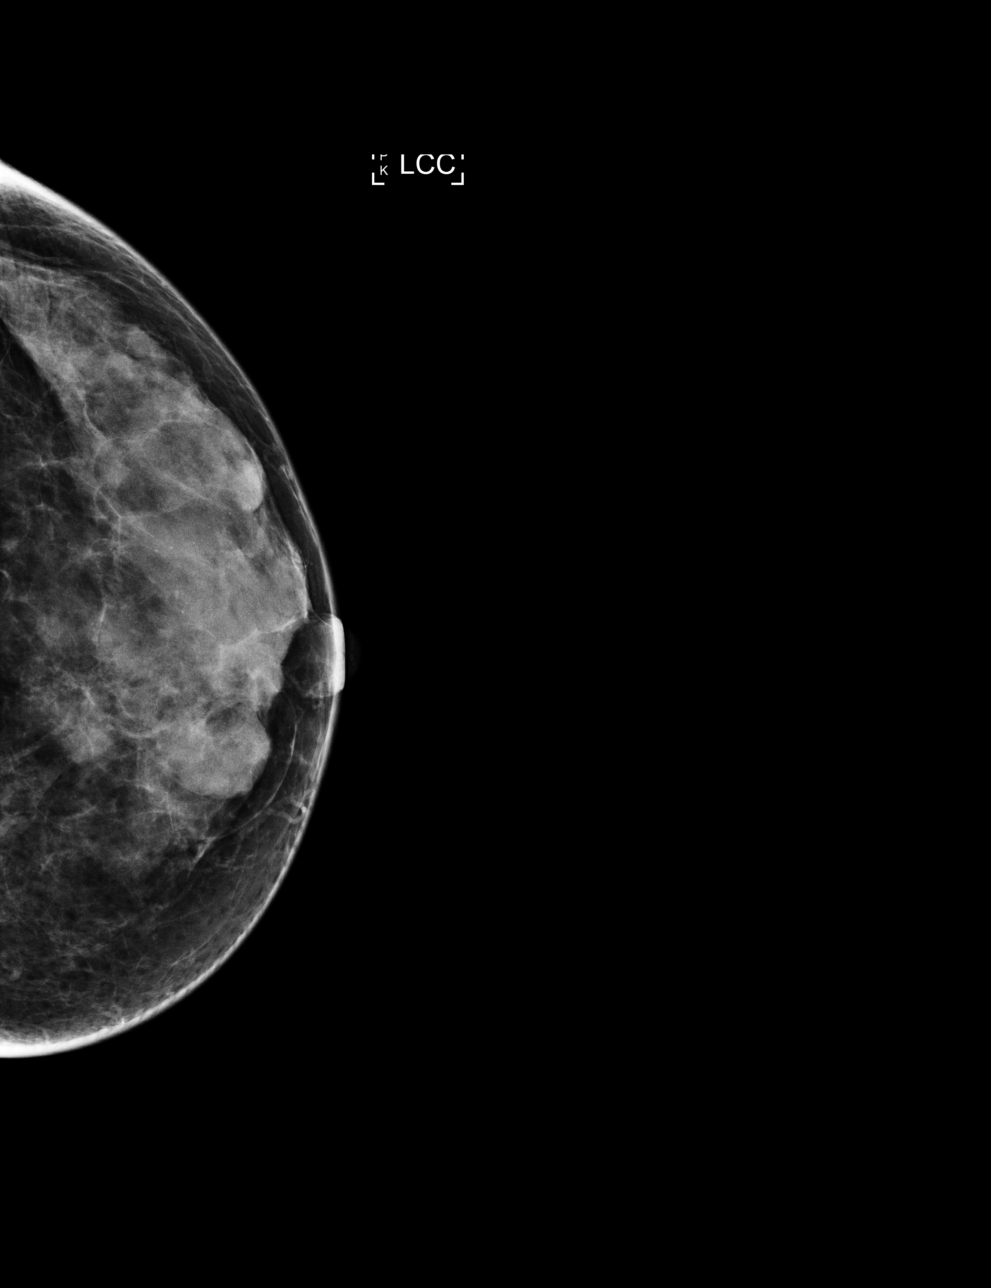

[R CC]
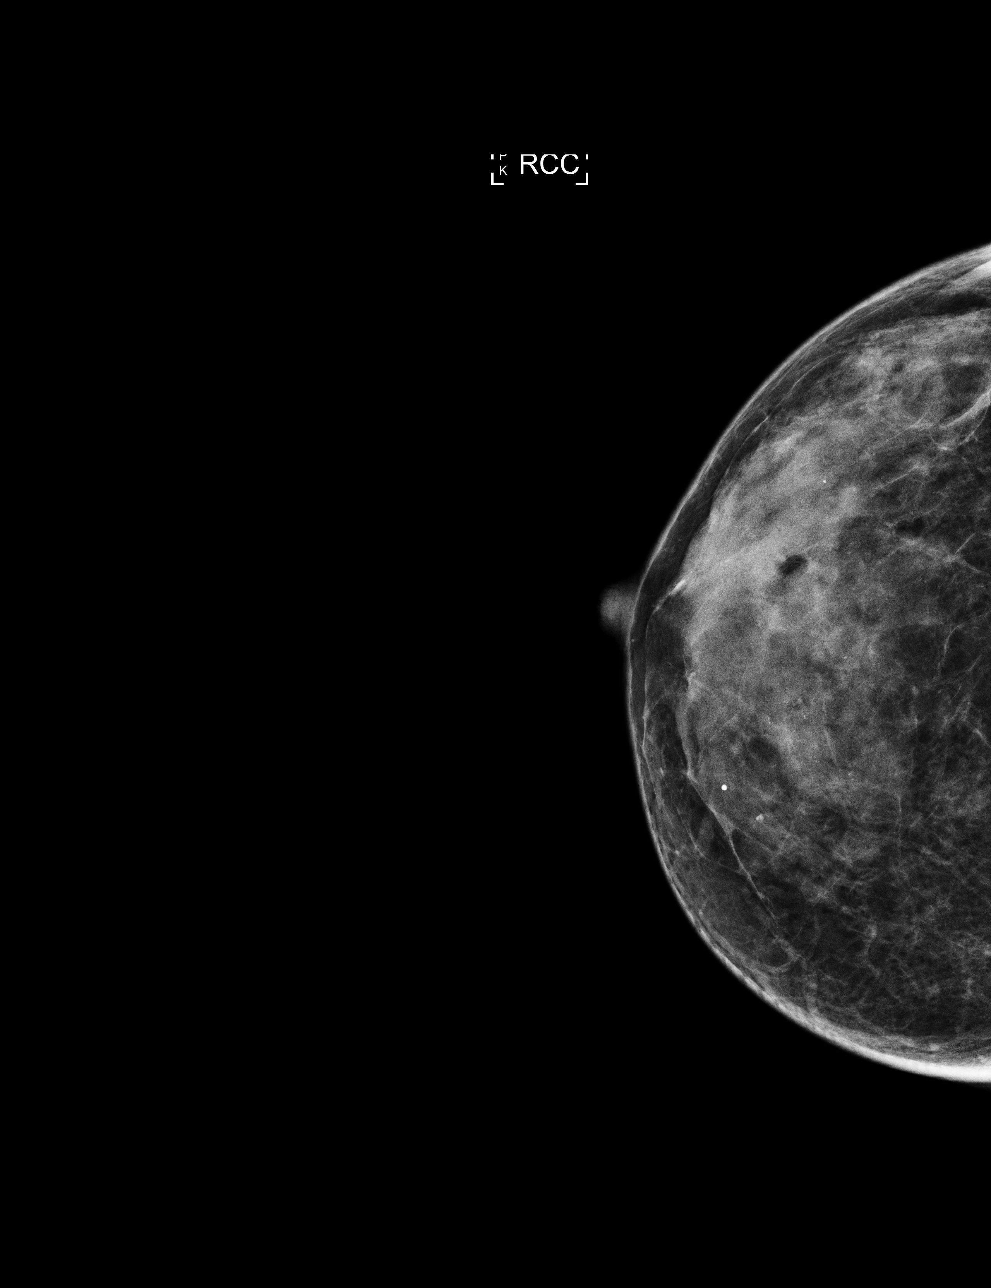

[R MLO]
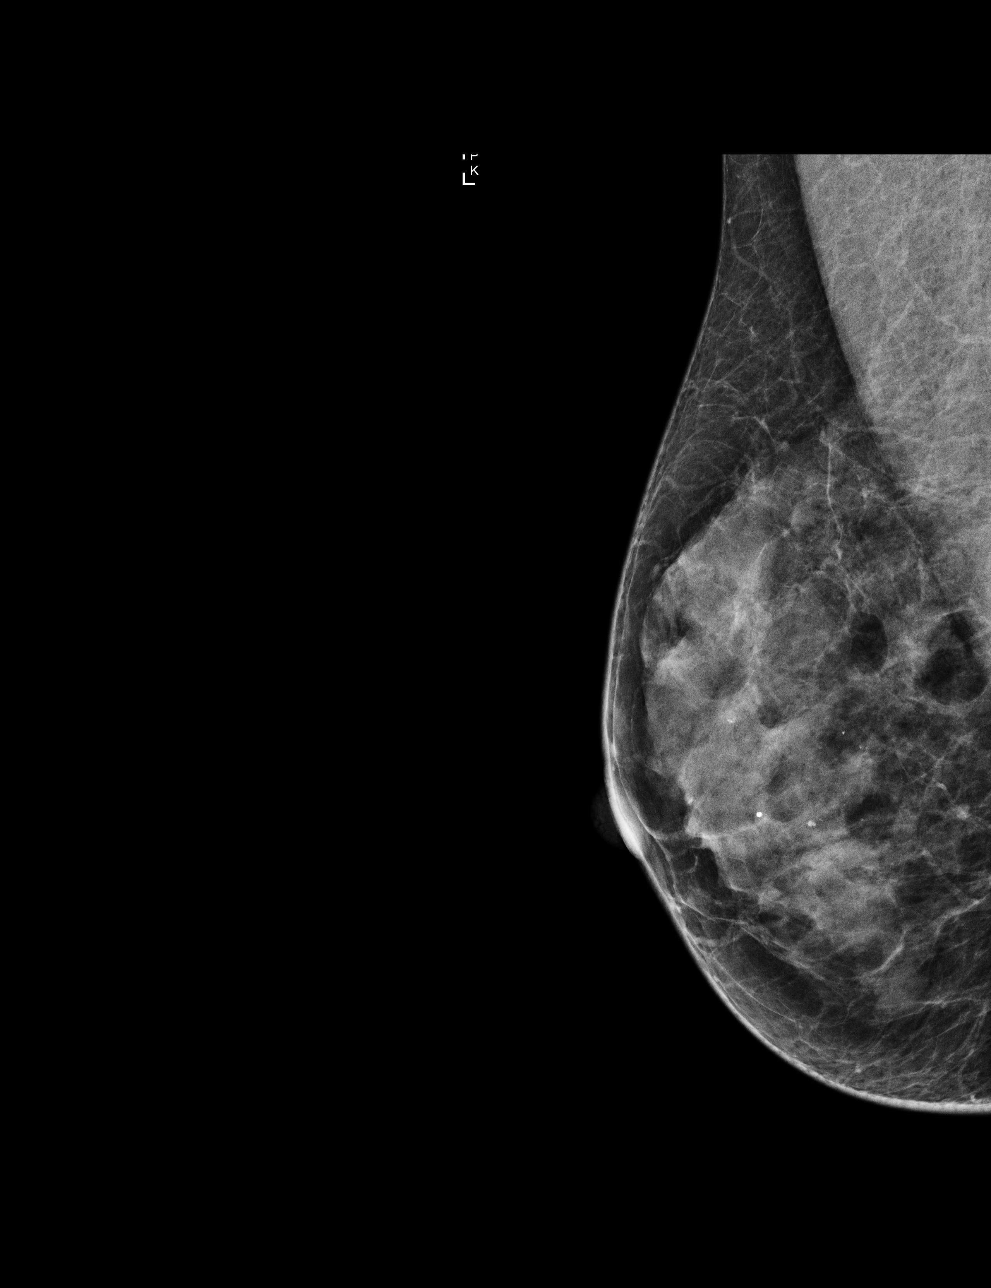

[L MLO]
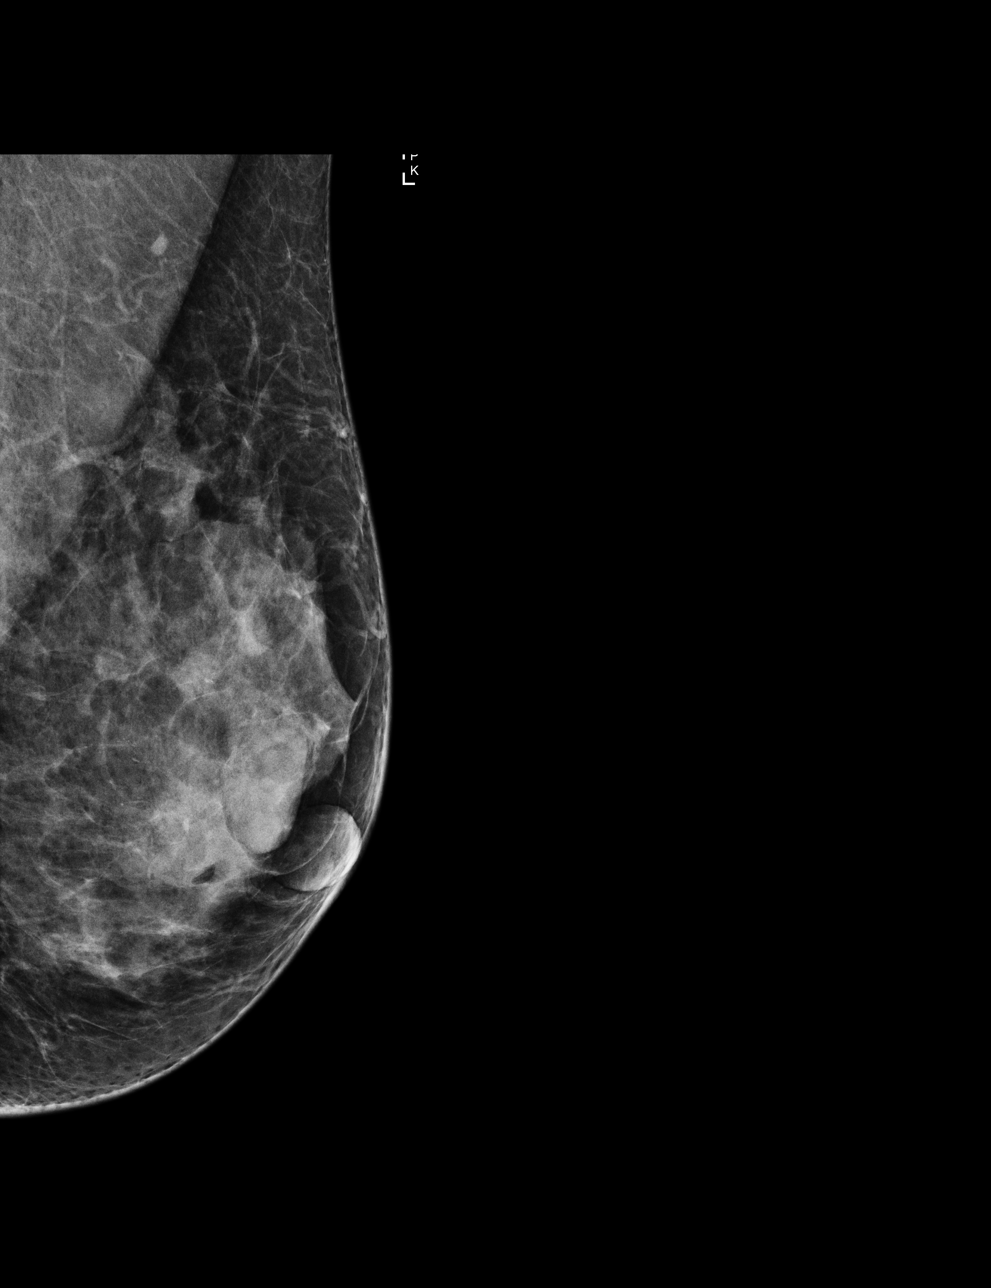

[4 of 4 positions shown; findings below may reference images not displayed]

ACR Breast Density Category c: The breast tissue is heterogeneously
dense, which may obscure small masses.
FINDINGS: There are no findings suspicious for malignancy. Images were
processed with CAD.
IMPRESSION: No mammographic evidence of malignancy. A result letter of this
screening mammogram will be mailed directly to the patient.

RECOMMENDATION:
Screening mammogram in one year. (Code:[0J])

BI-RADS CATEGORY  1: Negative.

## 2018-11-28 ENCOUNTER — Ambulatory Visit: Payer: BC Managed Care – PPO | Admitting: Obstetrics and Gynecology

## 2019-04-15 ENCOUNTER — Telehealth: Payer: Self-pay | Admitting: Obstetrics and Gynecology

## 2019-04-15 NOTE — Telephone Encounter (Signed)
Patient is calling requesting a referral for a PCP.

## 2019-04-18 NOTE — Telephone Encounter (Signed)
Dr.Silva, do you have any specific provider in mind for this patient? Routed to provider

## 2019-04-19 ENCOUNTER — Telehealth: Payer: Self-pay | Admitting: Obstetrics and Gynecology

## 2019-04-19 NOTE — Telephone Encounter (Signed)
Patient has not had a period in over 3 months and having headaches.

## 2019-04-19 NOTE — Telephone Encounter (Signed)
I would recommend a family medicine provider in a [place] so she has continuity of care and the same electronic medical record.  She may want to select an office that is close to her home for convenience.

## 2019-04-19 NOTE — Telephone Encounter (Signed)
Left message to call Amanda, CMA. °

## 2019-04-26 NOTE — Telephone Encounter (Signed)
Spoke with patient. Patient reports she had menses 5 months ago, started spotting again this month. Reports intermittent pelvic pain on right side, denies any pain now. 10/19/18 EMB, benign. Denies any other GYN symptoms. Recommended OV with Dr. Quincy Simmonds for further evaluation. OV scheduled for 7/20 at 3pm with Dr. Quincy Simmonds. [id number] precautions reviewed, prescreen negative.   Routing to provider for final review. Patient is agreeable to disposition. Will close encounter.

## 2019-04-29 ENCOUNTER — Other Ambulatory Visit: Payer: Self-pay

## 2019-04-29 ENCOUNTER — Ambulatory Visit: Payer: BC Managed Care – PPO | Admitting: Obstetrics and Gynecology

## 2019-04-29 ENCOUNTER — Encounter: Payer: Self-pay | Admitting: Obstetrics and Gynecology

## 2019-04-29 VITALS — BP 110/66 | HR 76 | Temp 97.5°F | Resp 12 | Ht 65.0 in | Wt 135.0 lb

## 2019-04-29 DIAGNOSIS — R635 Abnormal weight gain: Secondary | ICD-10-CM | POA: Diagnosis not present

## 2019-04-29 DIAGNOSIS — R102 Pelvic and perineal pain: Secondary | ICD-10-CM | POA: Diagnosis not present

## 2019-04-29 DIAGNOSIS — M2550 Pain in unspecified joint: Secondary | ICD-10-CM | POA: Diagnosis not present

## 2019-04-29 DIAGNOSIS — N912 Amenorrhea, unspecified: Secondary | ICD-10-CM

## 2019-04-29 LAB — POCT URINALYSIS DIPSTICK
Bilirubin, UA: NEGATIVE
Glucose, UA: NEGATIVE
Ketones, UA: NEGATIVE
Nitrite, UA: NEGATIVE
Protein, UA: NEGATIVE
Urobilinogen, UA: 0.2 E.U./dL
pH, UA: 5 (ref 5.0–8.0)

## 2019-04-29 LAB — POCT URINE PREGNANCY: Preg Test, Ur: NEGATIVE

## 2019-04-29 MED ORDER — SULFAMETHOXAZOLE-TRIMETHOPRIM 800-160 MG PO TABS: 1.0000 | ORAL_TABLET | Freq: Two times a day (BID) | ORAL | 0 refills | Status: DC

## 2019-04-29 NOTE — Progress Notes (Signed)
GYNECOLOGY  VISIT   HPI: 48 y.o.   Widowed  [place]  female   [id number] with Patient's last menstrual period was 01/01/2019 (within days).   here for pelvic pain and amenorrhea; per patient LMP in March 2020. Patient also complains of having pain in bones particularly in the right elbow    Patient has a known uterine fibroid, measuring 5.87 cm on US done 07/05/18. She had an endometrial biopsy due to irregular menses on 10/19/18. It showed benign inactive endometrium.   She has a little bleeding every other week with wiping after voiding.  No pain with urination.   She lost her father in March. She was not able to travel to be with him due to the pandemic. She is able to function and take care of her children.  She has elbow pain, but pain everywhere in her bones for 2 - 3 months.  She has groin pain.  She had a headache in her bone also.   She has some shortness of breath, starting a few months ago, and is independent of her activity level.  Some chest pain but it can occur when she is washing dishes.  No fevers.  No cough or congesting.  No diarrhea or abdominal pain.  No flu symptoms.   Feeling stressed and eating but is not hungry.  Gaining weight.   UPT: negative Urine: large amount of RBC, moderate amount WBC, dark yellow, cloud  PCP:  None.   GYNECOLOGIC HISTORY: Patient's last menstrual period was 01/01/2019 (within days). Contraception:  Abstinence Menopausal hormone therapy:  none Last mammogram:  11/07/18 BIRADS 1 negative/density c Last pap smear:   11/17/17 Neg:Neg HR HPV        OB History    Gravida  5   Para  5   Term  5   Preterm      AB      Living  5     SAB      TAB      Ectopic      Multiple  1   Live Births                 Patient Active Problem List   Diagnosis Date Noted  . Uterine leiomyoma 11/30/2017  . Symptomatic anemia 08/02/2016    Past Medical History:  Diagnosis Date  . Anemia    iron deficiency  . Fibroid      Past Surgical History:  Procedure Laterality Date  . EYE SURGERY Bilateral   . TONSILLECTOMY      Current Outpatient Medications  Medication Sig Dispense Refill  . Multiple Vitamin (MULTIVITAMIN) tablet Take 1 tablet by mouth daily.     No current facility-administered medications for this visit.      ALLERGIES: Patient has no known allergies.  Family History  Problem Relation Age of Onset  . Hypertension Mother   . Cancer Father   . Diabetes Father   . Diabetes Brother     Social History   Socioeconomic History  . Marital status: Widowed    Spouse name: Not on file  . Number of children: Not on file  . Years of education: Not on file  . Highest education level: Not on file  Occupational History  . Occupation: Scientist, water quality    Comment: [employer]  . Financial resource strain: Not on file  . Food insecurity    Worry: Not on file    Inability: Not on file  .  Transportation needs    Medical: Not on file    Non-medical: Not on file  Tobacco Use  . Smoking status: Former Smoker    Packs/day: 0.25    Years: 8.00    Pack years: 2.00    Types: Cigarettes    Quit date: 10/10/2012    Years since quitting: 6.5  . Smokeless tobacco: Never Used  Substance and Sexual Activity  . Alcohol use: No    Alcohol/week: 0.0 standard drinks  . Drug use: No  . Sexual activity: Not Currently    Birth control/protection: None  Lifestyle  . Physical activity    Days per week: Not on file    Minutes per session: Not on file  . Stress: Not on file  Relationships  . Social Herbalist on phone: Not on file    Gets together: Not on file    Attends religious service: Not on file    Active member of club or organization: Not on file    Attends meetings of clubs or organizations: Not on file    Relationship status: Not on file  . Intimate partner violence    Fear of current or ex partner: Not on file    Emotionally abused: Not on file     Physically abused: Not on file    Forced sexual activity: Not on file  Other Topics Concern  . Not on file  Social History Narrative   Widowed December 27, 2015. Husband died from [place] disease.     Review of Systems  Constitutional: Negative.   HENT: Negative.   Eyes: Negative.   Respiratory: Negative.   Cardiovascular: Negative.   Gastrointestinal: Negative.   Endocrine: Negative.   Genitourinary: Positive for pelvic pain.  Musculoskeletal: Positive for arthralgias.  Skin: Negative.   Allergic/Immunologic: Negative.   Neurological: Negative.   Hematological: Negative.   Psychiatric/Behavioral: Negative.     PHYSICAL EXAMINATION:    BP 110/66 (BP Location: Left Arm, Patient Position: Sitting, Cuff Size: Normal)   Pulse 76   Temp (!) 97.5 F (36.4 C) (Temporal)   Resp 12   Ht 5\' 5"  (1.651 m)   Wt 135 lb (61.2 kg)   LMP 01/01/2019 (Within Days)   BMI 22.47 kg/m     General appearance: alert, cooperative and appears stated age Head: Normocephalic, without obvious abnormality, atraumatic Neck: no adenopathy, supple, symmetrical, trachea midline and thyroid normal to inspection and palpation Lungs: clear to auscultation bilaterally Heart: regular rate and rhythm Abdomen: soft, non-tender, no masses,  no organomegaly No abnormal inguinal nodes palpated Neurologic: Grossly normal  Pelvic: External genitalia:  no lesions              Urethra:  normal appearing urethra with no masses, tenderness or lesions              Bartholins and Skenes: normal                 Vagina: normal appearing vagina with normal color and discharge, no lesions              Cervix: no lesions                Bimanual Exam:  Uterus:  7 week size, nontender.              Adnexa: no mass, fullness, tenderness            Chaperone was present for exam.  ASSESSMENT  Joint pain.  Pelvic pain. ? Etiology.  Weight gain.  Amenorrhea. Perimenopausal female.  Bereavement.   PLAN  We discussed  perimenopause, UTI, joint pain, and bereavement. Will check TSH, CBC, CMP, FSH, E2. Bactrim DS po bid x 3 days.  Urine micro and cx sent.  Brochure on menopause.  She will establish care with a PCP.  Support given for the loss of her father.   An After Visit Summary was printed and given to the patient.  __25___ minutes face to face time of which over 50% was spent in counseling.

## 2019-04-29 NOTE — Telephone Encounter (Signed)
Patient has appointment 04-29-19.

## 2019-04-30 LAB — URINALYSIS, MICROSCOPIC ONLY: Casts: NONE SEEN /lpf

## 2019-05-01 LAB — COMPREHENSIVE METABOLIC PANEL
ALT: 19 IU/L (ref 0–32)
AST: 22 IU/L (ref 0–40)
Albumin/Globulin Ratio: 2 (ref 1.2–2.2)
Albumin: 4.6 g/dL (ref 3.8–4.8)
Alkaline Phosphatase: 54 IU/L (ref 39–117)
BUN/Creatinine Ratio: 20 (ref 9–23)
BUN: 16 mg/dL (ref 6–24)
Bilirubin Total: 0.6 mg/dL (ref 0.0–1.2)
CO2: 21 mmol/L (ref 20–29)
Calcium: 9.8 mg/dL (ref 8.7–10.2)
Chloride: 106 mmol/L (ref 96–106)
Creatinine, Ser: 0.8 mg/dL (ref 0.57–1.00)
GFR calc Af Amer: 100 mL/min/{1.73_m2} (ref >59)
GFR calc non Af Amer: 87 mL/min/{1.73_m2} (ref >59)
Globulin, Total: 2.3 g/dL (ref 1.5–4.5)
Glucose: 90 mg/dL (ref 65–99)
Potassium: 3.8 mmol/L (ref 3.5–5.2)
Sodium: 146 mmol/L — ABNORMAL HIGH (ref 134–144)
Total Protein: 6.9 g/dL (ref 6.0–8.5)

## 2019-05-01 LAB — CBC
Hematocrit: 36.4 % (ref 34.0–46.6)
Hemoglobin: 11.9 g/dL (ref 11.1–15.9)
MCH: 31.1 pg (ref 26.6–33.0)
MCHC: 32.7 g/dL (ref 31.5–35.7)
MCV: 95 fL (ref 79–97)
Platelets: 238 10*3/uL (ref 150–450)
RBC: 3.83 x10E6/uL (ref 3.77–5.28)
RDW: 12 % (ref 11.7–15.4)
WBC: 5.5 10*3/uL (ref 3.4–10.8)

## 2019-05-01 LAB — ESTRADIOL: Estradiol: 41.2 pg/mL

## 2019-05-01 LAB — URINE CULTURE

## 2019-05-01 LAB — FOLLICLE STIMULATING HORMONE: FSH: 117.4 m[IU]/mL

## 2019-05-01 LAB — TSH: TSH: 1.49 u[IU]/mL (ref 0.450–4.500)

## 2019-05-02 ENCOUNTER — Telehealth: Payer: Self-pay | Admitting: Obstetrics and Gynecology

## 2019-05-02 NOTE — Telephone Encounter (Signed)
Spoke with patient, advised as seen below per Dr. Silva.  Patient verbalizes understanding and is agreeable.  Encounter closed.  

## 2019-05-02 NOTE — Telephone Encounter (Signed)
-----   Message from Nunzio Cobbs, MD sent at 05/02/2019  6:17 AM EDT ----- Please contact patient with results. She is perimenopausal by her labs.  Her [place] is elevated, but she still is producing some premenopausal levels of estrogen.  This means she could still have a menstruation.  Please have her keep a bleeding calendar and call if her cycles are frequent or irregular.   Her urine culture is negative for infection.  Her urinalysis did show some crystals, which is a potential sign of kidney stones.  Have her hydrate well! If she notices blood in her urine or has significant back or pelvic pain, she may need to have further evaluation for kidney stones with a CT scan.   Her thyroid, blood chemistries, and blood counts all looked good.

## 2019-05-02 NOTE — Telephone Encounter (Signed)
Patient is calling requesting lab results from [facility] 04/29/2019.

## 2019-06-20 ENCOUNTER — Ambulatory Visit: Payer: BC Managed Care – PPO | Admitting: Obstetrics and Gynecology

## 2019-09-26 DIAGNOSIS — F332 Major depressive disorder, recurrent severe without psychotic features: Secondary | ICD-10-CM | POA: Insufficient documentation

## 2019-09-27 ENCOUNTER — Other Ambulatory Visit: Payer: Self-pay | Admitting: Cardiology

## 2019-09-27 DIAGNOSIS — Z20822 Contact with and (suspected) exposure to covid-19: Secondary | ICD-10-CM

## 2019-09-28 LAB — NOVEL CORONAVIRUS, NAA: SARS-CoV-2, NAA: NOT DETECTED

## 2020-02-24 ENCOUNTER — Other Ambulatory Visit: Payer: Self-pay

## 2020-02-26 NOTE — Progress Notes (Signed)
GYNECOLOGY  VISIT   HPI: 49 y.o.   Widowed from [place]  female   [id number] with Patient's last menstrual period was 02/26/2020 (exact date).   here for vaginal irritation and low back pain. Vaginal spotting yesterday after 6 months of no menses. She has had some spotting for 3 - 4 days prior to this.  Some hot flashes but less than before.  She has lower back pain bilaterally.   She has some pain with urination.   She has vaginal burning and notes odor.   FSH 117.4 on 04/28/20.  Patient also complaining of fecal incontinence.   This is occurring for one year.  It increases when she walks.   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  She is taking Remron and seeing a Social worker.  Urine Dip: Trace WBCs, Trace RBCs  GYNECOLOGIC HISTORY: Patient's last menstrual period was 02/26/2020 (exact date). Contraception:  Abstinence Menopausal hormone therapy:  none Last mammogram: 11/07/18 BIRADS 1 negative/density c Last pap smear:   11/17/17 Neg:Neg HR HPV        OB History    Gravida  5   Para  5   Term  5   Preterm      AB      Living  5     SAB      TAB      Ectopic      Multiple  1   Live Births                 Patient Active Problem List   Diagnosis Date Noted  . Uterine leiomyoma 11/30/2017  . Symptomatic anemia 08/02/2016    Past Medical History:  Diagnosis Date  . Anemia    iron deficiency  . Fibroid     Past Surgical History:  Procedure Laterality Date  . EYE SURGERY Bilateral   . TONSILLECTOMY      Current Outpatient Medications  Medication Sig Dispense Refill  . Ascorbic Acid (VITAMIN C) 1000 MG tablet Take 1,000 mg by mouth daily.    . Cholecalciferol (VITAMIN D3) 125 MCG (5000 UT) CAPS Take 1 capsule by mouth daily.    . hydrOXYzine (VISTARIL) 25 MG capsule Take 25 mg by mouth 2 (two) times daily.    . mirtazapine (REMERON) 15 MG tablet Take 1 tab at night    . tretinoin (RETIN-A) 0.025 % cream Apply 1 application  topically at bedtime.     No current facility-administered medications for this visit.     ALLERGIES: Patient has no known allergies.  Family History  Problem Relation Age of Onset  . Hypertension Mother   . Cancer Father   . Diabetes Father   . Diabetes Brother     Social History   Socioeconomic History  . Marital status: Widowed    Spouse name: Not on file  . Number of children: Not on file  . Years of education: Not on file  . Highest education level: Not on file  Occupational History  . Occupation: Scientist, water quality    Comment: Runner, broadcasting/film/video  Tobacco Use  . Smoking status: Former Smoker    Packs/day: 0.25    Years: 8.00    Pack years: 2.00    Types: Cigarettes    Quit date: 10/10/2012    Years since quitting: 7.3  . Smokeless tobacco: Never Used  Substance and Sexual Activity  . Alcohol use: No    Alcohol/week: 0.0 standard drinks  . Drug  use: No  . Sexual activity: Not Currently    Birth control/protection: None  Other Topics Concern  . Not on file  Social History Narrative   Widowed 12-05-2015. Husband died from [place] disease.    Social Determinants of Health   Financial Resource Strain:   . Difficulty of Paying Living Expenses:   Food Insecurity:   . Worried About Charity fundraiser in the Last Year:   . Arboriculturist in the Last Year:   Transportation Needs:   . Film/video editor (Medical):   Marland Kitchen Lack of Transportation (Non-Medical):   Physical Activity:   . Days of Exercise per Week:   . Minutes of Exercise per Session:   Stress:   . Feeling of Stress :   Social Connections:   . Frequency of Communication with Friends and Family:   . Frequency of Social Gatherings with Friends and Family:   . Attends Religious Services:   . Active Member of Clubs or Organizations:   . Attends Archivist Meetings:   Marland Kitchen Marital Status:   Intimate Partner Violence:   . Fear of Current or Ex-Partner:   . Emotionally Abused:   Marland Kitchen Physically  Abused:   . Sexually Abused:     Review of Systems  Gastrointestinal:       Fecal incontinence  Genitourinary: Positive for flank pain (bilateral).  All other systems reviewed and are negative.   PHYSICAL EXAMINATION:    BP 100/70   Pulse 60   Temp (!) 97.5 F (36.4 C) (Temporal)   Ht 5\' 5"  (1.651 m)   Wt 131 lb 6.4 oz (59.6 kg)   LMP 02/26/2020 (Exact Date)   BMI 21.87 kg/m     General appearance: alert, cooperative and appears stated age   Abdomen: soft, non-tender, no masses,  no organomegaly Back: +- bilateral CVA tenderness.  No abnormal inguinal nodes palpated  Pelvic: External genitalia:  no lesions              Urethra:  normal appearing urethra with no masses, tenderness or lesions              Bartholins and Skenes: normal                 Vagina: normal appearing vagina with normal color and yellow, orange clear discharge.  No lesions              Cervix: no lesions                Bimanual Exam:  Uterus:  12 - 13 week size uterus.               Adnexa: no mass, fullness, tenderness              Rectal exam: Yes.  .  Confirms.              Anus:  normal sphincter tone, no lesions  Chaperone was present for exam.  ASSESSMENT  Dysuria.  Vaginal bleeding.  Vaginal odor.  Postmenopausal bleeding.  Fecal incontinence. Need for colon cancer screening.  PLAN  Urine micro and cx.  Bactrim DS po bid x 3 days.  Metamucil.  Referral to GI. Affirm.  Check FSH and E2.  Return for pelvic US and EMB.  Return for annual exam in 1 month.   An After Visit Summary was printed and given to the patient.  _30____ minutes face to face time of  which over 50% was spent in counseling.

## 2020-02-27 ENCOUNTER — Other Ambulatory Visit: Payer: Self-pay

## 2020-02-27 ENCOUNTER — Other Ambulatory Visit: Payer: Self-pay | Admitting: Obstetrics and Gynecology

## 2020-02-27 ENCOUNTER — Encounter: Payer: Self-pay | Admitting: Obstetrics and Gynecology

## 2020-02-27 ENCOUNTER — Ambulatory Visit: Payer: BC Managed Care – PPO | Admitting: Obstetrics and Gynecology

## 2020-02-27 VITALS — BP 100/70 | HR 60 | Temp 97.5°F | Ht 65.0 in | Wt 131.4 lb

## 2020-02-27 DIAGNOSIS — Z1211 Encounter for screening for malignant neoplasm of colon: Secondary | ICD-10-CM

## 2020-02-27 DIAGNOSIS — M545 Low back pain, unspecified: Secondary | ICD-10-CM

## 2020-02-27 DIAGNOSIS — N898 Other specified noninflammatory disorders of vagina: Secondary | ICD-10-CM

## 2020-02-27 DIAGNOSIS — R300 Dysuria: Secondary | ICD-10-CM

## 2020-02-27 DIAGNOSIS — R159 Full incontinence of feces: Secondary | ICD-10-CM | POA: Diagnosis not present

## 2020-02-27 DIAGNOSIS — R8290 Unspecified abnormal findings in urine: Secondary | ICD-10-CM

## 2020-02-27 DIAGNOSIS — N950 Postmenopausal bleeding: Secondary | ICD-10-CM | POA: Diagnosis not present

## 2020-02-27 DIAGNOSIS — N939 Abnormal uterine and vaginal bleeding, unspecified: Secondary | ICD-10-CM

## 2020-02-27 DIAGNOSIS — Z1231 Encounter for screening mammogram for malignant neoplasm of breast: Secondary | ICD-10-CM

## 2020-02-27 LAB — POCT URINALYSIS DIPSTICK
Bilirubin, UA: NEGATIVE
Glucose, UA: NEGATIVE
Ketones, UA: NEGATIVE
Nitrite, UA: NEGATIVE
Protein, UA: NEGATIVE
Urobilinogen, UA: 0.2 E.U./dL
pH, UA: 5 (ref 5.0–8.0)

## 2020-02-27 MED ORDER — SULFAMETHOXAZOLE-TRIMETHOPRIM 800-160 MG PO TABS: 1.0000 | ORAL_TABLET | Freq: Two times a day (BID) | ORAL | 0 refills | Status: DC

## 2020-02-27 NOTE — Patient Instructions (Signed)
Psyllium oral capsule What is this medicine? PSYLLIUM (SIL i yum) is a bulk-forming fiber laxative. This medicine is used to treat constipation. Increasing fiber in the diet may also help lower cholesterol and promote heart health for some people. This medicine may be used for other purposes; ask your health care provider or pharmacist if you have questions. COMMON BRAND NAME(S): GenFiber, Konsyl, Metamucil, Metamucil MultiHealth, Natural Fiber Laxative, Reguloid What should I tell my health care provider before I take this medicine? They need to know if you have any of these conditions:  blockage in your bowel  difficulty swallowing  inflammatory bowel disease  stomach or intestine problems  sudden change in bowel habits lasting more than 2 weeks  an unusual or allergic reaction to psyllium, other medicines, dyes, or preservatives  pregnant or trying or get pregnant  breast-feeding How should I use this medicine? Take this medicine by mouth with a full glass of water. Follow the directions on the package labeling, or take as directed by your health care professional. Take your medicine at regular intervals. Do not take your medicine more often than directed. Talk to your pediatrician regarding the use of this medicine in children. While this drug may be prescribed for children as young as 70 years of age for selected conditions, precautions do apply. Overdosage: If you think you have taken too much of this medicine contact a poison control center or emergency room at once. NOTE: This medicine is only for you. Do not share this medicine with others. What if I miss a dose? If you miss a dose, take it as soon as you can. If it is almost time for your next dose, take only that dose. Do not take double or extra doses. What may interact with this medicine? Interactions are not expected. Take this product at least 2 hours before or after other medicines. This list may not describe all  possible interactions. Give your health care provider a list of all the medicines, herbs, non-prescription drugs, or dietary supplements you use. Also tell them if you smoke, drink alcohol, or use illegal drugs. Some items may interact with your medicine. What should I watch for while using this medicine? Check with your doctor or health care professional if your symptoms do not start to get better or if they get worse. Stop using this medicine and contact your doctor or health care professional if you have rectal bleeding or if you have to treat your constipation for more than 1 week. These could be signs of a more serious condition. Drink several glasses of water a day while you are taking this medicine. This will help to relieve constipation and prevent dehydration. What side effects may I notice from receiving this medicine? Side effects that you should report to your doctor or health care professional as soon as possible:  allergic reactions like skin rash, itching or hives, swelling of the face, lips, or tongue  breathing problems  chest pain  nausea, vomiting  rectal bleeding  trouble swallowing Side effects that usually do not require medical attention (report to your doctor or health care professional if they continue or are bothersome):  bloating  gas  stomach cramps This list may not describe all possible side effects. Call your doctor for medical advice about side effects. You may report side effects to FDA at 1-800-FDA-1088. Where should I keep my medicine? Keep out of the reach of children. Store at room temperature between 15 and 30 degrees C (59  and 86 degrees F). Protect from moisture. Throw away any unused medicine after the expiration date. NOTE: This sheet is a summary. It may not cover all possible information. If you have questions about this medicine, talk to your doctor, pharmacist, or health care provider.  2020 Elsevier/Gold Standard (2018-02-20 15:56:42)

## 2020-02-28 LAB — URINALYSIS, MICROSCOPIC ONLY
Bacteria, UA: NONE SEEN
Casts: NONE SEEN /lpf
Epithelial Cells (non renal): NONE SEEN /hpf (ref 0–10)
RBC, Urine: NONE SEEN /hpf (ref 0–2)
WBC, UA: NONE SEEN /hpf (ref 0–5)

## 2020-02-28 LAB — ESTRADIOL: Estradiol: <5 pg/mL

## 2020-02-28 LAB — VAGINITIS/VAGINOSIS, DNA PROBE
Candida Species: NEGATIVE
Gardnerella vaginalis: NEGATIVE
Trichomonas vaginosis: NEGATIVE

## 2020-02-28 LAB — FOLLICLE STIMULATING HORMONE: FSH: 170 m[IU]/mL

## 2020-02-29 LAB — URINE CULTURE: Organism ID, Bacteria: NO GROWTH

## 2020-03-04 ENCOUNTER — Other Ambulatory Visit: Payer: Self-pay

## 2020-03-05 ENCOUNTER — Ambulatory Visit: Payer: BC Managed Care – PPO | Admitting: Obstetrics and Gynecology

## 2020-03-05 ENCOUNTER — Encounter: Payer: Self-pay | Admitting: Obstetrics and Gynecology

## 2020-03-05 ENCOUNTER — Ambulatory Visit (INDEPENDENT_AMBULATORY_CARE_PROVIDER_SITE_OTHER): Payer: BC Managed Care – PPO

## 2020-03-05 ENCOUNTER — Ambulatory Visit
Admission: RE | Admit: 2020-03-05 | Discharge: 2020-03-05 | Disposition: A | Discharge status: Home / Self Care | Payer: BC Managed Care – PPO | Source: Ambulatory Visit | Attending: Obstetrics and Gynecology | Admitting: Obstetrics and Gynecology

## 2020-03-05 VITALS — BP 100/66 | HR 60 | Temp 97.3°F | Ht 65.0 in | Wt 128.6 lb

## 2020-03-05 DIAGNOSIS — N950 Postmenopausal bleeding: Secondary | ICD-10-CM | POA: Diagnosis not present

## 2020-03-05 DIAGNOSIS — R300 Dysuria: Secondary | ICD-10-CM | POA: Diagnosis not present

## 2020-03-05 DIAGNOSIS — Z1231 Encounter for screening mammogram for malignant neoplasm of breast: Secondary | ICD-10-CM

## 2020-03-05 IMAGING — MG DIGITAL SCREENING BILAT W/ CAD
5 series · 5 of 5 positions shown · non-contrast
Comparison: Previous exam(s).

CLINICAL DATA: Screening.

EXAM:
DIGITAL SCREENING BILATERAL MAMMOGRAM WITH CAD

[L MLO]
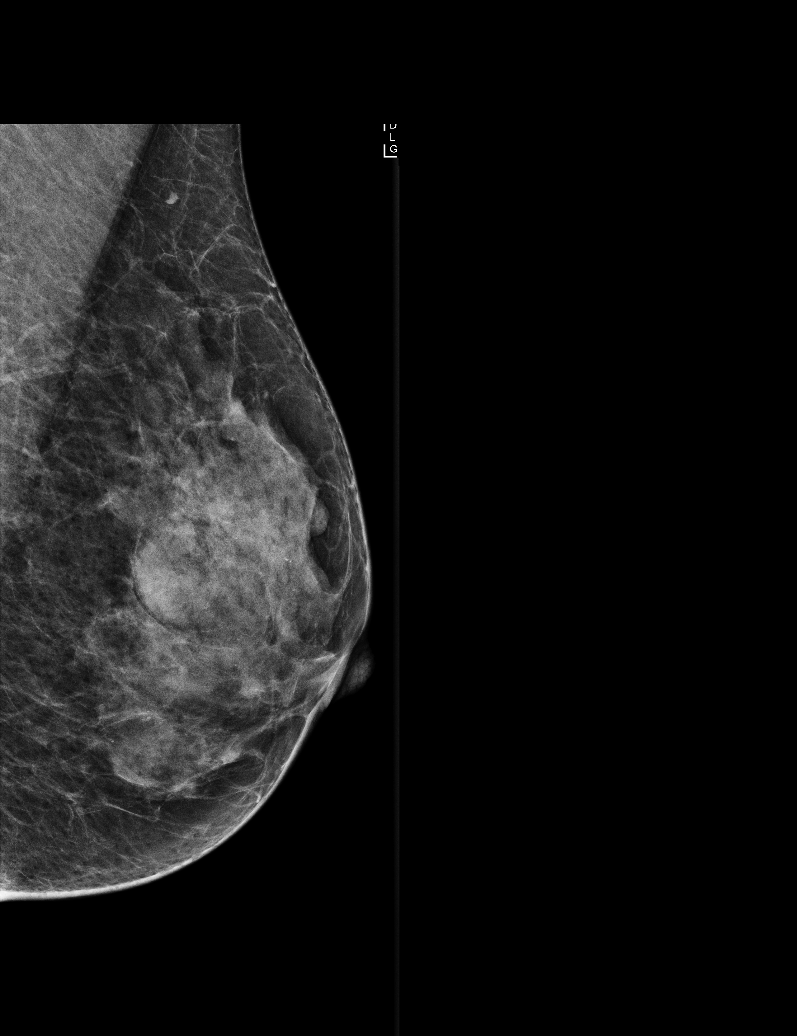

[R MLO]
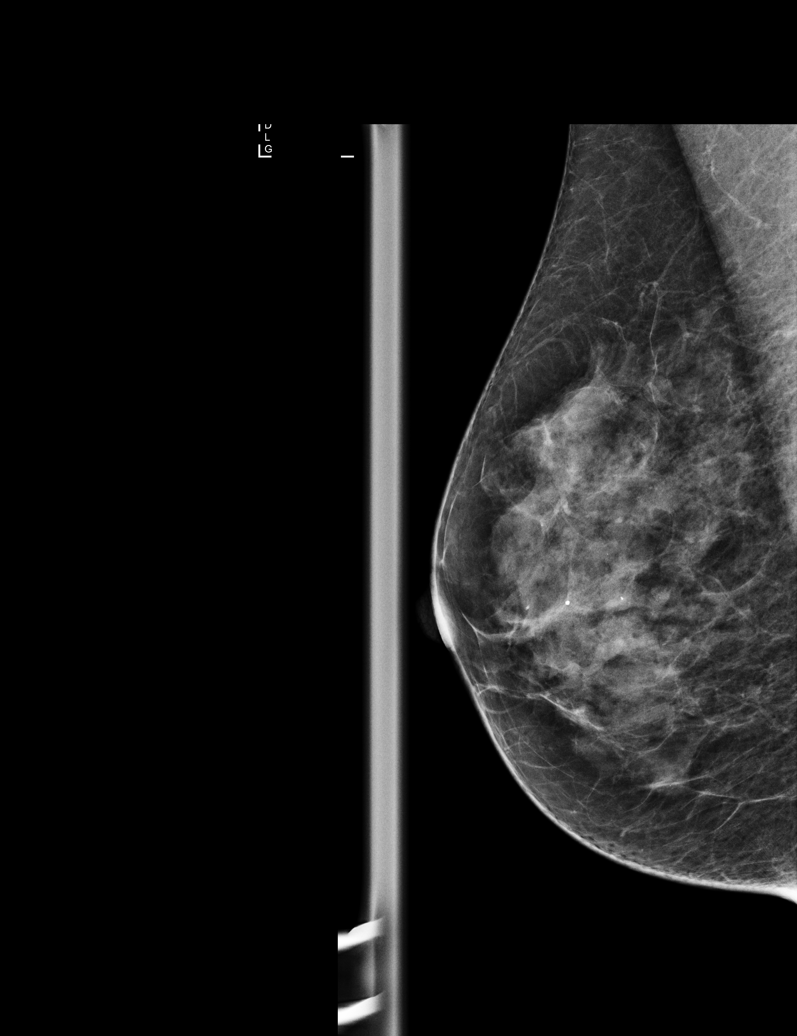

[L CC (1 of 2)]
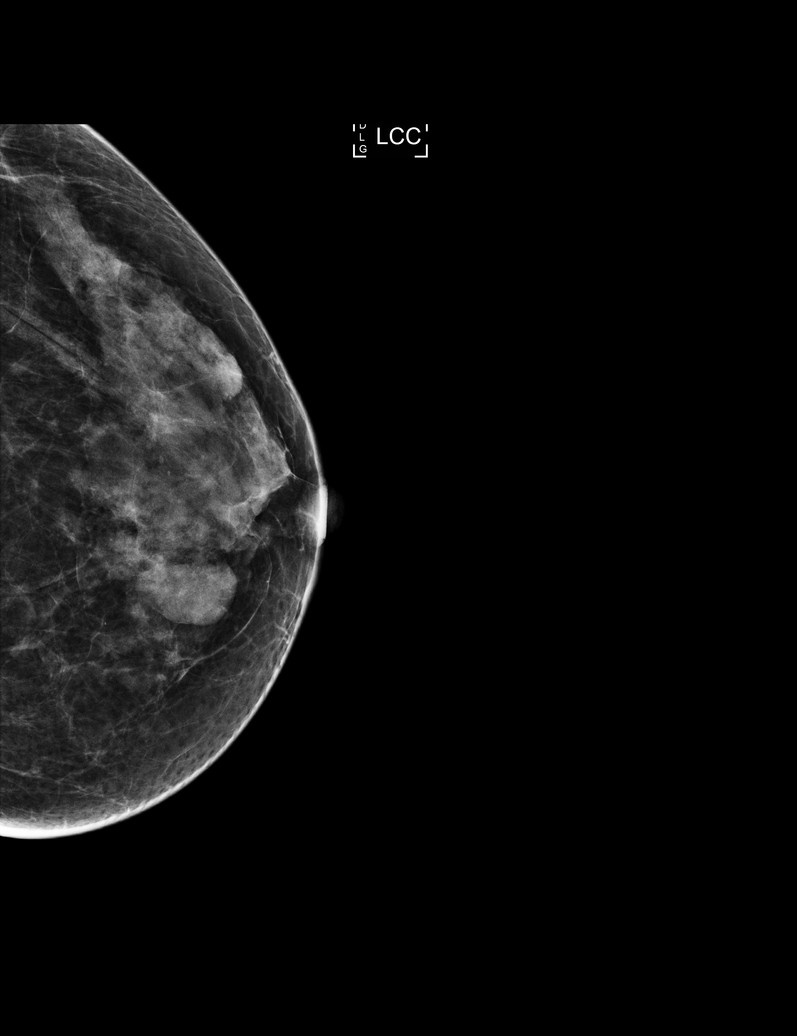

[L CC (2 of 2)]
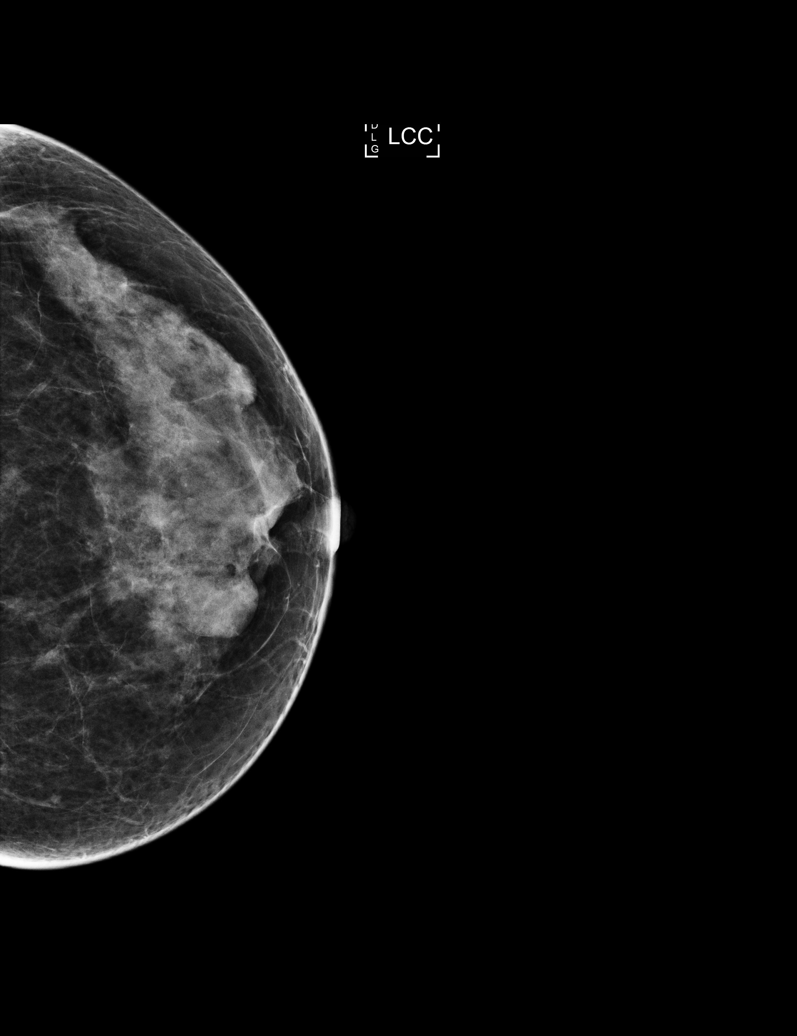

[R CC]
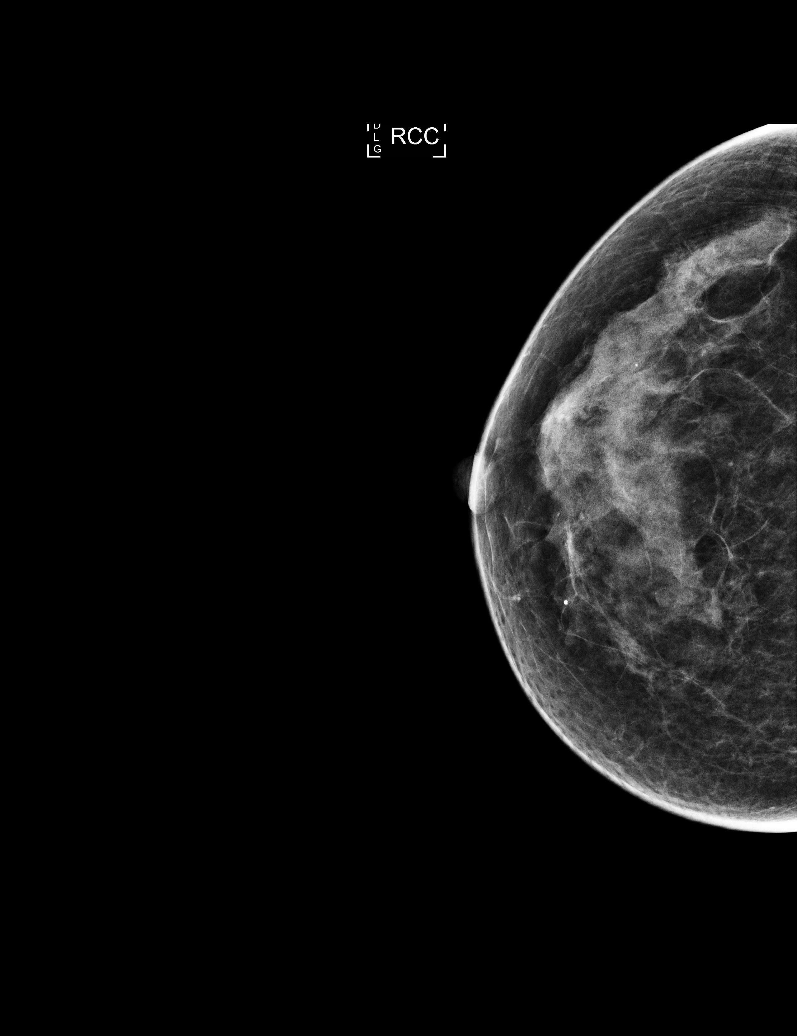

[5 of 5 positions shown; findings below may reference images not displayed]

ACR Breast Density Category d: The breast tissue is extremely dense,
which lowers the sensitivity of mammography
FINDINGS: There are no findings suspicious for malignancy. Images were
processed with CAD.
IMPRESSION: No mammographic evidence of malignancy. A result letter of this
screening mammogram will be mailed directly to the patient.

RECOMMENDATION:
Screening mammogram in one year. (Code:[CA])

BI-RADS CATEGORY  1: Negative.

## 2020-03-05 NOTE — Progress Notes (Signed)
Encounter reviewed by Dr. Brook Amundson C. Silva.  

## 2020-03-05 NOTE — Progress Notes (Signed)
GYNECOLOGY  VISIT   HPI: 49 y.o.   Widowed  [place]  female   [id number] with Patient's last menstrual period was 02/26/2020 (exact date).   here for pelvic ultrasound for postmenopausal bleeding.   She had not had a menses for 6 months.   FSH 117.4 on 04/28/20.  [place] 170.0 and E2 < 5 on 5/0/21.  She has a known large uterine fibroid. Prior US 07/05/18 fibroid 5.87 cm.  She has dysuria, and her urine culture is negative.  She is sexually active.  GYNECOLOGIC HISTORY: Patient's last menstrual period was 02/26/2020 (exact date). Contraception: Abstinence Menopausal hormone therapy:  Last mammogram:  11/07/18 BIRADS 1 negative/density c--results pending from today Last pap smear: 11/17/17 Neg:Neg HR HPV        OB History    Gravida  5   Para  5   Term  5   Preterm      AB      Living  5     SAB      TAB      Ectopic      Multiple  1   Live Births                 Patient Active Problem List   Diagnosis Date Noted  . Uterine leiomyoma 11/30/2017  . Symptomatic anemia 08/02/2016    Past Medical History:  Diagnosis Date  . Anemia    iron deficiency  . Fibroid     Past Surgical History:  Procedure Laterality Date  . EYE SURGERY Bilateral   . TONSILLECTOMY      Current Outpatient Medications  Medication Sig Dispense Refill  . Ascorbic Acid (VITAMIN C) 1000 MG tablet Take 1,000 mg by mouth daily.    . Cholecalciferol (VITAMIN D3) 125 MCG (5000 UT) CAPS Take 1 capsule by mouth daily.    . hydrOXYzine (VISTARIL) 25 MG capsule Take 25 mg by mouth 2 (two) times daily.    . mirtazapine (REMERON) 15 MG tablet Take 1 tab at night    . sulfamethoxazole-trimethoprim (BACTRIM DS) 800-160 MG tablet Take 1 tablet by mouth 2 (two) times daily. One PO BID x 3 days 6 tablet 0  . tretinoin (RETIN-A) 0.025 % cream Apply 1 application topically at bedtime.     No current facility-administered medications for this visit.     ALLERGIES: Patient has no known  allergies.  Family History  Problem Relation Age of Onset  . Hypertension Mother   . Cancer Father   . Diabetes Father   . Diabetes Brother     Social History   Socioeconomic History  . Marital status: Widowed    Spouse name: Not on file  . Number of children: Not on file  . Years of education: Not on file  . Highest education level: Not on file  Occupational History  . Occupation: Scientist, water quality    Comment: Runner, broadcasting/film/video  Tobacco Use  . Smoking status: Former Smoker    Packs/day: 0.25    Years: 8.00    Pack years: 2.00    Types: Cigarettes    Quit date: 10/10/2012    Years since quitting: 7.4  . Smokeless tobacco: Never Used  Substance and Sexual Activity  . Alcohol use: No    Alcohol/week: 0.0 standard drinks  . Drug use: No  . Sexual activity: Not Currently    Birth control/protection: None  Other Topics Concern  . Not on file  Social History Narrative  Widowed Feb 2017. Husband died from [place] disease.    Social Determinants of Health   Financial Resource Strain:   . Difficulty of Paying Living Expenses:   Food Insecurity:   . Worried About Charity fundraiser in the Last Year:   . Arboriculturist in the Last Year:   Transportation Needs:   . Film/video editor (Medical):   Marland Kitchen Lack of Transportation (Non-Medical):   Physical Activity:   . Days of Exercise per Week:   . Minutes of Exercise per Session:   Stress:   . Feeling of Stress :   Social Connections:   . Frequency of Communication with Friends and Family:   . Frequency of Social Gatherings with Friends and Family:   . Attends Religious Services:   . Active Member of Clubs or Organizations:   . Attends Archivist Meetings:   Marland Kitchen Marital Status:   Intimate Partner Violence:   . Fear of Current or Ex-Partner:   . Emotionally Abused:   Marland Kitchen Physically Abused:   . Sexually Abused:     Review of Systems  See HPI.  PHYSICAL EXAMINATION:    BP 100/66   Pulse 60   Temp (!)  97.3 F (36.3 C) (Temporal)   Ht 5\' 5"  (1.651 m)   Wt 128 lb 9.6 oz (58.3 kg)   LMP 02/26/2020 (Exact Date)   BMI 21.40 kg/m     General appearance: alert, cooperative and appears stated age   Pelvic US  Uterus with 2 fibroids - 59 x 57 mm submucosal and 32 x 13 subserosal. EMS 4.13 mm.  Ovaries normal.  Adnexa without masses. No free fluid.   ASSESSMENT  Uterine fibroids.  Postmenopausal bleeding.  I suspect this is likely due to atrophy.  Dysuria.   PLAN  Ultrasound findings and images reviewed with patient.  In addition to her US findings of fibroids, we discussed reasons for dysuria and vaginal bleeding.  No EMB needed today.  I recommend returning to complete STD testing.    An After Visit Summary was printed and given to the patient.  __15____ minutes face to face time of which over 50% was spent in counseling.

## 2020-03-10 ENCOUNTER — Ambulatory Visit: Payer: BC Managed Care – PPO | Admitting: Obstetrics and Gynecology

## 2020-03-10 ENCOUNTER — Telehealth: Payer: Self-pay

## 2020-03-10 NOTE — Telephone Encounter (Signed)
Patient cancelled appointment for today (03/10/20) due to family emergency. Patient will call back to reschedule.

## 2020-03-10 NOTE — Progress Notes (Deleted)
GYNECOLOGY  VISIT   HPI: 49 y.o.   Widowed [place]   [id number] with Patient's last menstrual period was 02/26/2020 (exact date).   here for follow up.   GYNECOLOGIC HISTORY: Patient's last menstrual period was 02/26/2020 (exact date). Contraception: Abstinence Menopausal hormone therapy:  None Last mammogram: 03-05-20 Neg/density D/BiRads1 Last pap smear: 11/17/17 Neg:Neg HR HPV        OB History    Gravida  5   Para  5   Term  5   Preterm      AB      Living  5     SAB      TAB      Ectopic      Multiple  1   Live Births                 Patient Active Problem List   Diagnosis Date Noted  . Uterine leiomyoma 11/30/2017  . Symptomatic anemia 08/02/2016    Past Medical History:  Diagnosis Date  . Anemia    iron deficiency  . Fibroid     Past Surgical History:  Procedure Laterality Date  . EYE SURGERY Bilateral   . TONSILLECTOMY      Current Outpatient Medications  Medication Sig Dispense Refill  . Ascorbic Acid (VITAMIN C) 1000 MG tablet Take 1,000 mg by mouth daily.    . Cholecalciferol (VITAMIN D3) 125 MCG (5000 UT) CAPS Take 1 capsule by mouth daily.    . hydrOXYzine (VISTARIL) 25 MG capsule Take 25 mg by mouth 2 (two) times daily.    . mirtazapine (REMERON) 15 MG tablet Take 1 tab at night    . sulfamethoxazole-trimethoprim (BACTRIM DS) 800-160 MG tablet Take 1 tablet by mouth 2 (two) times daily. One PO BID x 3 days 6 tablet 0  . tretinoin (RETIN-A) 0.025 % cream Apply 1 application topically at bedtime.     No current facility-administered medications for this visit.     ALLERGIES: Patient has no known allergies.  Family History  Problem Relation Age of Onset  . Hypertension Mother   . Cancer Father   . Diabetes Father   . Diabetes Brother     Social History   Socioeconomic History  . Marital status: Widowed    Spouse name: Not on file  . Number of children: Not on file  . Years of education: Not on file  . Highest  education level: Not on file  Occupational History  . Occupation: Scientist, water quality    Comment: Runner, broadcasting/film/video  Tobacco Use  . Smoking status: Former Smoker    Packs/day: 0.25    Years: 8.00    Pack years: 2.00    Types: Cigarettes    Quit date: 10/10/2012    Years since quitting: 7.4  . Smokeless tobacco: Never Used  Substance and Sexual Activity  . Alcohol use: No    Alcohol/week: 0.0 standard drinks  . Drug use: No  . Sexual activity: Not Currently    Birth control/protection: None  Other Topics Concern  . Not on file  Social History Narrative   Widowed 12/09/2015. Husband died from [place] disease.    Social Determinants of Health   Financial Resource Strain:   . Difficulty of Paying Living Expenses:   Food Insecurity:   . Worried About Charity fundraiser in the Last Year:   . Arboriculturist in the Last Year:   Transportation Needs:   .  Lack of Transportation (Medical):   Marland Kitchen Lack of Transportation (Non-Medical):   Physical Activity:   . Days of Exercise per Week:   . Minutes of Exercise per Session:   Stress:   . Feeling of Stress :   Social Connections:   . Frequency of Communication with Friends and Family:   . Frequency of Social Gatherings with Friends and Family:   . Attends Religious Services:   . Active Member of Clubs or Organizations:   . Attends Archivist Meetings:   Marland Kitchen Marital Status:   Intimate Partner Violence:   . Fear of Current or Ex-Partner:   . Emotionally Abused:   Marland Kitchen Physically Abused:   . Sexually Abused:     Review of Systems  PHYSICAL EXAMINATION:    LMP 02/26/2020 (Exact Date)     General appearance: alert, cooperative and appears stated age Head: Normocephalic, without obvious abnormality, atraumatic Neck: no adenopathy, supple, symmetrical, trachea midline and thyroid normal to inspection and palpation Lungs: clear to auscultation bilaterally Breasts: normal appearance, no masses or tenderness, No nipple retraction  or dimpling, No nipple discharge or bleeding, No axillary or supraclavicular adenopathy Heart: regular rate and rhythm Abdomen: soft, non-tender, no masses,  no organomegaly Extremities: extremities normal, atraumatic, no cyanosis or edema Skin: Skin color, texture, turgor normal. No rashes or lesions Lymph nodes: Cervical, supraclavicular, and axillary nodes normal. No abnormal inguinal nodes palpated Neurologic: Grossly normal  Pelvic: External genitalia:  no lesions              Urethra:  normal appearing urethra with no masses, tenderness or lesions              Bartholins and Skenes: normal                 Vagina: normal appearing vagina with normal color and discharge, no lesions              Cervix: no lesions                Bimanual Exam:  Uterus:  normal size, contour, position, consistency, mobility, non-tender              Adnexa: no mass, fullness, tenderness              Rectal exam: {yes no:[id number]}.  Confirms.              Anus:  normal sphincter tone, no lesions  Chaperone was present for exam.  ASSESSMENT     PLAN     An After Visit Summary was printed and given to the patient.  ______ minutes face to face time of which over 50% was spent in counseling.

## 2020-03-11 NOTE — Telephone Encounter (Signed)
Encounter reviewed and closed.  Patient choice about rescheduling.

## 2020-03-12 ENCOUNTER — Ambulatory Visit: Payer: BC Managed Care – PPO | Admitting: Obstetrics and Gynecology

## 2020-06-17 ENCOUNTER — Ambulatory Visit
Admission: RE | Admit: 2020-06-17 | Discharge: 2020-06-17 | Disposition: A | Discharge status: Home / Self Care | Payer: BC Managed Care – PPO | Source: Ambulatory Visit | Attending: Family Medicine | Admitting: Family Medicine

## 2020-06-17 ENCOUNTER — Other Ambulatory Visit: Payer: Self-pay | Admitting: Family Medicine

## 2020-06-17 DIAGNOSIS — R52 Pain, unspecified: Secondary | ICD-10-CM

## 2020-06-17 IMAGING — CR DG CERVICAL SPINE COMPLETE 4+V
6 series · 6 of 6 positions shown · non-contrast
Comparison: [DATE]

CLINICAL DATA: Neck pain, no known injury

EXAM:
CERVICAL SPINE - COMPLETE 4+ VIEW

[w c-spine lat]
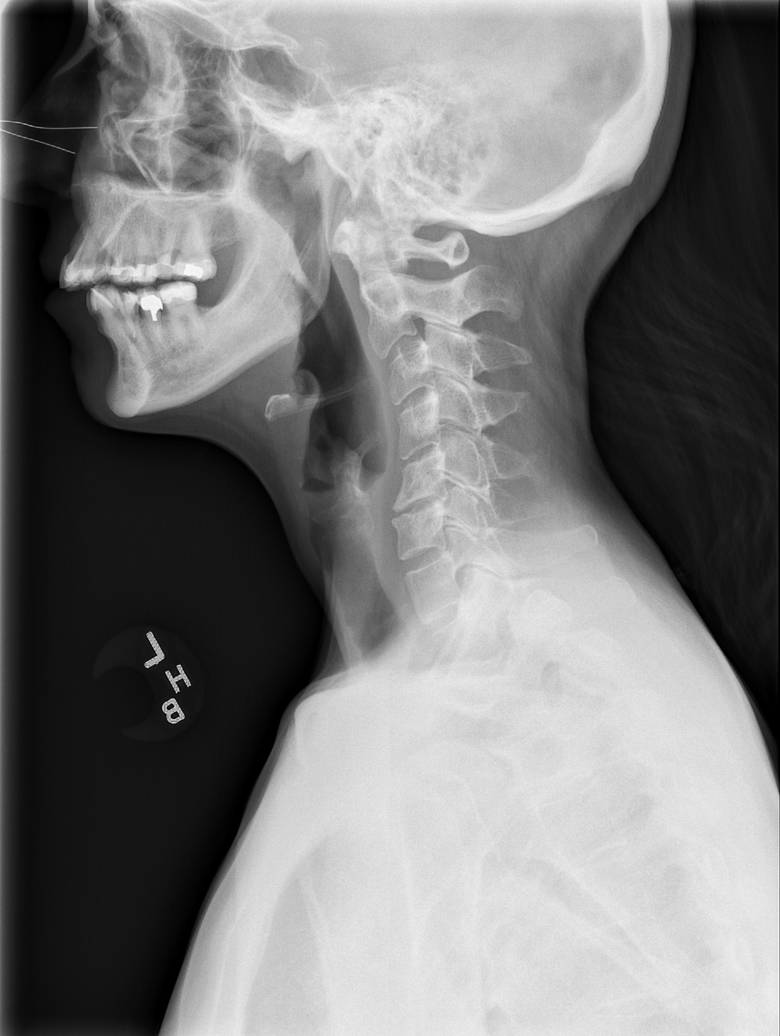

[w c-spine oblique (1 of 2)]
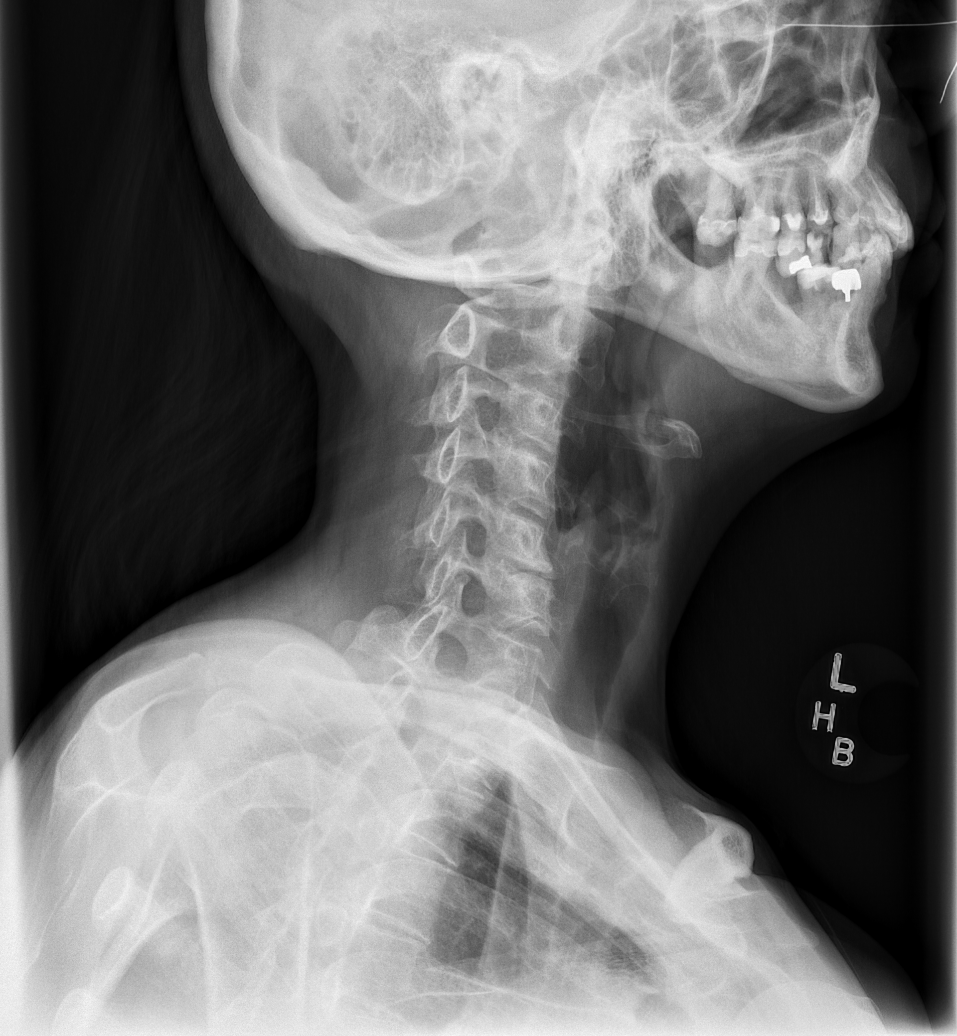

[w c-spine oblique (2 of 2)]
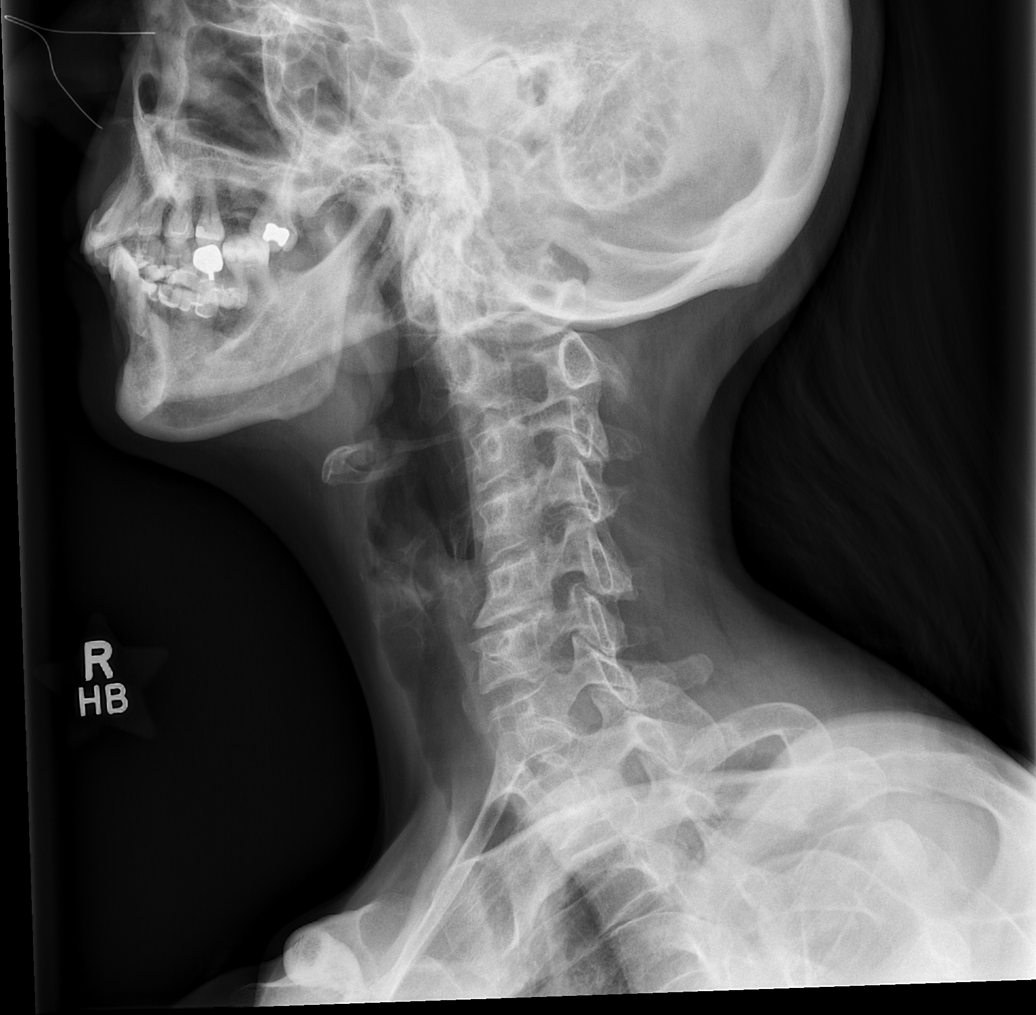

[w c-spine a.p. *]
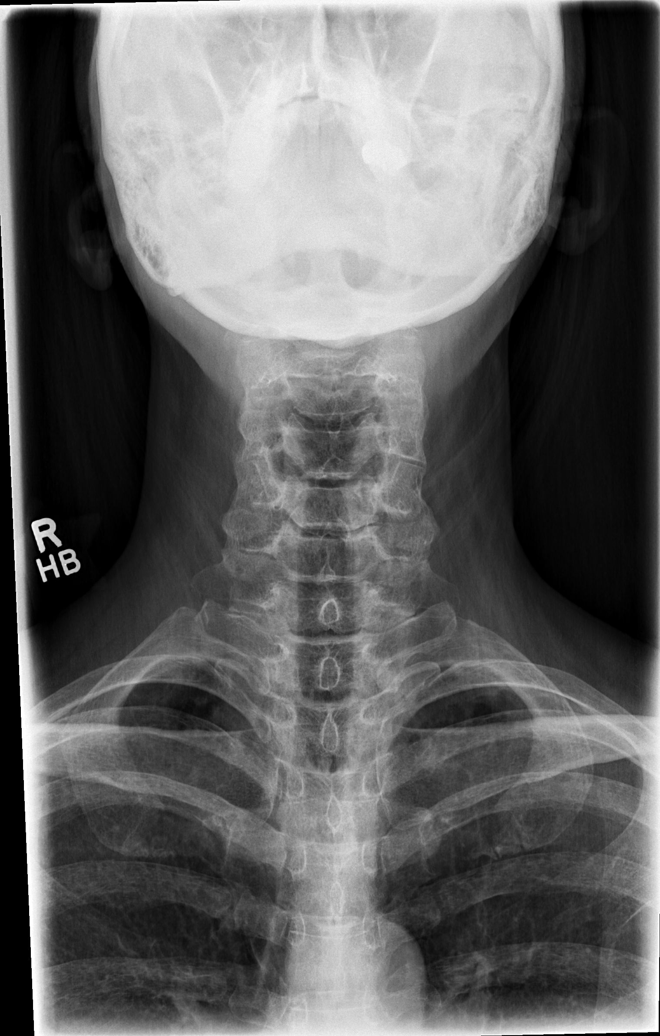

[w c-spine odontoid * (1 of 2)]
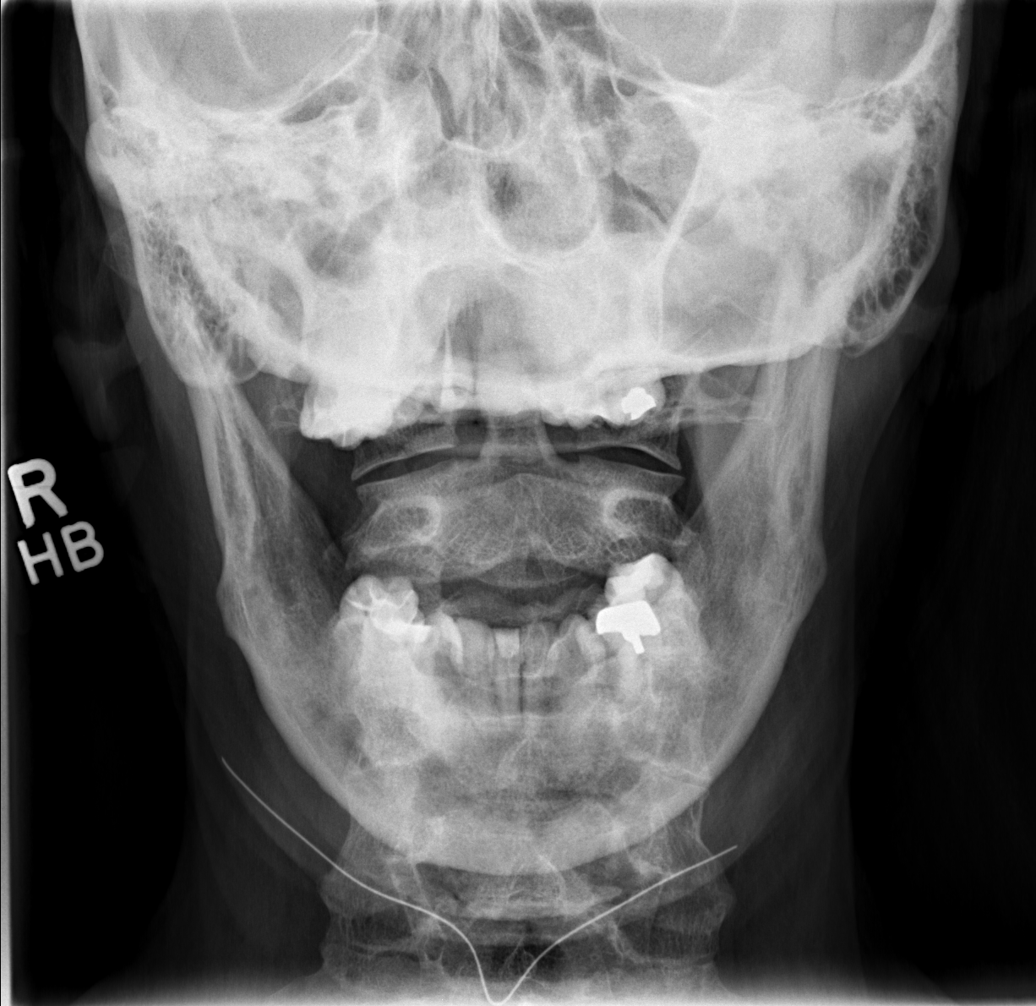

[w c-spine odontoid * (2 of 2)]
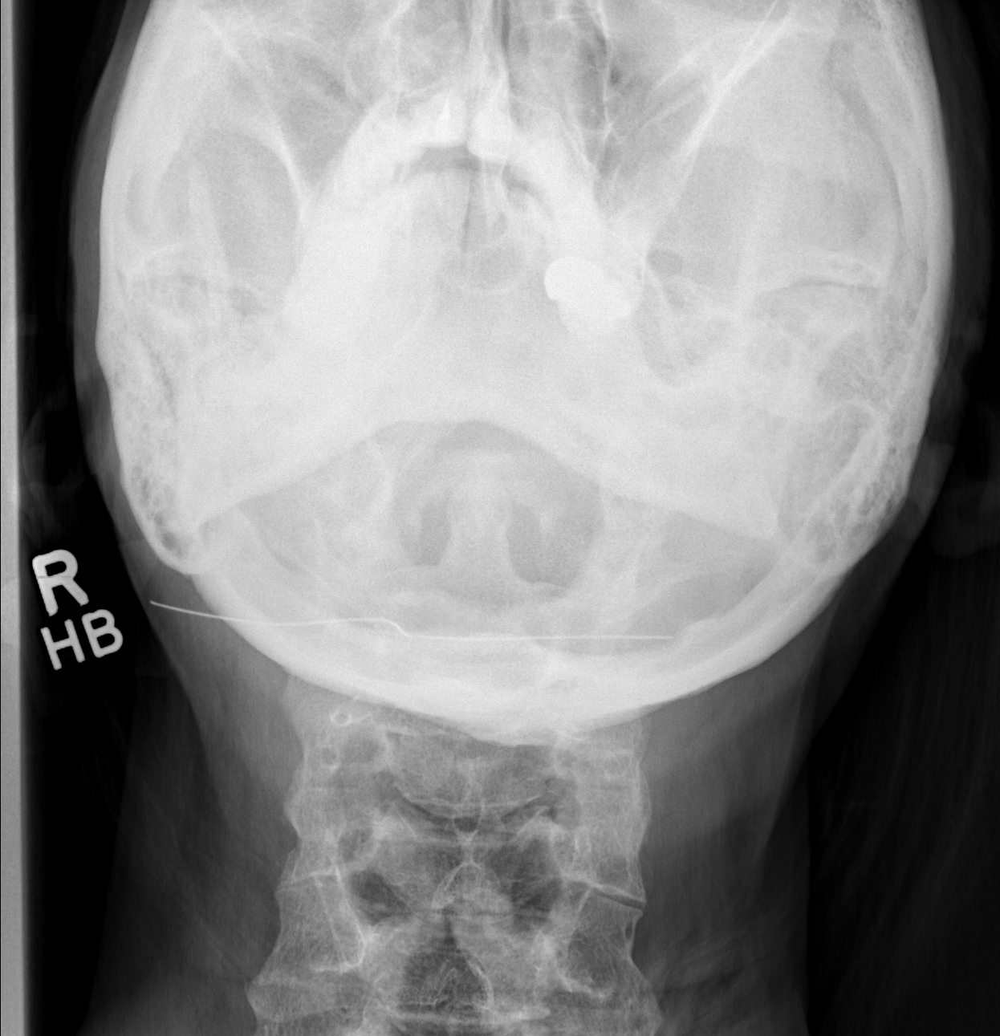

[6 of 6 positions shown; findings below may reference images not displayed]

FINDINGS: No fracture or static subluxation of the cervical spine.
Degenerative straightening of the normal cervical lordosis. Focally
moderate disc space height loss and osteophytosis of C6-C7 with
otherwise preserved disc spaces. There is no significant bony neural
foraminal stenosis. Degenerative findings are not significant
changed compared to prior examination dated [T8]. The partially
imaged skull base, cervical soft tissues, and upper chest are
unremarkable.
IMPRESSION: No fracture or static subluxation of the cervical spine.
Degenerative straightening of the normal cervical lordosis. Focally
moderate disc space height loss and osteophytosis of C6-C7 with
otherwise preserved disc spaces. No significant bony neural
foraminal stenosis. Cervical disc and neural foraminal pathology may
be further evaluated by MRI if indicated by neurologically
localizing signs and symptoms.

## 2020-06-17 IMAGING — CR DG LUMBAR SPINE COMPLETE 4+V
5 series · 5 of 5 positions shown · non-contrast
Comparison: CT stone [DATE].

CLINICAL DATA: Neck pain, posterior cervical, thoracic, lumbar pain
for months no injury.

EXAM:
LUMBAR SPINE - COMPLETE 4+ VIEW

[t l-spine a.p.]
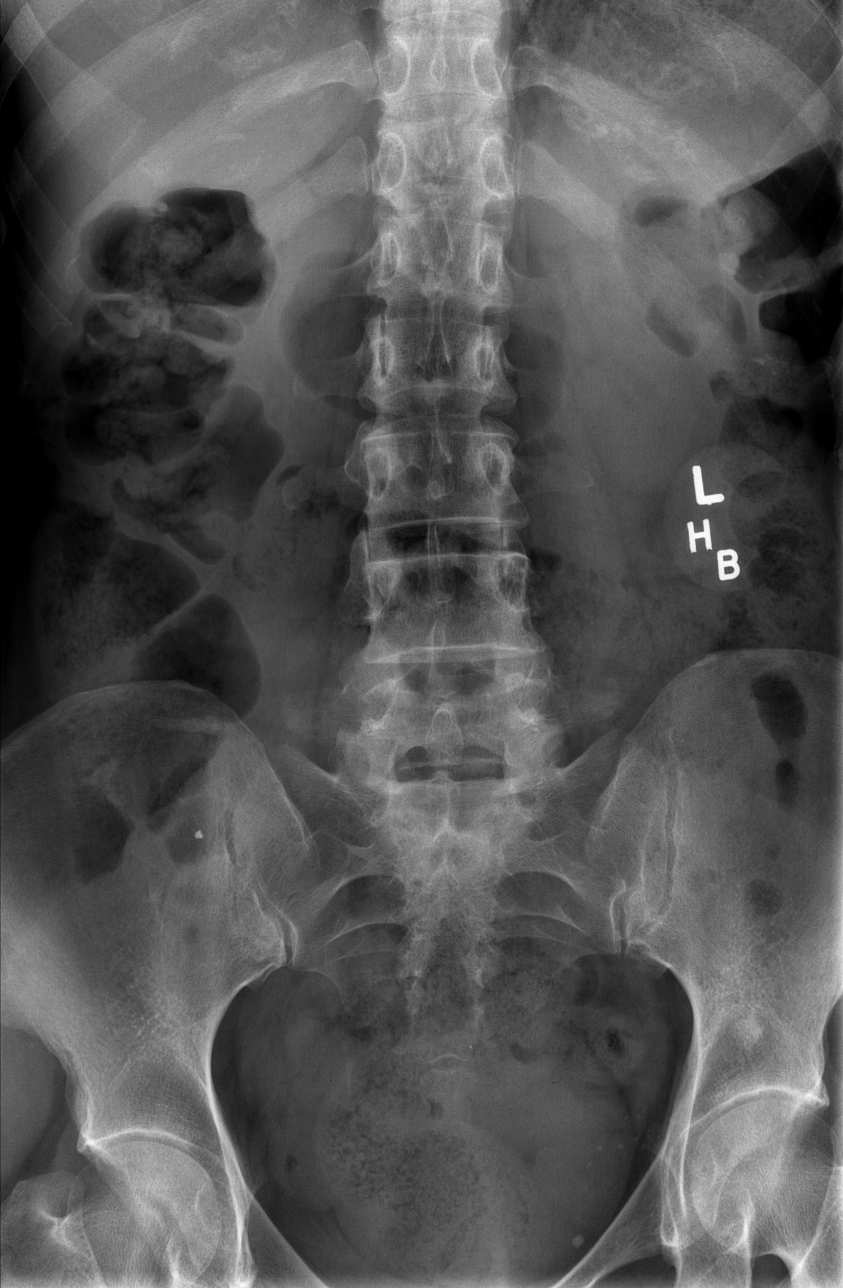

[t l-spine oblique exposure (1 of 2)]
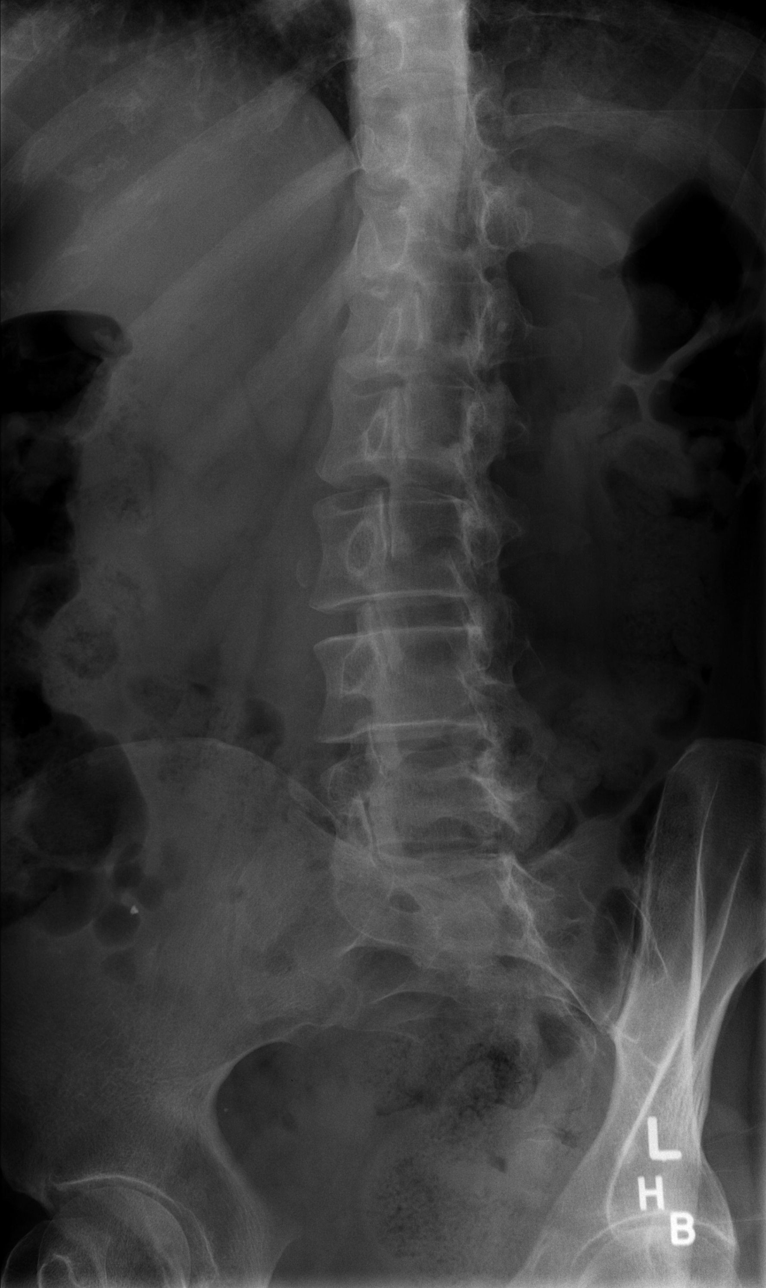

[t l-spine oblique exposure (2 of 2)]
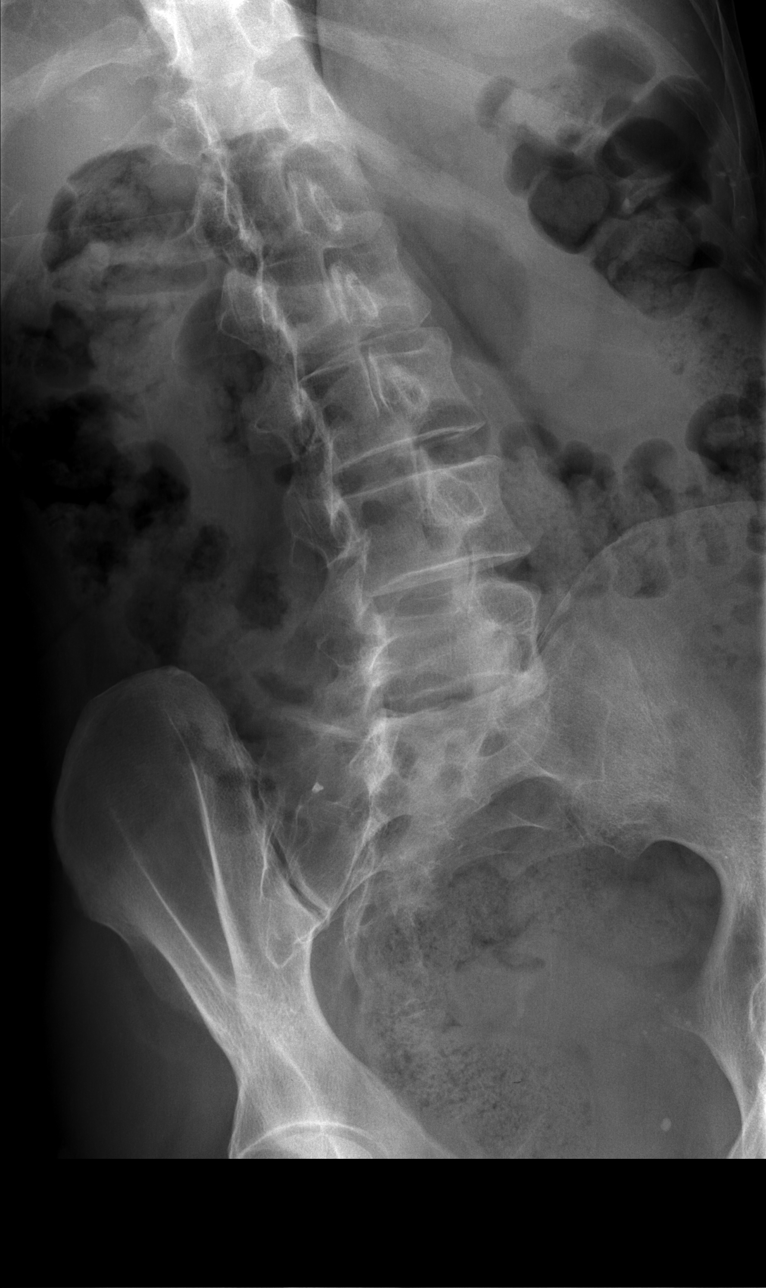

[t l-spine lat]
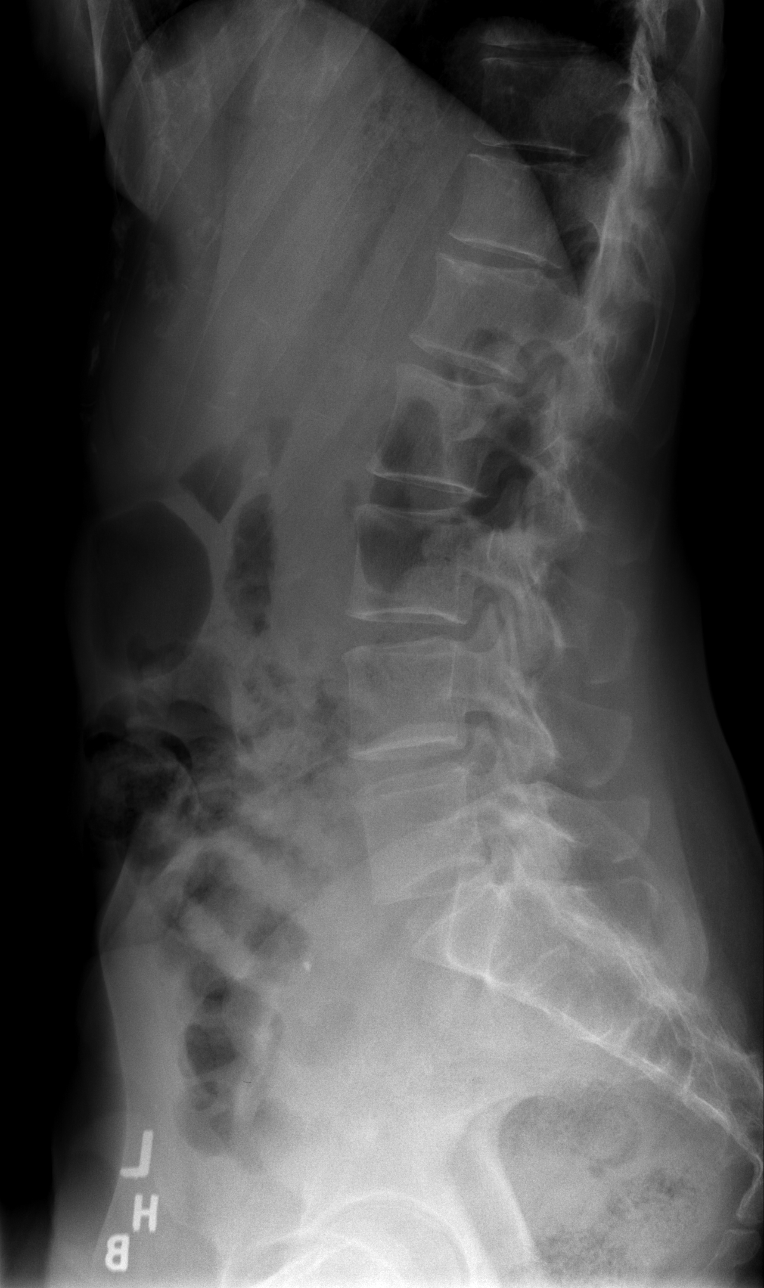

[t l-spine l5-s1 spot]
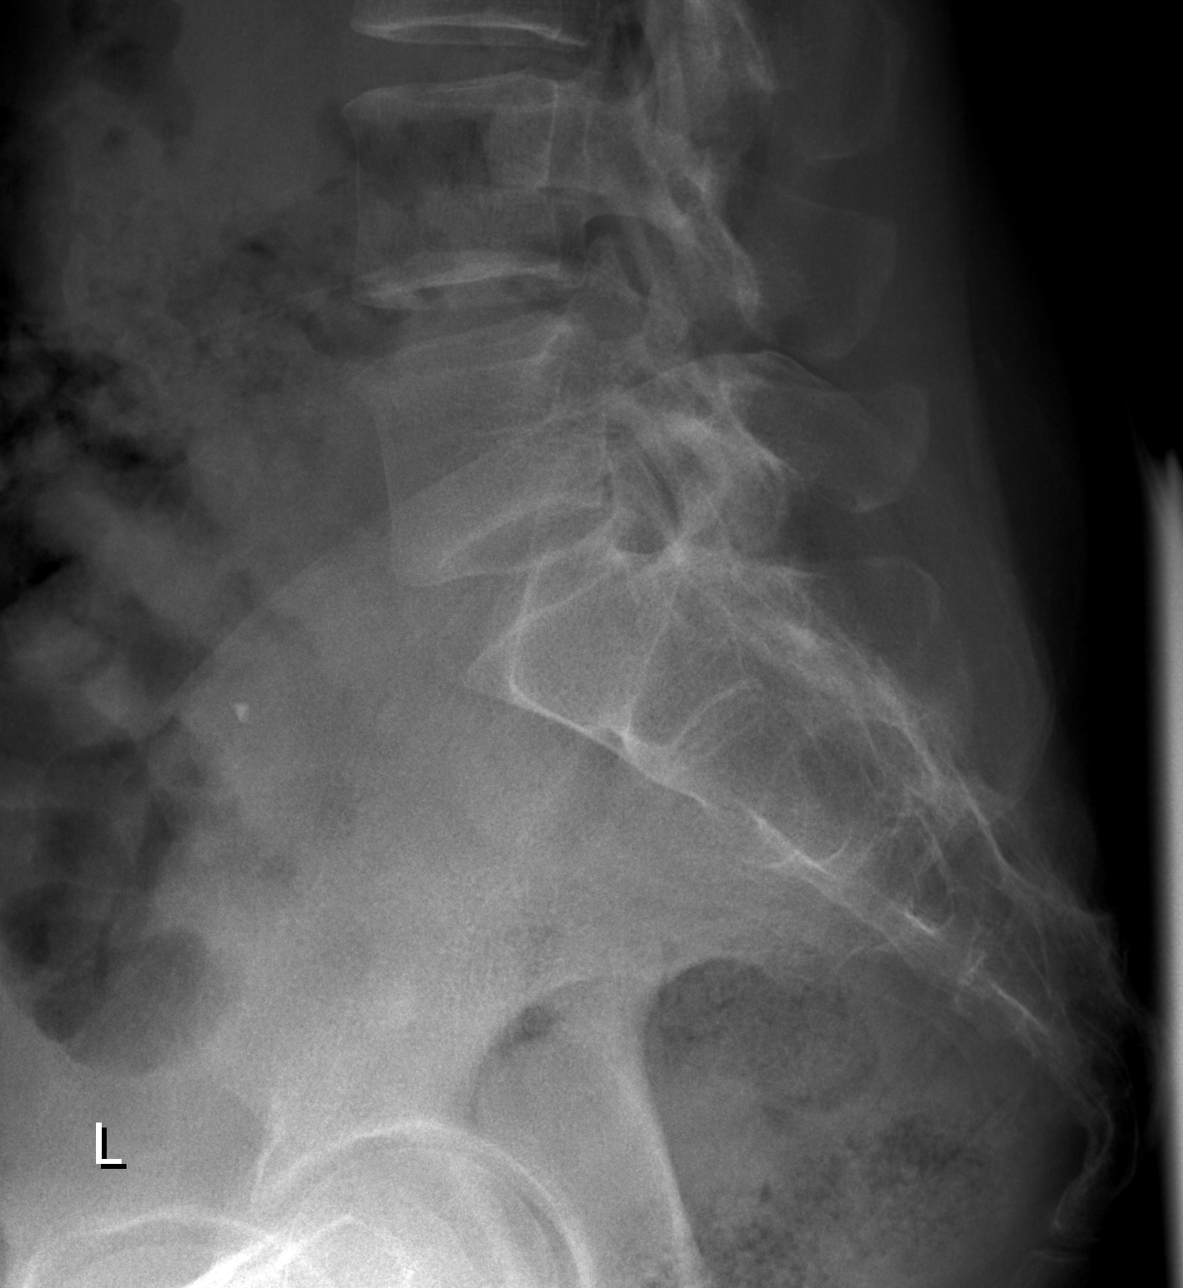

[5 of 5 positions shown; findings below may reference images not displayed]

FINDINGS: Five non rib bearing lumbar vertebral bodies. There is no evidence
of lumbar spine fracture. No findings to suggest pars
interarticularis defects. No severe degenerative changes. Alignment
is normal. Intervertebral disc spaces are maintained.

3 mm metallic density overlying the right lower abdomen of unclear
etiology. Stool throughout the colon.
IMPRESSION: No acute fracture, traumatic listhesis, or severe degenerative
changes of the lumbar spine.

## 2020-06-17 IMAGING — CR DG THORACIC SPINE 3V
2 series · 2 of 2 positions shown · non-contrast
Comparison: None.

CLINICAL DATA: Chronic lower back pain without known injury.

EXAM:
THORACIC SPINE - 3 VIEWS

[t t-spine a.p.]
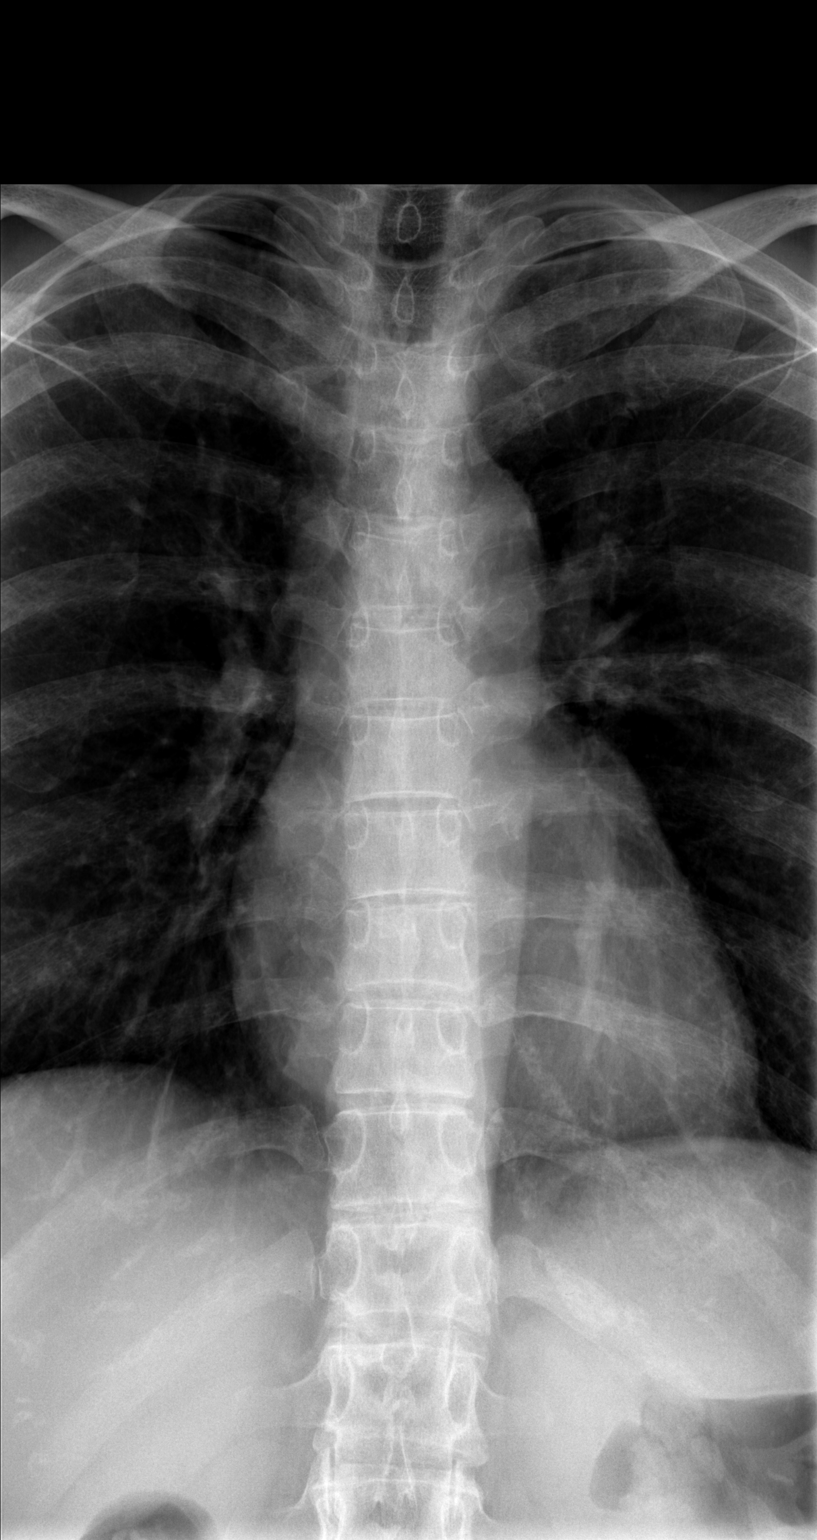

[t t-spine lat]
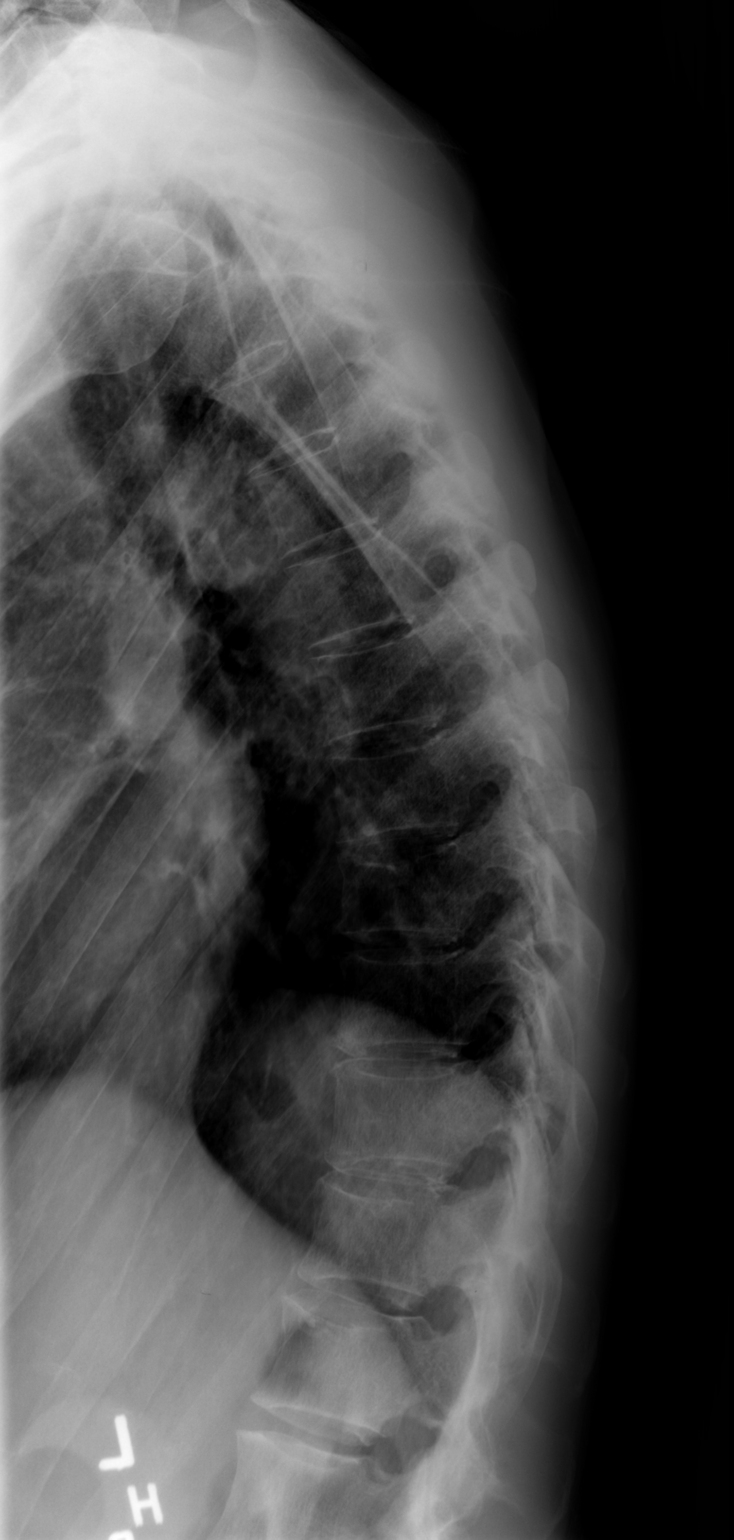

[2 of 2 positions shown; findings below may reference images not displayed]

FINDINGS: There is no evidence of thoracic spine fracture. Alignment is
normal. No other significant bone abnormalities are identified.
IMPRESSION: Negative.

## 2020-09-01 ENCOUNTER — Telehealth: Payer: Self-pay

## 2020-09-01 NOTE — Telephone Encounter (Signed)
Patient having burning and irritation.

## 2020-09-01 NOTE — Telephone Encounter (Signed)
Left message for pt to return call to triage RN. 

## 2020-09-02 NOTE — Telephone Encounter (Signed)
AEX 11/2017 with BS Last OV 02/2020 with BS  Hx + BV 2019, neg in 02/2020  Spoke with pt. Pt states having vaginal irritation, itching  and burning, with intermittent discharge and odor x 1 week. Pt states has not changed soaps, detergents. Pt is not SA at this time. Denies pain, abd cramps, denies all UTI sx. Pt states did use Monistat 7 and sx have not resolved.   Pt advised to have OV for further evaluation. Pt agreeable. Pt advised no available appts for today and encouraged to be seen at [facility] or PCP over holiday weekend. Pt declines and will wait to be seen on Monday at our office. Pt agreeable to see Claiborne Billings, NP for OV. Pt scheduled on 11/29 at 3 pm with Kelly,NP. Pt verbalized understanding to date and time of appt. Thankful for call back and OV.  Routing to [employer] NP for review Encounter closed

## 2020-09-07 ENCOUNTER — Other Ambulatory Visit: Payer: Self-pay

## 2020-09-07 ENCOUNTER — Ambulatory Visit (INDEPENDENT_AMBULATORY_CARE_PROVIDER_SITE_OTHER): Payer: 59 | Admitting: Nurse Practitioner

## 2020-09-07 ENCOUNTER — Encounter: Payer: Self-pay | Admitting: Nurse Practitioner

## 2020-09-07 VITALS — BP 108/70 | HR 68 | Resp 16 | Wt 136.0 lb

## 2020-09-07 DIAGNOSIS — R159 Full incontinence of feces: Secondary | ICD-10-CM

## 2020-09-07 DIAGNOSIS — R8290 Unspecified abnormal findings in urine: Secondary | ICD-10-CM | POA: Diagnosis not present

## 2020-09-07 DIAGNOSIS — N760 Acute vaginitis: Secondary | ICD-10-CM

## 2020-09-07 DIAGNOSIS — N898 Other specified noninflammatory disorders of vagina: Secondary | ICD-10-CM

## 2020-09-07 DIAGNOSIS — B9689 Other specified bacterial agents as the cause of diseases classified elsewhere: Secondary | ICD-10-CM

## 2020-09-07 LAB — POCT URINALYSIS DIPSTICK
Bilirubin, UA: NEGATIVE
Glucose, UA: NEGATIVE
Ketones, UA: NEGATIVE
Leukocytes, UA: NEGATIVE
Nitrite, UA: NEGATIVE
Protein, UA: NEGATIVE
Urobilinogen, UA: NEGATIVE E.U./dL — AB
pH, UA: 8 (ref 5.0–8.0)

## 2020-09-07 MED ORDER — METRONIDAZOLE 500 MG PO TABS: 500.0000 mg | ORAL_TABLET | Freq: Two times a day (BID) | ORAL | 0 refills | Status: DC

## 2020-09-07 NOTE — Patient Instructions (Signed)

## 2020-09-07 NOTE — Progress Notes (Signed)
GYNECOLOGY  VISIT  CC:   Vaginal Irritation  HPI: 49 y.o. [id number] Widowed Other or two or more races female here for vaginal burning & itching, it has been going on x 2 weeks. Used monistat 7 day last week and did not help. Feels swollen. Last intercourse several years ago. Noticed odor to urine, Feels like she has to go to use toilet and only drops come out.  Feels like she has odor coming from "stomach" Feels like burping a lot. Does not feel like it is her breath, feels like odor from stomach. Goes to dentist every 6 months, they did not say anything. She has tried tums and other OTC medications. Reports fecal incontinence. Sometimes stool comes out of her and she can not control it. She was suppose to go to to GI doctor in May 2021 (Dr. Quincy Simmonds referred) but she could not afford. She has better insurance now but only until the end of the year. Wants to know if she can be referred to GI again.     GYNECOLOGIC HISTORY: Patient's last menstrual period was 02/26/2020 (exact date). Contraception: post menopausal Menopausal hormone therapy: none  Patient Active Problem List   Diagnosis Date Noted  . Uterine leiomyoma 11/30/2017  . Symptomatic anemia 08/02/2016    Past Medical History:  Diagnosis Date  . Anemia    iron deficiency  . Fibroid     Past Surgical History:  Procedure Laterality Date  . EYE SURGERY Bilateral   . TONSILLECTOMY      MEDS:   Current Outpatient Medications on File Prior to Visit  Medication Sig Dispense Refill  . escitalopram (LEXAPRO) 10 MG tablet Take 10 mg by mouth daily.    Marland Kitchen FLUoxetine (PROZAC) 10 MG capsule     . meloxicam (MOBIC) 7.5 MG tablet meloxicam 7.5 mg tablet    . predniSONE (STERAPRED UNI-PAK 21 TAB) 5 MG (21) TBPK tablet Taper (6,5,4,3,2,1)    . QUEtiapine (SEROQUEL) 50 MG tablet     . traZODone (DESYREL) 100 MG tablet      No current facility-administered medications on file prior to visit.    ALLERGIES: Patient has no known  allergies.  Family History  Problem Relation Age of Onset  . Hypertension Mother   . Cancer Father   . Diabetes Father   . Diabetes Brother     Review of Systems  Constitutional: Negative.   HENT: Negative.   Eyes: Negative.   Respiratory: Negative.   Cardiovascular: Negative.   Gastrointestinal: Negative.   Endocrine: Negative.   Genitourinary:       Vaginal itching & burning  Musculoskeletal: Negative.   Skin: Negative.   Allergic/Immunologic: Negative.   Neurological: Negative.   Hematological: Negative.   Psychiatric/Behavioral: Negative.     PHYSICAL EXAMINATION:    BP 108/70   Pulse 68   Resp 16   Wt 136 lb (61.7 kg)   LMP 02/26/2020 (Exact Date)   BMI 22.63 kg/m      General appearance: alert, cooperative and appears stated age  Lymph:  no inguinal LAD noted  Pelvic: External genitalia:  no lesions              Urethra:  normal appearing urethra with no masses, tenderness or lesions              Bartholins and Skenes: normal                 Vagina: scant white discharge, mild erythema  Cervix: no lesions               Wet mount: + clue cells, ph 5.0, 5-8 WBC per field, neg yeast (pt used Monistat x 7 days last week, bubbles noted on slide most likely remnant medication), will send affirm to confirm diagnosis.          Chaperone, Joy, CMA, was present for exam.  Assessment: BV Pt desires to reinitiate referral to GI: fecal incontinence and "Stomach odor" (sour stomach). Wants referral ASAP r/t insurance running out end of 2021  Plan: Metronidazole 500mg  BID x 7 days Wet mount done, suboptimal r/t medication in vagina Affirm sent to lab F/u next month for annual exam

## 2020-09-07 NOTE — Addendum Note (Signed)
Addended by: Donivan Scull on: 09/07/2020 05:30 PM   Modules accepted: Orders

## 2020-09-08 LAB — VAGINITIS/VAGINOSIS, DNA PROBE
Candida Species: NEGATIVE
Gardnerella vaginalis: POSITIVE — AB
Trichomonas vaginosis: NEGATIVE

## 2020-09-14 ENCOUNTER — Other Ambulatory Visit: Payer: Self-pay | Admitting: Nurse Practitioner

## 2020-09-14 ENCOUNTER — Telehealth: Payer: Self-pay

## 2020-09-14 DIAGNOSIS — N9089 Other specified noninflammatory disorders of vulva and perineum: Secondary | ICD-10-CM

## 2020-09-14 MED ORDER — NYSTATIN-TRIAMCINOLONE 100000-0.1 UNIT/GM-% EX CREA: 1.0000 "application " | TOPICAL_CREAM | Freq: Two times a day (BID) | CUTANEOUS | 0 refills | Status: DC

## 2020-09-14 NOTE — Telephone Encounter (Signed)
Patient is having the same burning and itching symptoms as before.

## 2020-09-14 NOTE — Telephone Encounter (Signed)
Spoke with pt. Pt given recommendations per Claiborne Billings, NP. Pt declines to use OTC Monistat 7 at this time and would just like Mycolog II cream.  Advised will give update to Claiborne Billings, NP Pt agreeable.   Routing to [place], NP. Pt's pharmacy verified.

## 2020-09-14 NOTE — Telephone Encounter (Signed)
Spoke with pt. Pt states for past 3 days had vaginal itching and burning. Pt states started after taking Flagyl Rx for +BV on 11/29. Pt requesting to have Mycolog II cream prescribed "since worked last time when Dr Quincy Simmonds prescribed for itching". Denies odor, discharge, abd cramps or VB.   Advised pt to have OV for evaluation. Pt declines and would not be able to come in until next week due to work schedule.  Pt advised will review with Claiborne Billings, NP for Rx mycolog II cream or yeast infection Rx. Pt agreeable.   Routing to [place], NP please advise

## 2020-09-14 NOTE — Telephone Encounter (Signed)
Mycolog II is a combination of triamcinolone (steroid) and nystatin (anti-fungal). It is common to get a yeast infection after treating with any antibiotic. It would be preferred to know what is being treated before treating. Yeast is a common culprit of itching and is frequently accompanied by redness and/or swelling. If you suspect this and want to treat for yeast, you may want to try monistat 7 (generic equivalent is miconazole 7).  If she wants to just use mycolog II, I will send in the prescription, just understand that if there is a problem, it may not be getting to the root cause. If Mycolog is too expensive, I would also be willing to send in two different prescriptions of the ingredients of Mycolog.   I will send in a RX for Mycolog as requested, please notify patient and let me know if the patient has additional questions.

## 2020-09-17 ENCOUNTER — Encounter: Payer: Self-pay | Admitting: Gastroenterology

## 2020-09-23 ENCOUNTER — Encounter: Payer: Self-pay | Admitting: Neurology

## 2020-09-29 NOTE — Progress Notes (Signed)
49 y.o. [id number] Widowed Other or two or more races female here for annual exam.   Pt reported that she fell at work and hurt her left side. Has a doctor she is working with tho help with short term disability  Vaginal irritation is improved, has a little irritation on skin when urinates, vaginal dryness feeling  Has severe hot flashes and night sweats. States she turns on [facility] at night and freezes her children.  Feels like the date entered for LMP is incorrect. She feels like it has been 1 year with out a period. Had EMB biopsy, benign 02/2020  Has an appointment in January for Colonoscopy    Patient's last menstrual period was 02/26/2020 (exact date).          Sexually active: No.  The current method of family planning is post menopausal status.    Exercising: No.  exercise Smoker:  no  Health Maintenance: Pap:  11-17-2017 neg HPV HR neg History of abnormal Pap:  no MMG:  03-05-2020 category d density birads 1:neg Colonoscopy:  none BMD:   none TDaP:  UTD per patient Gardasil:   n/a Covid-19: got 1 pfizer Hep C testing: neg 2019 Screening Labs: Went to PCP 2 weeks ago and had screening labs (feels like it was a [facility] MD, first name Benjamine Mola)   reports that she quit smoking about 7 years ago. Her smoking use included cigarettes. She has a 2.00 pack-year smoking history. She has never used smokeless tobacco. She reports that she does not drink alcohol and does not use drugs.  Past Medical History:  Diagnosis Date  . Anemia    iron deficiency  . Fibroid     Past Surgical History:  Procedure Laterality Date  . EYE SURGERY Bilateral   . TONSILLECTOMY      Current Outpatient Medications  Medication Sig Dispense Refill  . buPROPion (WELLBUTRIN XL) 150 MG 24 hr tablet Take 150 mg by mouth at bedtime.    . meloxicam (MOBIC) 7.5 MG tablet meloxicam 7.5 mg tablet    . omeprazole (PRILOSEC) 40 MG capsule Take 40 mg by mouth daily.    . orphenadrine (NORFLEX) 100 MG tablet  Take by mouth.    . zolpidem (AMBIEN) 5 MG tablet Take 5 mg by mouth at bedtime as needed.    Marland Kitchen escitalopram (LEXAPRO) 10 MG tablet Take 10 mg by mouth daily. (Patient not taking: Reported on 10/05/2020)    . FLUoxetine (PROZAC) 10 MG capsule  (Patient not taking: Reported on 10/05/2020)    . methocarbamol (ROBAXIN) 500 MG tablet Take by mouth. (Patient not taking: Reported on 10/05/2020)    . QUEtiapine (SEROQUEL) 50 MG tablet  (Patient not taking: Reported on 10/05/2020)    . sertraline (ZOLOFT) 50 MG tablet Take 50 mg by mouth daily. (Patient not taking: Reported on 10/05/2020)    . traZODone (DESYREL) 100 MG tablet      No current facility-administered medications for this visit.    Family History  Problem Relation Age of Onset  . Hypertension Mother   . Cancer Father   . Diabetes Father   . Diabetes Brother     Review of Systems  Constitutional:       Hot flashes, night sweats  HENT: Negative.   Eyes: Negative.   Respiratory: Negative.   Cardiovascular: Negative.   Gastrointestinal: Negative.   Endocrine: Negative.   Genitourinary: Negative.   Musculoskeletal:       Joint & bone pain  Skin: Negative.   Allergic/Immunologic: Negative.   Neurological: Negative.   Hematological: Negative.   Psychiatric/Behavioral: Negative.     Exam:   BP 110/72   Pulse 68   Resp 16   Ht 5' 5.25" (1.657 m)   Wt 137 lb (62.1 kg)   LMP 02/26/2020 (Exact Date)   BMI 22.62 kg/m   Height: 5' 5.25" (165.7 cm)  General appearance: alert, cooperative and appears stated age, no acute distress Head: Normocephalic, without obvious abnormality Neck: no adenopathy, thyroid normal to inspection and palpation Lungs: clear to auscultation bilaterally Breasts: normal appearance, no masses or tenderness Heart: regular rate and rhythm Abdomen: soft, non-tender; no masses,  no organomegaly Extremities: extremities normal, no edema Skin: No rashes or lesions Lymph nodes: Cervical,  supraclavicular, and axillary nodes normal. No abnormal inguinal nodes palpated Neurologic: Grossly normal   Pelvic: External genitalia:  no lesions              Urethra:  normal appearing urethra with no masses, tenderness or lesions              Bartholins and Skenes: normal                 Vagina: normal appearing vagina, appropriate for age, yellow, thin appearing discharge, no lesions              Cervix: neg cervical motion tenderness, no visible lesions             Bimanual Exam:   Uterus:  enlarged, 12 weeks size              Adnexa: no mass, fullness, tenderness                 Joy, CMA Chaperone was present for exam.  A:  Well Woman with normal exam  Hot flashes  Vaginal Dryness/vaginal discharge  P:   Pap : due 2024 (co-testing 2019 neg)  Mammogram: due 02/2021  Labs:stated done with PCP 2 weeks ago  Medications: Vivelle dot 0.05, Prometrium 100mg , 1 month supply/12 RF  Pt was able to make appointment with GI, plans colonoscopy in January

## 2020-10-05 ENCOUNTER — Other Ambulatory Visit: Payer: Self-pay

## 2020-10-05 ENCOUNTER — Ambulatory Visit (INDEPENDENT_AMBULATORY_CARE_PROVIDER_SITE_OTHER): Payer: 59 | Admitting: Nurse Practitioner

## 2020-10-05 ENCOUNTER — Encounter: Payer: Self-pay | Admitting: Nurse Practitioner

## 2020-10-05 VITALS — BP 110/72 | HR 68 | Resp 16 | Ht 65.25 in | Wt 137.0 lb

## 2020-10-05 DIAGNOSIS — N951 Menopausal and female climacteric states: Secondary | ICD-10-CM | POA: Diagnosis not present

## 2020-10-05 DIAGNOSIS — Z01419 Encounter for gynecological examination (general) (routine) without abnormal findings: Secondary | ICD-10-CM | POA: Diagnosis not present

## 2020-10-05 DIAGNOSIS — N898 Other specified noninflammatory disorders of vagina: Secondary | ICD-10-CM

## 2020-10-05 MED ORDER — ESTRADIOL 0.05 MG/24HR TD PTTW: 1.0000 | MEDICATED_PATCH | TRANSDERMAL | 12 refills | Status: DC

## 2020-10-05 MED ORDER — PROGESTERONE MICRONIZED 100 MG PO CAPS
100.0000 mg | ORAL_CAPSULE | Freq: Every day | ORAL | 12 refills | Status: DC
Start: 2020-10-05 — End: 2021-05-13

## 2020-10-05 NOTE — Patient Instructions (Signed)
Health Maintenance for Postmenopausal Women Menopause is a normal process in which your ability to get pregnant comes to an end. This process happens slowly over many months or years, usually between the ages of 48 and 55. Menopause is complete when you have missed your menstrual periods for 12 months. It is important to talk with your health care provider about some of the most common conditions that affect women after menopause (postmenopausal women). These include heart disease, cancer, and bone loss (osteoporosis). Adopting a healthy lifestyle and getting preventive care can help to promote your health and wellness. The actions you take can also lower your chances of developing some of these common conditions. What should I know about menopause? During menopause, you may get a number of symptoms, such as:  Hot flashes. These can be moderate or severe.  Night sweats.  Decrease in sex drive.  Mood swings.  Headaches.  Tiredness.  Irritability.  Memory problems.  Insomnia. Choosing to treat or not to treat these symptoms is a decision that you make with your health care provider. Do I need hormone replacement therapy?  Hormone replacement therapy is effective in treating symptoms that are caused by menopause, such as hot flashes and night sweats.  Hormone replacement carries certain risks, especially as you become older. If you are thinking about using estrogen or estrogen with progestin, discuss the benefits and risks with your health care provider. What is my risk for heart disease and stroke? The risk of heart disease, heart attack, and stroke increases as you age. One of the causes may be a change in the body's hormones during menopause. This can affect how your body uses dietary fats, triglycerides, and cholesterol. Heart attack and stroke are medical emergencies. There are many things that you can do to help prevent heart disease and stroke. Watch your blood pressure  High  blood pressure causes heart disease and increases the risk of stroke. This is more likely to develop in people who have high blood pressure readings, are of African descent, or are overweight.  Have your blood pressure checked: ? Every 3-5 years if you are 18-39 years of age. ? Every year if you are 40 years old or older. Eat a healthy diet   Eat a diet that includes plenty of vegetables, fruits, low-fat dairy products, and lean protein.  Do not eat a lot of foods that are high in solid fats, added sugars, or sodium. Get regular exercise Get regular exercise. This is one of the most important things you can do for your health. Most adults should:  Try to exercise for at least 150 minutes each week. The exercise should increase your heart rate and make you sweat (moderate-intensity exercise).  Try to do strengthening exercises at least twice each week. Do these in addition to the moderate-intensity exercise.  Spend less time sitting. Even light physical activity can be beneficial. Other tips  Work with your health care provider to achieve or maintain a healthy weight.  Do not use any products that contain nicotine or tobacco, such as cigarettes, e-cigarettes, and chewing tobacco. If you need help quitting, ask your health care provider.  Know your numbers. Ask your health care provider to check your cholesterol and your blood sugar (glucose). Continue to have your blood tested as directed by your health care provider. Do I need screening for cancer? Depending on your health history and family history, you may need to have cancer screening at different stages of your life. This   may include screening for:  Breast cancer.  Cervical cancer.  Lung cancer.  Colorectal cancer. What is my risk for osteoporosis? After menopause, you may be at increased risk for osteoporosis. Osteoporosis is a condition in which bone destruction happens more quickly than new bone creation. To help prevent  osteoporosis or the bone fractures that can happen because of osteoporosis, you may take the following actions:  If you are 38-50 years old, get at least 1,000 mg of calcium and at least 600 mg of vitamin D per day.  If you are older than age 74 but younger than age 65, get at least 1,200 mg of calcium and at least 600 mg of vitamin D per day.  If you are older than age 4, get at least 1,200 mg of calcium and at least 800 mg of vitamin D per day. Smoking and drinking excessive alcohol increase the risk of osteoporosis. Eat foods that are rich in calcium and vitamin D, and do weight-bearing exercises several times each week as directed by your health care provider. How does menopause affect my mental health? Depression may occur at any age, but it is more common as you become older. Common symptoms of depression include:  Low or sad mood.  Changes in sleep patterns.  Changes in appetite or eating patterns.  Feeling an overall lack of motivation or enjoyment of activities that you previously enjoyed.  Frequent crying spells. Talk with your health care provider if you think that you are experiencing depression. General instructions See your health care provider for regular wellness exams and vaccines. This may include:  Scheduling regular health, dental, and eye exams.  Getting and maintaining your vaccines. These include: ? Influenza vaccine. Get this vaccine each year before the flu season begins. ? Pneumonia vaccine. ? Shingles vaccine. ? Tetanus, diphtheria, and pertussis (Tdap) booster vaccine. Your health care provider may also recommend other immunizations. Tell your health care provider if you have ever been abused or do not feel safe at home. Summary  Menopause is a normal process in which your ability to get pregnant comes to an end.  This condition causes hot flashes, night sweats, decreased interest in sex, mood swings, headaches, or lack of sleep.  Treatment for this  condition may include hormone replacement therapy.  Take actions to keep yourself healthy, including exercising regularly, eating a healthy diet, watching your weight, and checking your blood pressure and blood sugar levels.  Get screened for cancer and depression. Make sure that you are up to date with all your vaccines. This information is not intended to replace advice given to you by your health care provider. Make sure you discuss any questions you have with your health care provider. Document Revised: 09/19/2018 Document Reviewed: 09/19/2018 Elsevier Patient Education  2020 Elsevier Inc. Estradiol skin patches What is this medicine? ESTRADIOL (es tra DYE ole) skin patches contain an estrogen. It is mostly used as hormone replacement in menopausal women. It helps to treat hot flashes and prevent osteoporosis. It is also used to treat women with low estrogen levels or those who have had their ovaries removed. This medicine may be used for other purposes; ask your health care provider or pharmacist if you have questions. COMMON BRAND NAME(S): Alora, Climara, DOTTI, Esclim, Estraderm, FemPatch, Menostar, Minivelle, Vivelle, Vivelle-Dot What should I tell my health care provider before I take this medicine? They need to know if you have any of these conditions:  abnormal vaginal bleeding  blood vessel disease or  blood clots  breast, cervical, endometrial, ovarian, liver, or uterine cancer  dementia  diabetes  gallbladder disease  heart disease or recent heart attack  high blood pressure  high cholesterol  high level of calcium in the blood  hysterectomy  kidney disease  liver disease  migraine headaches  protein C deficiency  protein S deficiency  stroke  systemic lupus erythematosus (SLE)  tobacco smoker  an unusual or allergic reaction to estrogens, other hormones, medicines, foods, dyes, or preservatives  pregnant or trying to get  pregnant  breast-feeding How should I use this medicine? This medicine is for external use only. Follow the directions on the prescription label. Tear open the pouch, do not use scissors. Remove the stiff protective liner covering the adhesive. Try not to touch the adhesive. Apply the patch, sticky side to the skin, to an area that is clean, dry and hairless. Avoid injured, irritated, calloused, or scarred areas. Do not apply the skin patches to your breasts or around the waistline. Use a different site each time to prevent skin irritation. Do not cut or trim the patch. Do not stop using except on the advice of your doctor or health care professional. Do not wear more than one patch at a time unless you are told to do so by your doctor or health care professional. Contact your pediatrician regarding the use of this medicine in children. Special care may be needed. A patient package insert for the product will be given with each prescription and refill. Read this sheet carefully each time. The sheet may change frequently. Overdosage: If you think you have taken too much of this medicine contact a poison control center or emergency room at once. NOTE: This medicine is only for you. Do not share this medicine with others. What if I miss a dose? If you miss a dose, apply it as soon as you can. If it is almost time for your next dose, apply only that dose. Do not apply double or extra doses. What may interact with this medicine? Do not take this medicine with any of the following medications:  aromatase inhibitors like aminoglutethimide, anastrozole, exemestane, letrozole, testolactone This medicine may also interact with the following medications:  carbamazepine  certain antibiotics used to treat infections  certain barbiturates used for inducing sleep or treating seizures  grapefruit juice  medicines for fungus infections like itraconazole and ketoconazole  raloxifene or  tamoxifen  rifabutin, rifampin, or rifapentine  ritonavir  St. John's Wort This list may not describe all possible interactions. Give your health care provider a list of all the medicines, herbs, non-prescription drugs, or dietary supplements you use. Also tell them if you smoke, drink alcohol, or use illegal drugs. Some items may interact with your medicine. What should I watch for while using this medicine? Visit your doctor or health care professional for regular checks on your progress. You will need a regular breast and pelvic exam and Pap smear while on this medicine. You should also discuss the need for regular mammograms with your health care professional, and follow his or her guidelines for these tests. This medicine can make your body retain fluid, making your fingers, hands, or ankles swell. Your blood pressure can go up. Contact your doctor or health care professional if you feel you are retaining fluid. If you have any reason to think you are pregnant, stop taking this medicine right away and contact your doctor or health care professional. Smoking increases the risk of getting a  blood clot or having a stroke while you are taking this medicine, especially if you are more than 50 years old. You are strongly advised not to smoke. If you wear contact lenses and notice visual changes, or if the lenses begin to feel uncomfortable, consult your eye doctor or health care professional. This medicine can increase the risk of developing a condition (endometrial hyperplasia) that may lead to cancer of the lining of the uterus. Taking progestins, another hormone drug, with this medicine lowers the risk of developing this condition. Therefore, if your uterus has not been removed (by a hysterectomy), your doctor may prescribe a progestin for you to take together with your estrogen. You should know, however, that taking estrogens with progestins may have additional health risks. You should discuss the  use of estrogens and progestins with your health care professional to determine the benefits and risks for you. If you are going to have surgery or an MRI, you may need to stop taking this medicine. Consult your health care professional for advice before you schedule the surgery. Contact with water while you are swimming, using a sauna, bathing, or showering may cause the patch to fall off. If your patch falls off reapply it. If you cannot reapply the patch, apply a new patch to another area and continue to follow your usual dose schedule. What side effects may I notice from receiving this medicine? Side effects that you should report to your doctor or health care professional as soon as possible:  allergic reactions like skin rash, itching or hives, swelling of the face, lips, or tongue  breast tissue changes or discharge  changes in vision  chest pain  confusion, trouble speaking or understanding  dark urine  general ill feeling or flu-like symptoms  light-colored stools  nausea, vomiting  pain, swelling, warmth in the leg  right upper belly pain  severe headaches  shortness of breath  sudden numbness or weakness of the face, arm or leg  trouble walking, dizziness, loss of balance or coordination  unusual vaginal bleeding  yellowing of the eyes or skin Side effects that usually do not require medical attention (report to your doctor or health care professional if they continue or are bothersome):  hair loss  increased hunger or thirst  increased urination  symptoms of vaginal infection like itching, irritation or unusual discharge  unusually weak or tired This list may not describe all possible side effects. Call your doctor for medical advice about side effects. You may report side effects to FDA at 1-800-FDA-1088. Where should I keep my medicine? Keep out of the reach of children. Store at room temperature below 30 degrees C (86 degrees F). Do not store any  patches that have been removed from their protective pouch. Throw away any unused medicine after the expiration date. Dispose of used patches properly. Since used patches may still contain active hormones, fold the patch in half so that it sticks to itself prior to disposal. NOTE: This sheet is a summary. It may not cover all possible information. If you have questions about this medicine, talk to your doctor, pharmacist, or health care provider.  2020 Elsevier/Gold Standard (2016-04-19 12:58:11) Kegel Exercises  Kegel exercises can help strengthen your pelvic floor muscles. The pelvic floor is a group of muscles that support your rectum, small intestine, and bladder. In females, pelvic floor muscles also help support the womb (uterus). These muscles help you control the flow of urine and stool. Kegel exercises are painless and  simple, and they do not require any equipment. Your provider may suggest Kegel exercises to:  Improve bladder and bowel control.  Improve sexual response.  Improve weak pelvic floor muscles after surgery to remove the uterus (hysterectomy) or pregnancy (females).  Improve weak pelvic floor muscles after prostate gland removal or surgery (males). Kegel exercises involve squeezing your pelvic floor muscles, which are the same muscles you squeeze when you try to stop the flow of urine or keep from passing gas. The exercises can be done while sitting, standing, or lying down, but it is best to vary your position. Exercises How to do Kegel exercises: 1. Squeeze your pelvic floor muscles tight. You should feel a tight lift in your rectal area. If you are a female, you should also feel a tightness in your vaginal area. Keep your stomach, buttocks, and legs relaxed. 2. Hold the muscles tight for up to 10 seconds. 3. Breathe normally. 4. Relax your muscles. 5. Repeat as told by your health care provider. Repeat this exercise daily as told by your health care provider. Continue  to do this exercise for at least 4-6 weeks, or for as long as told by your health care provider. You may be referred to a physical therapist who can help you learn more about how to do Kegel exercises. Depending on your condition, your health care provider may recommend:  Varying how long you squeeze your muscles.  Doing several sets of exercises every day.  Doing exercises for several weeks.  Making Kegel exercises a part of your regular exercise routine. This information is not intended to replace advice given to you by your health care provider. Make sure you discuss any questions you have with your health care provider. Document Revised: 05/16/2018 Document Reviewed: 05/16/2018 Elsevier Patient Education  [place].

## 2020-10-06 ENCOUNTER — Other Ambulatory Visit: Payer: Self-pay | Admitting: Nurse Practitioner

## 2020-10-06 DIAGNOSIS — B9689 Other specified bacterial agents as the cause of diseases classified elsewhere: Secondary | ICD-10-CM

## 2020-10-06 DIAGNOSIS — N760 Acute vaginitis: Secondary | ICD-10-CM

## 2020-10-06 LAB — VAGINITIS/VAGINOSIS, DNA PROBE
Candida Species: NEGATIVE
Gardnerella vaginalis: POSITIVE — AB
Trichomonas vaginosis: NEGATIVE

## 2020-10-06 MED ORDER — METRONIDAZOLE 0.75 % VA GEL: VAGINAL | 0 refills | Status: DC

## 2020-10-16 NOTE — Progress Notes (Deleted)
NEUROLOGY CONSULTATION NOTE  Valerie Richards MRN: [id number] DOB: 09/05/1971  Referring provider: Lin Landsman, MD Primary care provider: Lin Landsman, MD  Reason for consult:  dizziness   Subjective:  Valerie Richards is a 50 year old ***-handed female who presents for dizziness.  History supplemented by referring provider's note.  In October, she was at work in [employer] when she suddenly became dizzy, described as ***.  She had difficulty seeing ***.  She refused EMS transport to the hospital.  Symptoms improved.  A few days later, she had another episode of dizziness associated with numbness and tingling in the hands while driving.  ***.  She has depression and anxiety, for which she was started on sertraline.  She has chronic neck and back pain with degenerative changes in the cervical spine.  Working in [employer] has aggravated her pain and she started having frequent headaches.  ***.   Current medications:  Zoloft 50mg  daily, trazodone 50mg  daily, mirtazapine 15mg  daily, Soma 350mg  TID, tramadol 50mg  TID, Vistaril  06/17/2020 Radiographs (personally reviewed): - Cervical spine:  No fracture or static subluxation of the cervical spine.  Degenerative straightening of the normal cervical lordosis. Focally moderate disc space height loss and osteophytosis of C6-C7 with otherwise preserved disc spaces. No significant bony neural foraminal stenosis.  - Thoracic spine:  Negative - Lumbar spine:  No acute fracture, traumatic listhesis, or severe degenerative changes of the lumbar spine.  PAST MEDICAL HISTORY: Past Medical History:  Diagnosis Date  . Anemia    iron deficiency  . Fibroid     PAST SURGICAL HISTORY: Past Surgical History:  Procedure Laterality Date  . EYE SURGERY Bilateral   . TONSILLECTOMY      MEDICATIONS: Current Outpatient Medications on File Prior to Visit  Medication Sig Dispense Refill  . buPROPion (WELLBUTRIN XL) 150 MG 24 hr tablet Take 150  mg by mouth at bedtime.    Marland Kitchen escitalopram (LEXAPRO) 10 MG tablet Take 10 mg by mouth daily. (Patient not taking: Reported on 10/05/2020)    . estradiol (VIVELLE-DOT) 0.05 MG/24HR patch Place 1 patch (0.05 mg total) onto the skin 2 (two) times a week. 8 patch 12  . FLUoxetine (PROZAC) 10 MG capsule  (Patient not taking: Reported on 10/05/2020)    . meloxicam (MOBIC) 7.5 MG tablet meloxicam 7.5 mg tablet    . methocarbamol (ROBAXIN) 500 MG tablet Take by mouth. (Patient not taking: Reported on 10/05/2020)    . metroNIDAZOLE (METROGEL) 0.75 % vaginal gel Use one applicator full vaginally hs x 5 nights 70 g 0  . omeprazole (PRILOSEC) 40 MG capsule Take 40 mg by mouth daily.    . progesterone (PROMETRIUM) 100 MG capsule Take 1 capsule (100 mg total) by mouth daily. 30 capsule 12  . QUEtiapine (SEROQUEL) 50 MG tablet  (Patient not taking: Reported on 10/05/2020)    . sertraline (ZOLOFT) 50 MG tablet Take 50 mg by mouth daily. (Patient not taking: Reported on 10/05/2020)    . traZODone (DESYREL) 100 MG tablet     . zolpidem (AMBIEN) 5 MG tablet Take 5 mg by mouth at bedtime as needed.     No current facility-administered medications on file prior to visit.    ALLERGIES: No Known Allergies  FAMILY HISTORY: Family History  Problem Relation Age of Onset  . Hypertension Mother   . Cancer Father   . Diabetes Father   . Diabetes Brother    ***.  SOCIAL HISTORY: Social  History   Socioeconomic History  . Marital status: Widowed    Spouse name: Not on file  . Number of children: Not on file  . Years of education: Not on file  . Highest education level: Not on file  Occupational History  . Occupation: Scientist, water quality    Comment: Runner, broadcasting/film/video  Tobacco Use  . Smoking status: Former Smoker    Packs/day: 0.25    Years: 8.00    Pack years: 2.00    Types: Cigarettes    Quit date: 10/10/2012    Years since quitting: 8.0  . Smokeless tobacco: Never Used  Vaping Use  . Vaping Use: Never  used  Substance and Sexual Activity  . Alcohol use: No    Alcohol/week: 0.0 standard drinks  . Drug use: No  . Sexual activity: Not Currently    Birth control/protection: Post-menopausal  Other Topics Concern  . Not on file  Social History Narrative   Widowed 12-05-2015. Husband died from [place] disease.    Social Determinants of Health   Financial Resource Strain: Not on file  Food Insecurity: Not on file  Transportation Needs: Not on file  Physical Activity: Not on file  Stress: Not on file  Social Connections: Not on file  Intimate Partner Violence: Not on file    Objective:  *** General: No acute distress.  Patient appears well-groomed.   Head:  Normocephalic/atraumatic Eyes:  fundi examined but not visualized Neck: supple, no paraspinal tenderness, full range of motion Back: No paraspinal tenderness Heart: regular rate and rhythm Lungs: Clear to auscultation bilaterally. Vascular: No carotid bruits. Neurological Exam: Mental status: alert and oriented to person, place, and time, recent and remote memory intact, fund of knowledge intact, attention and concentration intact, speech fluent and not dysarthric, language intact. Cranial nerves: CN I: not tested CN II: pupils equal, round and reactive to light, visual fields intact CN III, IV, VI:  full range of motion, no nystagmus, no ptosis CN V: facial sensation intact. CN VII: upper and lower face symmetric CN VIII: hearing intact CN IX, X: gag intact, uvula midline CN XI: sternocleidomastoid and trapezius muscles intact CN XII: tongue midline Bulk & Tone: normal, no fasciculations. Motor:  muscle strength 5/5 throughout Sensation:  Pinprick, temperature and vibratory sensation intact. Deep Tendon Reflexes:  2+ throughout,  toes downgoing.   Finger to nose testing:  Without dysmetria.   Heel to shin:  Without dysmetria.   Gait:  Normal station and stride.  Romberg negative.  Assessment/Plan:    ***    Thank you for allowing me to take part in the care of this patient.  Metta Clines, DO  CC:  Lin Landsman, MD

## 2020-10-19 ENCOUNTER — Ambulatory Visit: Payer: BLUE CROSS/BLUE SHIELD | Admitting: Neurology

## 2020-10-19 ENCOUNTER — Other Ambulatory Visit: Payer: Self-pay

## 2020-10-29 ENCOUNTER — Ambulatory Visit: Payer: BC Managed Care – PPO | Admitting: Family Medicine

## 2020-10-30 ENCOUNTER — Ambulatory Visit: Payer: BC Managed Care – PPO | Admitting: Gastroenterology

## 2020-11-20 ENCOUNTER — Ambulatory Visit (INDEPENDENT_AMBULATORY_CARE_PROVIDER_SITE_OTHER): Payer: 59 | Admitting: Nurse Practitioner

## 2020-11-20 ENCOUNTER — Other Ambulatory Visit: Payer: Self-pay

## 2020-11-20 ENCOUNTER — Encounter: Payer: Self-pay | Admitting: Nurse Practitioner

## 2020-11-20 VITALS — BP 106/64 | HR 68 | Resp 16 | Wt 137.0 lb

## 2020-11-20 DIAGNOSIS — N898 Other specified noninflammatory disorders of vagina: Secondary | ICD-10-CM

## 2020-11-20 DIAGNOSIS — N3090 Cystitis, unspecified without hematuria: Secondary | ICD-10-CM

## 2020-11-20 DIAGNOSIS — R300 Dysuria: Secondary | ICD-10-CM | POA: Diagnosis not present

## 2020-11-20 LAB — WET PREP FOR TRICH, YEAST, CLUE

## 2020-11-20 MED ORDER — CEPHALEXIN 500 MG PO CAPS: 500.0000 mg | ORAL_CAPSULE | Freq: Two times a day (BID) | ORAL | 0 refills | Status: AC

## 2020-11-20 NOTE — Patient Instructions (Signed)
Urinary Tract Infection, Adult A urinary tract infection (UTI) is an infection of any part of the urinary tract. The urinary tract includes:  The kidneys.  The ureters.  The bladder.  The urethra. These organs make, store, and get rid of pee (urine) in the body. What are the causes? This infection is caused by germs (bacteria) in your genital area. These germs grow and cause swelling (inflammation) of your urinary tract. What increases the risk? The following factors may make you more likely to develop this condition:  Using a small, thin tube (catheter) to drain pee.  Not being able to control when you pee or poop (incontinence).  Being female. If you are female, these things can increase the risk: ? Using these methods to prevent pregnancy:  A medicine that kills sperm (spermicide).  A device that blocks sperm (diaphragm). ? Having low levels of a female hormone (estrogen). ? Being pregnant. You are more likely to develop this condition if:  You have genes that add to your risk.  You are sexually active.  You take antibiotic medicines.  You have trouble peeing because of: ? A prostate that is bigger than normal, if you are female. ? A blockage in the part of your body that drains pee from the bladder. ? A kidney stone. ? A nerve condition that affects your bladder. ? Not getting enough to drink. ? Not peeing often enough.  You have other conditions, such as: ? Diabetes. ? A weak disease-fighting system (immune system). ? Sickle cell disease. ? Gout. ? Injury of the spine. What are the signs or symptoms? Symptoms of this condition include:  Needing to pee right away.  Peeing small amounts often.  Pain or burning when peeing.  Blood in the pee.  Pee that smells bad or not like normal.  Trouble peeing.  Pee that is cloudy.  Fluid coming from the vagina, if you are female.  Pain in the belly or lower back. Other symptoms include:  Vomiting.  Not  feeling hungry.  Feeling mixed up (confused). This may be the first symptom in older adults.  Being tired and grouchy (irritable).  A fever.  Watery poop (diarrhea). How is this treated?  Taking antibiotic medicine.  Taking other medicines.  Drinking enough water. In some cases, you may need to see a specialist. Follow these instructions at home: Medicines  Take over-the-counter and prescription medicines only as told by your doctor.  If you were prescribed an antibiotic medicine, take it as told by your doctor. Do not stop taking it even if you start to feel better. General instructions  Make sure you: ? Pee until your bladder is empty. ? Do not hold pee for a long time. ? Empty your bladder after sex. ? Wipe from front to back after peeing or pooping if you are a female. Use each tissue one time when you wipe.  Drink enough fluid to keep your pee pale yellow.  Keep all follow-up visits.   Contact a doctor if:  You do not get better after 1-2 days.  Your symptoms go away and then come back. Get help right away if:  You have very bad back pain.  You have very bad pain in your lower belly.  You have a fever.  You have chills.  You feeling like you will vomit or you vomit. Summary  A urinary tract infection (UTI) is an infection of any part of the urinary tract.  This condition is caused by   germs in your genital area.  There are many risk factors for a UTI.  Treatment includes antibiotic medicines.  Drink enough fluid to keep your pee pale yellow. This information is not intended to replace advice given to you by your health care provider. Make sure you discuss any questions you have with your health care provider. Document Revised: 05/08/2020 Document Reviewed: 05/08/2020 Elsevier Patient Education  2021 Elsevier Inc.  

## 2020-11-20 NOTE — Progress Notes (Signed)
GYNECOLOGY  VISIT  CC:   Vaginal discharge and dysuria x 7 days  HPI: 50 y.o. [id number] Widowed Other or two or more races female here for vaginitis.   Not sexually active Started estradiol patch and prometrium and is doing very well. Started noticing vaginal discharge 2 weeks ago, thinks she has an odor x 1 week. She also has burning with urination, this has been going on about 1 week. Has not had any vaginal bleeding  GYNECOLOGIC HISTORY: Patient's last menstrual period was 02/26/2020 (exact date). Contraception: post menopasal Menopausal hormone therapy: vivelle dot patch, prometrium  Patient Active Problem List   Diagnosis Date Noted  . Uterine leiomyoma 11/30/2017  . Symptomatic anemia 08/02/2016    Past Medical History:  Diagnosis Date  . Anemia    iron deficiency  . Fibroid     Past Surgical History:  Procedure Laterality Date  . EYE SURGERY Bilateral   . TONSILLECTOMY      MEDS:   Current Outpatient Medications on File Prior to Visit  Medication Sig Dispense Refill  . buPROPion (WELLBUTRIN XL) 150 MG 24 hr tablet Take 150 mg by mouth at bedtime.    Marland Kitchen escitalopram (LEXAPRO) 10 MG tablet Take 10 mg by mouth daily. (Patient not taking: Reported on 10/05/2020)    . estradiol (VIVELLE-DOT) 0.05 MG/24HR patch Place 1 patch (0.05 mg total) onto the skin 2 (two) times a week. 8 patch 12  . FLUoxetine (PROZAC) 10 MG capsule  (Patient not taking: Reported on 10/05/2020)    . meloxicam (MOBIC) 7.5 MG tablet meloxicam 7.5 mg tablet    . methocarbamol (ROBAXIN) 500 MG tablet Take by mouth. (Patient not taking: Reported on 10/05/2020)    . metroNIDAZOLE (METROGEL) 0.75 % vaginal gel Use one applicator full vaginally hs x 5 nights 70 g 0  . omeprazole (PRILOSEC) 40 MG capsule Take 40 mg by mouth daily.    . progesterone (PROMETRIUM) 100 MG capsule Take 1 capsule (100 mg total) by mouth daily. 30 capsule 12  . QUEtiapine (SEROQUEL) 50 MG tablet  (Patient not taking: Reported on  10/05/2020)    . sertraline (ZOLOFT) 50 MG tablet Take 50 mg by mouth daily. (Patient not taking: Reported on 10/05/2020)    . traZODone (DESYREL) 100 MG tablet     . zolpidem (AMBIEN) 5 MG tablet Take 5 mg by mouth at bedtime as needed.     No current facility-administered medications on file prior to visit.    ALLERGIES: Patient has no known allergies.  Family History  Problem Relation Age of Onset  . Hypertension Mother   . Cancer Father   . Diabetes Father   . Diabetes Brother      Review of Systems  Constitutional: Negative.   HENT: Negative.   Eyes: Negative.   Respiratory: Negative.   Cardiovascular: Negative.   Gastrointestinal: Negative.   Endocrine: Negative.   Genitourinary:       Vaginal irritation, itching, discharge & odor  Musculoskeletal: Negative.   Skin: Negative.   Allergic/Immunologic: Negative.   Neurological: Negative.   Hematological: Negative.   Psychiatric/Behavioral: Negative.     PHYSICAL EXAMINATION:    LMP 02/26/2020 (Exact Date)     General appearance: alert, cooperative, no acute distress Lymph:  no inguinal LAD noted  Pelvic: External genitalia:  no lesions              Urethra:  normal appearing urethra with no masses, tenderness or lesions  Bartholins and Skenes: normal                 Vagina: normal appearing vagina, creamy discharge              Cervix: no cervical motion tenderness and no lesions              Bimanual Exam:  Uterus:  normal size, contour, position, consistency, mobility, non-tender              Adnexa: no mass, fullness, tenderness               Chaperone, Joy, CMA, was present for exam.  Assessment/Plan: Vaginal discharge - Plan: WET PREP FOR [place], YEAST, CLUE  Dysuria - Plan: Urinalysis,Complete w/RFL Culture, cephALEXin (KEFLEX) 500 MG capsule  Cystitis  Reassured wet mount looks normal, discharge may be increased r/t hormones Urine sent for culture.

## 2020-11-22 LAB — URINALYSIS, COMPLETE W/RFL CULTURE
Bilirubin Urine: NEGATIVE
Glucose, UA: NEGATIVE
Hyaline Cast: NONE SEEN /LPF
Ketones, ur: NEGATIVE
Leukocyte Esterase: NEGATIVE
Nitrites, Initial: NEGATIVE
Protein, ur: NEGATIVE
Specific Gravity, Urine: 1.025 (ref 1.001–1.03)
pH: 5.5 (ref 5.0–8.0)

## 2020-11-22 LAB — CULTURE INDICATED

## 2020-11-22 LAB — URINE CULTURE
MICRO NUMBER:: 11524652
Result:: NO GROWTH
SPECIMEN QUALITY:: ADEQUATE

## 2020-11-22 NOTE — Progress Notes (Signed)
Please notify patient that although her symptoms were similar to a bladder infection, and she had blood in her urine, which seemed like it could be from a urinary tract infection, but the culture showed that she actually did not have a bladder infection. It is important to follow up on her symptoms and retest her urine. If she still has blood in her urine, she will need a referral to a urologist.

## 2020-11-23 ENCOUNTER — Other Ambulatory Visit: Payer: 59

## 2020-11-23 ENCOUNTER — Other Ambulatory Visit: Payer: Self-pay

## 2020-11-23 DIAGNOSIS — R319 Hematuria, unspecified: Secondary | ICD-10-CM

## 2020-11-24 ENCOUNTER — Telehealth: Payer: Self-pay

## 2020-11-24 ENCOUNTER — Other Ambulatory Visit: Payer: Self-pay | Admitting: Nurse Practitioner

## 2020-11-24 DIAGNOSIS — N9489 Other specified conditions associated with female genital organs and menstrual cycle: Secondary | ICD-10-CM

## 2020-11-24 DIAGNOSIS — R3129 Other microscopic hematuria: Secondary | ICD-10-CM

## 2020-11-24 MED ORDER — TRIAMCINOLONE ACETONIDE 0.1 % EX OINT: TOPICAL_OINTMENT | CUTANEOUS | 1 refills | Status: DC

## 2020-11-24 NOTE — Telephone Encounter (Signed)
Patient was informed of the result note.  SHe is concerned about why there is blood in her urine.  She said she is still having a little burning inside with urination. It is a little better. SHe said she is still having vaginal discharge but that is also "a little better".

## 2020-11-24 NOTE — Telephone Encounter (Signed)
-----   Message from Karma Ganja, NP sent at 11/24/2020  8:32 AM EST ----- Will you call this patient and let her know that there is a small amount of blood in urine but it is improved. Can you ask her what her symptoms are, and if she is improving, so I can guide her to next steps.

## 2020-11-24 NOTE — Progress Notes (Signed)
Pt states she is mostly feeling better but still having some burning on vulva when urination. Notified that still small amount of blood noted in urine but it is less than before and may not be in urine at all if contaminated specimen.  For now will try triamcinolone on vulvar x 2 week or until symptoms resolve.  Repeat urinalysis in about 1 month (future order placed). If still blood in urinalysis, will send to urology. kd

## 2020-11-25 LAB — URINALYSIS, COMPLETE W/RFL CULTURE
Bacteria, UA: NONE SEEN /HPF
Bilirubin Urine: NEGATIVE
Glucose, UA: NEGATIVE
Hyaline Cast: NONE SEEN /LPF
Ketones, ur: NEGATIVE
Leukocyte Esterase: NEGATIVE
Nitrites, Initial: NEGATIVE
Protein, ur: NEGATIVE
Specific Gravity, Urine: 1.013 (ref 1.001–1.03)
Squamous Epithelial / HPF: NONE SEEN /HPF (ref <=5)
WBC, UA: NONE SEEN /HPF (ref 0–5)
pH: 6 (ref 5.0–8.0)

## 2020-11-25 LAB — URINE CULTURE
MICRO NUMBER:: 11534673
Result:: NO GROWTH
SPECIMEN QUALITY:: ADEQUATE

## 2020-11-25 LAB — CULTURE INDICATED

## 2020-12-08 DIAGNOSIS — I639 Cerebral infarction, unspecified: Secondary | ICD-10-CM

## 2020-12-08 HISTORY — DX: Cerebral infarction, unspecified: I63.9

## 2020-12-14 NOTE — Progress Notes (Signed)
NEUROLOGY CONSULTATION NOTE  Valerie Richards MRN: [id number] DOB: July 29, 1971  Referring provider: Rachell Cipro, MD Primary care provider: Rachell Cipro, MD  Reason for consult:  dizziness  Assessment/Plan:   1.  Migraine without aura, without status migrainosus, not intractable - possibly cervicogenic but rule out secondary intracranial abnormality 2.  Dizziness - likely benign paroxysmal positional vertigo or possibly cervicogenic - too brief to be migraine-related 3.  Cervicalgia  1.  Check MRI of brain with and without contrast 2. Start topiramate 25mg  at bedtime for one week, then 50mg  at bedtime 3.  Tylenol as needed.  Limit use of pain relievers to no more than 2 days out of week to prevent risk of rebound or medication-overuse headache. 4.  Keep headache diary 5.  Follow up 4 to 6 months.  Subjective:  Valerie Richards is a 50 year old female with history of iron-deficiency anemia who presents for dizziness.  History supplemented by primary care note.  Beginning around September 2021, she started having dizziness.  It is positional.  It is a spinning sensation lasting seconds to a couple of minutes.  Associated with blurred vision and sometimes nausea.  Occurs every other day to twice a day.    Around the same time, she developed new onset headaches.  They are severe right frontal pounding headaches associated with nausea, photophobia and sometimes phonophobia.  They sometimes wake her up at night from sleep.  It lasts 2 hours with Tylenol.  They occur every other day.  No known triggers.  However she does have chronic neck pain radiating into the right shoulder.  X-ray cervical spine from 06/17/2020 personally reviewed showed degenerative straightening of the cervical lordosis with focally moderate disc space height loss and osteophytosis of C6-C7.    Current medications: Wellbutrin XL sertraline 50mg  meloxicam Estradiol, progesterone  PAST MEDICAL HISTORY: Past Medical  History:  Diagnosis Date  . Anemia    iron deficiency  . Fibroid     PAST SURGICAL HISTORY: Past Surgical History:  Procedure Laterality Date  . EYE SURGERY Bilateral   . TONSILLECTOMY      MEDICATIONS: Current Outpatient Medications on File Prior to Visit  Medication Sig Dispense Refill  . buPROPion (WELLBUTRIN XL) 150 MG 24 hr tablet Take 150 mg by mouth at bedtime.    Marland Kitchen estradiol (VIVELLE-DOT) 0.05 MG/24HR patch Place 1 patch (0.05 mg total) onto the skin 2 (two) times a week. 8 patch 12  . meloxicam (MOBIC) 7.5 MG tablet meloxicam 7.5 mg tablet    . omeprazole (PRILOSEC) 40 MG capsule Take 40 mg by mouth daily.    . progesterone (PROMETRIUM) 100 MG capsule Take 1 capsule (100 mg total) by mouth daily. 30 capsule 12  . sertraline (ZOLOFT) 50 MG tablet Take 50 mg by mouth daily.    Marland Kitchen triamcinolone ointment (KENALOG) 0.1 % Apply small amt twice per day x 14 days then twice weekly 30 g 1  . zolpidem (AMBIEN) 5 MG tablet Take 5 mg by mouth at bedtime as needed.     No current facility-administered medications on file prior to visit.    ALLERGIES: No Known Allergies  FAMILY HISTORY: Family History  Problem Relation Age of Onset  . Hypertension Mother   . Cancer Father   . Diabetes Father   . Diabetes Brother     Objective:  Blood pressure 124/69, pulse 65, resp. rate 18, height 5\' 5"  (1.651 m), weight 135 lb (61.2 kg), last menstrual period 02/26/2020,  SpO2 96 %. General: No acute distress.  Patient appears well-groomed.   Head:  Normocephalic/atraumatic Eyes:  fundi examined but not visualized Neck: supple, right paraspinal tenderness, full range of motion Back: No paraspinal tenderness Heart: regular rate and rhythm Lungs: Clear to auscultation bilaterally. Vascular: No carotid bruits. Neurological Exam: Mental status: alert and oriented to person, place, and time, recent and remote memory intact, fund of knowledge intact, attention and concentration intact, speech  fluent and not dysarthric, language intact. Cranial nerves: CN I: not tested CN II: pupils equal, round and reactive to light, visual fields intact CN III, IV, VI:  full range of motion, no nystagmus, no ptosis CN V: facial sensation intact. CN VII: upper and lower face symmetric CN VIII: hearing intact CN IX, X: gag intact, uvula midline CN XI: sternocleidomastoid and trapezius muscles intact CN XII: tongue midline Bulk & Tone: normal, no fasciculations. Motor:  muscle strength 5/5 throughout Sensation:  Pinprick, temperature and vibratory sensation intact. Deep Tendon Reflexes:  2+ throughout,  toes downgoing.   Finger to nose testing:  Without dysmetria.   Heel to shin:  Without dysmetria.   Gait:  Normal station and stride.  Romberg negative.    Thank you for allowing me to take part in the care of this patient.  Metta Clines, DO  CC:  Rachell Cipro, MD

## 2020-12-15 ENCOUNTER — Ambulatory Visit: Payer: 59 | Admitting: Neurology

## 2020-12-15 ENCOUNTER — Encounter: Payer: Self-pay | Admitting: Neurology

## 2020-12-15 ENCOUNTER — Other Ambulatory Visit: Payer: Self-pay

## 2020-12-15 VITALS — BP 124/69 | HR 65 | Resp 18 | Ht 65.0 in | Wt 135.0 lb

## 2020-12-15 DIAGNOSIS — G43009 Migraine without aura, not intractable, without status migrainosus: Secondary | ICD-10-CM | POA: Diagnosis not present

## 2020-12-15 DIAGNOSIS — R519 Headache, unspecified: Secondary | ICD-10-CM

## 2020-12-15 DIAGNOSIS — M542 Cervicalgia: Secondary | ICD-10-CM

## 2020-12-15 DIAGNOSIS — R42 Dizziness and giddiness: Secondary | ICD-10-CM

## 2020-12-15 MED ORDER — TOPIRAMATE 25 MG PO TABS: ORAL_TABLET | ORAL | 0 refills | Status: DC

## 2020-12-15 NOTE — Patient Instructions (Signed)
  1. Check MRI of brain with and without contrast 2. Start topiramate 25mg   - take 1 tablet at bedtime for one week, then increase to 2 tablets at bedtime.  Contact us in 5 weeks with update and we can increase dose if needed. 3. Limit use of pain relievers to no more than 2 days out of the week.  These medications include acetaminophen, NSAIDs (ibuprofen/Advil/Motrin, naproxen/Aleve, triptans (Imitrex/sumatriptan), Excedrin, and narcotics.  This will help reduce risk of rebound headaches. 4. Be aware of common food triggers:  - Caffeine:  coffee, black tea, cola, Mt. Dew  - Chocolate  - Dairy:  aged cheeses (brie, blue, cheddar, gouda, [place], provolone, [place], Swiss, etc), chocolate milk, buttermilk, sour cream, limit eggs and yogurt  - Nuts, peanut butter  - Alcohol  - Cereals/grains:  FRESH breads (fresh bagels, sourdough, doughnuts), yeast productions  - Processed/canned/aged/cured meats (pre-packaged deli meats, hotdogs)  - MSG/glutamate:  soy sauce, flavor enhancer, pickled/preserved/marinated foods  - Sweeteners:  aspartame (Equal, Nutrasweet).  Sugar and Splenda are okay  - Vegetables:  legumes (lima beans, lentils, snow peas, fava beans, pinto peans, peas, garbanzo beans), sauerkraut, onions, olives, pickles  - Fruit:  avocados, bananas, citrus fruit (orange, lemon, grapefruit), mango  - Other:  Frozen meals, macaroni and cheese 5. Routine exercise 6. Stay adequately hydrated (aim for 64 oz water daily) 7. Keep headache diary 8. Maintain proper stress management 9. Maintain proper sleep hygiene 10. Do not skip meals 11. Consider supplements:  magnesium citrate 400mg  daily, riboflavin 400mg  daily, coenzyme Q10 100mg  three times daily. 12.  Once MRI is reviewed, will likely refer you to physical therapy for dizziness and neck  13.  Follow up 4 to 6 months

## 2020-12-16 ENCOUNTER — Ambulatory Visit: Payer: BLUE CROSS/BLUE SHIELD | Admitting: Neurology

## 2020-12-17 ENCOUNTER — Ambulatory Visit (INDEPENDENT_AMBULATORY_CARE_PROVIDER_SITE_OTHER): Payer: 59 | Admitting: Gastroenterology

## 2020-12-17 ENCOUNTER — Encounter: Payer: Self-pay | Admitting: Gastroenterology

## 2020-12-17 VITALS — BP 88/56 | HR 73 | Ht 65.0 in | Wt 135.1 lb

## 2020-12-17 DIAGNOSIS — R140 Abdominal distension (gaseous): Secondary | ICD-10-CM

## 2020-12-17 DIAGNOSIS — R159 Full incontinence of feces: Secondary | ICD-10-CM

## 2020-12-17 DIAGNOSIS — Z1211 Encounter for screening for malignant neoplasm of colon: Secondary | ICD-10-CM

## 2020-12-17 DIAGNOSIS — R142 Eructation: Secondary | ICD-10-CM

## 2020-12-17 DIAGNOSIS — Z800 Family history of malignant neoplasm of digestive organs: Secondary | ICD-10-CM

## 2020-12-17 DIAGNOSIS — K5909 Other constipation: Secondary | ICD-10-CM

## 2020-12-17 DIAGNOSIS — R143 Flatulence: Secondary | ICD-10-CM

## 2020-12-17 DIAGNOSIS — R12 Heartburn: Secondary | ICD-10-CM

## 2020-12-17 MED ORDER — PSYLLIUM 0.36 G PO CAPS: ORAL_CAPSULE | ORAL | Status: DC

## 2020-12-17 MED ORDER — OMEPRAZOLE 40 MG PO CPDR: 40.0000 mg | DELAYED_RELEASE_CAPSULE | Freq: Every morning | ORAL | 3 refills | Status: DC

## 2020-12-17 NOTE — Patient Instructions (Addendum)
It was a pleasure to meet you today. Based on our discussion, I am providing you with my recommendations below:  RECOMMENDATION(S):   OVER THE COUNTER MEDICATION(S):   Please purchase the following medications over the counter and take as directed:  Marland Kitchen Metamucil - please take 1 dose daily for 7 days, then increase to 2 times daily  PRESCRIPTION MEDICATION(S):   We have sent the following medication(s) to your pharmacy:  . Omeprazole - please take 40mg  by mouth every morning  NOTE: If your medication(s) requires a PRIOR AUTHORIZATION, we will receive notification from your pharmacy. Once received, the process to submit for approval may take up to 7-10 business days. You will be contacted about any denials we have received from your insurance company as well as alternatives recommended by your provider.  COLONOSCOPY AND ENDOSCOPY:   . You have been scheduled for an endoscopy and a colonoscopy. Please follow the written instructions given to you at your visit today.  PREP:   . Please pick up your prep supplies at the pharmacy within the next 1-3 days.  INHALERS:   . If you use inhalers (even only as needed), please bring them with you on the day of your procedure.  COLONOSCOPY TIPS:  . To reduce nausea and dehydration, stay well hydrated for 3-4 days prior to the exam.  . To prevent skin/hemorrhoid irritation - prior to wiping, put A&Dointment or vaseline on the toilet paper. Marland Kitchen Keep a towel or pad on the bed.  Marland Kitchen BEFORE STARTING YOUR PREP, drink  64oz of clear liquids in the morning. This will help to flush the colon and will ensure you are well hydrated!!!!  NOTE - This is in addition to the fluids required for to complete your prep. . Use of a flavored hard candy, such as grape Anise Salvo, can counteract some of the flavor of the prep and may prevent some nausea.   BMI:  . If you are age 71 or younger, your body mass index should be between 19-25. Your There is no height or  weight on file to calculate BMI. If this is out of the aformentioned range listed, please consider follow up with your Primary Care Provider.   Thank you for trusting me with your gastrointestinal care!    Thornton Park, MD, MPH  Psyllium granules or powder for solution What is this medicine? PSYLLIUM (SIL i yum) is a bulk-forming fiber laxative. This medicine is used to treat constipation. Increasing fiber in the diet may also help lower cholesterol and promote heart health for some people. This medicine may be used for other purposes; ask your health care provider or pharmacist if you have questions. COMMON BRAND NAME(S): Fiber Therapy, GenFiber, Geri-Mucil, Hydrocil, Konsyl, Metamucil, Metamucil MultiHealth, Mucilin, Natural Fiber Therapy, Reguloid What should I tell my health care provider before I take this medicine? They need to know if you have any of these conditions:  blockage in your bowel  difficulty swallowing  inflammatory bowel disease  phenylketonuria  stomach or intestine problems  sudden change in bowel habits lasting more than 2 weeks  an unusual or allergic reaction to psyllium, other medicines, dyes, or preservatives  pregnant or trying or get pregnant  breast-feeding How should I use this medicine? Mix this medicine into a full glass (240 mL) of water or other cool drink. Take this medicine by mouth. Follow the directions on the package labeling, or take as directed by your health care professional. Take your medicine at regular  intervals. Do not take your medicine more often than directed. Talk to your pediatrician regarding the use of this medicine in children. While this drug may be prescribed for children as young as 9 years old for selected conditions, precautions do apply. Overdosage: If you think you have taken too much of this medicine contact a poison control center or emergency room at once. NOTE: This medicine is only for you. Do not share this  medicine with others. What if I miss a dose? If you miss a dose, take it as soon as you can. If it is almost time for your next dose, take only that dose. Do not take double or extra doses. What may interact with this medicine? Interactions are not expected. Take this product at least 2 hours before or after other medicines. This list may not describe all possible interactions. Give your health care provider a list of all the medicines, herbs, non-prescription drugs, or dietary supplements you use. Also tell them if you smoke, drink alcohol, or use illegal drugs. Some items may interact with your medicine. What should I watch for while using this medicine? Check with your doctor or health care professional if your symptoms do not start to get better or if they get worse. Stop using this medicine and contact your doctor or health care professional if you have rectal bleeding or if you have to treat your constipation for more than 1 week. These could be signs of a more serious condition. Drink several glasses of water a day while you are taking this medicine. This will help to relieve constipation and prevent dehydration. What side effects may I notice from receiving this medicine? Side effects that you should report to your doctor or health care professional as soon as possible:  allergic reactions like skin rash, itching or hives, swelling of the face, lips, or tongue  breathing problems  chest pain  nausea, vomiting  rectal bleeding  trouble swallowing Side effects that usually do not require medical attention (report to your doctor or health care professional if they continue or are bothersome):  bloating  gas  stomach cramps This list may not describe all possible side effects. Call your doctor for medical advice about side effects. You may report side effects to FDA at 1-800-FDA-1088. Where should I keep my medicine? Keep out of the reach of children. Store at room temperature  between 15 and 30 degrees C (59 and 86 degrees F). Protect from moisture. Throw away any unused medicine after the expiration date. NOTE: This sheet is a summary. It may not cover all possible information. If you have questions about this medicine, talk to your doctor, pharmacist, or health care provider.  2021 Elsevier/Gold Standard (2018-02-20 15:41:08)

## 2020-12-17 NOTE — Progress Notes (Signed)
Referring Provider: Karma Ganja, NP Primary Care Physician:  Fanny Bien, MD  Reason for Consultation:  Colon cancer screening, family history of colon cancer   IMPRESSION:  Chronic constipation with intermittent fecal soiling    - normal TSH, CBC, calcium, albumin    - ? Overflow diarrhea     - Colonoscopy with biopsies to evaluate for microscopic colitis recommended    - trial of Metamucil in the meantime Heartburn, eructation, distension and gas    - recent NSAID use    - trial of PPI recommended given differential of esophagitis, gastritis, GOO, malignancy    - at risk for bacterial overgrowth given her chronic constipation    - EGD with esophageal, gastric, and duodenal biopsies Family history of colon cancer (father in his 68s)    - colonoscopy recommended No prior colon cancer screening  PLAN: Pantoprazole 40 mg BID until time of endoscopy Avoid all NSAIDs Start Metamucil QD, increasing to BID after 1 week EGD Colonoscopy with 2 day bowel prep Cross-sectional abdominal imaging if endoscopy is negative  Please see the "Patient Instructions" section for addition details about the plan.  HPI: Valerie Richards is a 50 y.o. female referred by NP Dixon and Dr. Ernie Hew for colon cancer screening.  The history is obtained to the patient, records provided by Dr. Ernie Hew, and review of her electronic health record.  She has anxiety, depression, polyarthralgias. She is from [place].   Feels like she has an odor coming from her stomach. She has heartburn that occurs nearly daily over the last year. Associated eructation, distension and gas. Occasional chest pain. No identified food triggers. No dysphagia, odynophagia, dysphonia, throat pain, neck pain. Tried TUMS and parsley with no improvement.   She was prescribed omeprazole 40 mg QAM but she has been using only PRN.   Reports long term constipation that she manages with green tea. Frequently goes one week without a bowel  movement. Intermittent fecal incontinence with fecal soiling. Metamucil capsules recommended. Denies straining. Sense of incomplete evacuation. No blood or mucous in the stool.  Appetite is good. Weight is stable.  She uses Mobic PRN. Otherwise, manages pain with Tylenol.  Labs 09/07/2020 show a hemoglobin 13, MCV 89, RDW 11.7, platelets 289, TSH 1.83, Calcium 10, Normal liver enzymes H pylori breath test negative 09/14/20  No prior abdominal imaging. No prior endoscopy or colon cancer screening.   Father with colon caner at age 74. No other known family history of colon cancer or polyps. No family history of uterine/endometrial cancer, pancreatic cancer or gastric/stomach cancer.  She is hoping to have a colonoscopy prior to [facility] starting April 2nd.   Past Medical History:  Diagnosis Date  . Anemia    iron deficiency  . Fibroid     Past Surgical History:  Procedure Laterality Date  . EYE SURGERY Bilateral   . TONSILLECTOMY      Current Outpatient Medications  Medication Sig Dispense Refill  . buPROPion (WELLBUTRIN XL) 150 MG 24 hr tablet Take 150 mg by mouth at bedtime.    Marland Kitchen estradiol (VIVELLE-DOT) 0.05 MG/24HR patch Place 1 patch (0.05 mg total) onto the skin 2 (two) times a week. 8 patch 12  . meloxicam (MOBIC) 7.5 MG tablet meloxicam 7.5 mg tablet    . omeprazole (PRILOSEC) 40 MG capsule Take 40 mg by mouth daily.    . progesterone (PROMETRIUM) 100 MG capsule Take 1 capsule (100 mg total) by mouth daily. 30 capsule 12  .  sertraline (ZOLOFT) 50 MG tablet Take 50 mg by mouth daily.    Marland Kitchen topiramate (TOPAMAX) 25 MG tablet Take 1 tablet at bedtime for one week, then increase to 2 tablets at bedtime 60 tablet 0  . zolpidem (AMBIEN) 5 MG tablet Take 5 mg by mouth at bedtime as needed.     No current facility-administered medications for this visit.    Allergies as of 12/17/2020  . (No Known Allergies)    Family History  Problem Relation Age of Onset  . Hypertension  Mother   . Cancer Father   . Diabetes Father   . Colon cancer Father   . Kidney failure Father   . Diabetes Brother      Review of Systems: 12 system ROS is negative except as noted above with the addition of allergies, anxiety, arthritis, back pain, vision changes, depression, headaches, itching, muscle pains, night sweats, insomnia, urine leakae.   Physical Exam: General:   Alert,  well-nourished, pleasant and cooperative in NAD Head:  Normocephalic and atraumatic. Eyes:  Sclera clear, no icterus.   Conjunctiva pink. Ears:  Normal auditory acuity. Nose:  No deformity, discharge,  or lesions. Mouth:  No deformity or lesions.   Neck:  Supple; no masses or thyromegaly. Lungs:  Clear throughout to auscultation.   No wheezes. Heart:  Regular rate and rhythm; no murmurs. Abdomen:  Soft, nontender, nondistended, normal bowel sounds, no rebound or guarding. No hepatosplenomegaly.   Rectal:  Deferred  Msk:  Symmetrical. No boney deformities LAD: No inguinal or umbilical LAD Extremities:  No clubbing or edema. Neurologic:  Alert and  oriented x4;  grossly nonfocal Skin:  Intact without significant lesions or rashes. Psych:  Alert and cooperative. Normal mood and affect.     Kimberly L. Tarri Glenn, MD, MPH 12/17/2020, 1:37 PM

## 2020-12-31 ENCOUNTER — Other Ambulatory Visit: Payer: 59

## 2020-12-31 ENCOUNTER — Ambulatory Visit
Admission: RE | Admit: 2020-12-31 | Discharge: 2020-12-31 | Disposition: A | Discharge status: Home / Self Care | Payer: 59 | Source: Ambulatory Visit | Attending: Neurology | Admitting: Neurology

## 2020-12-31 DIAGNOSIS — R42 Dizziness and giddiness: Secondary | ICD-10-CM

## 2020-12-31 DIAGNOSIS — G43009 Migraine without aura, not intractable, without status migrainosus: Secondary | ICD-10-CM

## 2020-12-31 DIAGNOSIS — R519 Headache, unspecified: Secondary | ICD-10-CM

## 2020-12-31 IMAGING — MR MR HEAD WO/W CM
12 series · 48 of 48 positions shown · IV contrast (multihance)
Comparison: No pertinent prior exams available for comparison.

CLINICAL DATA: Migraine without TIGER and without status
migrainosus, not intractable. Dizziness. New onset of headaches
after age 50. Headache, new or worsening. Additional history
provided by scanning technologist: Patient reports history of
headaches for 8 years, increasing in severity over the last year.
Pain, nausea, depression, vision problems, difficulty walking,
numbness/weakness.

EXAM:
MRI HEAD WITHOUT AND WITH CONTRAST
TECHNIQUE: Multiplanar, multiecho pulse sequences of the brain and surrounding
structures were obtained without and with intravenous contrast.
CONTRAST:  12mL MULTIHANCE GADOBENATE DIMEGLUMINE 529 MG/ML IV SOLN

[Series 2: T1 · sagittal · 5.0mm · 0.45mm/px · 1 of 21 slices shown]
[im 1/21]
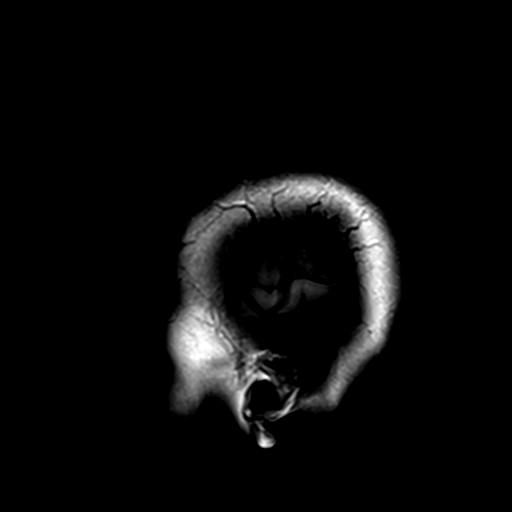

[Series 3: DWI · axial · 3.0mm · 1.80mm/px · z∈[-71,+76]mm · 7 of 97 slices shown (1 of 4)]
[im 1/97]
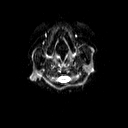
[im 17/97]
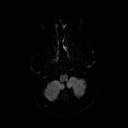
[im 33/97]
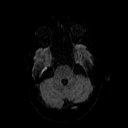
[im 49/97]
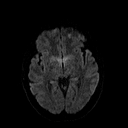
[im 65/97]
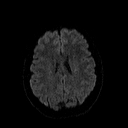
[im 81/97]
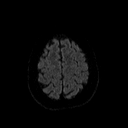
[im 97/97]
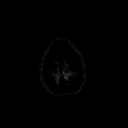

[Series 4: DWI · axial · 3.0mm · 1.80mm/px · z∈[-71,+76]mm · 3 of 50 slices shown (2 of 4)]
[im 1/50]
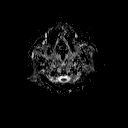
[im 25/50]
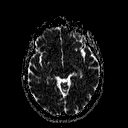
[im 50/50]
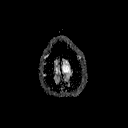

[Series 5: DWI · coronal · 5.0mm · 1.80mm/px · 5 of 68 slices shown (3 of 4)]
[im 1/68]
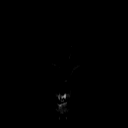
[im 17/68]
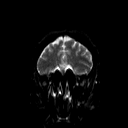
[im 34/68]
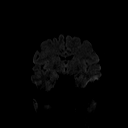
[im 51/68]
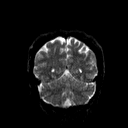
[im 68/68]
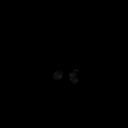

[Series 6: DWI · coronal · 5.0mm · 1.80mm/px · 2 of 34 slices shown (4 of 4)]
[im 1/34]
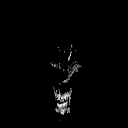
[im 34/34]
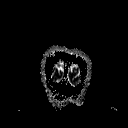

[Series 7: T2 · axial · 5.0mm · 0.60mm/px · z∈[-69,+72]mm · 2 of 22 slices shown (1 of 2)]
[im 1/22]
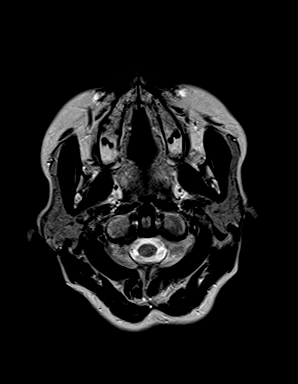
[im 22/22]
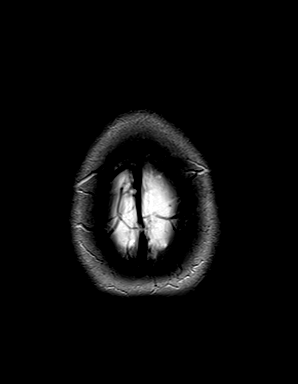

[Series 8: FLAIR · axial · 3.0mm · 0.45mm/px · z∈[-65,+70]mm · 2 of 30 slices shown]
[im 1/30]
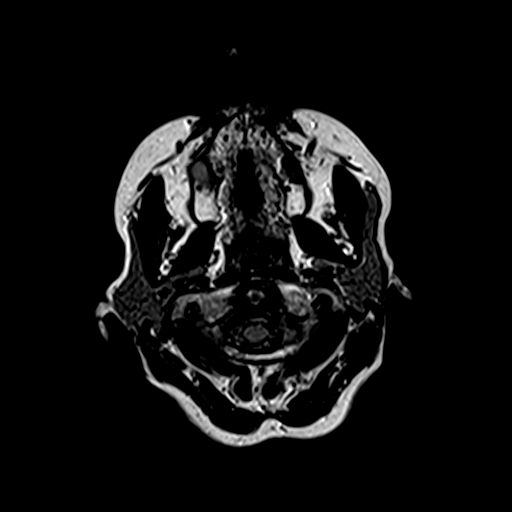
[im 30/30]
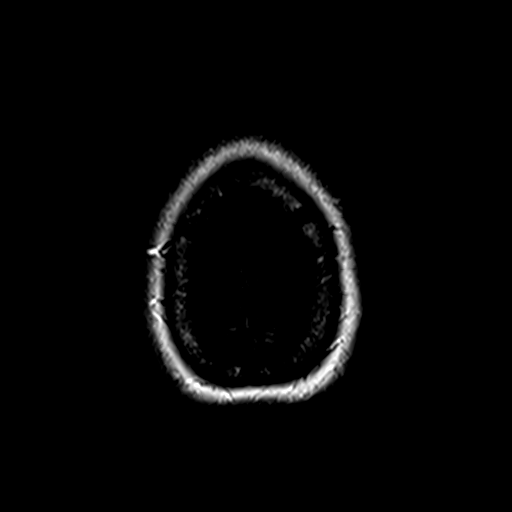

[Series 10: swi_images · axial · 4.0mm · 0.90mm/px · z∈[-68,+72]mm · 2 of 36 slices shown]
[im 1/36]
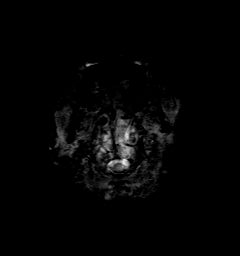
[im 36/36]
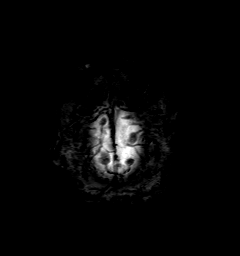

[Series 11: t1_mpr_tra · axial · 1.0mm · 0.75mm/px · z∈[-70,+73]mm · 10 of 144 slices shown]
[im 1/144]
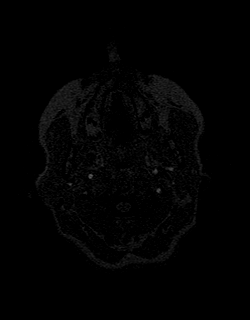
[im 16/144]
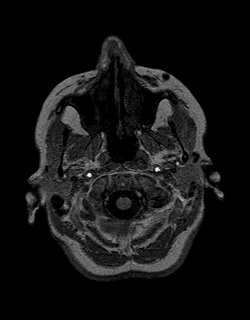
[im 32/144]
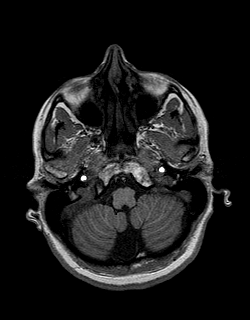
[im 48/144]
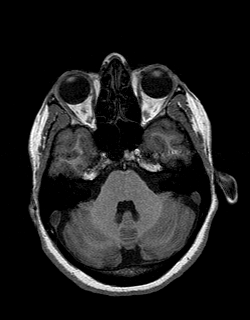
[im 64/144]
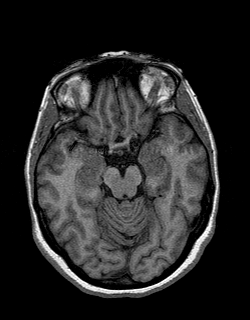
[im 80/144]
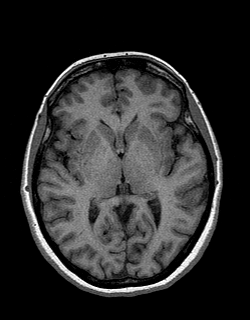
[im 96/144]
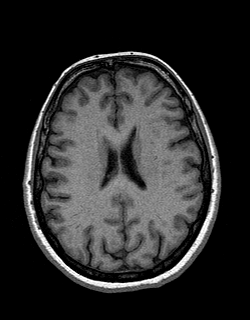
[im 112/144]
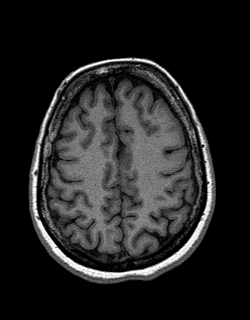
[im 128/144]
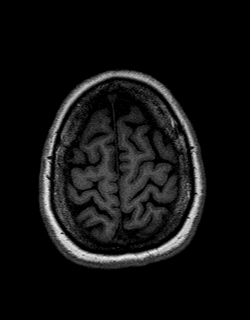
[im 144/144]
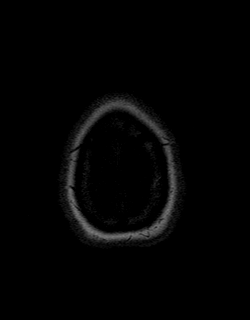

[Series 12: T2 · coronal · 5.0mm · 0.45mm/px · 2 of 25 slices shown (2 of 2)]
[im 1/25]
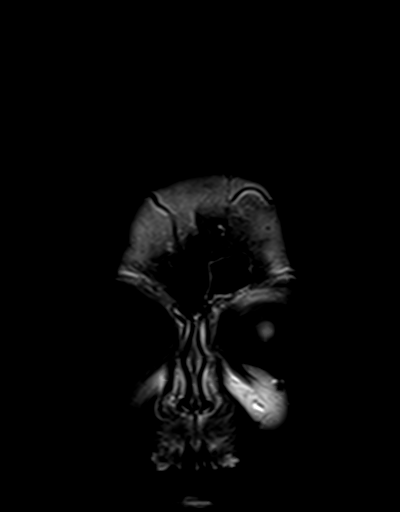
[im 25/25]
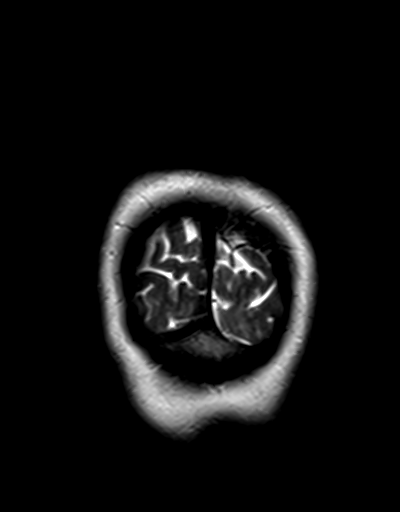

[Series 13: t1_mpr_tra post · axial · 1.0mm · 0.75mm/px · z∈[-70,+73]mm · 10 of 144 slices shown]
[im 1/144]
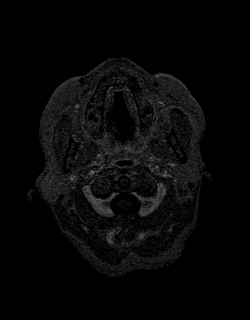
[im 16/144]
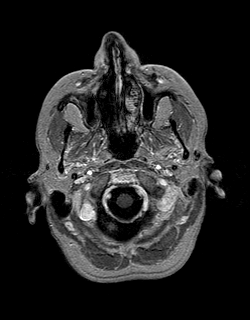
[im 32/144]
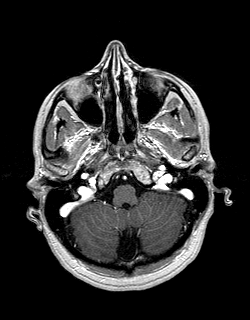
[im 48/144]
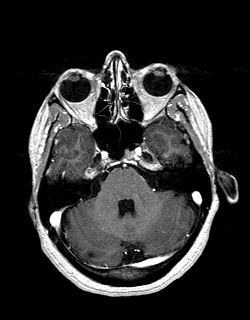
[im 64/144]
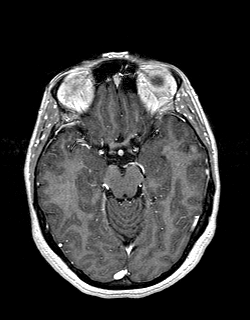
[im 80/144]
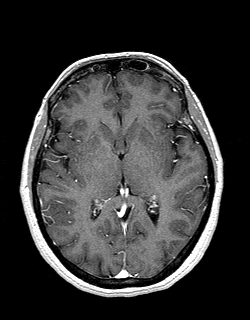
[im 96/144]
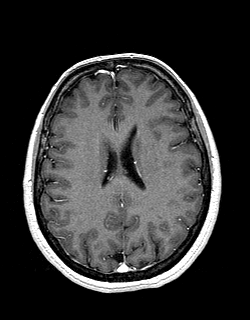
[im 112/144]
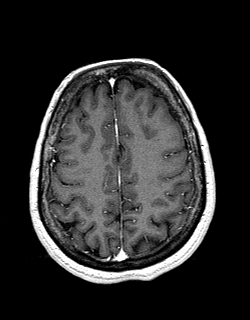
[im 128/144]
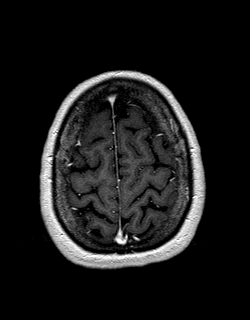
[im 144/144]
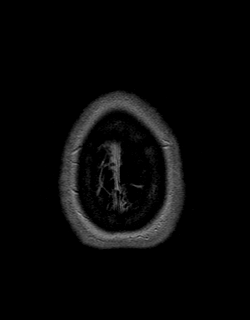

[Series 14: post cor · coronal · 5.0mm · 0.45mm/px · 2 of 25 slices shown]
[im 1/25]
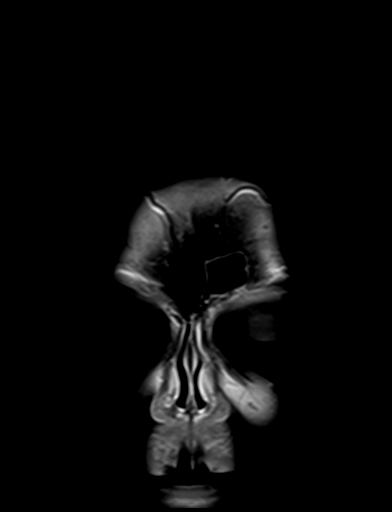
[im 25/25]
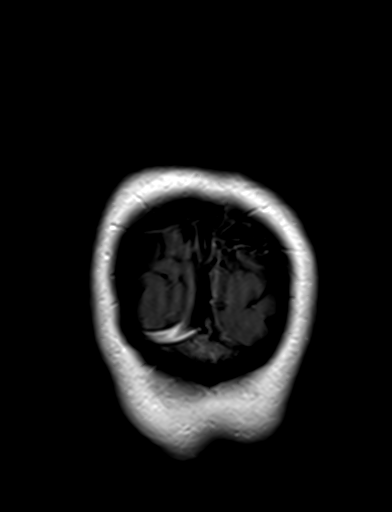

[48 of 48 positions shown; findings below may reference images not displayed]

FINDINGS: Brain:

Mild cerebral atrophy.

3 mm chronic infarct within the right cerebellar hemisphere (series
7, image 8).

No other focal parenchymal signal abnormality is identified.

There is no acute infarct.

No evidence of intracranial mass.

No chronic intracranial blood products.

No extra-axial fluid collection.

No midline shift.

No abnormal intracranial enhancement.

Vascular: Expected proximal arterial flow voids. Small left frontal
lobe developmental venous anomaly (an anatomic variant).

Skull and upper cervical spine: No focal marrow lesion.

Sinuses/Orbits: Visualized orbits show no acute finding. Trace
bilateral frontal and ethmoid sinus mucosal thickening. Mild mucosal
thickening and small fluid levels within the bilateral maxillary
sinuses.
IMPRESSION: No evidence of acute intracranial abnormality.

3 mm chronic infarct within the right cerebellar hemisphere.

Mild cerebral atrophy.

Paranasal sinus disease. Most notably, mild mucosal thickening and
small fluid levels are present within the maxillary sinuses
bilaterally. Correlate for acute sinusitis.

## 2020-12-31 MED ORDER — GADOBENATE DIMEGLUMINE 529 MG/ML IV SOLN
12.0000 mL | Freq: Once | INTRAVENOUS | Status: AC | PRN
  Administered 2020-12-31: 12 mL via INTRAVENOUS

## 2021-01-20 DIAGNOSIS — N898 Other specified noninflammatory disorders of vagina: Secondary | ICD-10-CM

## 2021-01-20 DIAGNOSIS — N920 Excessive and frequent menstruation with regular cycle: Secondary | ICD-10-CM

## 2021-01-20 DIAGNOSIS — N760 Acute vaginitis: Secondary | ICD-10-CM

## 2021-01-20 HISTORY — DX: Excessive and frequent menstruation with regular cycle: N92.0

## 2021-01-20 HISTORY — DX: Other specified noninflammatory disorders of vagina: N89.8

## 2021-01-20 HISTORY — DX: Acute vaginitis: N76.0

## 2021-01-21 ENCOUNTER — Encounter: Payer: Self-pay | Admitting: Podiatry

## 2021-01-21 ENCOUNTER — Ambulatory Visit: Payer: 59 | Admitting: Podiatry

## 2021-01-21 ENCOUNTER — Other Ambulatory Visit: Payer: Self-pay

## 2021-01-21 ENCOUNTER — Other Ambulatory Visit: Payer: Self-pay | Admitting: Podiatry

## 2021-01-21 ENCOUNTER — Ambulatory Visit (INDEPENDENT_AMBULATORY_CARE_PROVIDER_SITE_OTHER): Payer: 59

## 2021-01-21 DIAGNOSIS — M79672 Pain in left foot: Secondary | ICD-10-CM | POA: Diagnosis not present

## 2021-01-21 DIAGNOSIS — L84 Corns and callosities: Secondary | ICD-10-CM

## 2021-01-21 DIAGNOSIS — M79671 Pain in right foot: Secondary | ICD-10-CM

## 2021-01-21 DIAGNOSIS — M7750 Other enthesopathy of unspecified foot: Secondary | ICD-10-CM

## 2021-01-21 DIAGNOSIS — M2042 Other hammer toe(s) (acquired), left foot: Secondary | ICD-10-CM

## 2021-01-21 DIAGNOSIS — M21622 Bunionette of left foot: Secondary | ICD-10-CM

## 2021-01-21 DIAGNOSIS — M21621 Bunionette of right foot: Secondary | ICD-10-CM

## 2021-01-21 DIAGNOSIS — M2041 Other hammer toe(s) (acquired), right foot: Secondary | ICD-10-CM

## 2021-01-21 NOTE — Patient Instructions (Addendum)
Look for silicone toe caps or toe spreaders or Tailor's bunion pads on [place] for urea 40% cream or ointment and apply to the thickened dry skin / calluses. This can be bought over the counter, at a pharmacy or online such as [employer].   More silicone pads can be purchased from:  [email or web address]

## 2021-01-21 NOTE — Progress Notes (Signed)
  Subjective:  Patient ID: Valerie Richards, female    DOB: 05/13/1971,  MRN: [id number]  Chief Complaint  Patient presents with  . Foot Problem    Bilateral foot pains-corns/callouses-also reports bilateral lateral bones becoming irritated, possible bunions.     50 y.o. female presents with the above complaint. History confirmed with patient.   Objective:  Physical Exam: warm, good capillary refill, no trophic changes or ulcerative lesions, normal DP and PT pulses and normal sensory exam.  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  Mild tailor's bunion.  Submetatarsal to callus on the right Radiographs: X-ray of both feet: no fracture, dislocation, swelling or degenerative changes noted bilateral tailor's bunion, prominent spurring at distal phalanx bilaterally of toes a Assessment:   1. Foot pain, bilateral      Plan:  Patient was evaluated and treated and all questions answered.  Reviewed the etiology and treatment options including surgical knots or treatment for both tailor's bunions, hammertoes and the interdigital corns with the spurs.  I think she should proceed with nonsurgical treatment currently.  Advised to avoid corn rubber pads as this tends to be a caustic type thing that is harmful for skin.  Dispensed a silicone toe spreader gel pad and she continue to use these as she can get over-the-counter on [employer] or [employer] gels.  Return if not improving and or interested in surgical correction, I think excision of the spurs and corns could offer some relief but the chance of recurrence is high  Return if symptoms worsen or fail to improve.

## 2021-01-29 ENCOUNTER — Ambulatory Visit: Payer: 59 | Admitting: Nurse Practitioner

## 2021-02-09 ENCOUNTER — Encounter: Payer: 59 | Admitting: Gastroenterology

## 2021-05-13 ENCOUNTER — Encounter: Payer: Self-pay | Admitting: Obstetrics and Gynecology

## 2021-05-13 ENCOUNTER — Other Ambulatory Visit: Payer: Self-pay

## 2021-05-13 ENCOUNTER — Other Ambulatory Visit (HOSPITAL_COMMUNITY)
Admission: RE | Admit: 2021-05-13 | Discharge: 2021-05-13 | Disposition: A | Discharge status: Home / Self Care | Payer: 59 | Source: Ambulatory Visit | Attending: Obstetrics and Gynecology | Admitting: Obstetrics and Gynecology

## 2021-05-13 ENCOUNTER — Ambulatory Visit: Payer: 59 | Admitting: Obstetrics and Gynecology

## 2021-05-13 VITALS — BP 100/64 | HR 69 | Ht 65.0 in

## 2021-05-13 DIAGNOSIS — N898 Other specified noninflammatory disorders of vagina: Secondary | ICD-10-CM | POA: Insufficient documentation

## 2021-05-13 DIAGNOSIS — R079 Chest pain, unspecified: Secondary | ICD-10-CM | POA: Diagnosis not present

## 2021-05-13 DIAGNOSIS — N951 Menopausal and female climacteric states: Secondary | ICD-10-CM | POA: Diagnosis not present

## 2021-05-13 DIAGNOSIS — K5909 Other constipation: Secondary | ICD-10-CM

## 2021-05-13 DIAGNOSIS — Z8673 Personal history of transient ischemic attack (TIA), and cerebral infarction without residual deficits: Secondary | ICD-10-CM | POA: Diagnosis not present

## 2021-05-13 DIAGNOSIS — D219 Benign neoplasm of connective and other soft tissue, unspecified: Secondary | ICD-10-CM

## 2021-05-13 MED ORDER — TRIAMCINOLONE ACETONIDE 0.1 % EX OINT: TOPICAL_OINTMENT | CUTANEOUS | 1 refills | Status: DC

## 2021-05-13 NOTE — Patient Instructions (Signed)
Please stop your hormone treatment due to your history of stroke noted on your brain MRI.  I have made a referral for you to see a neurologist.   Try Valerie Richards for an herbal option to treat your hot flashes.

## 2021-05-13 NOTE — Progress Notes (Signed)
GYNECOLOGY  VISIT   HPI: 50 y.o.   Widowed  [place]  female   [id number] with Patient's last menstrual period was 02/26/2020 (exact date).   here for vaginal discharge and itching.  No bleeding.   Having pain and dryness.  Panty liners cause itching.  She uses them due to moisture.   Wants a refill of Triamcinolone for her chronic vulvitis.   Patient is on HRT.  She has not used Prometrium for 2 months.  She is still using the transdermal estrogen.  Her hot flashes are improved.   She is not sleeping as well due to multiple issues.  She is having difficulty getting an appointment with her PCP.  She had headaches and she was discovered to have had a stroke on an MRI of brain 12/31/20.  Report reviewed in Epic. She states she has not gotten any education about this.   She has chest pain and left arm pain.  She takes a baby ASA daily.  Chronic constipation.  Normal colonoscopy 3 months ago.   She has a know fibroid uterus.  GYNECOLOGIC HISTORY: Patient's last menstrual period was 02/26/2020 (exact date). Contraception: Abstinence Menopausal hormone therapy:  none Last mammogram: 03-05-20 Neg/BiRads1 Last pap smear:     11/17/17 Neg:Neg HR HPV       OB History     Gravida  5   Para  5   Term  5   Preterm      AB      Living  5      SAB      IAB      Ectopic      Multiple  1   Live Births                 Patient Active Problem List   Diagnosis Date Noted   Menorrhagia 01/20/2021   Vaginal discharge 01/20/2021   Vaginitis and vulvovaginitis 01/20/2021   Severe episode of recurrent major depressive disorder ([place]) 09/26/2019   Acute recurrent ethmoidal sinusitis 09/03/2018   Nasal congestion 09/03/2018   Nasal pain 09/03/2018   Nonintractable headache 09/03/2018   Arthralgia of both hands 02/28/2018   Uterine leiomyoma 11/30/2017   Symptomatic anemia 08/02/2016    Past Medical History:  Diagnosis Date   Anemia    iron deficiency   Fibroid     Stroke ([place]) 12/2020   See MRI.    Past Surgical History:  Procedure Laterality Date   EYE SURGERY Bilateral    TONSILLECTOMY      Current Outpatient Medications  Medication Sig Dispense Refill   aspirin 81 MG EC tablet Take by mouth.     buPROPion (WELLBUTRIN XL) 150 MG 24 hr tablet Take 150 mg by mouth at bedtime.     cyclobenzaprine (FLEXERIL) 10 MG tablet TAKE 1 TABLET BY MOUTH EVERY 8 HOURS AS NEEDED FOR SPASM     meloxicam (MOBIC) 15 MG tablet Take 1 tablet by mouth daily.     meloxicam (MOBIC) 7.5 MG tablet meloxicam 7.5 mg tablet     omeprazole (PRILOSEC) 40 MG capsule Take 1 capsule (40 mg total) by mouth every morning. 90 capsule 3   ondansetron (ZOFRAN) 8 MG tablet ondansetron HCl 8 mg tablet     Psyllium 0.36 g CAPS Take 1 capsule daily x7 days, then increase to BID     sertraline (ZOLOFT) 100 MG tablet Take 1 tablet by mouth every morning.     sertraline (ZOLOFT) 50 MG tablet  Take 50 mg by mouth daily.     topiramate (TOPAMAX) 25 MG tablet Take 1 tablet at bedtime for one week, then increase to 2 tablets at bedtime 60 tablet 0   traZODone (DESYREL) 100 MG tablet      triamcinolone ointment (KENALOG) 0.1 % APPLY SMALL AMOUNT OF OINTMENT TOPICALLY TWICE DAILY FOR 14 DAYS THEN TWICE WEEKLY     zolpidem (AMBIEN) 5 MG tablet Take 5 mg by mouth at bedtime as needed.     No current facility-administered medications for this visit.     ALLERGIES: Patient has no known allergies.  Family History  Problem Relation Age of Onset   Hypertension Mother    Cancer Father    Diabetes Father    Colon cancer Father    Kidney failure Father    Diabetes Brother     Social History   Socioeconomic History   Marital status: Widowed    Spouse name: Not on file   Number of children: Not on file   Years of education: Not on file   Highest education level: Not on file  Occupational History   Occupation: Scientist, water quality    Comment: [employer]  Tobacco Use   Smoking  status: Former    Packs/day: 0.25    Years: 8.00    Pack years: 2.00    Types: Cigarettes    Quit date: 10/10/2012    Years since quitting: 8.5   Smokeless tobacco: Never  Vaping Use   Vaping Use: Never used  Substance and Sexual Activity   Alcohol use: No    Alcohol/week: 0.0 standard drinks   Drug use: No   Sexual activity: Not Currently    Birth control/protection: Post-menopausal  Other Topics Concern   Not on file  Social History Narrative   Widowed Dec 08, 2015. Husband died from [place] disease.    Right handed drinks caffeine   2 story home   Social Determinants of Health   Financial Resource Strain: Not on file  Food Insecurity: Not on file  Transportation Needs: Not on file  Physical Activity: Not on file  Stress: Not on file  Social Connections: Not on file  Intimate Partner Violence: Not on file    Review of Systems  All other systems reviewed and are negative.  PHYSICAL EXAMINATION:    BP 100/64   Pulse 69   Ht '5\' 5"'$  (1.651 m)   LMP 02/26/2020 (Exact Date)   SpO2 99%   BMI 22.49 kg/m     General appearance: alert, cooperative and appears stated age   Pelvic: External genitalia:  no lesions              Urethra:  normal appearing urethra with no masses, tenderness or lesions              Bartholins and Skenes: normal                 Vagina: normal appearing vagina with normal color and discharge, no lesions              Cervix: no lesions                Bimanual Exam:  Uterus:  14 week size.               Adnexa: no mass, fullness, tenderness          Chaperone was present for exam:  Sharee Pimple, RN  ASSESSMENT  Vaginal discharge.  Hx vulvitis.  Hx stroke.  HRT for treatment of menopausal symptoms.  Chest pain.  Chronic constipation.  Fibroid uterus.   PLAN  Vaginitis testing.  Refill of triamcinolone ointment.  Stop all HRT now.  I discussed risk of stroke, MI, and blood clots with use of HRT.  I recommended Estroven for menopausal  symptoms.  We briefly discussed Neurontin for night time hot flashes, but I do not recommend this at this time.  Referral to neurology.  She will see her GI to discuss ongoing constipation.  I recommend hospital evaluation for chest pain.  FU prn.    An After Visit Summary was printed and given to the patient.

## 2021-05-14 ENCOUNTER — Other Ambulatory Visit: Payer: Self-pay | Admitting: Obstetrics and Gynecology

## 2021-05-14 ENCOUNTER — Ambulatory Visit: Payer: 59

## 2021-05-14 DIAGNOSIS — Z1231 Encounter for screening mammogram for malignant neoplasm of breast: Secondary | ICD-10-CM

## 2021-05-14 LAB — CERVICOVAGINAL ANCILLARY ONLY
Bacterial Vaginitis (gardnerella): NEGATIVE
Candida Glabrata: NEGATIVE
Candida Vaginitis: NEGATIVE
Comment: NEGATIVE
Comment: NEGATIVE
Comment: NEGATIVE
Comment: NEGATIVE
Trichomonas: NEGATIVE

## 2021-05-19 ENCOUNTER — Ambulatory Visit
Admission: RE | Admit: 2021-05-19 | Discharge: 2021-05-19 | Disposition: A | Discharge status: Home / Self Care | Payer: 59 | Source: Ambulatory Visit | Attending: Obstetrics and Gynecology | Admitting: Obstetrics and Gynecology

## 2021-05-19 ENCOUNTER — Other Ambulatory Visit: Payer: Self-pay

## 2021-05-19 ENCOUNTER — Telehealth: Payer: Self-pay

## 2021-05-19 DIAGNOSIS — Z1231 Encounter for screening mammogram for malignant neoplasm of breast: Secondary | ICD-10-CM

## 2021-05-19 IMAGING — MG MM DIGITAL SCREENING BILAT W/ TOMO AND CAD
8 series · 9 of 24 positions shown · non-contrast
Comparison: Previous exam(s).

CLINICAL DATA: Screening.

EXAM:
DIGITAL SCREENING BILATERAL MAMMOGRAM WITH TOMOSYNTHESIS AND CAD
TECHNIQUE: Bilateral screening digital craniocaudal and mediolateral oblique
mammograms were obtained. Bilateral screening digital breast
tomosynthesis was performed. The images were evaluated with
computer-aided detection.

[R CC synth-2D]
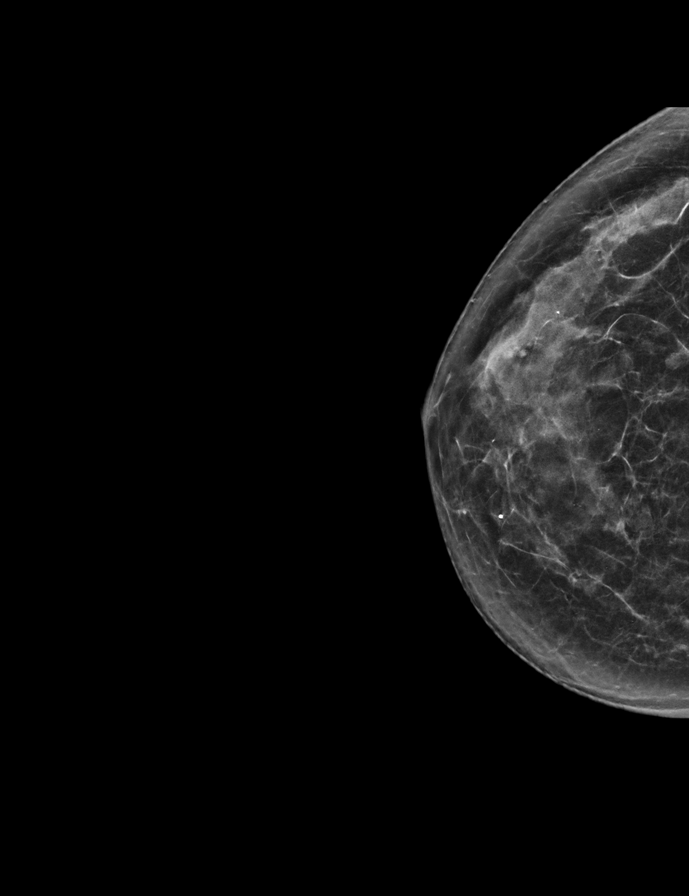

[R MLO synth-2D]
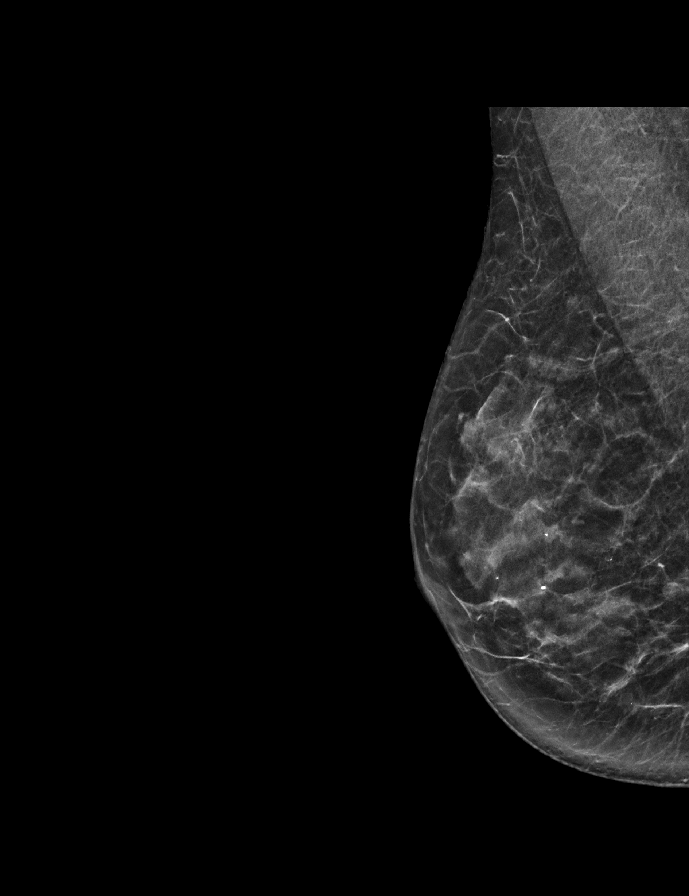

[L MLO synth-2D]
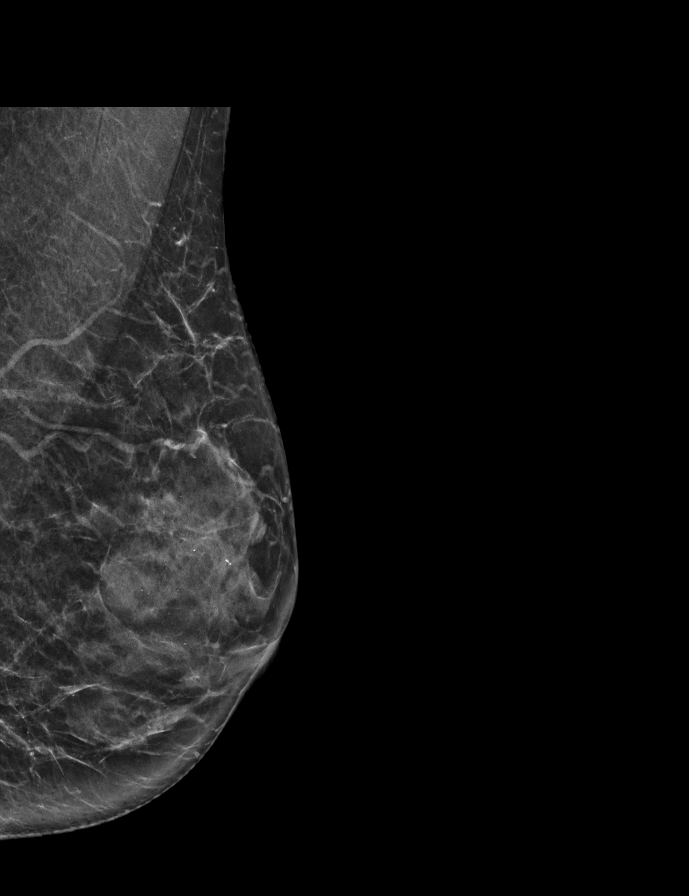

[L CC synth-2D]
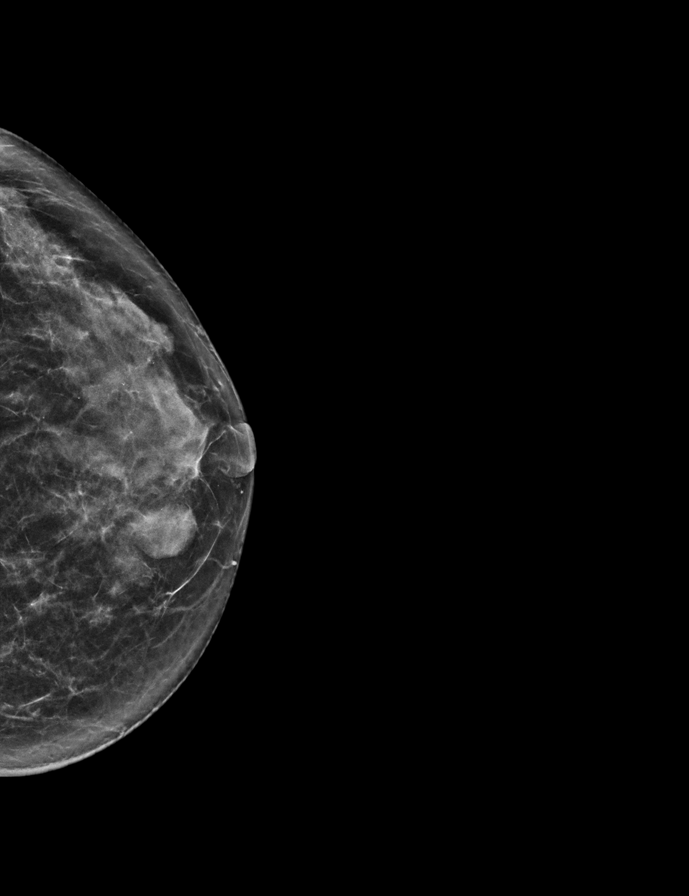

[L MLO tomo · 2 of 56 frames shown]
[frame 19/56]
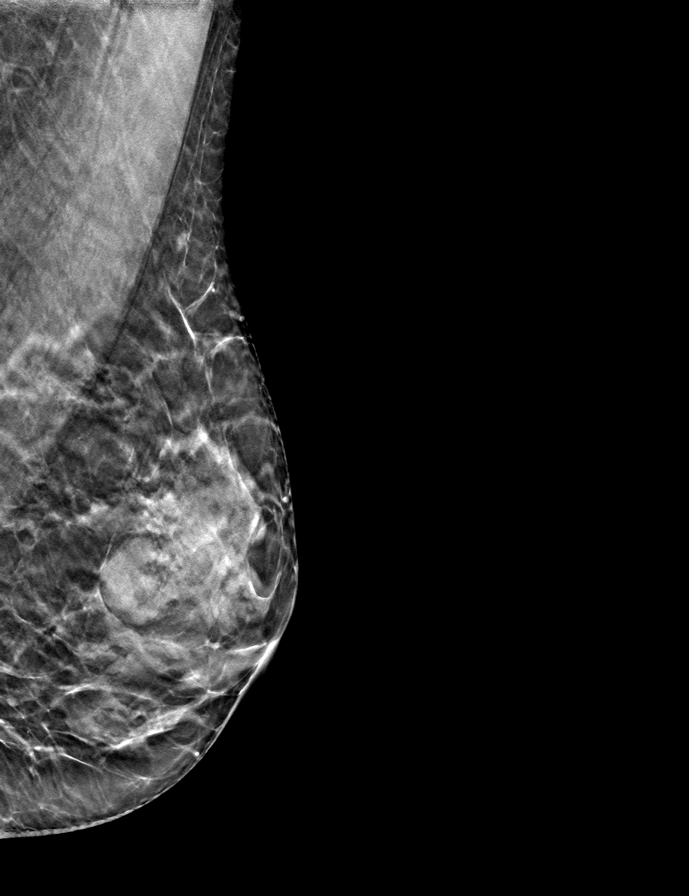
[frame 29/56]
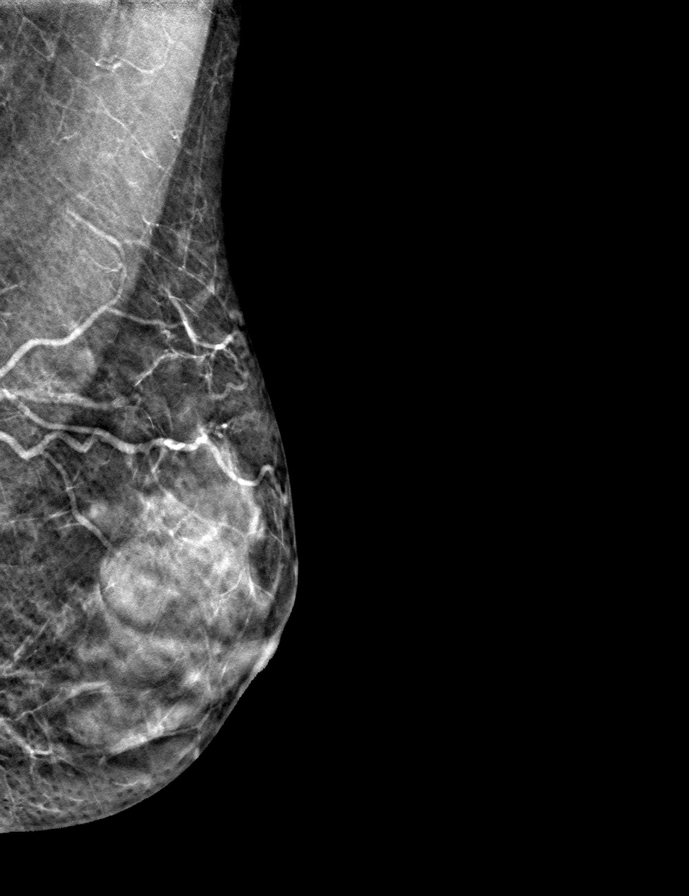

[R MLO tomo · tomo slice 27/54.0]
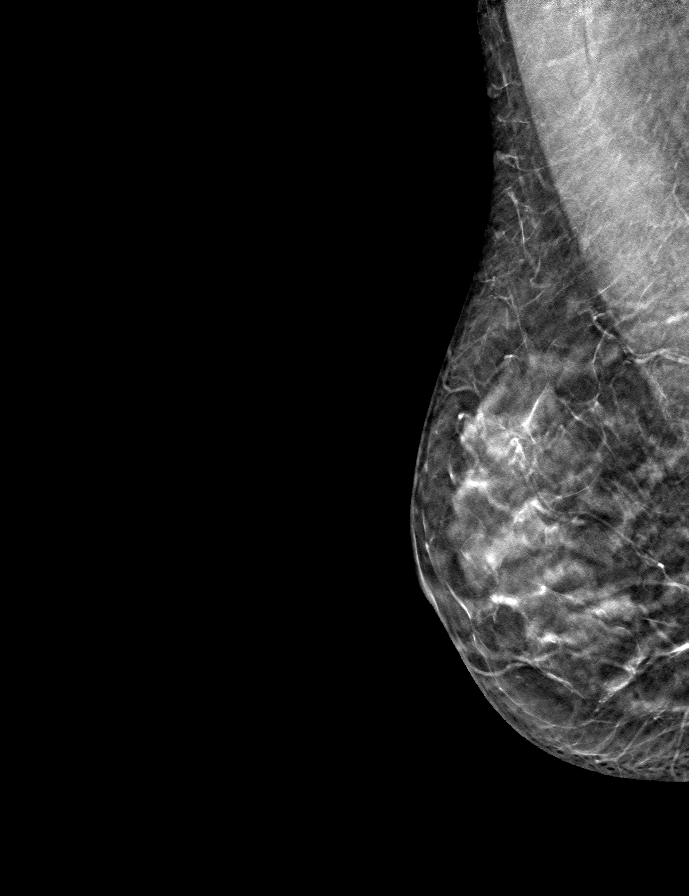

[R CC tomo · tomo slice 29/56.0]
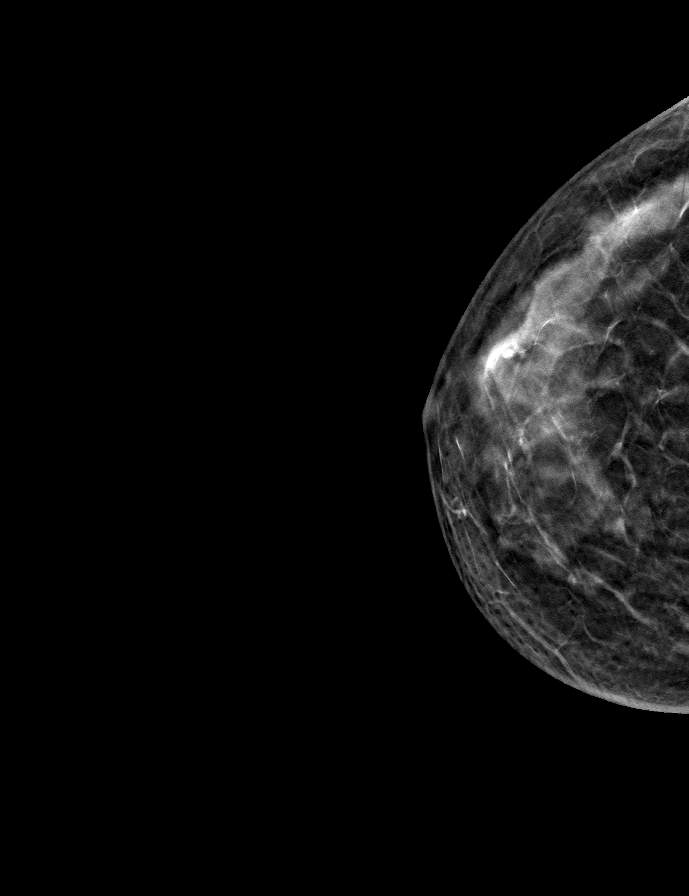

[L CC tomo · tomo slice 27/53.0]
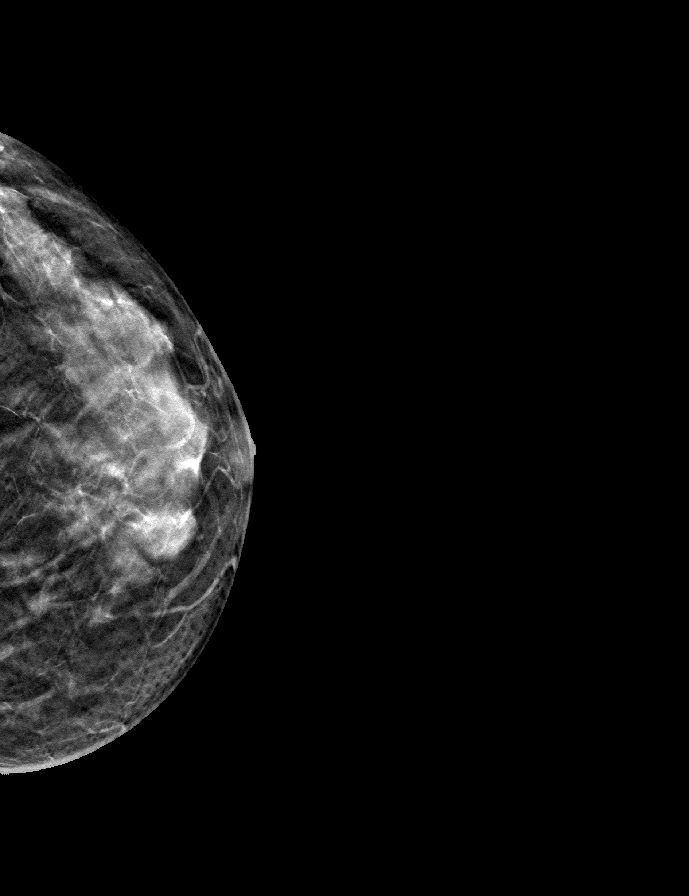

[9 of 24 positions shown; findings below may reference images not displayed]

ACR Breast Density Category c: The breast tissue is heterogeneously
dense, which may obscure small masses.
FINDINGS: In the right breast, a possible mass warrants further evaluation. In
the left breast, no findings suspicious for malignancy.
IMPRESSION: Further evaluation is suggested for a possible mass in the right
breast.

RECOMMENDATION:
Diagnostic mammogram and possibly ultrasound of the right breast.
(Code:[PA])

The patient will be contacted regarding the findings, and additional
imaging will be scheduled.

BI-RADS CATEGORY  0: Incomplete. Need additional imaging evaluation
and/or prior mammograms for comparison.

## 2021-05-19 NOTE — Telephone Encounter (Signed)
If her itching is external, I recommend she use the Trimacinolone ointment twice a day for 1 week.  If the itching is internal, I recommend she try some Monistat 3 for possible yeast infection.   If her symptoms persist, I recommend an office visit for re-evaluation and possible vaginal estrogen therapy.

## 2021-05-19 NOTE — Telephone Encounter (Signed)
Patient was in on 05/13/21 with vaginal discharge and itching. Vaginitis panel was negative.  She called today because she is experiencing itching and burning.  Wants to see what you recommend.

## 2021-05-19 NOTE — Telephone Encounter (Signed)
Advised patient of Dr. Elza Rafter recommendations. She said her sx are internal and she declines using Monistat stating it has not helped her previously. She wants to move directly to a visit with Dr. Quincy Simmonds to re-evaluate. Message sent to appt desk pool to schedule visit.

## 2021-05-21 ENCOUNTER — Ambulatory Visit (INDEPENDENT_AMBULATORY_CARE_PROVIDER_SITE_OTHER): Payer: 59 | Admitting: Obstetrics & Gynecology

## 2021-05-21 ENCOUNTER — Other Ambulatory Visit: Payer: Self-pay

## 2021-05-21 ENCOUNTER — Encounter: Payer: Self-pay | Admitting: Obstetrics & Gynecology

## 2021-05-21 VITALS — BP 106/66

## 2021-05-21 DIAGNOSIS — N898 Other specified noninflammatory disorders of vagina: Secondary | ICD-10-CM | POA: Diagnosis not present

## 2021-05-21 LAB — WET PREP FOR TRICH, YEAST, CLUE

## 2021-05-21 NOTE — Progress Notes (Signed)
    Valerie Richards [birth year] [id number]        50 y.o.  [id number]   RP: Vulvo-vaginal irritation with discharge  HPI: Continued Vulvo-vaginal irritation with discharge.  Chronic vulvitis on Triamcinolone.  Seen by Dr Quincy Simmonds on 05/13/2021 for the same problem.  Wet prep Neg on 05/13/2021.  Postmenopause, stopped the Estradiol patch 05/13/2021 given chest pain/Left arm pain, Cardiac investigation organized.  Patient had stopped Prometrium 2 months earlier.  No PMB.  No pelvic pain.  Urine/BMs normal.   OB History  Gravida Para Term Preterm AB Living  '5 5 5     5  '$ SAB IAB Ectopic Multiple Live Births        1      # Outcome Date GA Lbr Len/2nd Weight Sex Delivery Anes PTL Lv  5 Term           4 Term           3 Term           2 Term           1 Term             Past medical history,surgical history, problem list, medications, allergies, family history and social history were all reviewed and documented in the EPIC chart.   Directed ROS with pertinent positives and negatives documented in the history of present illness/assessment and plan.  Exam:  Vitals:   05/21/21 1159  BP: 106/66   General appearance:  Normal  Abdomen: Normal  Gynecologic exam: Vulva normal.  Speculum:  Cervix/Vagina normal.  Normal vaginal secretions.  Wet prep done.  Wet prep Negative   Assessment/Plan:  50 y.o. [id number]   1. Vaginal discharge Normal vulva/vagina with no sign of inflammation or infection.  Wet prep Negative again.  Patient reassured.  Wanted to restart on Estrogen therapy, recommended patient not to given her high cardiovascular risks.  Will continue on Triamcinolone.  Recommend trying Boric Acid OTC.  Warm soaking in clean bath/Sitz bath. - WET PREP FOR TRICH, YEAST, CLUE   Princess Bruins MD, 12:16 PM 05/21/2021

## 2021-05-24 ENCOUNTER — Other Ambulatory Visit: Payer: Self-pay | Admitting: Obstetrics and Gynecology

## 2021-05-24 DIAGNOSIS — R928 Other abnormal and inconclusive findings on diagnostic imaging of breast: Secondary | ICD-10-CM

## 2021-06-07 ENCOUNTER — Other Ambulatory Visit: Payer: Self-pay | Admitting: Obstetrics and Gynecology

## 2021-06-07 ENCOUNTER — Ambulatory Visit
Admission: RE | Admit: 2021-06-07 | Discharge: 2021-06-07 | Disposition: A | Discharge status: Home / Self Care | Payer: 59 | Source: Ambulatory Visit | Attending: Obstetrics and Gynecology | Admitting: Obstetrics and Gynecology

## 2021-06-07 ENCOUNTER — Other Ambulatory Visit: Payer: Self-pay

## 2021-06-07 DIAGNOSIS — R928 Other abnormal and inconclusive findings on diagnostic imaging of breast: Secondary | ICD-10-CM

## 2021-06-07 IMAGING — US US BREAST*R* LIMITED INC AXILLA
1 series · 13 of 22 positions shown · non-contrast
Comparison: Previous exam(s).

CLINICAL DATA: 50-year-old female presenting as a recall from
screening for possible right breast mass.

EXAM:
DIGITAL DIAGNOSTIC UNILATERAL RIGHT MAMMOGRAM WITH TOMOSYNTHESIS AND
CAD; ULTRASOUND RIGHT BREAST LIMITED
TECHNIQUE: Right digital diagnostic mammography and breast tomosynthesis was
performed. The images were evaluated with computer-aided detection.;
Targeted ultrasound examination of the right breast was performed

[Series 1: us breast*right* limited inc axilla · 0.05mm/px · 13 of 22 slices shown]
[im 1/22]
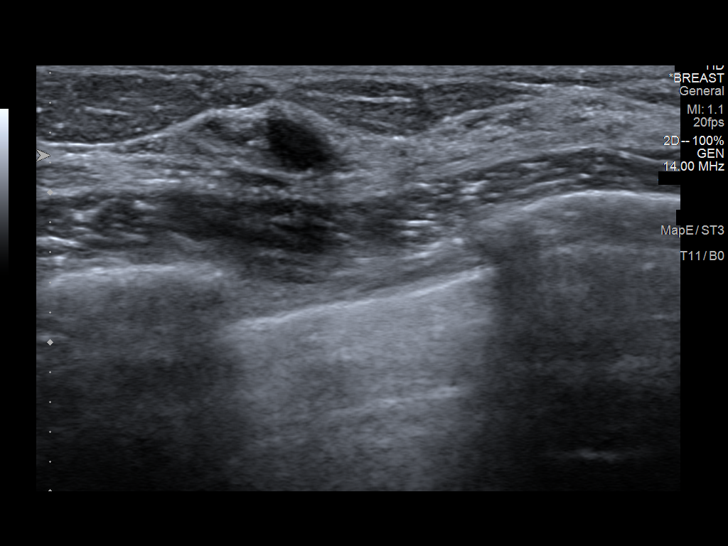
[im 3/22]
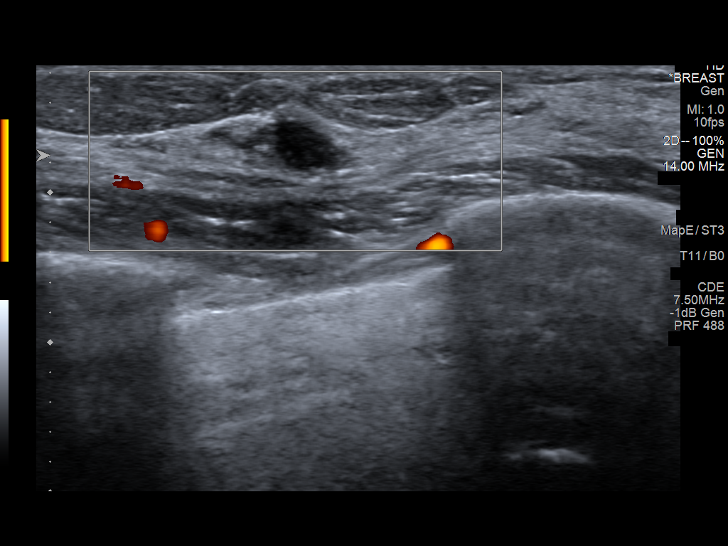
[im 5/22]
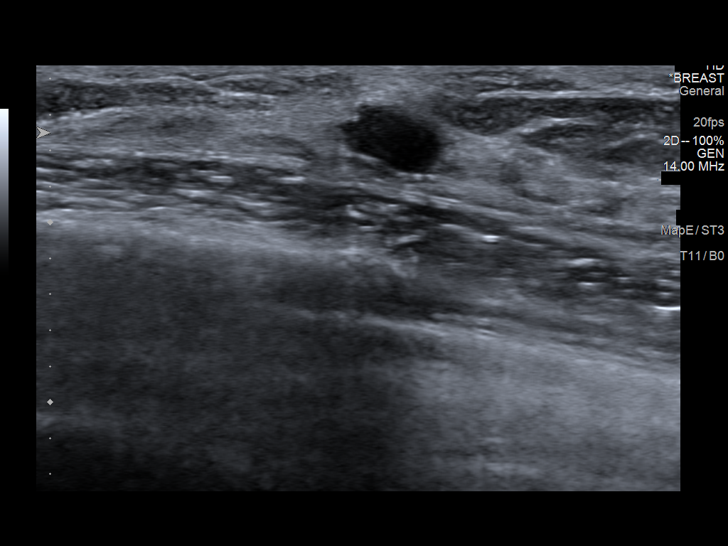
[im 6/22]
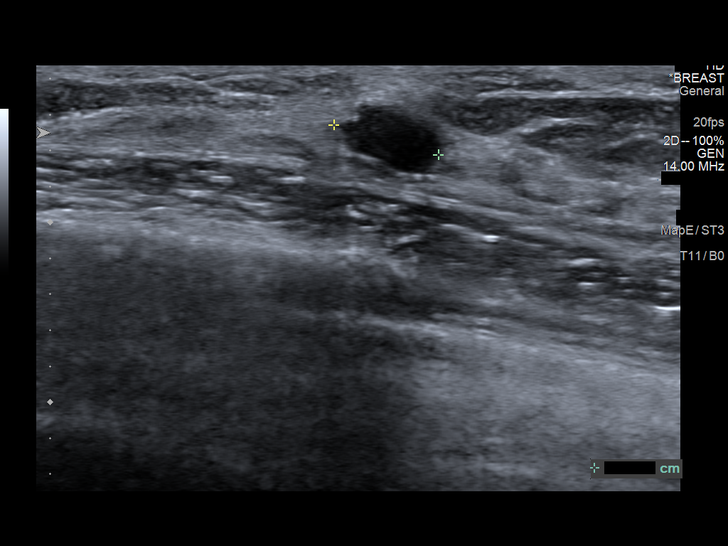
[im 8/22]
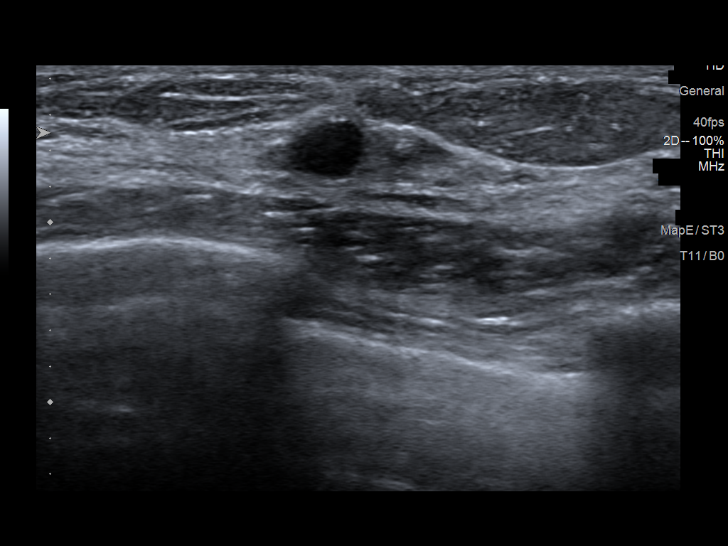
[im 10/22]
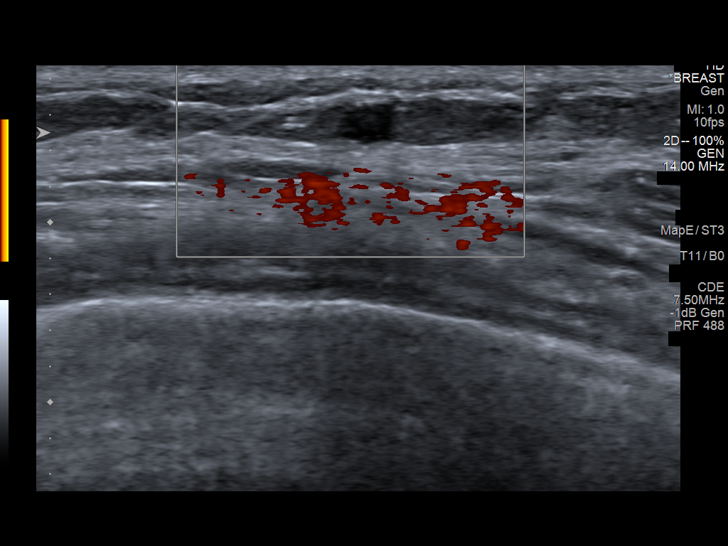
[im 12/22]
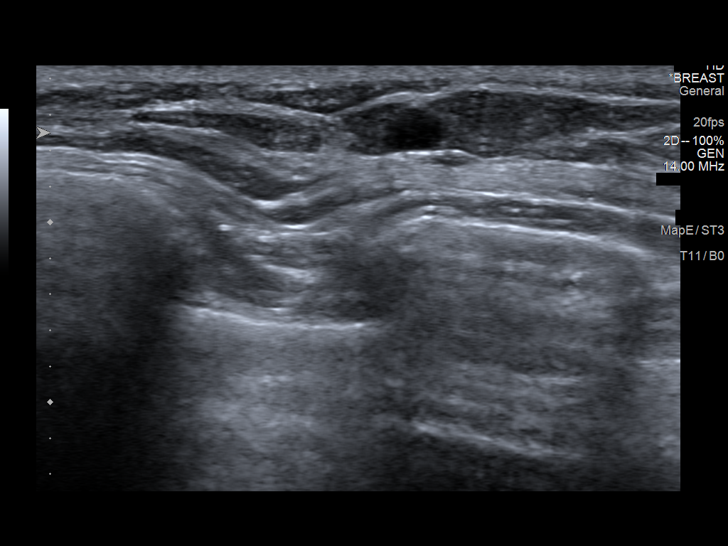
[im 13/22]
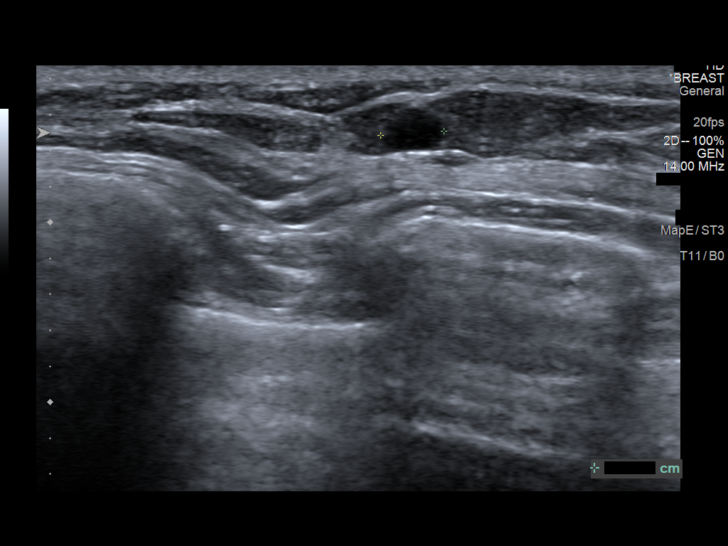
[im 15/22]
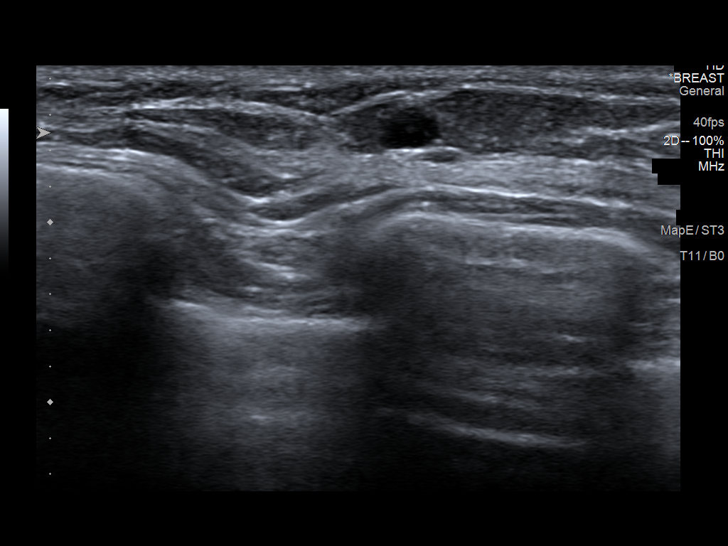
[im 17/22]
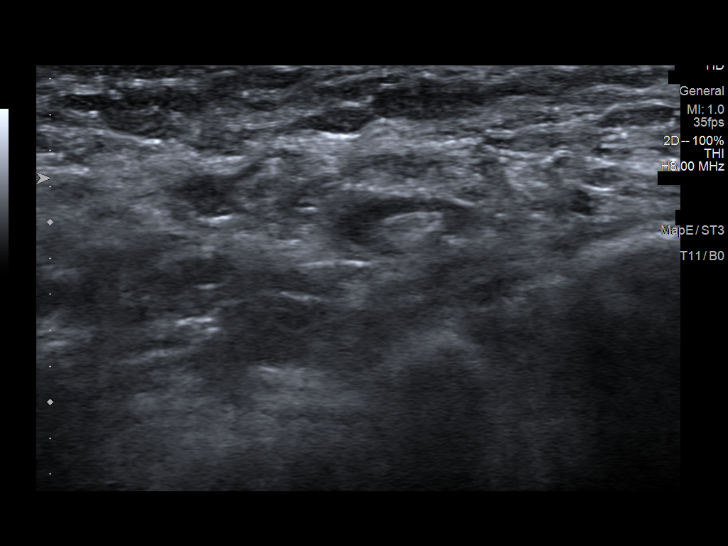
[im 18/22]
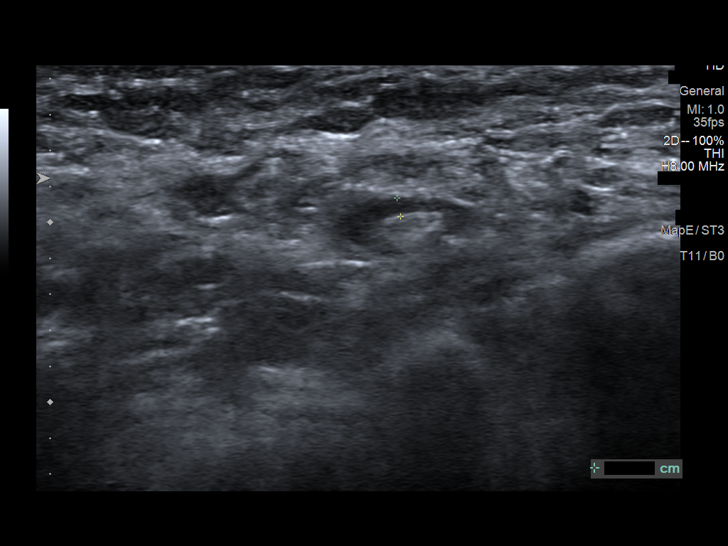
[im 20/22]
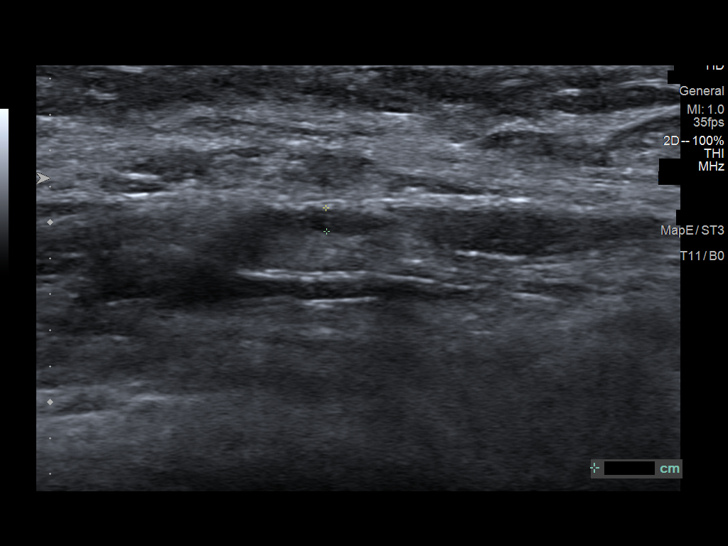
[im 22/22]
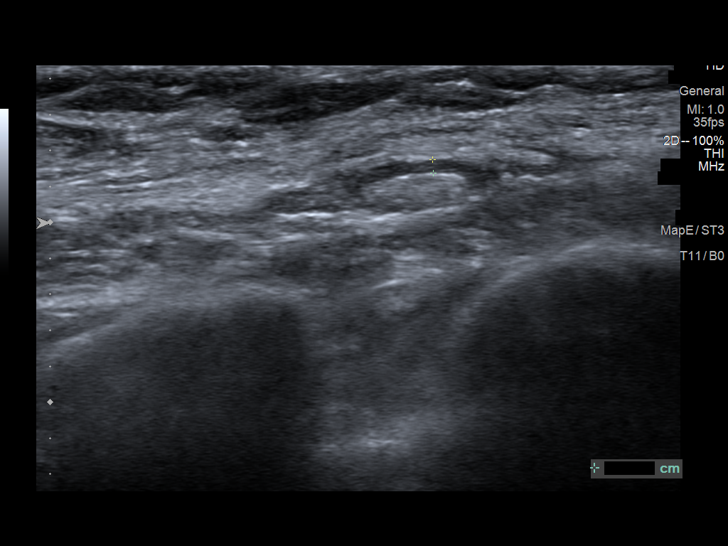

[13 of 22 positions shown; findings below may reference images not displayed]

ACR Breast Density Category c: The breast tissue is heterogeneously
dense, which may obscure small masses.
FINDINGS: Mammogram:

Spot compression tomosynthesis views of the right breast were
performed demonstrating persistence of an oval circumscribed mass in
the lower outer right breast measuring approximately 0.4 cm.

Ultrasound:

Targeted ultrasound performed in the right breast at 8 o'clock 4 cm
from the nipple demonstrating an oval circumscribed mass with
internal echoes measuring 0.3 x 0.3 x 0.4 cm, likely a complicated
cyst.

There is an additional similar appearing mass incidentally noted at
10 o'clock 3 cm from the nipple measuring 0.6 x 0.3 x 0.4 cm.

Targeted ultrasound of the right axilla demonstrates normal lymph
nodes.
IMPRESSION: Probably benign masses in the right breast at 8 o'clock and 10
o'clock, likely complicated cysts.

RECOMMENDATION:
Right breast ultrasound in 6 months.

I have discussed the findings and recommendations with the patient
who agrees to short-term follow-up.

BI-RADS CATEGORY  3: Probably benign.

## 2021-06-07 IMAGING — MG MM DIGITAL DIAGNOSTIC UNILAT*R* W/ TOMO W/ CAD
4 series · 4 of 12 positions shown · non-contrast
Comparison: Previous exam(s).

CLINICAL DATA: 50-year-old female presenting as a recall from
screening for possible right breast mass.

EXAM:
DIGITAL DIAGNOSTIC UNILATERAL RIGHT MAMMOGRAM WITH TOMOSYNTHESIS AND
CAD; ULTRASOUND RIGHT BREAST LIMITED
TECHNIQUE: Right digital diagnostic mammography and breast tomosynthesis was
performed. The images were evaluated with computer-aided detection.;
Targeted ultrasound examination of the right breast was performed

[R MLO synth-2D]
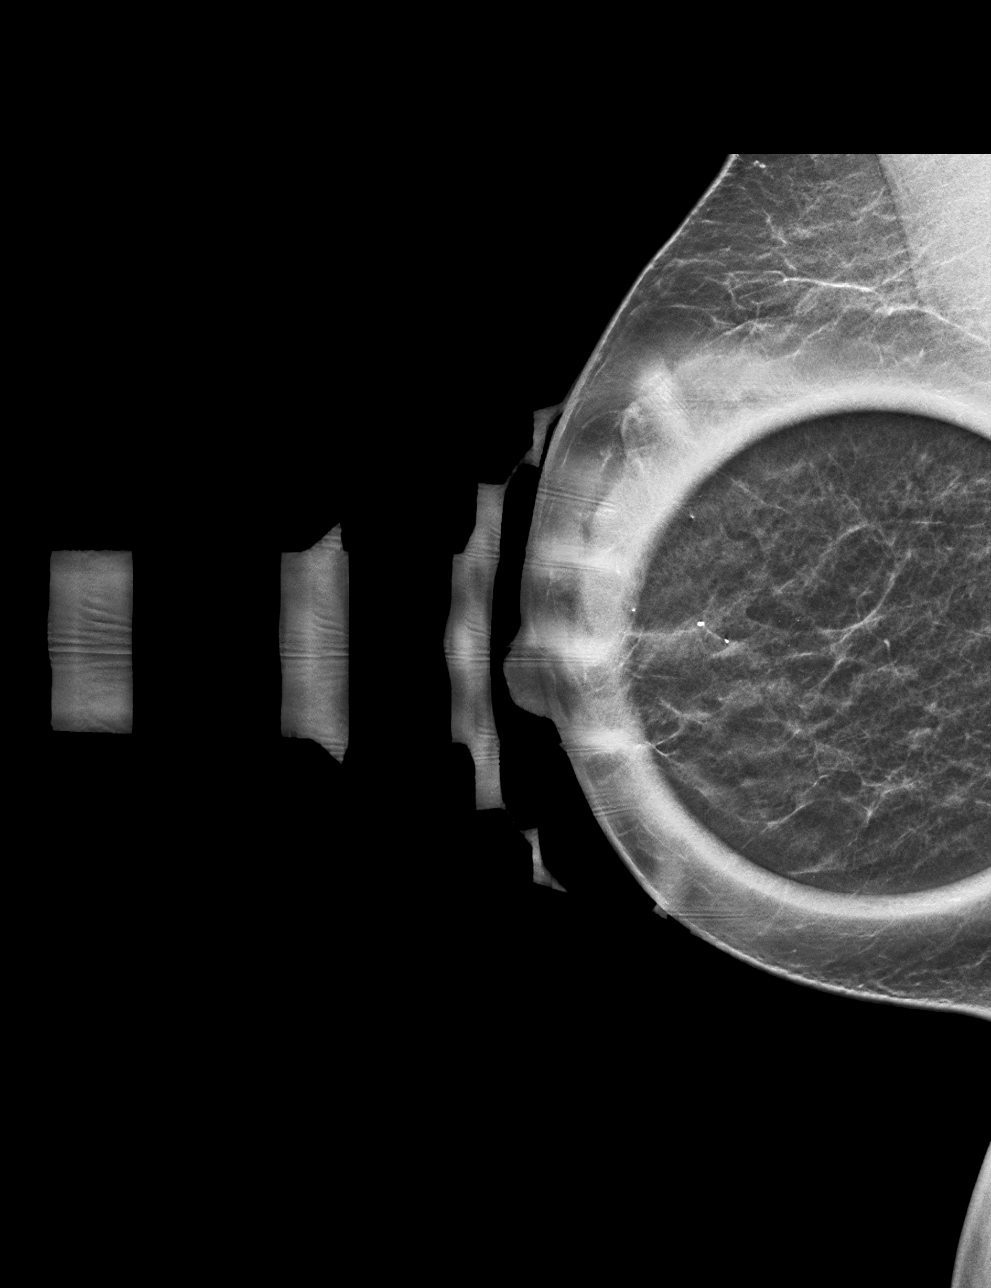

[R CC synth-2D]
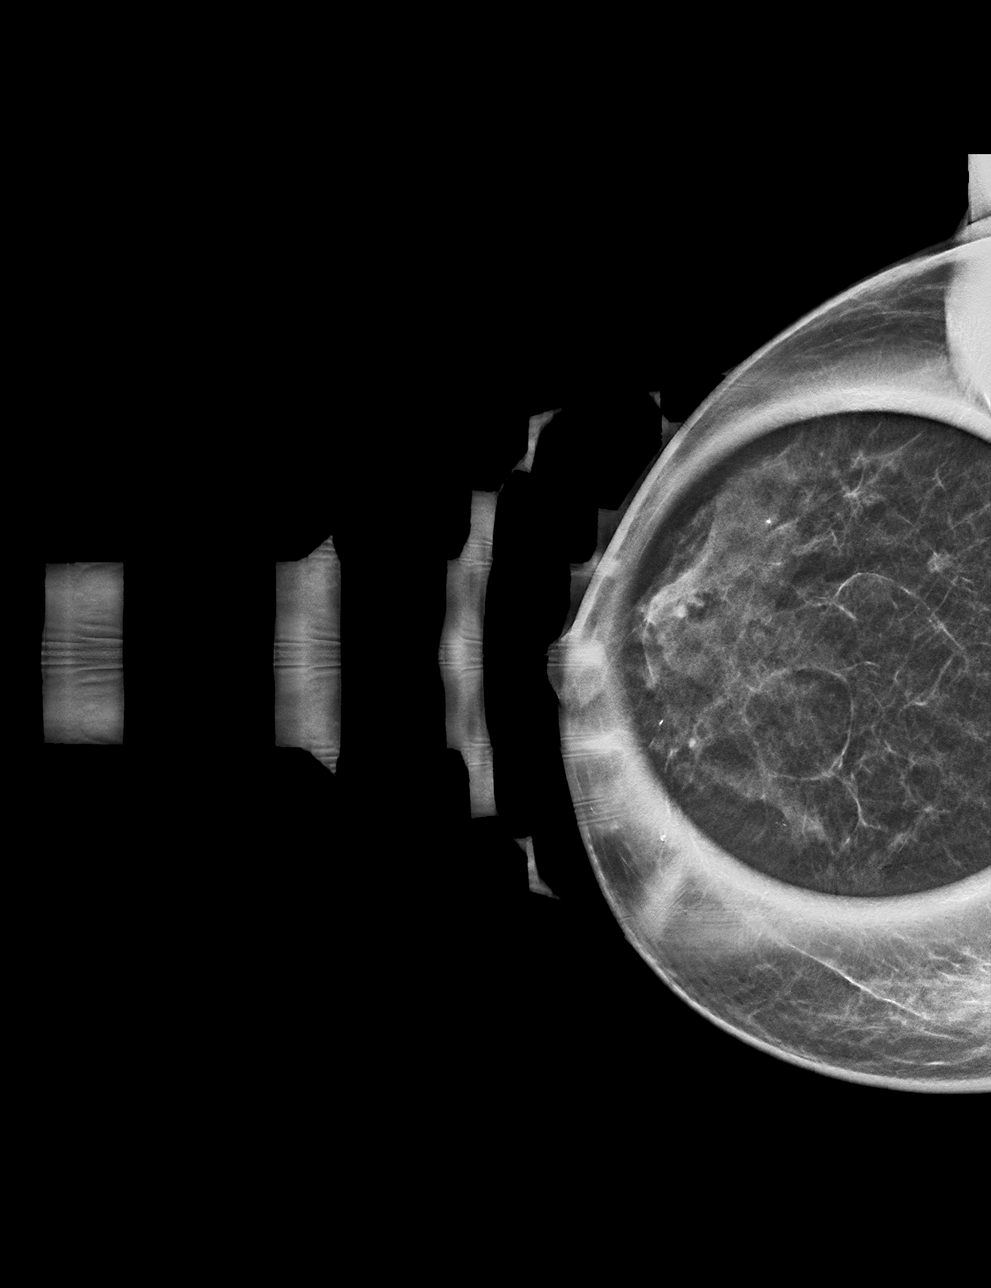

[R CC tomo · tomo slice 19/36.0]
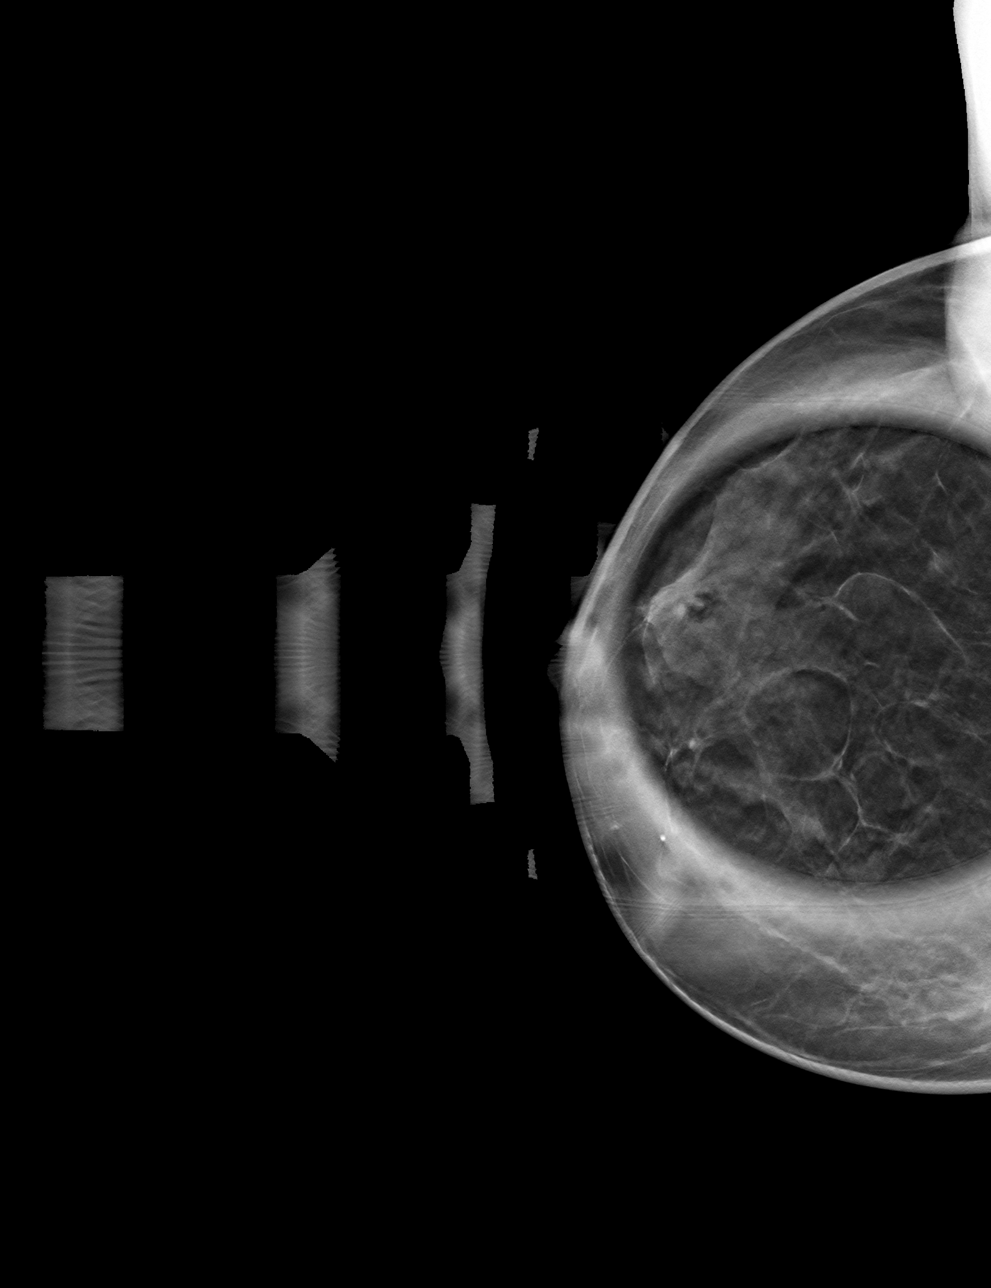

[R MLO tomo · tomo slice 17/33.0]
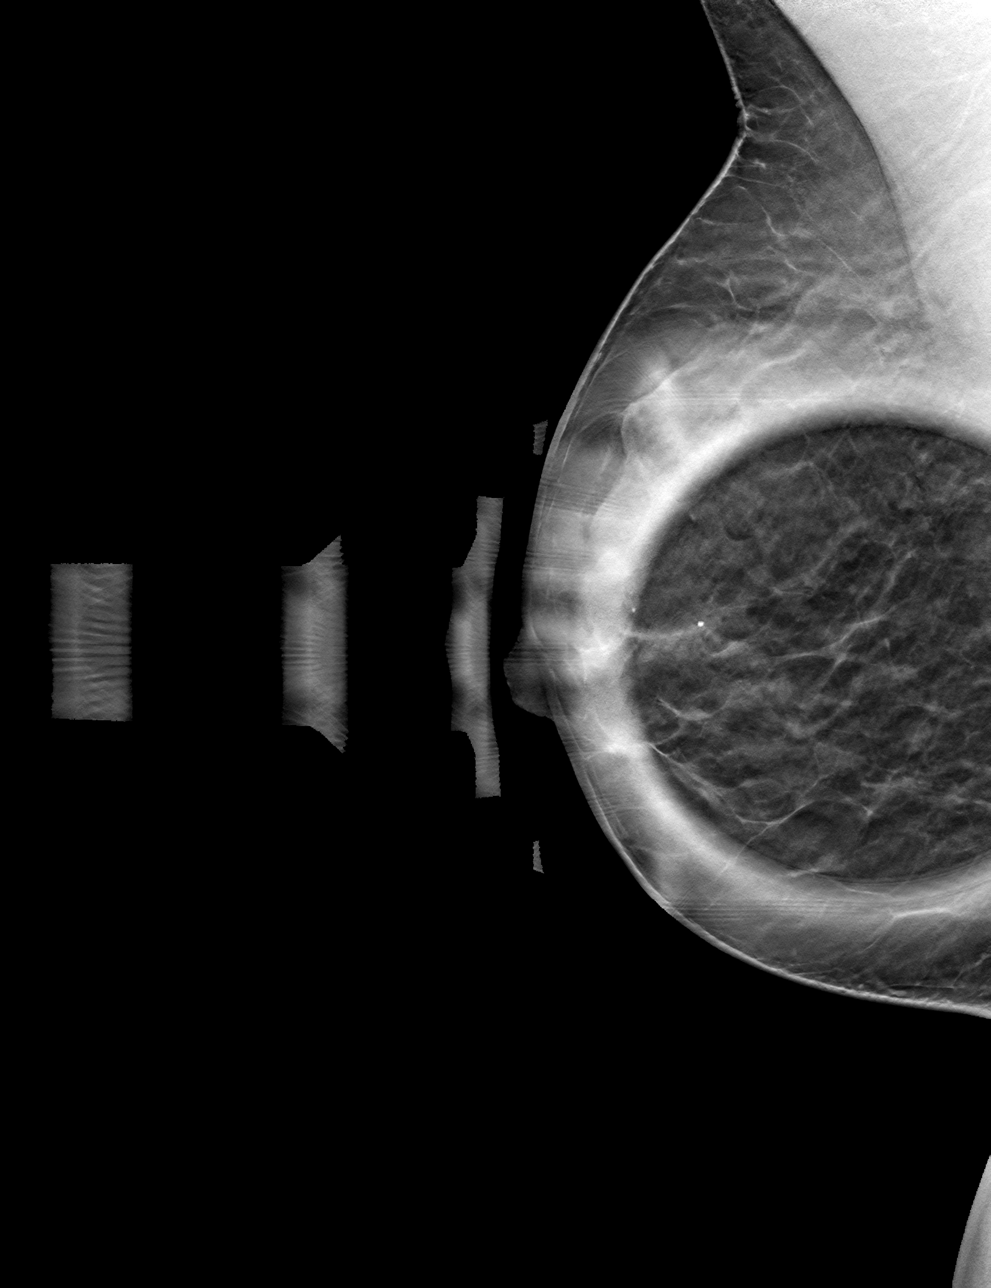

[4 of 12 positions shown; findings below may reference images not displayed]

ACR Breast Density Category c: The breast tissue is heterogeneously
dense, which may obscure small masses.
FINDINGS: Mammogram:

Spot compression tomosynthesis views of the right breast were
performed demonstrating persistence of an oval circumscribed mass in
the lower outer right breast measuring approximately 0.4 cm.

Ultrasound:

Targeted ultrasound performed in the right breast at 8 o'clock 4 cm
from the nipple demonstrating an oval circumscribed mass with
internal echoes measuring 0.3 x 0.3 x 0.4 cm, likely a complicated
cyst.

There is an additional similar appearing mass incidentally noted at
10 o'clock 3 cm from the nipple measuring 0.6 x 0.3 x 0.4 cm.

Targeted ultrasound of the right axilla demonstrates normal lymph
nodes.
IMPRESSION: Probably benign masses in the right breast at 8 o'clock and 10
o'clock, likely complicated cysts.

RECOMMENDATION:
Right breast ultrasound in 6 months.

I have discussed the findings and recommendations with the patient
who agrees to short-term follow-up.

BI-RADS CATEGORY  3: Probably benign.

## 2021-06-21 NOTE — Progress Notes (Signed)
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 Prior cervical X-ray showed degenerative changes. Dizziness - unclear etiology.  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  Due to small size and absence of otherwise any cerebrovascular disease, I questioned whether it is actually a perivascular CSF space.  I discussed with neuro-radiology who believed that although possible, a small infarct is still favored.  Regardless, it would not be the cause for dizziness Left leg pain - describes pain in knee and ankle suggestive or orthopedic etiology.    Migraine prevention:  Increase topiramate to '100mg'$  at bedtime.  If no improvement in 6 weeks, would start a CGRP inhibitor.  Already on 2 antidepressants.  I wouldn't start a beta blocker as she has low blood pressure at baseline (90s/60s). Migraine rescue:  Nurtec with Zofran ODT '8mg'$ .  Stop Tylenol.  Discussed risk of rebound headache. As she failed physical therapy, will get MRI of cervical spine. Keep headache diary Recommended ASA '81mg'$  daily.  She said it caused bruising.  I wouldn't push her to take it given that it is what I believe to be an incidental finding and otherwise without risk factors.  Recommend that PCP make sure that stroke risk factors such as cholesterol and blood pressure are controlled. Follow up 6 months.   Subjective:  Valerie Richards is a 50 year old female with history of iron-deficiency anemia who follows up for migraine and dizziness.  UPDATE: MRI brain with and without contrast on 12/31/2020 personally reviewed showed paranasal sinus disease and 3 mm chronic right cerebellar infarct but no acute abnormality.  Advised to start ASA but stopped because it caused bruising.    Started topiramate in  March. Headache is still daily.  They are severe.  It lasts all morning.  Treats with 2 Extra-strength Tylenol.   She reports aching pain on the left side of her neck and arm, sometimes leg.  Also left eye twitches.  Pain in the left knee and ankle.  Reports numbness and tingling in fingers and toes, particularly when she is feeling anxious.  She reports having physical therapy a couple of months ago but neck/arm/back pain persists.    Current medications: Topiramate '50mg'$  QHS Wellbutrin XL sertraline '50mg'$  meloxicam Estradiol, progesterone   HISTORY: Beginning around September 2021, she started having dizziness.  It is positional.  It is a spinning sensation lasting seconds to a couple of minutes.  Associated with blurred vision and sometimes nausea.  Occurs every other day to twice a day.     Around the same time, she developed new onset headaches.  They are severe right frontal pounding headaches associated with nausea, photophobia and sometimes phonophobia.  They sometimes wake her up at night from sleep.  It lasts 2 hours with Tylenol.  They occur every other day.  No known triggers.  However she does have chronic neck pain radiating into the right shoulder.  X-ray cervical spine from 06/17/2020 personally reviewed showed degenerative straightening of the cervical lordosis with focally moderate disc space height loss and osteophytosis of C6-C7.     PAST MEDICAL HISTORY: Past Medical History:  Diagnosis Date   Anemia    iron deficiency   Fibroid    Stroke ([place]) 12/2020   See MRI.    MEDICATIONS: Current Outpatient Medications on File Prior to Visit  Medication Sig Dispense Refill   aspirin 81 MG EC tablet Take by mouth.     buPROPion (WELLBUTRIN XL) 150 MG 24 hr tablet Take 150 mg by mouth at bedtime.     cyclobenzaprine (FLEXERIL) 10 MG tablet TAKE 1 TABLET BY MOUTH EVERY 8 HOURS AS NEEDED FOR SPASM     meloxicam (MOBIC) 15 MG tablet Take 1 tablet by mouth daily.     omeprazole  (PRILOSEC) 40 MG capsule Take 1 capsule (40 mg total) by mouth every morning. 90 capsule 3   ondansetron (ZOFRAN) 8 MG tablet ondansetron HCl 8 mg tablet     Psyllium 0.36 g CAPS Take 1 capsule daily x7 days, then increase to BID     sertraline (ZOLOFT) 100 MG tablet Take 1 tablet by mouth every morning.     topiramate (TOPAMAX) 25 MG tablet Take 1 tablet at bedtime for one week, then increase to 2 tablets at bedtime 60 tablet 0   traZODone (DESYREL) 100 MG tablet      triamcinolone ointment (KENALOG) 0.1 % APPLY SMALL AMOUNT OF OINTMENT TOPICALLY TWICE DAILY FOR 14 DAYS THEN TWICE WEEKLY 30 g 1   zolpidem (AMBIEN) 5 MG tablet Take 5 mg by mouth at bedtime as needed.     No current facility-administered medications on file prior to visit.    ALLERGIES: No Known Allergies  FAMILY HISTORY: Family History  Problem Relation Age of Onset   Hypertension Mother    Cancer Father    Diabetes Father    Colon cancer Father    Kidney failure Father    Diabetes Brother       Objective:  Blood pressure 96/61, pulse 64, height '5\' 4"'$  (1.626 m), weight 131 lb (59.4 kg), last menstrual period 02/26/2020, SpO2 100 %. General: No acute distress.  Patient appears well-groomed.    Valerie Clines, DO  CC: Valerie Cipro, MD

## 2021-06-22 ENCOUNTER — Ambulatory Visit: Payer: 59 | Admitting: Neurology

## 2021-06-22 ENCOUNTER — Encounter: Payer: Self-pay | Admitting: Neurology

## 2021-06-22 ENCOUNTER — Other Ambulatory Visit: Payer: Self-pay

## 2021-06-22 VITALS — BP 96/61 | HR 64 | Ht 64.0 in | Wt 131.0 lb

## 2021-06-22 DIAGNOSIS — G43709 Chronic migraine without aura, not intractable, without status migrainosus: Secondary | ICD-10-CM | POA: Diagnosis not present

## 2021-06-22 DIAGNOSIS — M5412 Radiculopathy, cervical region: Secondary | ICD-10-CM

## 2021-06-22 MED ORDER — TOPIRAMATE 100 MG PO TABS: 100.0000 mg | ORAL_TABLET | Freq: Every day | ORAL | 5 refills | Status: DC

## 2021-06-22 MED ORDER — ONDANSETRON 8 MG PO TBDP: 8.0000 mg | ORAL_TABLET | Freq: Three times a day (TID) | ORAL | 5 refills | Status: DC | PRN

## 2021-06-22 NOTE — Patient Instructions (Addendum)
Increase topiramate to '100mg'$  at bedtime.  If no improvement in 6 weeks, contact me and we will switch to another medication.  Contact your insurance and ask which of the following medications are formulary:  Aimovig, Ajovy, Emgality.  Contact us with their answer Take Nurtec earliest onset of headache.  Maximum 1 in 24 hours.  If effective, contact me for prescription Ondansetron for nausea MRI of cervical spine without contrast Stop TYlenol Follow up 6 months

## 2021-07-07 ENCOUNTER — Other Ambulatory Visit: Payer: Self-pay

## 2021-07-07 ENCOUNTER — Ambulatory Visit
Admission: RE | Admit: 2021-07-07 | Discharge: 2021-07-07 | Disposition: A | Discharge status: Home / Self Care | Payer: 59 | Source: Ambulatory Visit | Attending: Neurology | Admitting: Neurology

## 2021-07-07 DIAGNOSIS — M5412 Radiculopathy, cervical region: Secondary | ICD-10-CM

## 2021-07-07 IMAGING — MR MR CERVICAL SPINE W/O CM
4 of 5 series · 28 of 48 positions shown · non-contrast
Comparison: Radiographs of the cervical spine [DATE].

CLINICAL DATA: Cervical radiculopathy. Cervical radiculopathy, no
red flags. Additional history provided by scanning technologist:
Patient reports neck pain for 7 years.

EXAM:
MRI CERVICAL SPINE WITHOUT CONTRAST
TECHNIQUE: Multiplanar, multisequence MR imaging of the cervical spine was
performed. No intravenous contrast was administered.

[Series 2: T2 · sagittal · 3.0mm · 0.66mm/px · 7 of 15 slices shown (1 of 2)]
[im 1/15]
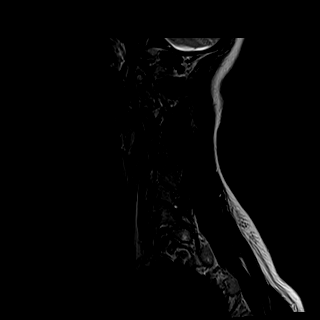
[im 3/15]
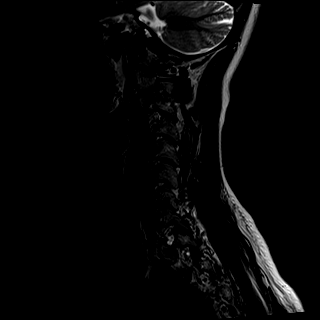
[im 5/15]
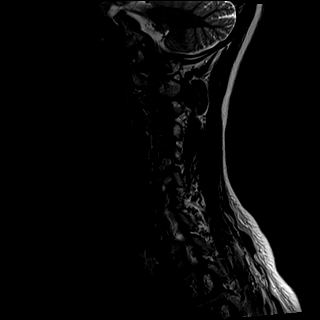
[im 8/15]
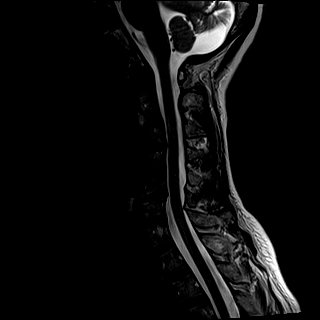
[im 10/15]
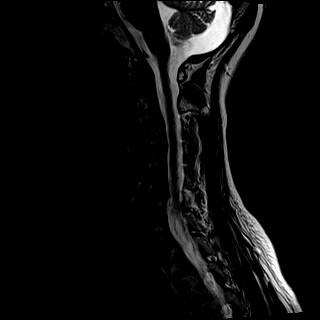
[im 12/15]
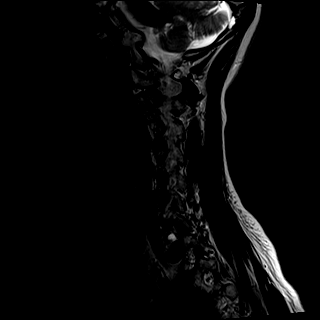
[im 15/15]
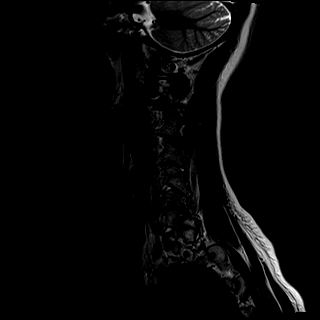

[Series 3: T1 · sagittal · 3.0mm · 0.41mm/px · 7 of 15 slices shown]
[im 1/15]
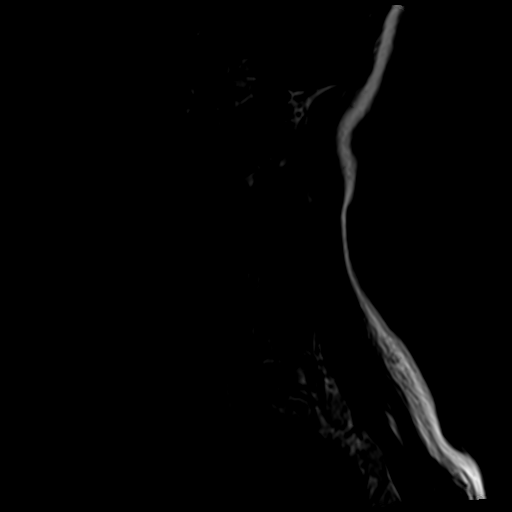
[im 3/15]
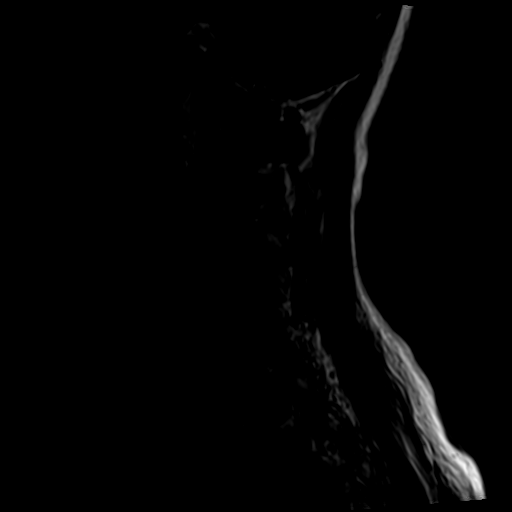
[im 5/15]
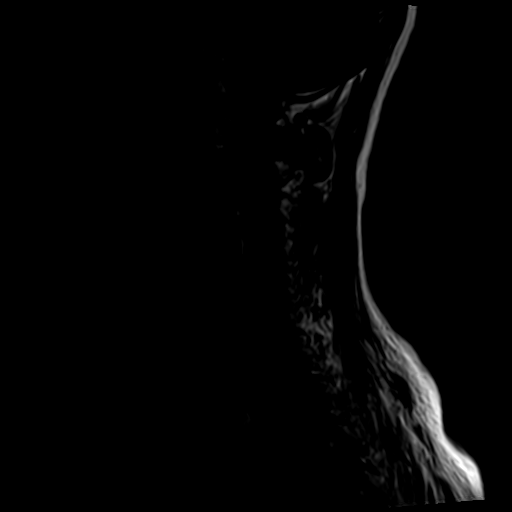
[im 8/15]
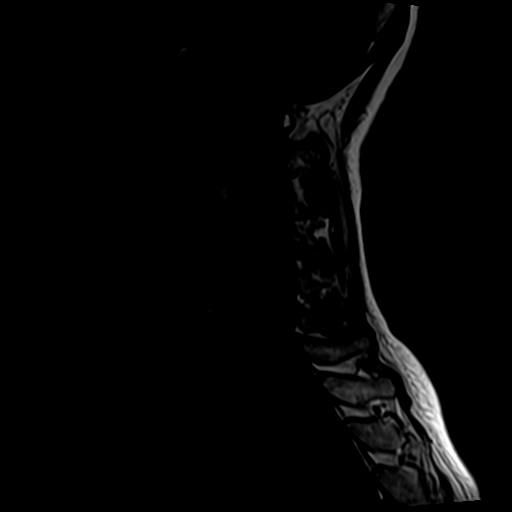
[im 10/15]
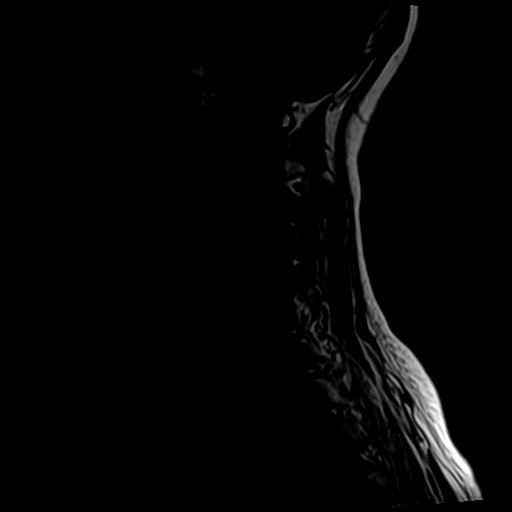
[im 12/15]
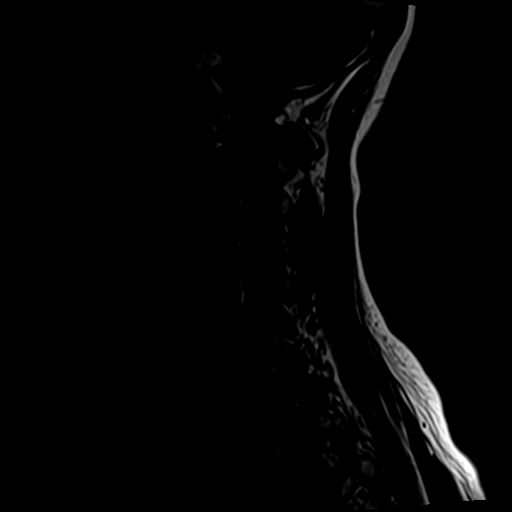
[im 15/15]
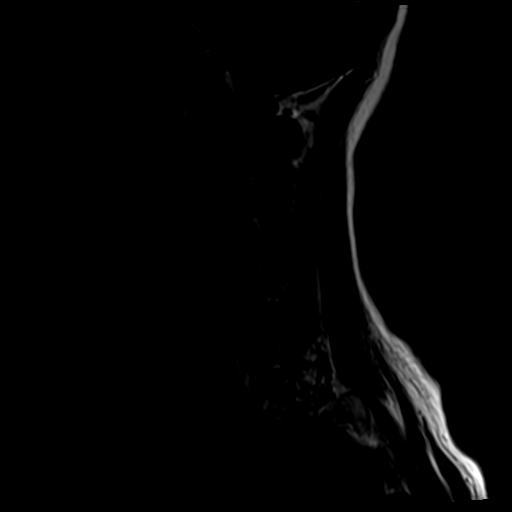

[Series 4: tir sag · sagittal · 3.0mm · 0.41mm/px · 5 of 15 slices shown]
[im 1/15]
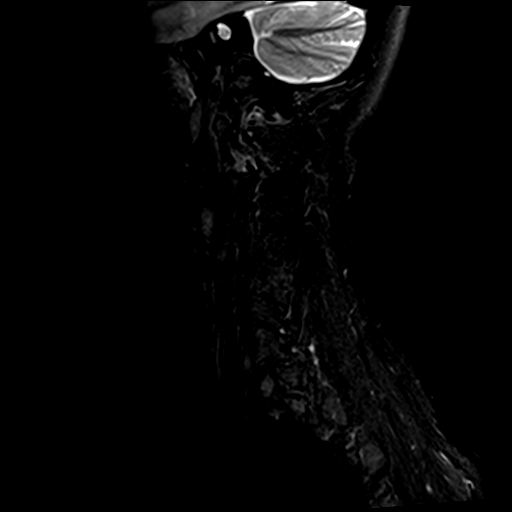
[im 3/15]
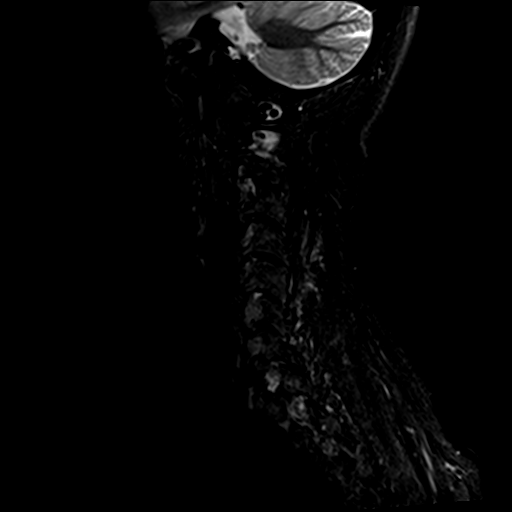
[im 5/15]
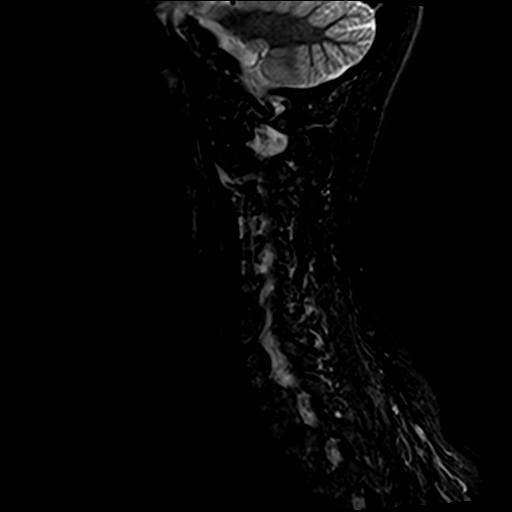
[im 9/15]
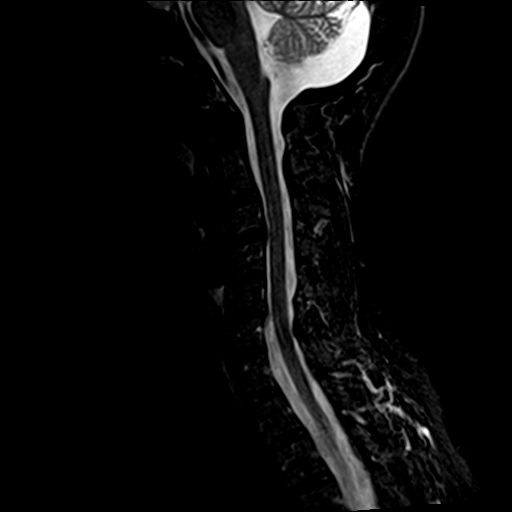
[im 13/15]
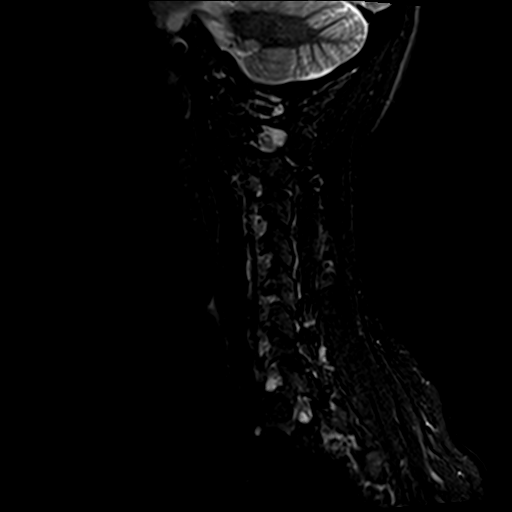

[Series 6: T2 · axial · 3.0mm · 0.70mm/px · z∈[-78,+8]mm · 9 of 25 slices shown (2 of 2)]
[im 1/25]
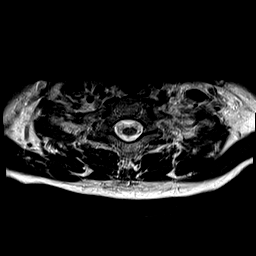
[im 5/25]
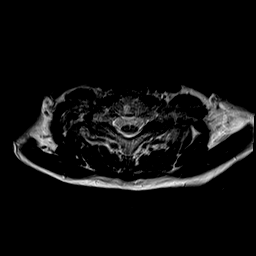
[im 9/25]
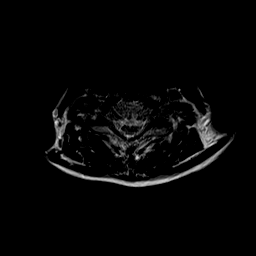
[im 11/25]
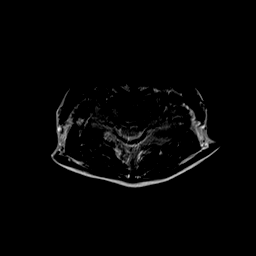
[im 13/25]
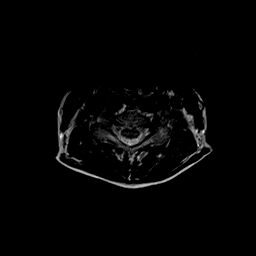
[im 15/25]
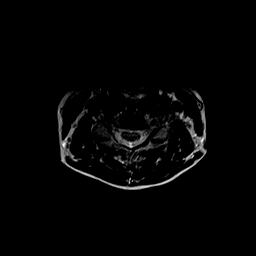
[im 17/25]
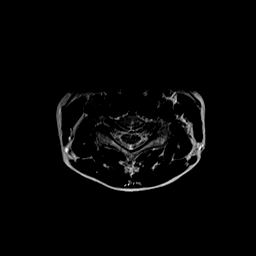
[im 21/25]
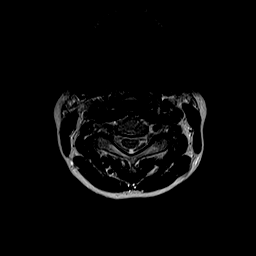
[im 25/25]
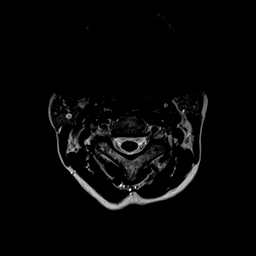

[28 of 48 positions shown; findings below may reference images not displayed]

FINDINGS: Intermittently motion degraded examination. Most notably, there is
mild-to-moderate motion degradation of the axial T2 TSE sequence and
moderate motion degradation of the sagittal T1 sequence.

Alignment: Reversal of the expected cervical lordosis. Trace C5-C6
grade 1 retrolisthesis.

Vertebrae: Vertebral body height is maintained. Nonspecific 5 mm
round T2/STIR hyperintense and T1 hypointense lesion within the C7
vertebral body. Elsewhere, no significant marrow edema or focal
suspicious osseous lesion is identified.

Cord: Within the limitations of motion degradation, no spinal cord
signal abnormality is identified.

Posterior Fossa, vertebral arteries, paraspinal tissues: No
abnormality identified within included portions of the posterior
fossa. Flow voids preserved within the imaged cervical vertebral
arteries. Paraspinal soft tissues unremarkable.

Disc levels:

Moderate disc degeneration at C5-C6. No more than mild disc
degeneration at the remaining levels.

C2-C3: Minimal facet arthrosis. No significant disc herniation or
stenosis.

C3-C4: Small central disc protrusion. Mild facet arthrosis. The disc
protrusion focally effaces the ventral thecal sac, resulting in
minimal relative spinal canal narrowing and contacting the ventral
spinal cord. No significant foraminal narrowing.

C4-C5: No significant disc herniation or stenosis.

C5-C6: Trace grade 1 retrolisthesis. Posterior disc osteophyte
complex with bilateral disc osteophyte ridge/uncinate hypertrophy.
Mild facet arthrosis. No significant spinal canal stenosis. Mild
bilateral neural foraminal narrowing.

C6-C7: Shallow disc bulge. No significant spinal canal or foraminal
stenosis.

C7-T1: No significant disc herniation or stenosis.

Impression #5 will be called to the ordering clinician or
representative by the Radiologist Assistant, and communication
documented in the PACS or [REDACTED].
IMPRESSION: Motion degraded exam.

Cervical spondylosis, as outlined and with findings most notably as
follows.

At C5-C6, there is trace grade 1 retrolisthesis. Moderate disc
degeneration. Posterior disc osteophyte complex with bilateral disc
osteophyte ridge/uncinate hypertrophy. Mild facet arthrosis. No
significant spinal canal stenosis. Mild bilateral neural foraminal
narrowing.

At C3-C4, there is a small central disc protrusion which focally
effaces the ventral thecal sac, resulting in minimal relative spinal
canal narrowing, and contacting the ventral spinal cord.

5 mm lesion within the C7 vertebral body, as described. While this
may reflect an atypical hemangioma, the imaging features are
nonspecific and this finding remains indeterminate. Consider a
six-month follow-up cervical spine MRI to ensure stability.

Reversal of the expected cervical lordosis.

## 2021-07-16 NOTE — Progress Notes (Signed)
Tried calling pt, With an interpter on the line. LMOVM for pt to call the office back.

## 2021-07-28 ENCOUNTER — Telehealth: Payer: Self-pay

## 2021-07-28 ENCOUNTER — Telehealth: Payer: Self-pay | Admitting: Neurology

## 2021-07-28 DIAGNOSIS — D1809 Hemangioma of other sites: Secondary | ICD-10-CM

## 2021-07-28 MED ORDER — NURTEC 75 MG PO TBDP: 75.0000 mg | ORAL_TABLET | ORAL | 5 refills | Status: DC | PRN

## 2021-07-28 NOTE — Telephone Encounter (Signed)
-----   Message from Pieter Partridge, DO sent at 07/08/2021  3:43 PM EDT ----- MRI of cervical spine reviewed - it shows some arthritis in the neck but no pinching of nerves to explain her arm pain, so I don't have a definitive answer to that.  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

## 2021-07-28 NOTE — Telephone Encounter (Signed)
Pt said she was given some samples last time she was in office, and she would like to get an RX for it. She would like to get her results for her MRI.

## 2021-07-29 NOTE — Telephone Encounter (Signed)
Pt advised of MRI Cervical Spine results.  Repeat MRI in 6 mos order added.

## 2021-08-02 ENCOUNTER — Telehealth: Payer: Self-pay

## 2021-08-02 NOTE — Telephone Encounter (Signed)
New message   Deniz Eskridge (Key: [id number]) Nurtec 75MG  dispersible tablets   Form [employer] ePA Form 2017 NCPDP Created 7 minutes ago Sent to Plan 4 minutes ago Plan Response 4 minutes ago Submit Clinical Questions less than a minute ago Determination Wait for Determination Please wait for Med[employer] 2017 to return a determination.

## 2021-08-02 NOTE — Telephone Encounter (Signed)
F/u   Lataysha Vohra (Key: [id number]) Nurtec 75MG  dispersible tablets   Form [employer] ePA Form 2017 NCPDP Created 7 minutes ago Sent to Plan 4 minutes ago Plan Response 4 minutes ago Submit Clinical Questions less than a minute ago Determination Wait for Determination Please wait for MedImp[employer] 2017 to return a determination.

## 2021-08-03 ENCOUNTER — Other Ambulatory Visit: Payer: Self-pay

## 2021-08-03 ENCOUNTER — Ambulatory Visit
Admission: RE | Admit: 2021-08-03 | Discharge: 2021-08-03 | Disposition: A | Discharge status: Home / Self Care | Payer: 59 | Source: Ambulatory Visit | Attending: Family Medicine | Admitting: Family Medicine

## 2021-08-03 ENCOUNTER — Other Ambulatory Visit: Payer: Self-pay | Admitting: Family Medicine

## 2021-08-03 DIAGNOSIS — M25562 Pain in left knee: Secondary | ICD-10-CM

## 2021-08-03 DIAGNOSIS — M25561 Pain in right knee: Secondary | ICD-10-CM

## 2021-08-03 IMAGING — CR DG KNEE COMPLETE 4+V*L*
4 series · 4 of 4 positions shown · non-contrast
Comparison: None.

CLINICAL DATA: Bilateral knee pain

EXAM:
LEFT KNEE - COMPLETE 4+ VIEW

[w knee ap left]
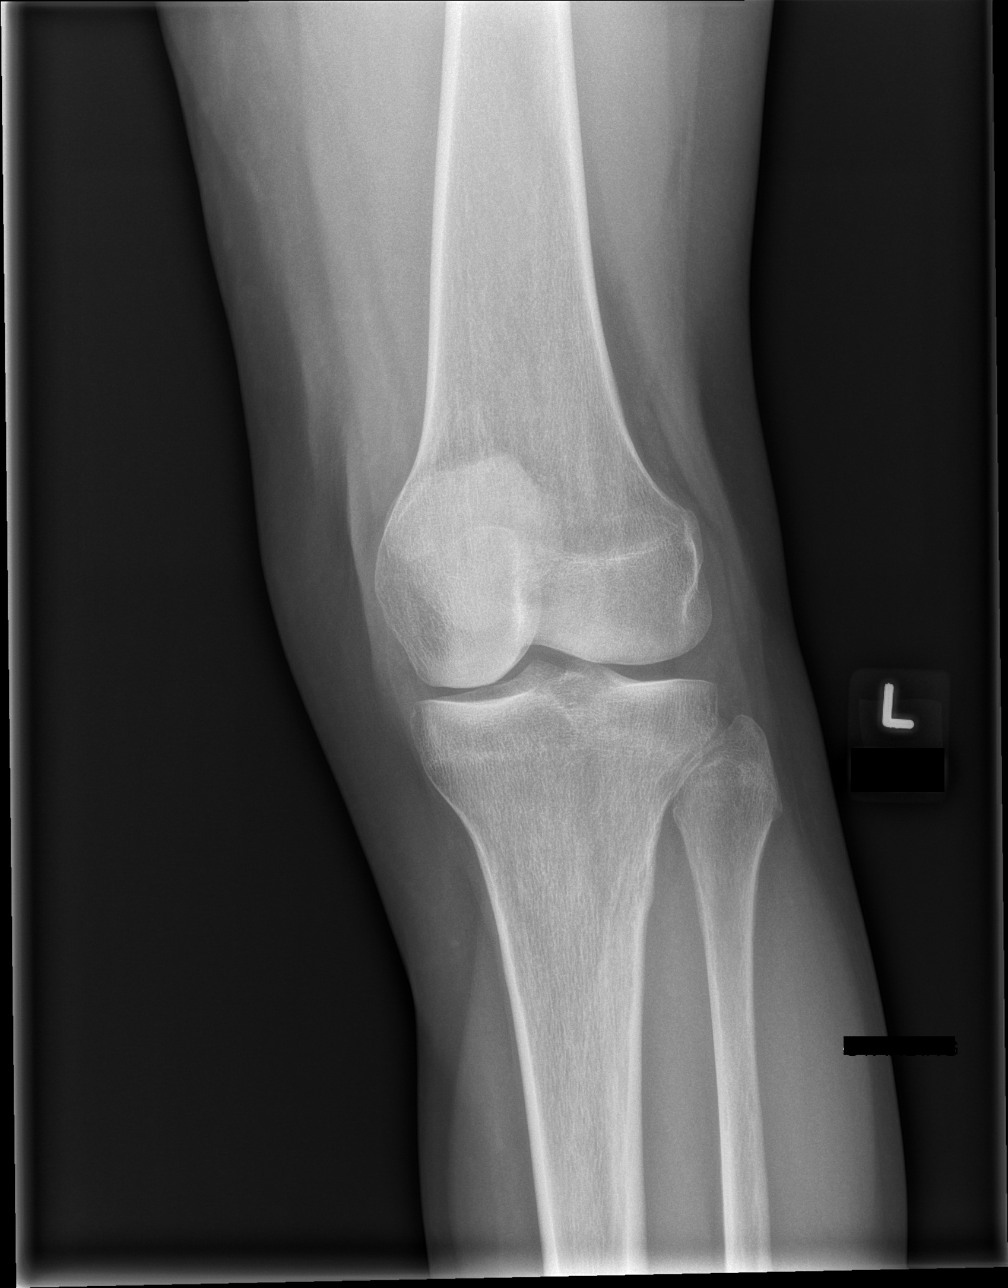

[w knee lat left]
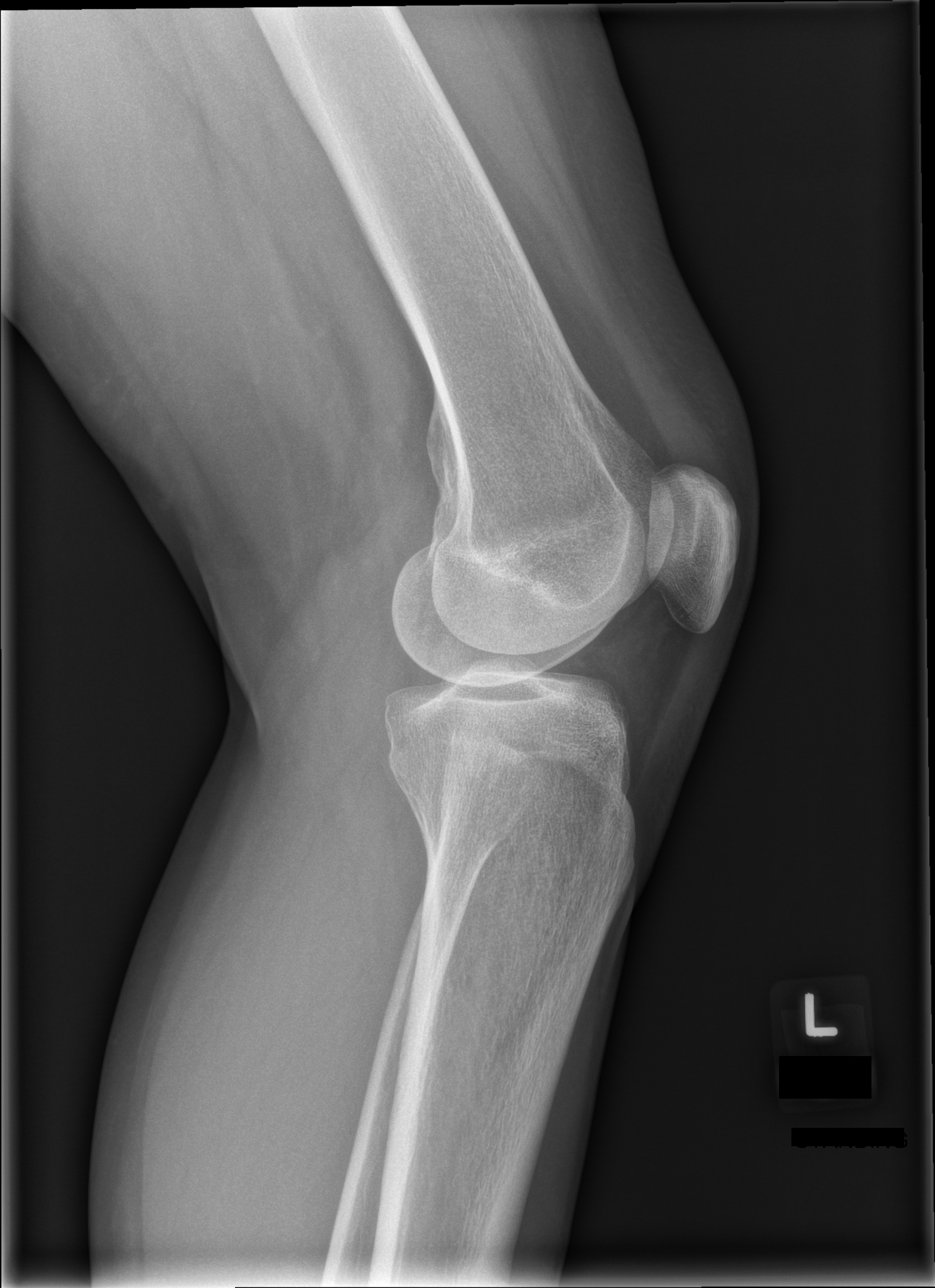

[w knee tunnel pa left]
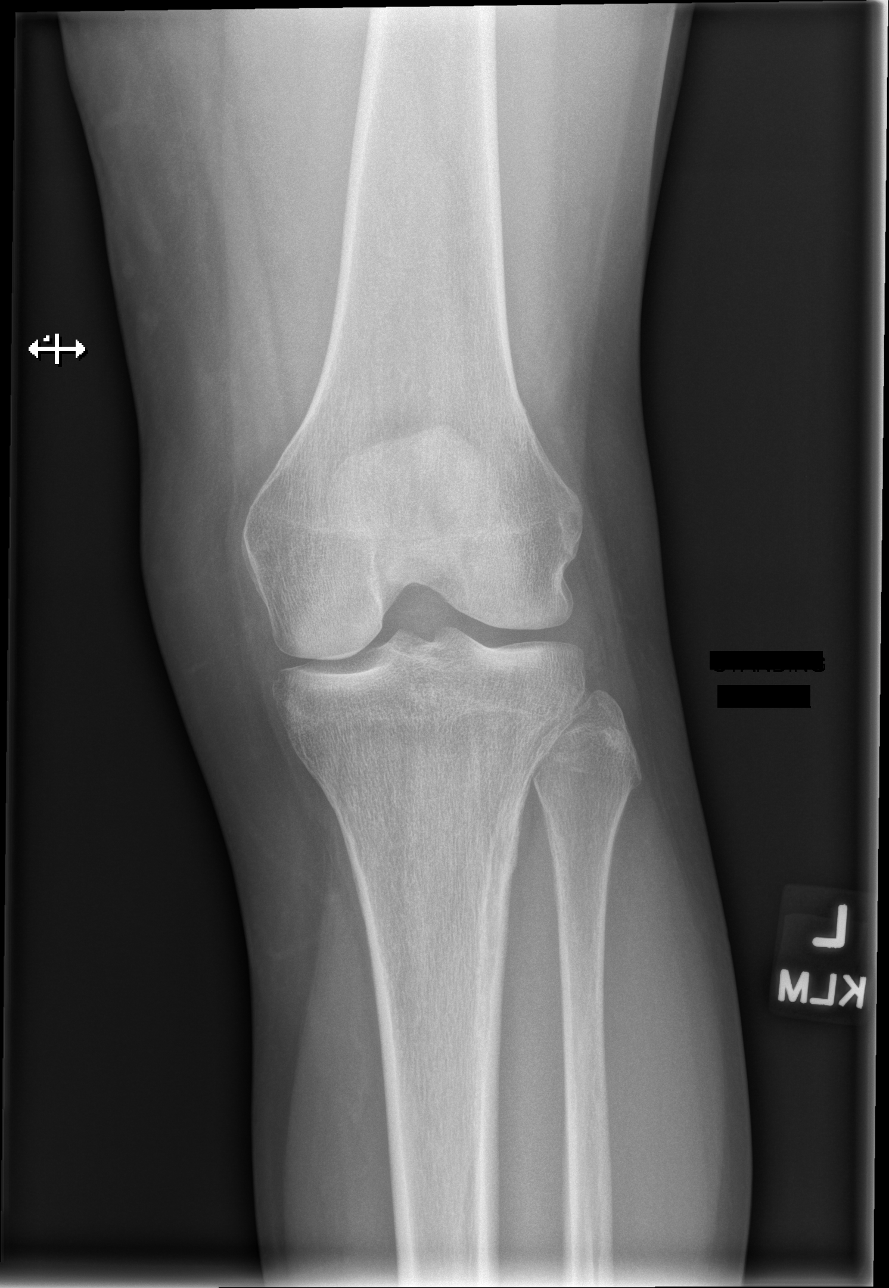

[x knee sunrise left]
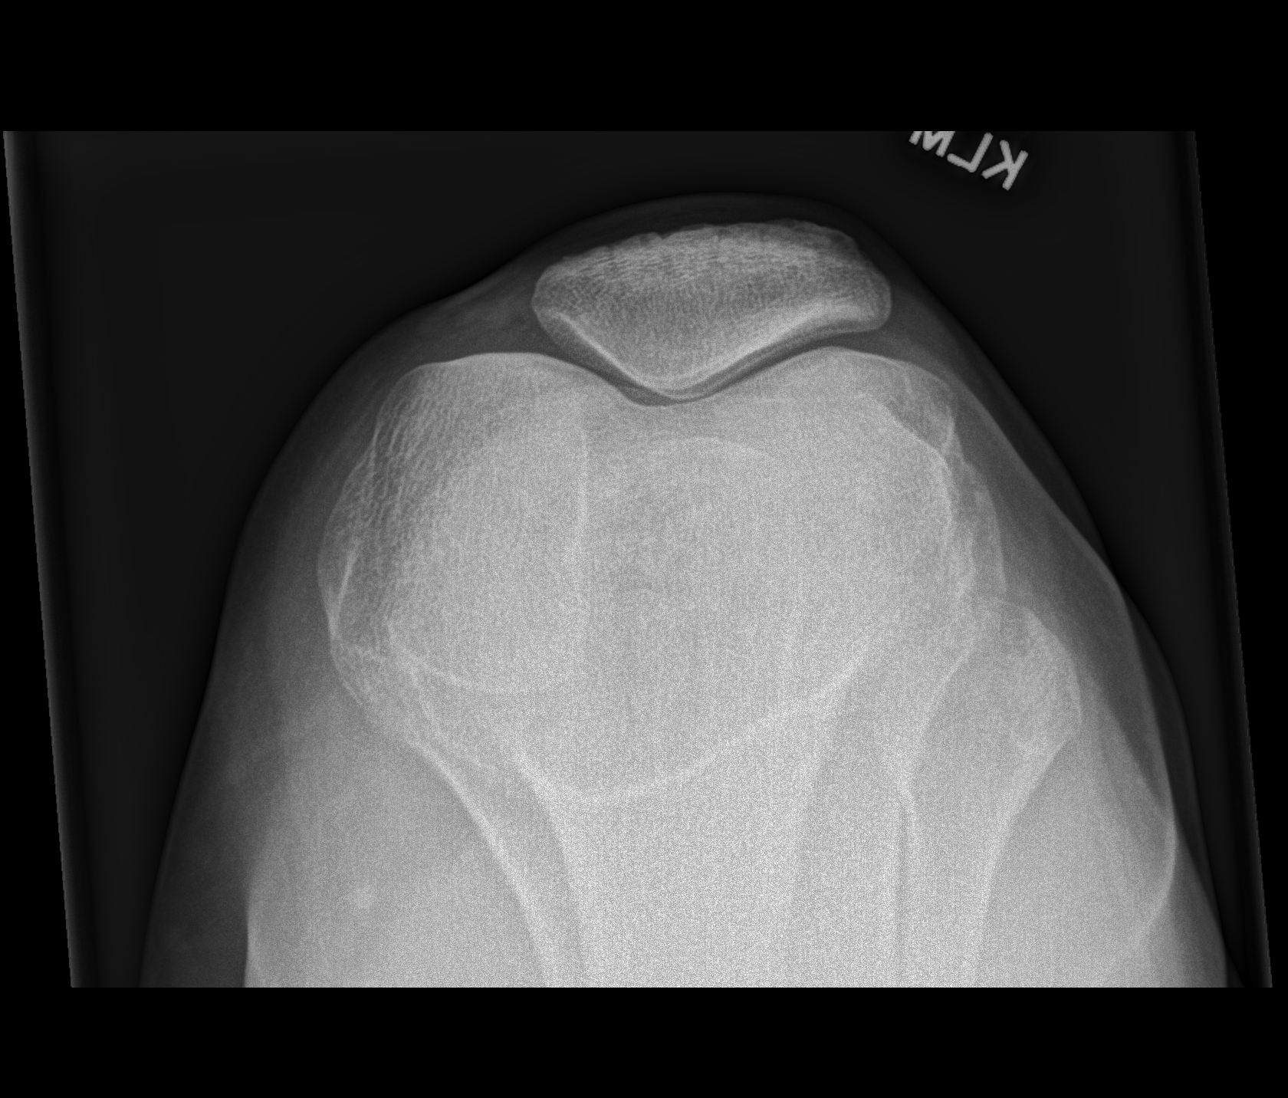

[4 of 4 positions shown; findings below may reference images not displayed]

FINDINGS: No evidence of fracture, dislocation, or joint effusion. No evidence
of arthropathy or other focal bone abnormality. Soft tissues are
unremarkable.
IMPRESSION: Negative.

## 2021-08-03 NOTE — Telephone Encounter (Signed)
F/u  Your prior authorization request has been denied. COMPLETE APPEAL Your request for prior authorization was denied, but an appeal is available for your patient. For assistance, contact our support team at [phone].  Message from plan: This request has not been approved. Based on the information submitted for review, you did not meet our guideline rules for the requested drug. In order for your request to be approved, your provider would need to show that you have met the guideline rules below. The details below are written in medical language. If you have questions, please contact your provider. In some cases, the requested medication or alternatives offered may have additional approval requirements. Our guideline named RIMEGEPANT (Nurtec ODT) requires that the requested medication is for ONE of the following:  1. Treatment of acute (quick onset) migraine.  2. Preventive treatment of episodic migraines. In addition, the following rule(s) must be met for approval: A. If the request is for the treatment of acute migraine, approval also requires:  1. You had a trial of or contraindication (harmful for) to ONE triptan (such as sumatriptan, rizatriptan). Your provider told us that this medication is being requested for the acute treatment of migraine. We do not have information showing that you met the conditions listed above. This is why your request is denied. Please follow up with your provider. A written notification letter will follow with additional details.

## 2021-08-05 ENCOUNTER — Telehealth: Payer: Self-pay | Admitting: *Deleted

## 2021-08-05 NOTE — Telephone Encounter (Signed)
Patient called c/o severe vaginal dryness, she was on HRT and stopped this due to history of stroke. Patient saw the Neurologist (notes in epic). Patient called asking for any recommendations to help with this, I told her about Replens,she has tried this and no relief.  Patient reports the dryness is constant, I did relay you may want to see her to discuss, but she did ask me to check with you for recommendations for her to start ASAP. Please advise

## 2021-08-05 NOTE — Telephone Encounter (Signed)
I recommend vaginal vitamin E suppositories, 200 U/ml.  Sig:  Place one per vaginal twice weekly at bedtime.  Disp:  8 RF until annual exam is due.   This will need to be sent to a compounding pharmacy.

## 2021-08-06 MED ORDER — NONFORMULARY OR COMPOUNDED ITEM: 2 refills | Status: DC

## 2021-08-06 NOTE — Telephone Encounter (Signed)
Patient informed, Rx called in. Patient annual is in Jan 2023 with Marny Lowenstein, NP

## 2021-08-20 ENCOUNTER — Other Ambulatory Visit: Payer: Self-pay | Admitting: Neurology

## 2021-08-31 NOTE — Telephone Encounter (Signed)
F/u  Appeal submit   [employer] is reviewing your PA request. You may close this dialog, return to your dashboard, and perform other tasks.  To check for an update later, open this request again from your dashboard. If [employer] has not replied within 24 hours for urgent requests or within 48 hours for standard requests, please contact [employer] at [phone].  Valerie Richards (Valerie Richards) Rx #: [id number] Nurtec 75MG  dispersible tablets   Form [employer] ePA Form 2017 NCPDP Created 11 days ago Sent to Plan 6 minutes ago Plan Response 6 minutes ago Submit Clinical Questions 1 minute ago Determination Wait for Determination Please wait for [employer] 2017 to return a determination.

## 2021-09-06 ENCOUNTER — Telehealth: Payer: Self-pay

## 2021-09-06 MED ORDER — RIZATRIPTAN BENZOATE 10 MG PO TBDP: 10.0000 mg | ORAL_TABLET | ORAL | 11 refills | Status: DC | PRN

## 2021-09-06 NOTE — Telephone Encounter (Signed)
Valerie Richards, send prescription for rizatriptan 10mg  - take earliest onset of migraine.  May repeat x1 after 2 hours.  PER Dr.Jaffe.   Pt advised.

## 2021-09-10 NOTE — Telephone Encounter (Addendum)
F/u   Appeal form   Valerie Richards (Key: [id number]) Nurtec 75MG  dispersible tablets   Form Letter of Consent Drug Appeal Form Appeal Created 24 hours ago Sent to Plan 1 minute ago Determination Wait for Determination Please wait for the payer to return a determination.  Fax sent to the plan Your PA has been faxed to the plan as a paper copy. Please contact the plan directly if you haven't received a determination in a typical timeframe.  You will be notified of the determination electronically and via fax.

## 2021-09-13 ENCOUNTER — Encounter: Payer: Self-pay | Admitting: Neurology

## 2021-09-16 ENCOUNTER — Ambulatory Visit (INDEPENDENT_AMBULATORY_CARE_PROVIDER_SITE_OTHER): Payer: 59 | Admitting: Nurse Practitioner

## 2021-09-16 ENCOUNTER — Other Ambulatory Visit: Payer: Self-pay

## 2021-09-16 ENCOUNTER — Encounter: Payer: Self-pay | Admitting: Nurse Practitioner

## 2021-09-16 VITALS — BP 92/60 | HR 78

## 2021-09-16 DIAGNOSIS — N898 Other specified noninflammatory disorders of vagina: Secondary | ICD-10-CM | POA: Diagnosis not present

## 2021-09-16 DIAGNOSIS — N952 Postmenopausal atrophic vaginitis: Secondary | ICD-10-CM

## 2021-09-16 LAB — WET PREP FOR TRICH, YEAST, CLUE

## 2021-09-16 NOTE — Telephone Encounter (Signed)
F/u   On 12.2.22 Appeal received noticed from [facility]   Medication Nurtec ODT 75 mg   [employer] will notify you by mail off the determination no later than seven days from receipt of your appeal.

## 2021-09-16 NOTE — Progress Notes (Signed)
   Acute Office Visit  Subjective:    Patient ID: Valerie Richards, female    DOB: 09-May-1971, 51 y.o.   MRN: [id number]   HPI 50 y.o. presents today for vaginal burning/itching/irritation with clear discharge, no odor. This is not new for her and is likely from atrophic vaginitis. She was on HRT in the past but was discontinued due to episode of dizziness and possible stroke. She was evaluated by neurology with unclear etiology but small infarct could not be ruled out. She has used triamcinolone, OTC lubricants, and Vitamin E suppositories twice weekly with minimal improvement. Vitamin E is also not covered by her insurance and is costly. It is affecting her life and beginning to cause depression.    Review of Systems  Constitutional: Negative.   Genitourinary:  Positive for vaginal pain (vulvar burning/irritation). Negative for vaginal discharge.      Objective:    Physical Exam Constitutional:      Appearance: Normal appearance.  Genitourinary:    General: Normal vulva.     Vagina: Normal.     Cervix: Normal.  Mild atrophic changes  BP 92/60   Pulse 78   LMP 02/26/2020 (Exact Date)   SpO2 99%  Wt Readings from Last 3 Encounters:  06/22/21 131 lb (59.4 kg)  12/17/20 135 lb 2 oz (61.3 kg)  12/15/20 135 lb (61.2 kg)   Wet prep negative     Assessment & Plan:   Problem List Items Addressed This Visit   None Visit Diagnoses     Atrophic vaginitis    -  Primary   Vaginal irritation       Relevant Orders   WET PREP FOR [place], YEAST, CLUE (Completed)      Plan: Wet prep unremarkable. She is requesting vaginal estrogen. We discussed her recent history and possible stroke (although appears to be unclear etiology) and possible risks with use. I will discuss with her neurologist since she has tried multiple treatment options with minimal relief. If neurology agreeable we will start vaginal estrogen twice weekly. Continue Vitamin E and coconut oil. She is agreeable to plan.       [place], 4:42 PM 09/16/2021

## 2021-09-20 ENCOUNTER — Other Ambulatory Visit: Payer: Self-pay | Admitting: Nurse Practitioner

## 2021-09-20 ENCOUNTER — Encounter: Payer: Self-pay | Admitting: Nurse Practitioner

## 2021-09-20 DIAGNOSIS — N952 Postmenopausal atrophic vaginitis: Secondary | ICD-10-CM

## 2021-09-20 MED ORDER — ESTRADIOL 0.1 MG/GM VA CREA
1.0000 | TOPICAL_CREAM | VAGINAL | 12 refills | Status: DC
Start: 2021-09-20 — End: 2022-06-02

## 2021-10-06 ENCOUNTER — Telehealth: Payer: Self-pay

## 2021-10-06 NOTE — Telephone Encounter (Signed)
I called patient and read her the unread My Chart message from Marny Lowenstein, NP.  "Hi Valerie Richards, I have sent in vaginal estrogen to the [facility] after discussing with your neurologist who is comfortable with you using. Initially you will use nightly x 2 weeks, then decrease to every other night x 2 weeks, then twice weekly for the duration of use. It is important to be consistent with it for best results. Reach out if you have any questions."  She voiced understanding about the directions.

## 2021-10-12 ENCOUNTER — Ambulatory Visit: Payer: 59 | Admitting: Nurse Practitioner

## 2021-10-12 DIAGNOSIS — Z0289 Encounter for other administrative examinations: Secondary | ICD-10-CM

## 2021-10-12 NOTE — Progress Notes (Deleted)
° °  Valerie Richards October 12, 1970 [id number]   History:  51 y.o. [id number] presents for annual exam. Postmenopausal. Normal pap history. Was started on Vaginal estrogen due to severe vaginal dryness/discomfort. Neurologist approved use - she had episode of dizziness of unknown cause but there was question whether it was a stroke 12/2020. She has tried multiple OTC options and Vitamin E suppositories but these are costly. She was on HRT when episode occurred and was discontinued.   Gynecologic History Patient's last menstrual period was 02/26/2020 (exact date).   Contraception/Family planning: post menopausal status Sexually active: ***  Health Maintenance Last Pap: 11/17/2017. Results were: Normal, 5-year repeat Last mammogram: 06/07/2021. Results were: Probably benign masses x 2 in right breasts, 30-month follow up  Last colonoscopy: Never Last Dexa: Not indicated   Past medical history, past surgical history, family history and social history were all reviewed and documented in the EPIC chart.  ROS:  A ROS was performed and pertinent positives and negatives are included.  Exam:  There were no vitals filed for this visit. There is no height or weight on file to calculate BMI.  General appearance:  Normal Thyroid:  Symmetrical, normal in size, without palpable masses or nodularity. Respiratory  Auscultation:  Clear without wheezing or rhonchi Cardiovascular  Auscultation:  Regular rate, without rubs, murmurs or gallops  Edema/varicosities:  Not grossly evident Abdominal  Soft,nontender, without masses, guarding or rebound.  Liver/spleen:  No organomegaly noted  Hernia:  None appreciated  Skin  Inspection:  Grossly normal Breasts: Examined lying and sitting.   Right: Without masses, retractions, nipple discharge or axillary adenopathy.   Left: Without masses, retractions, nipple discharge or axillary adenopathy. Genitourinary   Inguinal/mons:  Normal without inguinal  adenopathy  External genitalia:  Normal appearing vulva with no masses, tenderness, or lesions  BUS/Urethra/Skene's glands:  Normal  Vagina:  Normal appearing with normal color and discharge, no lesions  Cervix:  Normal appearing without discharge or lesions  Uterus:  Normal in size, shape and contour.  Midline and mobile, nontender  Adnexa/parametria:     Rt: Normal in size, without masses or tenderness.   Lt: Normal in size, without masses or tenderness.  Anus and perineum: Normal  Digital rectal exam: Normal sphincter tone without palpated masses or tenderness  Patient informed chaperone available to be present for breast and pelvic exam. Patient has requested no chaperone to be present. Patient has been advised what will be completed during breast and pelvic exam.   Assessment/Plan:  51 y.o. [id number] for annual exam.    Return in 1 year for annual.   Tamela Gammon DNP, 9:56 AM 10/12/2021

## 2021-11-04 ENCOUNTER — Ambulatory Visit: Payer: 59 | Admitting: Physician Assistant

## 2021-11-04 ENCOUNTER — Encounter: Payer: Self-pay | Admitting: Physician Assistant

## 2021-11-04 VITALS — BP 102/60 | HR 59 | Ht 64.0 in | Wt 122.4 lb

## 2021-11-04 DIAGNOSIS — K219 Gastro-esophageal reflux disease without esophagitis: Secondary | ICD-10-CM

## 2021-11-04 DIAGNOSIS — R1013 Epigastric pain: Secondary | ICD-10-CM

## 2021-11-04 DIAGNOSIS — R1314 Dysphagia, pharyngoesophageal phase: Secondary | ICD-10-CM | POA: Diagnosis not present

## 2021-11-04 DIAGNOSIS — K5909 Other constipation: Secondary | ICD-10-CM | POA: Diagnosis not present

## 2021-11-04 MED ORDER — PANTOPRAZOLE SODIUM 40 MG PO TBEC: 40.0000 mg | DELAYED_RELEASE_TABLET | Freq: Two times a day (BID) | ORAL | 5 refills | Status: DC

## 2021-11-04 NOTE — Progress Notes (Addendum)
Chief Complaint: GERD and dysphagia  HPI:     Valerie Richards is a 51 year old aerobic female with a past medical history as listed below, known to Dr. Tarri Glenn, who presents to clinic today for discussion of a screening colonoscopy and reflux.    12/17/2020 patient saw Dr. Tarri Glenn in clinic and described she felt like there was a "odor" coming from her stomach as well as heartburn and long-term constipation.  Discussed the father at the age of 65 with colon cancer.  That time discussed normal labs and recommended a trial of Metamucil and trial of a PPI.  She was scheduled for an EGD and a colonoscopy with a 2-day bowel prep.    01/20/2021 it looks like patient had a colonoscopy in [place].  We cannot see report.    Today, the patient tells me that she eventually had a colonoscopy at a different facility due to timing and scheduling preferences.  She comes back today because she tells me that anything she eats seems to give her trouble.  She has a hard time swallowing and sometimes feels like her throat just closes up worse in the morning and in the evenings or if she wakes up in the middle the night.  Also describes a lot of burning in the top of her stomach and pain which is sharp in nature rated as a 6-7/10 at times.  She has been on Omeprazole 40 mg once daily and tells me this helps with some of the symptoms but she continues to feel mostly miserable.    Patient recently started on Linzess 290 mcg for her constipation by her PCP and tells me this helps her a little bit.    Denies fever, chills, weight loss, blood in her stool or symptoms that awaken her from sleep.  Past Medical History:  Diagnosis Date   Anemia    iron deficiency   Fibroid    Stroke ([place]) 12/2020   See MRI.    Past Surgical History:  Procedure Laterality Date   EYE SURGERY Bilateral    TONSILLECTOMY      Current Outpatient Medications  Medication Sig Dispense Refill   buPROPion (WELLBUTRIN XL) 150 MG 24 hr tablet  Take 150 mg by mouth at bedtime.     CALTRATE 600+D3 SOFT 600-20 MG-MCG CHEW Chew 1 tablet by mouth daily as needed.     cyclobenzaprine (FLEXERIL) 10 MG tablet TAKE 1 TABLET BY MOUTH EVERY 8 HOURS AS NEEDED FOR SPASM     diclofenac Sodium (VOLTAREN) 1 % GEL Voltaren 1 % topical gel  APPLY 2 GRAMS TO THE AFFECTED AREA(S) BY TOPICAL ROUTE 4 TIMES PER DAY     estradiol (ESTRACE VAGINAL) 0.1 MG/GM vaginal cream Place 1 Applicatorful vaginally 2 (two) times a week. Initial dose: nightly x 2 weeks, then decrease to every other night x 2 weeks, then twice weekly 42.5 g 12   LINZESS 290 MCG CAPS capsule Take 290 mcg by mouth every morning.     meloxicam (MOBIC) 15 MG tablet Take 1 tablet by mouth daily.     NONFORMULARY OR COMPOUNDED ITEM Vaginal Vitamin E suppositories 200 U/ml insert one per vaginally twice weekly at bedtime. 8 each 2   omeprazole (PRILOSEC) 40 MG capsule Take 1 capsule (40 mg total) by mouth every morning. 90 capsule 3   ondansetron (ZOFRAN ODT) 8 MG disintegrating tablet Take 1 tablet (8 mg total) by mouth every 8 (eight) hours as needed for nausea or vomiting. 20 tablet  5   ondansetron (ZOFRAN) 8 MG tablet ondansetron HCl 8 mg tablet     Psyllium 0.36 g CAPS Take 1 capsule daily x7 days, then increase to BID     Rimegepant Sulfate (NURTEC) 75 MG TBDP Take 75 mg by mouth as needed (Take one tablet as needed at the earlist onset of a Migraine.  Max 1 tablet in 24 hours). 16 tablet 5   rizatriptan (MAXALT-MLT) 10 MG disintegrating tablet Take 1 tablet (10 mg total) by mouth as needed for migraine. May repeat in 2 hours if needed 9 tablet 11   sertraline (ZOLOFT) 100 MG tablet Take 1 tablet by mouth every morning.     topiramate (TOPAMAX) 100 MG tablet Take 1 tablet (100 mg total) by mouth at bedtime. 30 tablet 5   traZODone (DESYREL) 100 MG tablet      triamcinolone ointment (KENALOG) 0.1 % APPLY SMALL AMOUNT OF OINTMENT TOPICALLY TWICE DAILY FOR 14 DAYS THEN TWICE WEEKLY 30 g 1    zolpidem (AMBIEN) 5 MG tablet Take 5 mg by mouth at bedtime as needed.     No current facility-administered medications for this visit.    Allergies as of 11/04/2021   (No Known Allergies)    Family History  Problem Relation Age of Onset   Hypertension Mother    Cancer Father    Diabetes Father    Colon cancer Father    Kidney failure Father    Diabetes Brother     Social History   Socioeconomic History   Marital status: Widowed    Spouse name: Not on file   Number of children: Not on file   Years of education: Not on file   Highest education level: Not on file  Occupational History   Occupation: Scientist, water quality    Comment: [employer]  Tobacco Use   Smoking status: Former    Packs/day: 0.25    Years: 8.00    Pack years: 2.00    Types: Cigarettes    Quit date: 10/10/2012    Years since quitting: 9.0   Smokeless tobacco: Never  Vaping Use   Vaping Use: Never used  Substance and Sexual Activity   Alcohol use: No    Alcohol/week: 0.0 standard drinks   Drug use: No   Sexual activity: Not Currently    Birth control/protection: Post-menopausal  Other Topics Concern   Not on file  Social History Narrative   Widowed November 28, 2015. Husband died from [place] disease.    Right handed drinks caffeine   2 story home   Social Determinants of Health   Financial Resource Strain: Not on file  Food Insecurity: Not on file  Transportation Needs: Not on file  Physical Activity: Not on file  Stress: Not on file  Social Connections: Not on file  Intimate Partner Violence: Not on file    Review of Systems:    Constitutional: No weight loss, fever or chills Cardiovascular: No chest pain Respiratory: No SOB  Gastrointestinal: See HPI and otherwise negative   Physical Exam:  Vital signs: BP 102/60    Pulse (!) 59    Ht 5\' 4"  (1.626 m)    Wt 122 lb 6.4 oz (55.5 kg)    LMP 02/26/2020 (Exact Date)    SpO2 99%    BMI 21.01 kg/m    Constitutional:   Pleasant Arabic  female appears to be in NAD, Well developed, Well nourished, alert and cooperative Respiratory: Respirations even and unlabored. Lungs clear to  auscultation bilaterally.   No wheezes, crackles, or rhonchi.  Cardiovascular: Normal S1, S2. No MRG. Regular rate and rhythm. No peripheral edema, cyanosis or pallor.  Gastrointestinal:  Soft, nondistended, moderate generalized TTP, some worse in the epigastrium no rebound or guarding. Normal bowel sounds. No appreciable masses or hepatomegaly. Rectal:  Not performed.  Psychiatric: Demonstrates good judgement and reason without abnormal affect or behaviors.  No recent labs.  Assessment: 1.  Dysphagia: Likely due to esophageal stricture given uncontrolled heartburn/reflux 2.  Heartburn/epigastric pain: With above 3.  Family history of colon cancer: In her father at the age of 66, she had a recent colonoscopy in 2022, we are requesting records  Plan: 1.  We will request patient's recent colonoscopy.  She tells me was normal.  Will likely need recall in 5 years. 2.  Scheduled patient for diagnostic EGD with dilation due to dysphagia, heartburn and epigastric pain with Dr. Tarri Glenn in the [facility].  Did provide the patient a detailed list of risks for the procedure and she agrees to proceed. Patient is appropriate for endoscopic procedure(s) in the ambulatory ([facility]) setting.  3.  Stop Omeprazole.  Prescribed Pantoprazole 40 mg twice daily, 30-60 minutes before breakfast and dinner.  #60 with 5 refills. 4.  Reviewed antireflux and antidysphagia measures today. 5.  Patient to follow in clinic per recommendations from Dr. Tarri Glenn after time of procedure.  Ellouise Newer, PA-C [facility] 11/04/2021, 9:15 AM  Cc: Fanny Bien, MD   Addendum: 12/03/2021 1:34 PM  Records received from [facility]  01/20/2021 colonoscopy done for history of colon cancer in the patient's father before the age of 41 and incidental constipation  with findings of one 2 mm polyp in the ascending colon and melanosis.  Pathology showed colonic mucosa with mild superficial hyperplastic changes and a small lymphoid aggregate.  Repeat recommended in 5 years given family history.  No changes to recommendations.  Patient will need repeat colonoscopy in 2027.  Ellouise Newer, PA-C

## 2021-11-04 NOTE — Progress Notes (Signed)
Records available in [facility] reviewed. Always difficult when there are multiple gastroenterologists involved. Makes it very difficult to coordinate care appropriately. The records from the Gastroenterologist who performed the colonoscopy looks like he recommended pelvic floor therapy. Did she complete that as recommended? Thanks.

## 2021-11-04 NOTE — Patient Instructions (Signed)
We have sent the following medications to your pharmacy for you to pick up at your convenience: Pantoprazole 40 mg twice daily 30-60 minutes before breakfast and dinner.   Stop Omeprazole.   You have been scheduled for an endoscopy. Please follow written instructions given to you at your visit today. If you use inhalers (even only as needed), please bring them with you on the day of your procedure.  If you are age 52 or older, your body mass index should be between 23-30. Your Body mass index is 21.01 kg/m. If this is out of the aforementioned range listed, please consider follow up with your Primary Care Provider.  If you are age 13 or younger, your body mass index should be between 19-25. Your Body mass index is 21.01 kg/m. If this is out of the aformentioned range listed, please consider follow up with your Primary Care Provider.   ________________________________________________________  The [facility] providers would like to encourage you to use [facility] to communicate with providers for non-urgent requests or questions.  Due to long hold times on the telephone, sending your provider a message by [facility] may be a faster and more efficient way to get a response.  Please allow 48 business hours for a response.  Please remember that this is for non-urgent requests.  _______________________________________________________

## 2021-11-09 ENCOUNTER — Telehealth: Payer: Self-pay | Admitting: *Deleted

## 2021-11-09 NOTE — Telephone Encounter (Signed)
-----   Message from Levin Erp, [place] sent at 11/04/2021 11:19 AM EST ----- Regarding: pelvic floor therapy Dr. Tarri Glenn would like to know if patient completed pelvic floor therapy as recommended by her last [place] doctor.  Thanks ----- Message ----- From: Thornton Park, MD Sent: 11/04/2021  10:06 AM EST To: Levin Erp, PA     ----- Message ----- From: Levin Erp, [place] Sent: 11/04/2021   9:55 AM EST To: Thornton Park, MD

## 2021-11-09 NOTE — Telephone Encounter (Signed)
Noted  

## 2021-11-09 NOTE — Telephone Encounter (Signed)
Patient's states she did go to one visit for pelvic floor  PT but did not like it and her insurance would not cover it so she decided not to continue.

## 2021-11-09 NOTE — Telephone Encounter (Signed)
PA submitted for Pantoprazole 40 mg via Covermymeds.

## 2021-11-09 NOTE — Telephone Encounter (Signed)
Spoke with patient and insurance is not covering Pantoprazole. Informed patient would complete a prior authorization and let her know once medication is approved.

## 2021-11-11 NOTE — Telephone Encounter (Signed)
PA approved for Pantoprazole. Faxed approval to patient's pharmacy.

## 2021-11-12 ENCOUNTER — Telehealth: Payer: Self-pay

## 2021-11-12 NOTE — Telephone Encounter (Deleted)
error 

## 2021-11-16 ENCOUNTER — Encounter: Payer: Self-pay | Admitting: Gastroenterology

## 2021-11-16 ENCOUNTER — Ambulatory Visit (AMBULATORY_SURGERY_CENTER): Payer: 59 | Admitting: Gastroenterology

## 2021-11-16 VITALS — BP 121/70 | HR 58 | Temp 97.3°F | Resp 15 | Ht 64.0 in | Wt 122.0 lb

## 2021-11-16 DIAGNOSIS — K208 Other esophagitis without bleeding: Secondary | ICD-10-CM

## 2021-11-16 DIAGNOSIS — K2970 Gastritis, unspecified, without bleeding: Secondary | ICD-10-CM

## 2021-11-16 DIAGNOSIS — R1013 Epigastric pain: Secondary | ICD-10-CM | POA: Diagnosis not present

## 2021-11-16 DIAGNOSIS — K219 Gastro-esophageal reflux disease without esophagitis: Secondary | ICD-10-CM

## 2021-11-16 DIAGNOSIS — R1314 Dysphagia, pharyngoesophageal phase: Secondary | ICD-10-CM | POA: Diagnosis not present

## 2021-11-16 MED ORDER — SODIUM CHLORIDE 0.9 % IV SOLN
500.0000 mL | Freq: Once | INTRAVENOUS | Status: DC
Start: 2021-11-16 — End: 2021-11-16

## 2021-11-16 NOTE — Op Note (Signed)
[facility] Patient Name: Valerie Richards Procedure Date: 11/16/2021 9:29 AM MRN: [id number] Endoscopist: Thornton Park MD, MD Age: 51 Referring MD:  Date of Birth: September 11, 1971 Gender: Female Account #: [id number] Procedure:                Upper GI endoscopy Indications:              Upper abdominal pain, Dysphagia Medicines:                Monitored Anesthesia Care Procedure:                Pre-Anesthesia Assessment:                           - Prior to the procedure, a History and Physical                            was performed, and patient medications and                            allergies were reviewed. The patient's tolerance of                            previous anesthesia was also reviewed. The risks                            and benefits of the procedure and the sedation                            options and risks were discussed with the patient.                            All questions were answered, and informed consent                            was obtained. Prior Anticoagulants: The patient has                            taken no previous anticoagulant or antiplatelet                            agents. ASA Grade Assessment: II - A patient with                            mild systemic disease. After reviewing the risks                            and benefits, the patient was deemed in                            satisfactory condition to undergo the procedure.                           After obtaining informed consent, the endoscope was  passed under direct vision. Throughout the                            procedure, the patient's blood pressure, pulse, and                            oxygen saturations were monitored continuously. The                            GIF [id number] #[id number] was introduced through the                            mouth, and advanced to the third part of duodenum.                            The upper GI endoscopy  was accomplished without                            difficulty. The patient tolerated the procedure                            well. Scope In: Scope Out: Findings:                 No endoscopic abnormality was evident in the                            esophagus to explain the patient's complaint of                            dysphagia. It was decided, however, to proceed with                            dilation in the distal esophagus. A TTS dilator was                            passed through the scope. Dilation with a 16-17-18                            mm balloon dilator was performed to 18 mm. The                            dilation site was examined and showed no change.                            Biopsies were obtained from the proximal and distal                            esophagus with cold forceps for histology of                            suspected eosinophilic esophagitis.  Patchy mildly erythematous mucosa without bleeding                            was found in the gastric body. Biopsies were taken                            from the antrum, body, and fundus with a cold                            forceps for histology. Estimated blood loss was                            minimal.                           The examined duodenum was normal.                           The exam was otherwise without abnormality. Complications:            No immediate complications. Estimated blood loss:                            Minimal. Estimated Blood Loss:     Estimated blood loss was minimal. Impression:               - No endoscopic esophageal abnormality to explain                            patient's dysphagia. Esophagus dilated. Dilated.                            Biopsied.                           - Erythematous mucosa in the gastric body. Biopsied.                           - Normal examined duodenum.                           - The examination was  otherwise normal. Recommendation:           - Patient has a contact number available for                            emergencies. The signs and symptoms of potential                            delayed complications were discussed with the                            patient. Return to normal activities tomorrow.                            Written discharge instructions were provided to the  patient.                           - Resume previous diet.                           - Continue present medications.                           - Await pathology results.                           - No aspirin, ibuprofen, naproxen, or other                            non-steroidal anti-inflammatory drugs. Thornton Park MD, MD 11/16/2021 9:53:25 AM This report has been signed electronically.

## 2021-11-16 NOTE — Progress Notes (Signed)
Called to room to assist during endoscopic procedure.  Patient ID and intended procedure confirmed with present staff. Received instructions for my participation in the procedure from the performing physician.  

## 2021-11-16 NOTE — Progress Notes (Signed)
Pt non-responsive, VVS, Report to RN  °

## 2021-11-16 NOTE — Progress Notes (Signed)
Indications for procedure: Dysphagia, heartburn, epigastric pain  See the office note from 11/04/21 for full details. There has been no change in history or physical exam. She remains an appropriate candidate for monitored anesthesia care in the [facility].

## 2021-11-16 NOTE — Patient Instructions (Signed)
Resume previous diet.  Continue current medications.  NO aspirin, ibuprofen, naproxen, or other non steroidal anti-inflammatory drugs.  Await pathology results.  YOU HAD AN ENDOSCOPIC PROCEDURE TODAY AT [facility]:   Refer to the procedure report that was given to you for any specific questions about what was found during the examination.  If the procedure report does not answer your questions, please call your gastroenterologist to clarify.  If you requested that your care partner not be given the details of your procedure findings, then the procedure report has been included in a sealed envelope for you to review at your convenience later.  YOU SHOULD EXPECT: Some feelings of bloating in the abdomen. Passage of more gas than usual.  Walking can help get rid of the air that was put into your GI tract during the procedure and reduce the bloating. If you had a lower endoscopy (such as a colonoscopy or flexible sigmoidoscopy) you may notice spotting of blood in your stool or on the toilet paper. If you underwent a bowel prep for your procedure, you may not have a normal bowel movement for a few days.  Please Note:  You might notice some irritation and congestion in your nose or some drainage.  This is from the oxygen used during your procedure.  There is no need for concern and it should clear up in a day or so.  SYMPTOMS TO REPORT IMMEDIATELY:   Following upper endoscopy (EGD)  Vomiting of blood or coffee ground material  New chest pain or pain under the shoulder blades  Painful or persistently difficult swallowing  New shortness of breath  Fever of 100F or higher  Black, tarry-looking stools  For urgent or emergent issues, a gastroenterologist can be reached at any hour by calling [phone]. Do not use MyChart messaging for urgent concerns.    DIET:  We do recommend a small meal at first, but then you may proceed to your regular diet.  Drink plenty of fluids but you  should avoid alcoholic beverages for 24 hours.  ACTIVITY:  You should plan to take it easy for the rest of today and you should NOT DRIVE or use heavy machinery until tomorrow (because of the sedation medicines used during the test).    FOLLOW UP: Our staff will call the number listed on your records 48-72 hours following your procedure to check on you and address any questions or concerns that you may have regarding the information given to you following your procedure. If we do not reach you, we will leave a message.  We will attempt to reach you two times.  During this call, we will ask if you have developed any symptoms of COVID 19. If you develop any symptoms (ie: fever, flu-like symptoms, shortness of breath, cough etc.) before then, please call [phone].  If you test positive for Covid 19 in the 2 weeks post procedure, please call and report this information to [place].    If any biopsies were taken you will be contacted by phone or by letter within the next 1-3 weeks.  Please call us at [phone] if you have not heard about the biopsies in 3 weeks.    SIGNATURES/CONFIDENTIALITY: You and/or your care partner have signed paperwork which will be entered into your electronic medical record.  These signatures attest to the fact that that the information above on your After Visit Summary has been reviewed and is understood.  Full responsibility of the confidentiality of  this discharge information lies with you and/or your care-partner.

## 2021-11-18 ENCOUNTER — Telehealth: Payer: Self-pay

## 2021-11-18 DIAGNOSIS — R1013 Epigastric pain: Secondary | ICD-10-CM

## 2021-11-18 MED ORDER — SUCRALFATE 1 G PO TABS
ORAL_TABLET | ORAL | 2 refills | Status: DC
Start: 2021-11-18 — End: 2022-06-02

## 2021-11-18 NOTE — Telephone Encounter (Signed)
Following recommendations received by Dr. Tarri Glenn:  Continue pantoprazole 40 mg BID.  Add Carafate 1 gram slurry QID PRN. Biopsy results are still pending.  Office follow-up with Anderson Malta when she returns.   Thanks.   [place] with pt and informed about above recommendations. Verbalized acceptance and understanding. Pt also scheduled for f/u with Ellouise Newer, PA 12/20/21 @ 2pm.   While on the phone with pt, states Pantoprazole is not covered by her insurance and requested an alternative. Advised it is likely the Rx requires a PA d/t the increased amount recommended that is typically not covered by some insurance companies. Pt insisted PA be completed ASAP since she is leaving the country. Advised I will discuss further with pharmacy to determine if PA required. Otherwise will request orders for covered alternative from Dr. Tarri Glenn. Verbalized acceptance and understanding.   Rx's sent to pt preferred pharmacy per Dr. Tarri Glenn request.  Called pharmacy and spoke with pharmacist re: issue with processing Pantoprazole. States Rx has been processed, is ready for pick up with ZERO co-pay required. No need for PA or alternative. In addition, processed refill for Carafate. States this Rx is ready as well with co-pay of $7.04, no PA required. Given pt statement that she is leaving the country and requested Rx refills/PA be expedited, states she will call pt to advise BOTH Rx's are ready for pick up. No further action required.

## 2021-11-18 NOTE — Telephone Encounter (Signed)
°  Follow up Call-  Call back number 11/16/2021  Post procedure Call Back phone  # [phone]  Permission to leave phone message Yes  Some recent data might be hidden     Patient questions:  Do you have a fever, pain , or abdominal swelling? Yes.   Pain Score  5 *  Have you tolerated food without any problems? No.  Have you been able to return to your normal activities? Yes.    Do you have any questions about your discharge instructions: Diet   No. Medications  No. Follow up visit  No.  Do you have questions or concerns about your Care? No.  Actions: * If pain score is 4 or above: Physician/ provider Notified : Joelene Millin L. Beavers, MD.    Patient still having trouble swallowing, states that she has pain in her throat and burning in her stomach when eating. She states that her voice has changed. I let her know that we were waiting on biopsy results to come back. She is leaving on Monday 2/13 and will be out of the country for 3 weeks and wants to know if you have any recommendations for her before she leaves.

## 2021-11-19 ENCOUNTER — Other Ambulatory Visit: Payer: Self-pay

## 2021-11-19 DIAGNOSIS — K219 Gastro-esophageal reflux disease without esophagitis: Secondary | ICD-10-CM

## 2021-11-19 MED ORDER — FAMOTIDINE 20 MG PO TABS
20.0000 mg | ORAL_TABLET | Freq: Two times a day (BID) | ORAL | Status: DC
Start: 2021-11-19 — End: 2022-06-14

## 2021-11-22 NOTE — Telephone Encounter (Signed)
New message   Send Request to Plan Warning Our records indicate that this request has already been sent to the patient's insurance plan. To follow up, please call the plan directly.  Valerie Richards (Key: [id number]? Call us at [phone] Outcome Unknown Next Steps The plan will fax you a determination, typically within 1 to 5 business days.  Drug Nurtec 75MG  dispersible tablets Form Letter of Consent Drug Appeal Form Use this universal appeal form to request an appeal if the initial request has been denied. Fax this form to the plan's appeal or authorization department. Letter of Consent from patient REQUIRED.

## 2021-11-24 ENCOUNTER — Telehealth: Payer: Self-pay

## 2021-11-24 NOTE — Telephone Encounter (Signed)
New message  Fax form back to [place]

## 2021-12-08 ENCOUNTER — Other Ambulatory Visit: Payer: 59

## 2021-12-10 NOTE — Telephone Encounter (Signed)
F/u  Received fax from [facility]  Medication Nurtec   Fax additional information back to appeal dept  [phone]

## 2021-12-17 NOTE — Telephone Encounter (Signed)
Letter received, Appeal request is in process it will take up to ten days to process.  ?

## 2021-12-20 ENCOUNTER — Ambulatory Visit: Payer: 59 | Admitting: Physician Assistant

## 2021-12-27 NOTE — Progress Notes (Signed)
? ?NEUROLOGY FOLLOW UP OFFICE NOTE ? ?Valerie Richards ?[id number] ? ?Assessment/Plan:  ? 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  She has tried topiramate and already on 2 antidepressants (sertraline and bupropion).  Will try propranolol.  If ineffective, will try Botox. ?Cervicalgia with left sided cervical radiculopathy  ?Dizziness - unclear etiology.  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  Suspect incidental finding and would not be the cause for dizziness ? ?Migraine prevention:  Continue topiramate '100mg'$  at bedtime as she has had improvement.  Will start propranolol Er '60mg'$  daily (blood pressure lately has been 116-120s/60s).  If ineffective or has side effects, will plan for Botox. ?Migraine rescue:  Nurtec with Zofran ODT '8mg'$ .  ?Repeat MRI of C-spine to follow up on vertebral body lesion. ?Refer to spine specialist/interventional pain for evaluation and management of cervical radiculopathy. ?Keep headache diary ?Continue ASA '81mg'$  daily.  She said it caused bruising.  I wouldn't push her to take it given that it is what I believe to be an incidental finding and otherwise without risk factors.  Recommend that PCP make sure that stroke risk factors such as cholesterol and blood pressure are controlled. ?Follow up 5 months. ?  ?  ?Subjective:  ?Valerie Richards is a 51 year old female with history of iron-deficiency anemia who follows up for migraine and cervical radiculopathy ?  ?UPDATE: ?Increased topiramate in September.   ?Intensity:  moderate to severe ?Duration:  5-6 hours ?Frequency:  20-25 days a month (50% are severe) ? ?MRI of C-spine on 07/07/2021 personally reviewed showed cervical spondylosis with with minimal spinal stenosis at C3-4 and mild bilateral neural foraminal narrowing at C5-6.  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  Recommended repeat imaging in 6 months to follow up.  Has not been performed.  Still with right sided radicular symptoms. ?  ?Current medications: ?Rescue:  Nurtec with Zofran ODT '8mg'$  ?Preventative:  Topiramate '100mg'$  QHS ?Wellbutrin XL sertraline '100mg'$  ?meloxicam ?Estradiol, progesterone ?  ?HISTORY: ?Beginning around September 2021, she started having dizziness.  It is positional.  It is a spinning sensation lasting seconds to a couple of minutes.  Associated with blurred vision and sometimes nausea.  Occurs every other day to twice a day.   ?  ?Around the same time, she developed new onset headaches.  They are severe right frontal pounding headaches associated with nausea, photophobia and sometimes phonophobia.  They sometimes wake her up at night from sleep.  It lasts 2 hours with Tylenol.  They occur every other day.  No known triggers.  However she does have chronic neck pain radiating into the right shoulder.  X-ray cervical spine from 06/17/2020 personally reviewed showed degenerative straightening of the cervical lordosis with focally moderate disc space height loss and osteophytosis of C6-C7.   ? ?MRI brain with and without contrast on 12/31/2020 showed paranasal sinus disease and 3 mm chronic right cerebellar infarct but no acute abnormality.  Advised to start ASA but stopped because it caused bruising.   ? ?She reports aching pain on the left side of her neck and arm, sometimes leg.  Also left eye twitches.  Pain in the left knee and ankle.  Reports numbness and tingling in fingers and toes, particularly when she is feeling anxious.  She reports having physical therapy a couple of months  ago but neck/arm/back pain persists.  ? ?PAST MEDICAL HISTORY: ?Past Medical History:  ?Diagnosis Date  ? Anemia   ? iron deficiency  ? Anxiety   ? Fibroid   ? Stroke [facility]) 12/2020  ? See MRI.  ? ? ?MEDICATIONS: ?Current Outpatient Medications on File Prior to Visit  ?Medication Sig Dispense Refill  ? buPROPion  (WELLBUTRIN XL) 150 MG 24 hr tablet Take 150 mg by mouth at bedtime.    ? CALTRATE 600+D3 SOFT 600-20 MG-MCG CHEW Chew 1 tablet by mouth daily as needed.    ? cyclobenzaprine (FLEXERIL) 10 MG tablet TAKE 1 TABLET BY MOUTH EVERY 8 HOURS AS NEEDED FOR SPASM    ? diclofenac Sodium (VOLTAREN) 1 % GEL Voltaren 1 % topical gel ? APPLY 2 GRAMS TO THE AFFECTED AREA(S) BY TOPICAL ROUTE 4 TIMES PER DAY    ? estradiol (ESTRACE VAGINAL) 0.1 MG/GM vaginal cream Place 1 Applicatorful vaginally 2 (two) times a week. Initial dose: nightly x 2 weeks, then decrease to every other night x 2 weeks, then twice weekly 42.5 g 12  ? famotidine (PEPCID) 20 MG tablet Take 1 tablet (20 mg total) by mouth 2 (two) times daily.    ? fluticasone (FLONASE) 50 MCG/ACT nasal spray Place 1 spray into both nostrils 2 (two) times daily.    ? LINZESS 290 MCG CAPS capsule Take 290 mcg by mouth every morning.    ? meloxicam (MOBIC) 15 MG tablet Take 1 tablet by mouth daily.    ? NONFORMULARY OR COMPOUNDED ITEM Vaginal Vitamin E suppositories 200 U/ml insert one per vaginally twice weekly at bedtime. 8 each 2  ? ondansetron (ZOFRAN ODT) 8 MG disintegrating tablet Take 1 tablet (8 mg total) by mouth every 8 (eight) hours as needed for nausea or vomiting. 20 tablet 5  ? ondansetron (ZOFRAN) 8 MG tablet ondansetron HCl 8 mg tablet    ? pantoprazole (PROTONIX) 40 MG tablet Take 1 tablet (40 mg total) by mouth 2 (two) times daily before a meal. 60 tablet 5  ? Psyllium 0.36 g CAPS Take 1 capsule daily x7 days, then increase to BID    ? Rimegepant Sulfate (NURTEC) 75 MG TBDP Take 75 mg by mouth as needed (Take one tablet as needed at the earlist onset of a Migraine.  Max 1 tablet in 24 hours). 16 tablet 5  ? rizatriptan (MAXALT-MLT) 10 MG disintegrating tablet Take 1 tablet (10 mg total) by mouth as needed for migraine. May repeat in 2 hours if needed 9 tablet 11  ? sertraline (ZOLOFT) 100 MG tablet Take 1 tablet by mouth every morning.    ? sucralfate (CARAFATE)  1 g tablet Dissolve 1 tablet in water to create a slurry and take QID prn 30 tablet 2  ? topiramate (TOPAMAX) 100 MG tablet Take 1 tablet (100 mg total) by mouth at bedtime. 30 tablet 5  ? traZODone (DESYREL) 100 MG tablet     ? triamcinolone ointment (KENALOG) 0.1 % APPLY SMALL AMOUNT OF OINTMENT TOPICALLY TWICE DAILY FOR 14 DAYS THEN TWICE WEEKLY 30 g 1  ? zolpidem (AMBIEN) 5 MG tablet Take 5 mg by mouth at bedtime as needed.    ? ?No current facility-administered medications on file prior to visit.  ? ? ?ALLERGIES: ?No Known Allergies ? ?FAMILY HISTORY: ?Family History  ?Problem Relation Age of Onset  ? Hypertension Mother   ? Cancer Father   ? Diabetes Father   ? Colon cancer Father   ? Kidney  failure Father   ? Diabetes Brother   ? ? ?  ?Objective:  ?Blood pressure 116/61, pulse 62, height '5\' 5"'$  (1.651 m), weight 119 lb (54 kg), last menstrual period 02/26/2020, SpO2 100 %. ?General: No acute distress.  Patient appears well-groomed.   ?Head:  Normocephalic/atraumatic ? ? ?Metta Clines, DO ? ?CC: Rachell Cipro, MD ? ? ? ? ? ? ?

## 2021-12-28 ENCOUNTER — Encounter: Payer: Self-pay | Admitting: Neurology

## 2021-12-28 ENCOUNTER — Other Ambulatory Visit: Payer: Self-pay

## 2021-12-28 ENCOUNTER — Ambulatory Visit (INDEPENDENT_AMBULATORY_CARE_PROVIDER_SITE_OTHER): Payer: 59 | Admitting: Neurology

## 2021-12-28 VITALS — BP 116/61 | HR 62 | Ht 65.0 in | Wt 119.0 lb

## 2021-12-28 DIAGNOSIS — G43709 Chronic migraine without aura, not intractable, without status migrainosus: Secondary | ICD-10-CM

## 2021-12-28 DIAGNOSIS — M5412 Radiculopathy, cervical region: Secondary | ICD-10-CM | POA: Diagnosis not present

## 2021-12-28 MED ORDER — PROPRANOLOL HCL ER 60 MG PO CP24: 60.0000 mg | ORAL_CAPSULE | Freq: Every day | ORAL | 5 refills | Status: DC

## 2021-12-28 NOTE — Patient Instructions (Addendum)
Continue topiramate for now. ?Start propranolol ER '60mg'$  daily.  If no improvement in 4 weeks, contact me and we can increase dose.   ?Use Nurtec and ondansetron as needed for acute headache attacks ?Will refer you to spine/interventional pain specialist for evaluation and appropriate treatment of pinched nerve in neck.   ?Follow up 5 months or sooner ?

## 2021-12-28 NOTE — Addendum Note (Signed)
Addended by: Elspeth Cho R on: 12/28/2021 10:49 AM ? ? Modules accepted: Orders ? ?

## 2021-12-30 ENCOUNTER — Ambulatory Visit: Payer: 59 | Admitting: Neurology

## 2021-12-30 ENCOUNTER — Other Ambulatory Visit: Payer: Self-pay

## 2021-12-30 ENCOUNTER — Telehealth (HOSPITAL_COMMUNITY): Payer: Self-pay | Admitting: Psychiatry

## 2021-12-30 ENCOUNTER — Ambulatory Visit (INDEPENDENT_AMBULATORY_CARE_PROVIDER_SITE_OTHER): Payer: Self-pay | Admitting: Licensed Clinical Social Worker

## 2021-12-30 DIAGNOSIS — F323 Major depressive disorder, single episode, severe with psychotic features: Secondary | ICD-10-CM
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
## 2021-12-30 NOTE — Telephone Encounter (Signed)
D:  Valerie Garrot, LCSW, LCAS referred pt to [facility].  A:  Placed call to orient pt.  Pt states she just arrived home and would like to verify her insurance benefits before making a decision.  Encouraged pt to call the case mgr after she talks to the insurance co.  [place].  R:  Pt receptive. ?

## 2022-01-10 ENCOUNTER — Encounter: Payer: Self-pay | Admitting: Physical Medicine & Rehabilitation

## 2022-02-01 ENCOUNTER — Telehealth (HOSPITAL_COMMUNITY): Payer: Self-pay | Admitting: Psychiatry

## 2022-02-05 ENCOUNTER — Encounter (HOSPITAL_COMMUNITY): Payer: Self-pay | Admitting: Psychiatry

## 2022-02-05 ENCOUNTER — Ambulatory Visit (HOSPITAL_BASED_OUTPATIENT_CLINIC_OR_DEPARTMENT_OTHER): Payer: Self-pay | Admitting: Psychiatry

## 2022-02-05 VITALS — Wt 119.0 lb

## 2022-02-05 DIAGNOSIS — F323 Major depressive disorder, single episode, severe with psychotic features: Secondary | ICD-10-CM

## 2022-02-05 DIAGNOSIS — F410 Panic disorder [episodic paroxysmal anxiety] without agoraphobia: Secondary | ICD-10-CM

## 2022-02-05 MED ORDER — ARIPIPRAZOLE 5 MG PO TABS: ORAL_TABLET | ORAL | 0 refills | Status: DC
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
## 2022-03-03 ENCOUNTER — Encounter: Payer: Self-pay | Admitting: Physical Medicine & Rehabilitation

## 2022-03-03 ENCOUNTER — Encounter: Payer: 59 | Attending: Physical Medicine & Rehabilitation | Admitting: Physical Medicine & Rehabilitation

## 2022-03-03 ENCOUNTER — Other Ambulatory Visit: Payer: Self-pay

## 2022-03-03 VITALS — BP 97/66 | HR 62 | Ht 65.0 in | Wt 118.0 lb

## 2022-03-03 DIAGNOSIS — M797 Fibromyalgia: Secondary | ICD-10-CM

## 2022-03-03 MED ORDER — PREGABALIN 50 MG PO CAPS: 50.0000 mg | ORAL_CAPSULE | Freq: Two times a day (BID) | ORAL | 1 refills | Status: DC

## 2022-03-03 NOTE — Telephone Encounter (Signed)
Last AEX 10/05/20--has been seen since by you and TW for office visits but no annual. You had Rxd this for her last in 05/2021. Is not scheduled for an annual either. Will refuse for now and inform to contact office to make appt.

## 2022-03-03 NOTE — Progress Notes (Signed)
Subjective:    Patient ID: Valerie Richards, female    DOB: 1970-10-27, 51 y.o.   MRN: [id number]  HPI CC:  Pain all over 51 yo female with hx of depression and migraine headaches referrred for evaluation of neck and arm pain  On Nurtec and propranolol for migraine, has not tried Botox but her neurologist has recommended as next step   Numbness and tingling in fingers and toes as well as arthritis pain , takes meloxicam   Neck pain Left elbow and wrist pain for several months Had PT last year for a fall at work ~[employer].    Has not been on gabapentin or pregabalin per her report  "Poor memory" blames after effects of Ambien which she take at hs  Activity related pain   Brain MRI in 2022 Right cerebellar infarct chronic appearing   No exercise due to pain  Takes meloxicam for arthritis, "sometimes helps"  Sees psychiatry for depression On Wellbutrin and Abilitfy  CLINICAL DATA:  Cervical radiculopathy. Cervical radiculopathy, no red flags. Additional history provided by scanning technologist: Patient reports neck pain for 7 years.   EXAM: MRI CERVICAL SPINE WITHOUT CONTRAST   TECHNIQUE: Multiplanar, multisequence MR imaging of the cervical spine was performed. No intravenous contrast was administered.   COMPARISON:  Radiographs of the cervical spine 06/17/2020.   FINDINGS: Intermittently motion degraded examination. Most notably, there is mild-to-moderate motion degradation of the axial T2 TSE sequence and moderate motion degradation of the sagittal T1 sequence.   Alignment: Reversal of the expected cervical lordosis. Trace C5-C6 grade 1 retrolisthesis.   Vertebrae: Vertebral body height is maintained. Nonspecific 5 mm round T2/STIR hyperintense and T1 hypointense lesion within the C7 vertebral body. Elsewhere, no significant marrow edema or focal suspicious osseous lesion is identified.   Cord: Within the limitations of motion degradation, no spinal cord signal  abnormality is identified.   Posterior Fossa, vertebral arteries, paraspinal tissues: No abnormality identified within included portions of the posterior fossa. Flow voids preserved within the imaged cervical vertebral arteries. Paraspinal soft tissues unremarkable.   Disc levels:   Moderate disc degeneration at C5-C6. No more than mild disc degeneration at the remaining levels.   C2-C3: Minimal facet arthrosis. No significant disc herniation or stenosis.   C3-C4: Small central disc protrusion. Mild facet arthrosis. The disc protrusion focally effaces the ventral thecal sac, resulting in minimal relative spinal canal narrowing and contacting the ventral spinal cord. No significant foraminal narrowing.   C4-C5: No significant disc herniation or stenosis.   C5-C6: Trace grade 1 retrolisthesis. Posterior disc osteophyte complex with bilateral disc osteophyte ridge/uncinate hypertrophy. Mild facet arthrosis. No significant spinal canal stenosis. Mild bilateral neural foraminal narrowing.   C6-C7: Shallow disc bulge. No significant spinal canal or foraminal stenosis.   C7-T1: No significant disc herniation or stenosis.   Impression #5 will be called to the ordering clinician or representative by the Radiologist Assistant, and communication documented in the PACS or [employer]   IMPRESSION: Motion degraded exam.   Cervical spondylosis, as outlined and with findings most notably as follows.   At C5-C6, there is trace grade 1 retrolisthesis. Moderate disc degeneration. Posterior disc osteophyte complex with bilateral disc osteophyte ridge/uncinate hypertrophy. Mild facet arthrosis. No significant spinal canal stenosis. Mild bilateral neural foraminal narrowing.   At C3-C4, there is a small central disc protrusion which focally effaces the ventral thecal sac, resulting in minimal relative spinal canal narrowing, and contacting the ventral spinal cord.  5 mm lesion  within the C7 vertebral body, as described. While this may reflect an atypical hemangioma, the imaging features are nonspecific and this finding remains indeterminate. Consider a six-month follow-up cervical spine MRI to ensure stability.   Reversal of the expected cervical lordosis.     Electronically Signed   By: Kellie Simmering D.O.   On: 07/08/2021 09:46   Pain Inventory Average Pain 10 Pain Right Now 8 My pain is intermittent, constant, and sharp  LOCATION OF PAIN  shoulder, head, fingers, back, knee, toes  BOWEL Number of stools per week: 0-1 Oral laxative use No  Type of laxative . Enema or suppository use No  History of colostomy No  Incontinent No   BLADDER Normal In and out cath, frequency . Able to self cath  . Bladder incontinence No  Frequent urination Yes  Leakage with coughing Yes  Difficulty starting stream Yes  Incomplete bladder emptying No    Mobility how many minutes can you walk? 10 ability to climb steps?  yes do you drive?  yes  Function not employed: date last employed 08/23/21 I need assistance with the following:  dressing, bathing, meal prep, household duties, and shopping  Neuro/Psych bladder control problems bowel control problems weakness numbness trouble walking dizziness confusion depression anxiety loss of taste or smell  Prior Studies New pt  Physicians involved in your care New Pt   Family History  Problem Relation Age of Onset   Hypertension Mother    Cancer Father    Diabetes Father    Colon cancer Father    Kidney failure Father    Diabetes Brother    Social History   Socioeconomic History   Marital status: Widowed    Spouse name: Not on file   Number of children: Not on file   Years of education: Not on file   Highest education level: Not on file  Occupational History   Occupation: Scientist, water quality    Comment: [employer]  Tobacco Use   Smoking status: Former    Packs/day: 0.25    Years:  8.00    Pack years: 2.00    Types: Cigarettes    Quit date: 10/10/2012    Years since quitting: 9.4   Smokeless tobacco: Never  Vaping Use   Vaping Use: Never used  Substance and Sexual Activity   Alcohol use: No    Alcohol/week: 0.0 standard drinks   Drug use: No   Sexual activity: Not Currently    Birth control/protection: Post-menopausal  Other Topics Concern   Not on file  Social History Narrative   Widowed 10-Dec-2015. Husband died from Petronila disease.    Right handed drinks caffeine   2 story home   Social Determinants of Health   Financial Resource Strain: Not on file  Food Insecurity: Not on file  Transportation Needs: Not on file  Physical Activity: Not on file  Stress: Not on file  Social Connections: Not on file   Past Surgical History:  Procedure Laterality Date   EYE SURGERY Bilateral    TONSILLECTOMY     Past Medical History:  Diagnosis Date   Anemia    iron deficiency   Anxiety    Fibroid    Stroke ([place]) 12/2020   See MRI.   BP 97/66   Pulse 62   Ht '5\' 5"'$  (1.651 m)   Wt 118 lb (53.5 kg)   LMP 02/26/2020 (Exact Date)   SpO2 100%   BMI 19.64  kg/m   Opioid Risk Score:   Fall Risk Score:  `1  Depression screen [facility] 2/9     12/30/2021   11:40 AM 11/23/2015    4:42 PM 05/26/2015    9:08 AM  Depression screen PHQ 2/9  Decreased Interest  3 0  Down, Depressed, Hopeless  1 1  PHQ - 2 Score  4 1  Altered sleeping  0   Tired, decreased energy  3   Change in appetite  1   Feeling bad or failure about yourself   1   Trouble concentrating  0   Moving slowly or fidgety/restless  0   Suicidal thoughts  0   PHQ-9 Score  9   Difficult doing work/chores  Not difficult at all      Information is confidential and restricted. Go to Review Flowsheets to unlock data.     Review of Systems  Musculoskeletal:  Positive for back pain and gait problem.       Bilateral shoulder pain Arm pain Bilateral knee pain  Neurological:  Positive for dizziness,  weakness and numbness.  Psychiatric/Behavioral:  Positive for confusion and dysphoric mood. The patient is nervous/anxious.   All other systems reviewed and are negative.     Objective:   Physical Exam Vitals and nursing note reviewed.  Constitutional:      Appearance: She is normal weight.  HENT:     Head: Normocephalic and atraumatic.  Eyes:     Extraocular Movements: Extraocular movements intact.     Conjunctiva/sclera: Conjunctivae normal.     Pupils: Pupils are equal, round, and reactive to light.  Neurological:     Mental Status: She is alert and oriented to person, place, and time.     Cranial Nerves: No dysarthria or facial asymmetry.     Sensory: Sensation is intact.     Motor: No weakness, atrophy or abnormal muscle tone.     Coordination: Romberg sign negative. Coordination normal. Finger-Nose-Finger Test and Heel to [facility] Test normal. Rapid alternating movements normal.     Gait: Gait is intact.  Psychiatric:        Mood and Affect: Mood normal.        Behavior: Behavior normal.   Motor strength is 5/5 bilateral lateral deltoid, bicep, tricep, grip, hip flexor, knee extensor, ankle dorsiflexion plantarflexion Cervical range of motion is normal she does have increased pain with cervical extension.  This pain is mainly left-sided in the trap area. Sensation intact to light touch bilateral C5 C6-C7-C8 distribution No hand intrinsic atrophy Tone is normal bilateral upper and lower limbs  Negative foraminal compression test bilaterally MSK Tenderness palpation bilateral upper traps bilateral cervical thoracic and lumbar paraspinal area bilateral VMO bilateral greater trochanter of the hip Left medial and lateral epicondyle of the elbow        Assessment & Plan:  1.  Widespread body pain she also gives a history of finger paresthesias abnormal sensitivity to pain at multiple locations in the torso as well as extremities., no evidence of joint swelling.  She has  comorbidities of depression. Impression is probable fibromyalgia syndrome.  Discussed diagnosis with patient including the underlying pathophysiology.  Would recommend starting pregabalin 50 mg twice daily will likely need to titrate upward. We discussed the importance of exercise and spite of exercise related pain.  We will make referral to aquatic physical therapy.  This should be better tolerated 2.  Cervical pain no clear-cut signs of radiculopathy.  She does have trap  tenderness as well as elbow tenderness which may be fibromyalgia related plus minus medial and lateral epicondylitis.  Do not think cervical epidural is needed at this time.  She may benefit from cervical medial branch blocks but would first maximize medical treatment of fibromyalgia. Reviewed MRI dated 06/29/2021 no significant compressive lesions in the central canal, only mild foraminal stenosis at C5-C6

## 2022-03-03 NOTE — Patient Instructions (Signed)
Myofascial Pain Syndrome and Fibromyalgia ?Myofascial pain syndrome and fibromyalgia are both pain disorders. You may feel this pain mainly in your muscles. ?Myofascial pain syndrome: ?Always has tender points in the muscles that will cause pain when pressed (trigger points). The pain may come and go. ?Usually affects your neck, upper back, and shoulder areas. The pain often moves into your arms and hands. ?Fibromyalgia: ?Has muscle pains and tenderness that come and go. ?Is often associated with tiredness (fatigue) and sleep problems. ?Has trigger points. ?Tends to be long-lasting (chronic), but is not life-threatening. ?Fibromyalgia and myofascial pain syndrome are not the same. However, they often occur together. If you have both conditions, each can make the other worse. Both are common and can cause enough pain and fatigue to make day-to-day activities difficult. Both can be hard to diagnose because their symptoms are common in many other conditions. ?What are the causes? ?The exact causes of these conditions are not known. ?What increases the risk? ?You are more likely to develop either of these conditions if: ?You have a family history of the condition. ?You are female. ?You have certain triggers, such as: ?Spine disorders. ?An injury (trauma) or other physical stressors. ?Being under a lot of stress. ?Medical conditions such as osteoarthritis, rheumatoid arthritis, or lupus. ?What are the signs or symptoms? ?Fibromyalgia ?The main symptom of fibromyalgia is widespread pain and tenderness in your muscles. Pain is sometimes described as stabbing, shooting, or burning. ?You may also have: ?Tingling or numbness. ?Sleep problems and fatigue. ?Problems with attention and concentration (fibro fog). ?Other symptoms may include: ?Bowel and bladder problems. ?Headaches. ?Vision problems. ?Sensitivity to odors and noises. ?Depression or mood changes. ?Painful menstrual periods (dysmenorrhea). ?Dry skin or eyes. ?These  symptoms can vary over time. ?Myofascial pain syndrome ?Symptoms of myofascial pain syndrome include: ?Tight, ropy bands of muscle. ?Uncomfortable sensations in muscle areas. These may include aching, cramping, burning, numbness, tingling, and weakness. ?Difficulty moving certain parts of the body freely (poor range of motion). ?How is this diagnosed? ?This condition may be diagnosed by your symptoms and medical history. You will also have a physical exam. In general: ?Fibromyalgia is diagnosed if you have pain, fatigue, and other symptoms for more than 3 months, and symptoms cannot be explained by another condition. ?Myofascial pain syndrome is diagnosed if you have trigger points in your muscles, and those trigger points are tender and cause pain elsewhere in your body (referred pain). ?How is this treated? ?Treatment for these conditions depends on the type that you have. ?For fibromyalgia a healthy lifestyle is the most important treatment including aerobic and strength exercises. Different types of medicines are used to help treat pain and include: ?NSAIDs. ?Medicines for treating depression. ?Medicines that help control seizures. ?Medicines that relax the muscles. ?Treatment for myofascial pain syndrome includes: ?Pain medicines, such as NSAIDs. ?Cooling and stretching of muscles. ?Massage therapy with myofascial release technique. ?Trigger point injections. ?Treating these conditions often requires a team of health care providers. These may include: ?Your primary care provider. ?A physical therapist. ?Complementary health care providers, such as massage therapists or acupuncturists. ?A psychiatrist for cognitive behavioral therapy. ?Follow these instructions at home: ?Medicines ?Take over-the-counter and prescription medicines only as told by your health care provider. ?Ask your health care provider if the medicine prescribed to you: ?Requires you to avoid driving or using machinery. ?Can cause constipation.  You may need to take these actions to prevent or treat constipation: ?Drink enough fluid to keep your urine pale   yellow. ?Take over-the-counter or prescription medicines. ?Eat foods that are high in fiber, such as beans, whole grains, and fresh fruits and vegetables. ?Limit foods that are high in fat and processed sugars, such as fried or sweet foods. ?Lifestyle ? ?Do exercises as told by your health care provider or physical therapist. ?Practice relaxation techniques to control your stress. You may want to try: ?Biofeedback. ?Visual imagery. ?Hypnosis. ?Muscle relaxation. ?Yoga. ?Meditation. ?Maintain a healthy lifestyle. This includes eating a healthy diet and getting enough sleep. ?Do not use any products that contain nicotine or tobacco. These products include cigarettes, chewing tobacco, and vaping devices, such as e-cigarettes. If you need help quitting, ask your health care provider. ?General instructions ?Talk to your health care provider about complementary treatments, such as acupuncture or massage. ?Do not do activities that stress or strain your muscles. This includes repetitive motions and heavy lifting. ?Keep all follow-up visits. This is important. ?Where to find support ?Consider joining a support group with others who are diagnosed with this condition. ?National Fibromyalgia Association: www.fmaware.org ?Where to find more information ?American Chronic Pain Association: www.theacpa.org ?Contact a health care provider if: ?You have new symptoms. ?Your symptoms get worse or your pain is severe. ?You have side effects from your medicines. ?You have trouble sleeping. ?Your condition is causing depression or anxiety. ?Get help right away if: ?You have thoughts of hurting yourself or others. ?Get help right awayif you feel like you may hurt yourself or others, or have thoughts about taking your own life. Go to your nearest emergency room or: ?Call 911. ?Call the National Suicide Prevention Lifeline at  1-800-273-8255 or 988. This is open 24 hours a day. ?Text the Crisis Text Line at 741741. ?Summary ?Myofascial pain syndrome and fibromyalgia are pain disorders. ?Myofascial pain syndrome has tender points in the muscles that will cause pain when pressed (trigger points). Fibromyalgia also has muscle pains and tenderness that come and go, but this condition is often associated with fatigue and sleep disturbances. ?Fibromyalgia and myofascial pain syndrome are not the same but often occur together, causing pain and fatigue that make day-to-day activities difficult. ?Follow your health care provider's instructions for taking medicines and maintaining a healthy lifestyle. ?This information is not intended to replace advice given to you by your health care provider. Make sure you discuss any questions you have with your health care provider. ?Document Revised: 08/27/2021 Document Reviewed: 08/27/2021 ?Elsevier Patient Education ? 2023 Elsevier Inc. ? ?

## 2022-03-21 ENCOUNTER — Encounter (HOSPITAL_COMMUNITY): Payer: Self-pay | Admitting: Psychiatry

## 2022-03-21 ENCOUNTER — Telehealth (HOSPITAL_BASED_OUTPATIENT_CLINIC_OR_DEPARTMENT_OTHER): Payer: Self-pay | Admitting: Psychiatry

## 2022-03-21 VITALS — Wt 119.0 lb

## 2022-03-21 DIAGNOSIS — F419 Anxiety disorder, unspecified: Secondary | ICD-10-CM

## 2022-03-21 DIAGNOSIS — F323 Major depressive disorder, single episode, severe with psychotic features: Secondary | ICD-10-CM

## 2022-03-21 MED ORDER — ARIPIPRAZOLE 5 MG PO TABS: ORAL_TABLET | ORAL | 0 refills | Status: DC

## 2022-03-21 MED ORDER — TRAZODONE HCL 150 MG PO TABS: 150.0000 mg | ORAL_TABLET | Freq: Every day | ORAL | 0 refills | Status: DC

## 2022-03-21 MED ORDER — SERTRALINE HCL 100 MG PO TABS: 100.0000 mg | ORAL_TABLET | Freq: Every morning | ORAL | 0 refills | Status: DC
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
## 2022-03-29 ENCOUNTER — Encounter (HOSPITAL_BASED_OUTPATIENT_CLINIC_OR_DEPARTMENT_OTHER): Payer: Self-pay | Admitting: Physical Therapy

## 2022-03-29 ENCOUNTER — Ambulatory Visit (HOSPITAL_BASED_OUTPATIENT_CLINIC_OR_DEPARTMENT_OTHER): Payer: 59 | Attending: Physical Medicine & Rehabilitation | Admitting: Physical Therapy

## 2022-03-29 DIAGNOSIS — M797 Fibromyalgia: Secondary | ICD-10-CM | POA: Insufficient documentation

## 2022-03-29 DIAGNOSIS — M6281 Muscle weakness (generalized): Secondary | ICD-10-CM | POA: Insufficient documentation

## 2022-03-29 DIAGNOSIS — M5459 Other low back pain: Secondary | ICD-10-CM | POA: Diagnosis present

## 2022-03-29 NOTE — Therapy (Signed)
OUTPATIENT PHYSICAL THERAPY NEURO EVALUATION   Patient Name: Valerie Richards MRN: [id number] DOB:1971/07/14, 51 y.o., female Today's Date: 03/29/2022   PCP: Fanny Bien, MD REFERRING PROVIDER: Alysia Penna MD   PT End of Session -     Visit Number 1   Number of Visits 16   Date for PT Re-Evaluation 05/24/2022   Authorization Type Friday   PT Start Time 0900   PT Stop Time 0945   PT Time Calculation (min) 45 min   Activity Tolerance Patient tolerated treatment well   Behavior During Therapy  [facility] for tasks assessed/performed     Past Medical History:  Diagnosis Date   Anemia    iron deficiency   Anxiety    Fibroid    Stroke ([place]) 12/2020   See MRI.   Past Surgical History:  Procedure Laterality Date   EYE SURGERY Bilateral    TONSILLECTOMY      ONSET DATE: 5/23  REFERRING DIAG: M79.7 (ICD-10-CM) - Fibromyalgia  THERAPY DIAG:  Fibromyalgia  Muscle weakness (generalized)  Other low back pain  Rationale for Evaluation and Treatment Rehabilitation  SUBJECTIVE:                                                                                                                                                                                              SUBJECTIVE STATEMENT: Husband had ALS took care of him for 7 years until passing, pain throughout that time; right shoulder ,bilat shoulder blades and cervical spine pain, knees R>L, feet cramping daily.  "I am scared I am going to fall so I don't move much".  House chores are completed with many rest periods because I get tired. Pt accompanied by: self  PERTINENT HISTORY: Myofascial pain syndrome, Cervical pain , migraines, CVA 2022, depression  PAIN:  Are you having pain? Yes: NPRS scale: 5/10 least, 8/10 Pain location: R shoulder and cervical spine Pain description: Constant. jabbing, burning Aggravating factors: holding grandchild, combing hair, cleaning Relieving factors: OTC and narcotic  meds,rest,Heat  PRECAUTIONS: None  WEIGHT BEARING RESTRICTIONS No  FALLS: Has patient fallen in last 6 months? Yes. Number of falls 1 Slipped down 5 steps  LIVING ENVIRONMENT: Lives with: lives with their family and lives alone Lives in: House/apartment Stairs: Yes: Internal: 16 steps; on right going up Has following equipment at home: None  PLOF: Independent  PATIENT GOALS Want to enjoy the rest of my life, go out, play with grand child  OBJECTIVE:   DIAGNOSTIC FINDINGS: MRI dated 06/29/2021 no significant compressive lesions in the central canal, only mild foraminal stenosis at C5-C6  COGNITION: Overall  cognitive status: Within functional limits for tasks assessed   SENSATION: Parasthesia bilat hands and feet  COORDINATION: wfl  EDEMA:  none  MUSCLE TONE: WFL   MUSCLE LENGTH: wfl    POSTURE: rounded shoulders, build very slight  LOWER EXTREMITY ROM:    WFL  LOWER EXTREMITY MMT:    MMT Right Eval Left Eval  Hip flexion 3+ 3+  Hip extension    Hip abduction    Hip adduction    Hip internal rotation    Hip external rotation    Knee flexion 3+ 4-  Knee extension 4 4  Ankle dorsiflexion 4 4  Ankle plantarflexion Causes cramping Causes cramping  Ankle inversion    Ankle eversion    (Blank rows = not tested) UE ROM : WFL Strength bilat shoulders 3+/5 MMT limited throughout due to pain  Lumbar spine ROM: forward flex limited 50% by pain  BED MOBILITY:  Completes indep but slow with c/o pain in back  TRANSFERS: Assistive device utilized: None  Sit to stand: Complete Independence Stand to sit: Complete Independence Chair to chair: Complete Independence Floor: CGA    GAIT: Gait pattern: WFL Distance walked: 500 Assistive device utilized: None Level of assistance: Complete Independence Comments: Cadence slowed, minimal arm swing, guarded posture  FUNCTIONAL TESTs:  5 times sit to stand: 32 s Timed up and go (TUG): 15s Berg Balance  Scale: 28/56 BERG Balance Test          Date: 03/29/22  Sit to Stand 4  Standing unsupported 4  Sitting with back unsupported but feet supported 4  Stand to sit  3  Transfers  3  Standing unsupported with eyes closed 2  Standing unsupported feet together 1  From standing position, reach forward with outstretched arm 2  From standing position, pick up object from floor 0  From standing position, turn and look behind over each shoulder 1  Turn 360 1  Standing unsupported, alternately place foot on step 2  Standing unsupported, one foot in front 0  Standing on one leg 0  Total:  28    PATIENT SURVEYS:  FOTO 37 predicted outcome 45 visit #13  TODAY'S TREATMENT:  Evaluation Objective testing   PATIENT EDUCATION: Education details: anatomy of management of condition Person educated: Patient Education method: Explanation Education comprehension: verbalized understanding   HOME EXERCISE PROGRAM: To be assigned    GOALS: Goals reviewed with patient? Yes  SHORT TERM GOALS: Target date: 04/26/2022  Pt will tolerate 30+ minutes of aquatic therapy Baseline:Has not begun Goal status: INITIAL  2.  5 Times STS to improve to < 22s to demonstrate improving strength Baseline:32s Goal status: INITIAL  3.  Berg Balance score to improve to 38/56 or > to demonstrate improvement in balance and safety Baseline: 28/56 Goal status: INITIAL  4.  Pt will tolerate walking through grocery store without increased fatigue Baseline: unable to complete Goal status: INITIAL   LONG TERM GOALS: Target date: 05/24/2022  Foto to meet stated outcome Baseline: 3 Goal status: INITIAL  2.  Pt will improve in strength of LE 1 full grade or > to demonstrate improved and safe functional mobility. Baseline: hips 3+/5; knees 3- to 4/5 Goal status: INITIAL  3.  Pt will improve lumbar forward flex ROM to 90% to allow for reaching to floor for cleaning and playing with grand child  purposes Baseline: 50% limited Goal status: INITIAL  4.  Pt will improve Berg balance score to 45/56 or greater to  demonstrate decreased fall risk Baseline: 28/56 Goal status: INITIAL   ASSESSMENT:  CLINICAL IMPRESSION: Patient is a 51 y.o. Female who was seen today for physical therapy evaluation and treatment for Fibromyalgia. English is her second language but she communicates well. She has had progressive pain and fatigue going back 7+ years. Fibromyalgia new dx for which she is on new meds.  She reports some decrease in sx since starting them (2-3 weeks). With testing pt is weak throughout le's and core along with weakness and discomfort in right shoulder, upper back and cervical area. No significant findings on MRI.  She is moderately sensitive to palpation throughout bilat upper trap and posterior cervical area R>L.  She is of very slight build.  Pt will benefit from aquatic therapy using properties of water to progress towards goals. Will consider on land treatments/transition as strength improves and is deemed appropriate.   OBJECTIVE IMPAIRMENTS decreased activity tolerance, decreased balance, decreased endurance, decreased strength, and pain.   ACTIVITY LIMITATIONS carrying, lifting, bending, stairs, bed mobility, and reach over head  PARTICIPATION LIMITATIONS: meal prep, cleaning, laundry, shopping, and community activity  PERSONAL FACTORS Time since onset of injury/illness/exacerbation and 1-2 comorbidities: migraines, CVA, depression  are also affecting patient's functional outcome.   REHAB POTENTIAL: Good  CLINICAL DECISION MAKING: Evolving/moderate complexity  EVALUATION COMPLEXITY: Moderate  PLAN: PT FREQUENCY: 1-2x/week  PT DURATION: 8 Weeks  PLANNED INTERVENTIONS: Therapeutic exercises, Therapeutic activity, Neuromuscular re-education, Balance training, Gait training, Patient/Family education, Joint mobilization, Stair training, Aquatic Therapy, Dry Needling,  Spinal mobilization, Cryotherapy, Moist heat, and Taping  PLAN FOR NEXT SESSION: begin aquatics; core and LE strengthening   Stanton Kidney (Frankie) Keasha Malkiewicz MPT 03/29/2022, 1:52 PM

## 2022-04-11 ENCOUNTER — Telehealth (HOSPITAL_BASED_OUTPATIENT_CLINIC_OR_DEPARTMENT_OTHER): Payer: Self-pay | Admitting: Psychiatry

## 2022-04-11 ENCOUNTER — Encounter (HOSPITAL_COMMUNITY): Payer: Self-pay | Admitting: Psychiatry

## 2022-04-11 VITALS — Wt 119.0 lb

## 2022-04-11 DIAGNOSIS — F419 Anxiety disorder, unspecified: Secondary | ICD-10-CM

## 2022-04-11 DIAGNOSIS — F323 Major depressive disorder, single episode, severe with psychotic features: Secondary | ICD-10-CM

## 2022-04-11 MED ORDER — AMITRIPTYLINE HCL 25 MG PO TABS: 25.0000 mg | ORAL_TABLET | Freq: Every day | ORAL | 1 refills | Status: DC

## 2022-04-11 MED ORDER — ARIPIPRAZOLE 5 MG PO TABS: ORAL_TABLET | ORAL | 1 refills | Status: DC

## 2022-04-11 NOTE — Progress Notes (Signed)
Virtual Visit via Video Note  I connected with Valerie Richards on 04/11/22 at  3:40 PM EDT by a video enabled telemedicine application and verified that I am speaking with the correct person using two identifiers.  Location: Patient: Home Provider: Home Office   I discussed the limitations of evaluation and management by telemedicine and the availability of in person appointments. The patient expressed understanding and agreed to proceed.  History of Present Illness: Patient is evaluated by video session.  We have requested in person visit on the last session but patient told her daughter coming from [place] and she is very busy taking care of grandkids.  She has 6 grandkids who are living with her.  Patient admitted that she cannot handle very much and now her daughter is going back to [place] in few weeks.  Patient is now taking Abilify and trazodone.  She also taking blood pressure medicine propanolol and headache medicine.  She had stopped taking all her medication because she was getting very confused and we have recommended to simplify and organize the pillbox so she do not overmedicate herself.  In the past she had tried mirtazapine, Prozac, Lexapro, Effexor and recently sertraline but not consistent with the medication.  Now she like to give medication some time and she noticed some improvement with Abilify and trazodone.  She is still only sleeping 3 hours but her mood somewhat better.  She has racing thoughts, crying spells, but no suicidal thoughts.  She does not go outside and does not talk to people.  She gets easily overwhelmed and has few panic attacks.  She had applied for disability and she has appointment to see a physician from the disability end of this month.  She has not bring the paperwork to our office.  She has no tremors, shakes or any EPS.  But she still have a lot of depression and anxiety but no delusions or hallucinations.  Her appetite is fair.  She has nightmares,  flashback but denies drinking or using any illegal substances.  Patient is very concerned about her financial situation as patient has no insurance and other needs to take care of herself.   Past Psychiatric History: History of depression, anxiety, childhood trauma and panic attack.  Saw provider few years ago at [facility] and prescribed Lexapro, Prozac, Effexor, mirtazapine but did not felt it helped.  Prescribed Zoloft, Ambien and hydroxyzine by PCP.  She was recommended inpatient years ago but did not stay in the hospital.  Psychiatric Specialty Exam: Physical Exam  Review of Systems  Weight 119 lb (54 kg), last menstrual period 02/26/2020.There is no height or weight on file to calculate BMI.  General Appearance: Casual  Eye Contact:  Fair  Speech:  Slow  Volume:  Decreased  Mood:  Anxious and Dysphoric  Affect:   improved  Thought Process:  Descriptions of Associations: Intact  Orientation:  Full (Time, Place, and Person)  Thought Content:  Rumination  Suicidal Thoughts:  No  Homicidal Thoughts:  No  Memory:  Immediate;   Fair Recent;   Fair Remote;   Fair  Judgement:  Fair  Insight:  Shallow  Psychomotor Activity:  Decreased  Concentration:  Concentration: Fair and Attention Span: Fair  Recall:  [employer] of Knowledge:  Fair  Language:  Fair  Akathisia:  No  Handed:  Right  AIMS (if indicated):     Assets:  Communication Skills Desire for Improvement Housing Transportation  ADL's:  Intact  Cognition:  WNL  Sleep:   3 hours      Assessment and Plan: Major depressive disorder, severe.  Anxiety.  Patient is not consistent with at least Abilify and trazodone.  She is also not taking Lyrica, muscle relaxant.  She is trying to be consistent with the medication and like to keep her blood pressure medication, headache medication and depressive medication.  We talk about switching trazodone to amitriptyline to help her sleep, depression, anxiety.  I recommend try  amitriptyline 25 mg-50 mg at bedtime.  Recommend to continue Abilify 5 mg daily.  I encouraged to keep appointment in the future for better management.  I also encouraged to have in person visit in 4 to 6 weeks.  Recommended to call us back if there is any question or any concern.  Follow-up in 6 weeks.  Follow Up Instructions:    I discussed the assessment and treatment plan with the patient. The patient was provided an opportunity to ask questions and all were answered. The patient agreed with the plan and demonstrated an understanding of the instructions.   The patient was advised to call back or seek an in-person evaluation if the symptoms worsen or if the condition fails to improve as anticipated.  I provided 25 minutes of non-face-to-face time during this encounter.   Kathlee Nations, MD

## 2022-04-14 ENCOUNTER — Other Ambulatory Visit (HOSPITAL_COMMUNITY): Payer: Self-pay | Admitting: Psychiatry

## 2022-04-14 DIAGNOSIS — F419 Anxiety disorder, unspecified: Secondary | ICD-10-CM

## 2022-04-14 DIAGNOSIS — F323 Major depressive disorder, single episode, severe with psychotic features: Secondary | ICD-10-CM

## 2022-04-19 ENCOUNTER — Encounter: Payer: 59 | Admitting: Physical Medicine & Rehabilitation

## 2022-05-05 ENCOUNTER — Other Ambulatory Visit (HOSPITAL_COMMUNITY): Payer: Self-pay | Admitting: *Deleted

## 2022-05-05 DIAGNOSIS — F419 Anxiety disorder, unspecified: Secondary | ICD-10-CM

## 2022-05-05 DIAGNOSIS — F323 Major depressive disorder, single episode, severe with psychotic features: Secondary | ICD-10-CM

## 2022-05-12 ENCOUNTER — Telehealth (HOSPITAL_COMMUNITY): Payer: Self-pay | Admitting: *Deleted

## 2022-05-12 NOTE — Telephone Encounter (Signed)
Progress notes faxed to [facility] for review. Pt has signed an ROI. Awaiting response regarding pt application for disability.

## 2022-05-20 ENCOUNTER — Other Ambulatory Visit: Payer: Self-pay

## 2022-05-20 ENCOUNTER — Telehealth: Payer: Self-pay | Admitting: Neurology

## 2022-05-20 NOTE — Telephone Encounter (Signed)
1. Which medications need refilled? (List name and dosage, if known) rizatriptan and propranolol  2. Which pharmacy/location is medication to be sent to? (include street and city if local pharmacy) [facility]  Patient wants to use mail order now

## 2022-05-23 MED ORDER — RIZATRIPTAN BENZOATE 10 MG PO TBDP: 10.0000 mg | ORAL_TABLET | ORAL | 1 refills | Status: DC | PRN

## 2022-05-23 MED ORDER — PROPRANOLOL HCL ER 60 MG PO CP24: 60.0000 mg | ORAL_CAPSULE | Freq: Every day | ORAL | 1 refills | Status: DC

## 2022-05-23 NOTE — Telephone Encounter (Signed)
Refills sent to mail order pharmacy.

## 2022-05-24 ENCOUNTER — Encounter (HOSPITAL_BASED_OUTPATIENT_CLINIC_OR_DEPARTMENT_OTHER): Payer: Self-pay | Admitting: Physical Therapy

## 2022-05-24 ENCOUNTER — Ambulatory Visit (HOSPITAL_BASED_OUTPATIENT_CLINIC_OR_DEPARTMENT_OTHER): Payer: Commercial Managed Care - HMO | Attending: Physical Medicine & Rehabilitation | Admitting: Physical Therapy

## 2022-05-24 DIAGNOSIS — M6281 Muscle weakness (generalized): Secondary | ICD-10-CM | POA: Insufficient documentation

## 2022-05-24 DIAGNOSIS — M5459 Other low back pain: Secondary | ICD-10-CM | POA: Insufficient documentation

## 2022-05-24 DIAGNOSIS — M797 Fibromyalgia: Secondary | ICD-10-CM | POA: Diagnosis present

## 2022-05-24 NOTE — Therapy (Signed)
OUTPATIENT PHYSICAL THERAPY    Patient Name: Valerie Richards MRN: [id number] DOB:Dec 08, 1970, 51 y.o., female Today's Date: 05/24/2022   PCP: Fanny Bien, MD REFERRING PROVIDER: Alysia Penna MD   PT End of Session - 05/24/22 0904     Visit Number 2    Number of Visits 16    Date for PT Re-Evaluation 07/19/29    PT Start Time 0900    PT Stop Time 0945    PT Time Calculation (min) 45 min    Activity Tolerance Patient limited by pain;Patient tolerated treatment well    Behavior During Therapy [facility] for tasks assessed/performed            PT End of Session -     Visit Number 1   Number of Visits 16   Date for PT Re-Evaluation 05/24/2022   Authorization Type Friday   PT Start Time 0900   PT Stop Time 0945   PT Time Calculation (min) 45 min   Activity Tolerance Patient tolerated treatment well   Behavior During Therapy  [facility] for tasks assessed/performed     Past Medical History:  Diagnosis Date   Anemia    iron deficiency   Anxiety    Fibroid    Stroke ([place]) 12/2020   See MRI.   Past Surgical History:  Procedure Laterality Date   EYE SURGERY Bilateral    TONSILLECTOMY      ONSET DATE: 5/23  REFERRING DIAG: M79.7 (ICD-10-CM) - Fibromyalgia  THERAPY DIAG:  Fibromyalgia  Muscle weakness (generalized)  Other low back pain  Rationale for Evaluation and Treatment Rehabilitation  SUBJECTIVE:                                                                                                                                                                                             Current subjective "new insurance, covers the aquatics. Have been hurting bad in my back and right knee" SUBJECTIVE STATEMENT: Husband had ALS took care of him for 7 years until passing, pain throughout that time; right shoulder ,bilat shoulder blades and cervical spine pain, knees R>L, feet cramping daily.  "I am scared I am going to fall so I don't move much".  House chores are  completed with many rest periods because I get tired. Pt accompanied by: self  PERTINENT HISTORY: Myofascial pain syndrome, Cervical pain , migraines, CVA 2022, depression  PAIN:  Are you having pain? Yes: NPRS scale: Current 6-7/10 Max 10/10 least, 8/10 Pain location: R shoulder and cervical spine Pain description: Constant. jabbing, burning Aggravating factors: holding grandchild, combing hair, cleaning Relieving factors: OTC and narcotic  meds,rest,Heat Right knee current 7/10  max 10/10 LB current 7/10  max 10/10 PRECAUTIONS: None  WEIGHT BEARING RESTRICTIONS No  FALLS: Has patient fallen in last 6 months? Yes. Number of falls 1 Slipped down 5 steps  LIVING ENVIRONMENT: Lives with: lives with their family and lives alone Lives in: House/apartment Stairs: Yes: Internal: 16 steps; on right going up Has following equipment at home: None  PLOF: Independent  PATIENT GOALS Want to enjoy the rest of my life, go out, play with grand child  OBJECTIVE:   DIAGNOSTIC FINDINGS: MRI dated 06/29/2021 no significant compressive lesions in the central canal, only mild foraminal stenosis at C5-C6  COGNITION: Overall cognitive status: Within functional limits for tasks assessed   SENSATION: Parasthesia bilat hands and feet  COORDINATION: wfl  EDEMA:  none  MUSCLE TONE: WFL   MUSCLE LENGTH: wfl    POSTURE: rounded shoulders, build very slight  LOWER EXTREMITY ROM:    WFL  LOWER EXTREMITY MMT:    MMT Right Eval Left Eval Right/Left  Hip flexion 3+ 3+ 3+ - 3+  Hip extension     Hip abduction     Hip adduction     Hip internal rotation     Hip external rotation     Knee flexion 3+ 4- 3+ - 4-/5  Knee extension '4 4 4 '$ - 4/5  Ankle dorsiflexion '4 4 4 '$ - 4/5  Ankle plantarflexion Causes cramping Causes cramping   Ankle inversion     Ankle eversion     (Blank rows = not tested) UE ROM : WFL Strength bilat shoulders 3+/5 MMT limited throughout due to pain  Lumbar  spine ROM: forward flex limited 50% by pain  BED MOBILITY:  Completes indep but slow with c/o pain in back  TRANSFERS: Assistive device utilized: None  Sit to stand: Complete Independence Stand to sit: Complete Independence Chair to chair: Complete Independence Floor: CGA    GAIT: Gait pattern: WFL Distance walked: 500 Assistive device utilized: None Level of assistance: Complete Independence Comments: Cadence slowed, minimal arm swing, guarded posture  FUNCTIONAL TESTs:  5 times sit to stand: 32 s Timed up and go (TUG): 15s Berg Balance Scale: 28/56 BERG Balance Test          Date: 03/29/22  Sit to Stand 4  Standing unsupported 4  Sitting with back unsupported but feet supported 4  Stand to sit  3  Transfers  3  Standing unsupported with eyes closed 2  Standing unsupported feet together 1  From standing position, reach forward with outstretched arm 2  From standing position, pick up object from floor 0  From standing position, turn and look behind over each shoulder 1  Turn 360 1  Standing unsupported, alternately place foot on step 2  Standing unsupported, one foot in front 0  Standing on one leg 0  Total:  28    PATIENT SURVEYS:  FOTO 37 predicted outcome 45 visit #13  TODAY'S TREATMENT:  Pt seen for aquatic therapy today.  Treatment took place in water 3.25-4.5 ft in depth at the [facility]. Temp of water was 91.  Pt entered/exited the pool via steps with alternating pattern and  bilateral hand rail.  *Intro to setting *Walking forward, back and sidestep ue supported by yellow noodle x 4-6 widths each *Seated water bench: LAQ; hip flex; hip add/abd; x 10 STS ue supported on noodle x10 Cues for positioning   and execution *Standing UE supported on wall: df;,  pf; add/abd; marching; hip ext; Walking between exercises for recovery  Pt requires the buoyancy and hydrostatic pressure of water for support, and to offload joints by unweighting joint  load by at least 50 % in navel deep water and by at least 75-80% in chest to neck deep water.  Viscosity of the water is needed for resistance of strengthening. Water current perturbations provides challenge to standing balance requiring increased core activation.     PATIENT EDUCATION: Education details: anatomy of management of condition Person educated: Patient Education method: Explanation Education comprehension: verbalized understanding   HOME EXERCISE PROGRAM: To be assigned    GOALS: Goals reviewed with patient? Yes  SHORT TERM GOALS: Target date: 06/21/2022  Pt will tolerate 30+ minutes of aquatic therapy Baseline:Has not begun Goal status: INITIAL  2.  5 Times STS to improve to < 22s to demonstrate improving strength Baseline:32s Goal status: INITIAL  3.  Berg Balance score to improve to 38/56 or > to demonstrate improvement in balance and safety Baseline: 28/56 Goal status: INITIAL  4.  Pt will tolerate walking through grocery store without increased fatigue Baseline: unable to complete Goal status: INITIAL   LONG TERM GOALS: Target date: 07/19/2022  Foto to meet stated outcome Baseline: 3 Goal status: INITIAL  2.  Pt will improve in strength of LE 1 full grade or > to demonstrate improved and safe functional mobility. Baseline: hips 3+/5; knees 3- to 4/5 Goal status: INITIAL  3.  Pt will improve lumbar forward flex ROM to 90% to allow for reaching to floor for cleaning and playing with grand child purposes Baseline: 50% limited Goal status: INITIAL  4.  Pt will improve Berg balance score to 45/56 or greater to demonstrate decreased fall risk Baseline: 28/56 Goal status: INITIAL   ASSESSMENT:  CLINICAL IMPRESSION: Pt not seen since evalution due to insurance coverage.  Retested strength and ROM without any changes.  She is introduced to setting today requiring distance supervision by therapist on deck for safety in setting.  Initial apprehension  which decreased as session progressed.  She does report pain consistently throughout session in her LB and right knee but states it decreases some by end of session.  Used picture pain scale for better interpretation of her pain level due to english being 2nd language.  She tolerates session fairly well.  Requires a few seated rest periods.  She is apprehensive in the movement of her right knee but when distracted moves with improvement.  I will recert her for another 8 weeks as she is a good candidate for aquatic therapy and will benefit from the properties of water to facilitate and hasten her progression towards goals   Patient is a 51 y.o. Female who was seen today for physical therapy evaluation and treatment for Fibromyalgia. English is her second language but she communicates well. She has had progressive pain and fatigue going back 7+ years. Fibromyalgia new dx for which she is on new meds.  She reports some decrease in sx since starting them (2-3 weeks). With testing pt is weak throughout le's and core along with weakness and discomfort in right shoulder, upper back and cervical area. No significant findings on MRI.  She is moderately sensitive to palpation throughout bilat upper trap and posterior cervical area R>L.  She is of very slight build.  Pt will benefit from aquatic therapy using properties of water to progress towards goals. Will consider on land treatments/transition as strength improves and is deemed appropriate.  OBJECTIVE IMPAIRMENTS decreased activity tolerance, decreased balance, decreased endurance, decreased strength, and pain.   ACTIVITY LIMITATIONS carrying, lifting, bending, stairs, bed mobility, and reach over head  PARTICIPATION LIMITATIONS: meal prep, cleaning, laundry, shopping, and community activity  PERSONAL FACTORS Time since onset of injury/illness/exacerbation and 1-2 comorbidities: migraines, CVA, depression  are also affecting patient's functional outcome.    REHAB POTENTIAL: Good  CLINICAL DECISION MAKING: Evolving/moderate complexity  EVALUATION COMPLEXITY: Moderate  PLAN: PT FREQUENCY: 1-2x/week  PT DURATION: 8 Weeks  PLANNED INTERVENTIONS: Therapeutic exercises, Therapeutic activity, Neuromuscular re-education, Balance training, Gait training, Patient/Family education, Joint mobilization, Stair training, Aquatic Therapy, Dry Needling, Spinal mobilization, Cryotherapy, Moist heat, and Taping  PLAN FOR NEXT SESSION: begin aquatics; core and LE strengthening   Stanton Kidney (Frankie) [place] MPT 05/24/2022, 1:33 PM

## 2022-05-30 ENCOUNTER — Other Ambulatory Visit: Payer: Self-pay

## 2022-05-30 DIAGNOSIS — F419 Anxiety disorder, unspecified: Secondary | ICD-10-CM

## 2022-05-30 DIAGNOSIS — F323 Major depressive disorder, single episode, severe with psychotic features: Secondary | ICD-10-CM

## 2022-05-30 MED ORDER — ARIPIPRAZOLE 5 MG PO TABS: ORAL_TABLET | ORAL | 0 refills | Status: DC

## 2022-06-01 ENCOUNTER — Encounter (HOSPITAL_BASED_OUTPATIENT_CLINIC_OR_DEPARTMENT_OTHER): Payer: Self-pay | Admitting: Physical Therapy

## 2022-06-01 ENCOUNTER — Ambulatory Visit (HOSPITAL_BASED_OUTPATIENT_CLINIC_OR_DEPARTMENT_OTHER): Payer: Commercial Managed Care - HMO | Admitting: Physical Therapy

## 2022-06-01 DIAGNOSIS — M797 Fibromyalgia: Secondary | ICD-10-CM

## 2022-06-01 DIAGNOSIS — M5459 Other low back pain: Secondary | ICD-10-CM

## 2022-06-01 DIAGNOSIS — M6281 Muscle weakness (generalized): Secondary | ICD-10-CM

## 2022-06-01 NOTE — Therapy (Signed)
OUTPATIENT PHYSICAL THERAPY    Patient Name: Valerie Richards MRN: [id number] DOB:August 22, 1971, 51 y.o., female Today's Date: 06/01/2022   PCP: Fanny Bien, MD REFERRING PROVIDER: Alysia Penna MD   PT End of Session - 06/01/22 1040     Visit Number 3    Number of Visits 16    Date for PT Re-Evaluation 07/19/29    Authorization Type Friday    PT Start Time 1035    PT Stop Time 1115    PT Time Calculation (min) 40 min            PT End of Session -     Visit Number 1   Number of Visits 16   Date for PT Re-Evaluation 05/24/2022   Authorization Type Friday   PT Start Time 0900   PT Stop Time 0945   PT Time Calculation (min) 45 min   Activity Tolerance Patient tolerated treatment well   Behavior During Therapy  [facility] for tasks assessed/performed     Past Medical History:  Diagnosis Date   Anemia    iron deficiency   Anxiety    Fibroid    Stroke ([place]) 12/2020   See MRI.   Past Surgical History:  Procedure Laterality Date   EYE SURGERY Bilateral    TONSILLECTOMY      ONSET DATE: 5/23  REFERRING DIAG: M79.7 (ICD-10-CM) - Fibromyalgia  THERAPY DIAG:  Fibromyalgia  Muscle weakness (generalized)  Other low back pain  Rationale for Evaluation and Treatment Rehabilitation  SUBJECTIVE:                                                                                                                                                                                             Current subjective:"My back is ok today but my knee hurts bad 10/10 getting OOB this am."  Pt reports feeling good after last aquatic session, slight LB pain next day but resolved with movement. SUBJECTIVE STATEMENT: Husband had ALS took care of him for 7 years until passing, pain throughout that time; right shoulder ,bilat shoulder blades and cervical spine pain, knees R>L, feet cramping daily.  "I am scared I am going to fall so I don't move much".  House chores are completed with many  rest periods because I get tired. Pt accompanied by: self  PERTINENT HISTORY: Myofascial pain syndrome, Cervical pain , migraines, CVA 2022, depression  PAIN:  Are you having pain? Yes: NPRS scale: Current 6-7/10 Max 10/10 least, 8/10 Pain location: R shoulder and cervical spine Pain description: Constant. jabbing, burning Aggravating factors: holding grandchild, combing hair, cleaning Relieving factors: OTC and  narcotic meds,rest,Heat Right knee current 7/10  max 10/10 LB current 7/10  max 10/10 PRECAUTIONS: None  WEIGHT BEARING RESTRICTIONS No  FALLS: Has patient fallen in last 6 months? Yes. Number of falls 1 Slipped down 5 steps  LIVING ENVIRONMENT: Lives with: lives with their family and lives alone Lives in: House/apartment Stairs: Yes: Internal: 16 steps; on right going up Has following equipment at home: None  PLOF: Independent  PATIENT GOALS Want to enjoy the rest of my life, go out, play with grand child  OBJECTIVE:   DIAGNOSTIC FINDINGS: MRI dated 06/29/2021 no significant compressive lesions in the central canal, only mild foraminal stenosis at C5-C6  COGNITION: Overall cognitive status: Within functional limits for tasks assessed   SENSATION: Parasthesia bilat hands and feet  COORDINATION: wfl  EDEMA:  none  MUSCLE TONE: WFL   MUSCLE LENGTH: wfl    POSTURE: rounded shoulders, build very slight  LOWER EXTREMITY ROM:    WFL  LOWER EXTREMITY MMT:    MMT Right Eval Left Eval Right/Left  Hip flexion 3+ 3+ 3+ - 3+  Hip extension     Hip abduction     Hip adduction     Hip internal rotation     Hip external rotation     Knee flexion 3+ 4- 3+ - 4-/5  Knee extension '4 4 4 '$ - 4/5  Ankle dorsiflexion '4 4 4 '$ - 4/5  Ankle plantarflexion Causes cramping Causes cramping   Ankle inversion     Ankle eversion     (Blank rows = not tested) UE ROM : WFL Strength bilat shoulders 3+/5 MMT limited throughout due to pain  Lumbar spine ROM: forward  flex limited 50% by pain  BED MOBILITY:  Completes indep but slow with c/o pain in back  TRANSFERS: Assistive device utilized: None  Sit to stand: Complete Independence Stand to sit: Complete Independence Chair to chair: Complete Independence Floor: CGA    GAIT: Gait pattern: WFL Distance walked: 500 Assistive device utilized: None Level of assistance: Complete Independence Comments: Cadence slowed, minimal arm swing, guarded posture  FUNCTIONAL TESTs:  5 times sit to stand: 32 s Timed up and go (TUG): 15s Berg Balance Scale: 28/56 BERG Balance Test          Date: 03/29/22  Sit to Stand 4  Standing unsupported 4  Sitting with back unsupported but feet supported 4  Stand to sit  3  Transfers  3  Standing unsupported with eyes closed 2  Standing unsupported feet together 1  From standing position, reach forward with outstretched arm 2  From standing position, pick up object from floor 0  From standing position, turn and look behind over each shoulder 1  Turn 360 1  Standing unsupported, alternately place foot on step 2  Standing unsupported, one foot in front 0  Standing on one leg 0  Total:  28    PATIENT SURVEYS:  FOTO 37 predicted outcome 45 visit #13  TODAY'S TREATMENT:  Pt seen for aquatic therapy today.  Treatment took place in water 3.25-4.5 ft in depth at the [facility]. Temp of water was 91.  Pt entered/exited the pool via steps with alternating pattern and  bilateral hand rail.  *Walking forward, back and sidestep ue supported by yellow noodle in 4 ft. Added widths in 3 ft with arm swing.  *Seated water bench: LAQ; hip flex; hip add/abd; x 10 STS ue supported on noodle x10 Cues for positioning   and execution *  Standing UE supported noodle 4.16f: df; pf; add/abd; marching; hip ext; and flex x 10. Instruction on abd bracing and glut contraction for improved balance and control. *supported on wall hip circles R/L CW and CCW x 10 *Lumbar  stretching into flex 3 x 20s hold; lumbar rotation R/L x10 Seated 4th step: cycling (tolerate poorly right knee)  Walking between exercises for recovery  Pt requires the buoyancy and hydrostatic pressure of water for support, and to offload joints by unweighting joint load by at least 50 % in navel deep water and by at least 75-80% in chest to neck deep water.  Viscosity of the water is needed for resistance of strengthening. Water current perturbations provides challenge to standing balance requiring increased core activation.     PATIENT EDUCATION: Education details: anatomy of management of condition Person educated: Patient Education method: Explanation Education comprehension: verbalized understanding   HOME EXERCISE PROGRAM: To be assigned    GOALS: Goals reviewed with patient? Yes  SHORT TERM GOALS: Target date: 06/29/2022  Pt will tolerate 30+ minutes of aquatic therapy Baseline:Has not begun Goal status: INITIAL  2.  5 Times STS to improve to < 22s to demonstrate improving strength Baseline:32s Goal status: INITIAL  3.  Berg Balance score to improve to 38/56 or > to demonstrate improvement in balance and safety Baseline: 28/56 Goal status: INITIAL  4.  Pt will tolerate walking through grocery store without increased fatigue Baseline: unable to complete Goal status: INITIAL   LONG TERM GOALS: Target date: 07/27/2022  Foto to meet stated outcome Baseline: 3 Goal status: INITIAL  2.  Pt will improve in strength of LE 1 full grade or > to demonstrate improved and safe functional mobility. Baseline: hips 3+/5; knees 3- to 4/5 Goal status: INITIAL  3.  Pt will improve lumbar forward flex ROM to 90% to allow for reaching to floor for cleaning and playing with grand child purposes Baseline: 50% limited Goal status: INITIAL  4.  Pt will improve Berg balance score to 45/56 or greater to demonstrate decreased fall risk Baseline: 28/56 Goal status:  INITIAL   ASSESSMENT:  CLINICAL IMPRESSION: Pt limited by pain more in right knee than LB.  She reports having an MRI scheduled for sat after seeing ortho last week.  She declined an injection due to reporting the injection "messed me up" last time. Amb in and out of pool guarded with antalgic limp right. Cues for improved knee flex with heel strike/toe off submerged.  She progresses today quickly to indep in setting using yellow noodle for ue support only in deep end.  Directed through stretching and strengthening ex of LB and LE. She tolerates fairly well but with initial discomfort  R knee.  Decreases as session progress. Moderate vc for core stabilization with execution of exercises.    Patient is a 51y.o. Female who was seen today for physical therapy evaluation and treatment for Fibromyalgia. English is her second language but she communicates well. She has had progressive pain and fatigue going back 7+ years. Fibromyalgia new dx for which she is on new meds.  She reports some decrease in sx since starting them (2-3 weeks). With testing pt is weak throughout le's and core along with weakness and discomfort in right shoulder, upper back and cervical area. No significant findings on MRI.  She is moderately sensitive to palpation throughout bilat upper trap and posterior cervical area R>L.  She is of very slight build.  Pt will benefit from aquatic  therapy using properties of water to progress towards goals. Will consider on land treatments/transition as strength improves and is deemed appropriate.   OBJECTIVE IMPAIRMENTS decreased activity tolerance, decreased balance, decreased endurance, decreased strength, and pain.   ACTIVITY LIMITATIONS carrying, lifting, bending, stairs, bed mobility, and reach over head  PARTICIPATION LIMITATIONS: meal prep, cleaning, laundry, shopping, and community activity  PERSONAL FACTORS Time since onset of injury/illness/exacerbation and 1-2 comorbidities:  migraines, CVA, depression  are also affecting patient's functional outcome.   REHAB POTENTIAL: Good  CLINICAL DECISION MAKING: Evolving/moderate complexity  EVALUATION COMPLEXITY: Moderate  PLAN: PT FREQUENCY: 1-2x/week  PT DURATION: 8 Weeks  PLANNED INTERVENTIONS: Therapeutic exercises, Therapeutic activity, Neuromuscular re-education, Balance training, Gait training, Patient/Family education, Joint mobilization, Stair training, Aquatic Therapy, Dry Needling, Spinal mobilization, Cryotherapy, Moist heat, and Taping  PLAN FOR NEXT SESSION: begin aquatics; core and LE strengthening   Cassius Cullinane (Frankie) Dayshawn Irizarry MPT 06/01/2022, 11:17 AM

## 2022-06-02 ENCOUNTER — Ambulatory Visit (HOSPITAL_BASED_OUTPATIENT_CLINIC_OR_DEPARTMENT_OTHER): Payer: Commercial Managed Care - HMO | Admitting: Psychiatry

## 2022-06-02 ENCOUNTER — Encounter (HOSPITAL_COMMUNITY): Payer: Self-pay | Admitting: Psychiatry

## 2022-06-02 VITALS — BP 128/79 | HR 61 | Resp 18 | Ht 65.0 in | Wt 116.6 lb

## 2022-06-02 DIAGNOSIS — F4312 Post-traumatic stress disorder, chronic: Secondary | ICD-10-CM

## 2022-06-02 DIAGNOSIS — F323 Major depressive disorder, single episode, severe with psychotic features: Secondary | ICD-10-CM | POA: Diagnosis not present

## 2022-06-02 DIAGNOSIS — F410 Panic disorder [episodic paroxysmal anxiety] without agoraphobia: Secondary | ICD-10-CM

## 2022-06-02 MED ORDER — ARIPIPRAZOLE 5 MG PO TABS: 5.0000 mg | ORAL_TABLET | Freq: Every morning | ORAL | 1 refills | Status: DC

## 2022-06-02 MED ORDER — CLONAZEPAM 0.5 MG PO TABS: 0.5000 mg | ORAL_TABLET | ORAL | 1 refills | Status: DC

## 2022-06-02 MED ORDER — AMITRIPTYLINE HCL 75 MG PO TABS: 75.0000 mg | ORAL_TABLET | Freq: Every day | ORAL | 1 refills | Status: DC

## 2022-06-02 NOTE — Progress Notes (Signed)
BH MD/PA/NP OP Progress Note  06/02/2022 3:25 PM JOE TANNEY  MRN:  [id number]    Location: Patient: In Person Provider: Office    HPI: Patient came in for office.  She continues to struggle with some anxiety, night mares, flashback depression.  She has she brought her medication which she is taking.  She is not taking Zoloft, Ambien, medical, trazodone, Topamax and she is taking Abilify, amitriptyline, Nurtec and propranolol for headaches.  She is very disappointed because she had denied a letter for her disability.  Sometimes she feels hopeless and worthless because she do not know what else she can do.  She was very hopeful that disability will help financially but the decision was very disappointing.  Patient recalled she had psychiatry opinion in [place] and she recall asking questions which was not related to her illness.  She is not sleeping well.  She admitted lack of appetite, fatigue, extreme tiredness.  Her daughter went back to [place] as patient could not handle her and 6 grandkids.  She is not seeing any therapist but like to restart counseling.  Now she is thinking to get a lawyer and to reapply her disability.  Her daughter is trying to help her to find a Chief Executive Officer.  Patient has no tremors, shakes or any EPS.  She admitted sometime passive and fleeting suicidal thoughts a lot of rumination but denies any intent, plan.  She reported when she gets these thoughts she started crying and that helps.  She notes some time images of her deceased husband that makes her very scared and she usually leaves the room and go to another room by herself.  She denies any anger.  She is taking amitriptyline 50 mg and Abilify 5 mg daily.  Past Psychiatric History: History of depression, anxiety, childhood trauma and panic attack.  Saw provider few years ago at [facility] and prescribed Lexapro, Prozac, Effexor, mirtazapine but did not felt it helped.  Prescribed Zoloft, Ambien and hydroxyzine by  PCP.  She was recommended inpatient years ago but did not stay in the hospital.    Visit Diagnosis:    ICD-10-CM   1. Severe major depression with psychotic features ([place])  F32.3     2. Anxiety  F41.9     3. Chronic post-traumatic stress disorder (PTSD)  F43.12         Past Medical History:  Past Medical History:  Diagnosis Date   Anemia    iron deficiency   Anxiety    Fibroid    Stroke ([place]) 12/2020   See MRI.    Past Surgical History:  Procedure Laterality Date   EYE SURGERY Bilateral    TONSILLECTOMY      Family Psychiatric History: Reviewed  Family History:  Family History  Problem Relation Age of Onset   Hypertension Mother    Cancer Father    Diabetes Father    Colon cancer Father    Kidney failure Father    Diabetes Brother     Social History:  Social History   Socioeconomic History   Marital status: Widowed    Spouse name: Not on file   Number of children: Not on file   Years of education: Not on file   Highest education level: Not on file  Occupational History   Occupation: Scientist, water quality    Comment: Runner, broadcasting/film/video  Tobacco Use   Smoking status: Former    Packs/day: 0.25    Years: 8.00    Total pack  years: 2.00    Types: Cigarettes    Quit date: 10/10/2012    Years since quitting: 9.6   Smokeless tobacco: Never  Vaping Use   Vaping Use: Never used  Substance and Sexual Activity   Alcohol use: No    Alcohol/week: 0.0 standard drinks of alcohol   Drug use: No   Sexual activity: Not Currently    Birth control/protection: Post-menopausal  Other Topics Concern   Not on file  Social History Narrative   Widowed 2015-12-16. Husband died from Combes disease.    Right handed drinks caffeine   2 story home   Social Determinants of Health   Financial Resource Strain: Not on file  Food Insecurity: Not on file  Transportation Needs: Not on file  Physical Activity: Not on file  Stress: Not on file  Social Connections: Not on file     Allergies: No Known Allergies  Metabolic Disorder Labs: No results found for: "HGBA1C", "MPG" Lab Results  Component Value Date   PROLACTIN 17.4 11/17/2017   Lab Results  Component Value Date   CHOL 153 11/17/2017   TRIG 47 11/17/2017   HDL 77 11/17/2017   CHOLHDL 2.0 11/17/2017   LDLCALC 67 11/17/2017   Lab Results  Component Value Date   TSH 1.490 04/29/2019   TSH 2.600 11/17/2017    Therapeutic Level Labs: No results found for: "LITHIUM" No results found for: "VALPROATE" No results found for: "CBMZ"  Current Medications: Current Outpatient Medications  Medication Sig Dispense Refill   amitriptyline (ELAVIL) 25 MG tablet Take 1-2 tablets (25-50 mg total) by mouth at bedtime. 40 tablet 1   ARIPiprazole (ABILIFY) 5 MG tablet Take one tab daily 30 tablet 0   CALTRATE 600+D3 SOFT 600-20 MG-MCG CHEW Chew 1 tablet by mouth daily as needed. (Patient not taking: Reported on 04/11/2022)     cyclobenzaprine (FLEXERIL) 10 MG tablet TAKE 1 TABLET BY MOUTH EVERY 8 HOURS AS NEEDED FOR SPASM     diclofenac Sodium (VOLTAREN) 1 % GEL Voltaren 1 % topical gel  APPLY 2 GRAMS TO THE AFFECTED AREA(S) BY TOPICAL ROUTE 4 TIMES PER DAY (Patient not taking: Reported on 04/11/2022)     estradiol (ESTRACE VAGINAL) 0.1 MG/GM vaginal cream Place 1 Applicatorful vaginally 2 (two) times a week. Initial dose: nightly x 2 weeks, then decrease to every other night x 2 weeks, then twice weekly (Patient not taking: Reported on 12/28/2021) 42.5 g 12   famotidine (PEPCID) 20 MG tablet Take 1 tablet (20 mg total) by mouth 2 (two) times daily. (Patient not taking: Reported on 04/11/2022)     fluticasone (FLONASE) 50 MCG/ACT nasal spray Place 1 spray into both nostrils 2 (two) times daily. (Patient not taking: Reported on 04/11/2022)     LINZESS 290 MCG CAPS capsule Take 290 mcg by mouth every morning. (Patient not taking: Reported on 04/11/2022)     meloxicam (MOBIC) 15 MG tablet Take 1 tablet by mouth daily. (Patient  not taking: Reported on 04/11/2022)     NONFORMULARY OR COMPOUNDED ITEM Vaginal Vitamin E suppositories 200 U/ml insert one per vaginally twice weekly at bedtime. 8 each 2   ondansetron (ZOFRAN ODT) 8 MG disintegrating tablet Take 1 tablet (8 mg total) by mouth every 8 (eight) hours as needed for nausea or vomiting. (Patient not taking: Reported on 04/11/2022) 20 tablet 5   pantoprazole (PROTONIX) 40 MG tablet Take 1 tablet (40 mg total) by mouth 2 (two) times daily before a meal. 60  tablet 5   pregabalin (LYRICA) 50 MG capsule Take 1 capsule (50 mg total) by mouth 2 (two) times daily. (Patient not taking: Reported on 04/11/2022) 60 capsule 1   propranolol ER (INDERAL LA) 60 MG 24 hr capsule Take 1 capsule (60 mg total) by mouth daily. 30 capsule 1   Psyllium 0.36 g CAPS Take 1 capsule daily x7 days, then increase to BID (Patient not taking: Reported on 04/11/2022)     Rimegepant Sulfate (NURTEC) 75 MG TBDP Take 75 mg by mouth as needed (Take one tablet as needed at the earlist onset of a Migraine.  Max 1 tablet in 24 hours). 16 tablet 5   rizatriptan (MAXALT-MLT) 10 MG disintegrating tablet Take 1 tablet (10 mg total) by mouth as needed for migraine. May repeat in 2 hours if needed 9 tablet 1   sertraline (ZOLOFT) 100 MG tablet Take 1 tablet (100 mg total) by mouth every morning. (Patient not taking: Reported on 04/11/2022) 30 tablet 0   sucralfate (CARAFATE) 1 g tablet Dissolve 1 tablet in water to create a slurry and take QID prn (Patient not taking: Reported on 04/11/2022) 30 tablet 2   topiramate (TOPAMAX) 100 MG tablet Take 1 tablet (100 mg total) by mouth at bedtime. (Patient not taking: Reported on 04/11/2022) 30 tablet 5   traZODone (DESYREL) 150 MG tablet Take 1 tablet (150 mg total) by mouth at bedtime. (Patient not taking: Reported on 04/11/2022) 30 tablet 0   triamcinolone ointment (KENALOG) 0.1 % APPLY SMALL AMOUNT OF OINTMENT TOPICALLY TWICE DAILY FOR 14 DAYS THEN TWICE WEEKLY 30 g 1   No current  facility-administered medications for this visit.     Musculoskeletal: Strength & Muscle Tone: within normal limits Gait & Station: normal Patient leans: N/A  Psychiatric Specialty Exam: Review of Systems  Blood pressure 128/79, pulse 61, resp. rate 18, height '5\' 5"'$  (1.651 m), weight 116 lb 9.6 oz (52.9 kg), last menstrual period 02/26/2020, SpO2 100 %.Body mass index is 19.4 kg/m.  General Appearance: Casual  Eye Contact:  Fair  Speech:  Slow  Volume:  Decreased  Mood:  Anxious, Depressed, Dysphoric, and Hopeless  Affect:  Constricted and Depressed  Thought Process:  Goal Directed  Orientation:  Full (Time, Place, and Person)  Thought Content: Rumination   Suicidal Thoughts:   Positive including suicidal thoughts but no plan or intent.  Homicidal Thoughts:  No  Memory:  Immediate;   Fair Recent;   Fair Remote;   Fair  Judgement:  Fair  Insight:  Shallow  Psychomotor Activity:  Decreased  Concentration:  Concentration: Fair and Attention Span: Fair  Recall:  [employer] of Knowledge: Fair  Language: Fair  Akathisia:  No  Handed:  Right  AIMS (if indicated): not done  Assets:  Communication Skills Desire for Improvement Housing Transportation  ADL's:  Intact  Cognition: WNL  Sleep:  Poor   Screenings: PHQ2-9    [place] Office Visit from 03/03/2022 in [place] and Rehabilitation Counselor from 12/30/2021 in [place] Office Visit from 11/23/2015 in Primary Care at [place] from 05/26/2015 in Primary Care at [facility] Total Score '6 4 4 1  '$ PHQ-9 Total Score '23 22 9 '$ --      Flowsheet Row Counselor from 12/30/2021 in [place] Error: Q7 should not be populated when Q6 is No        Assessment and Plan:  Major depressive disorder, recurrent.   PTSD.  Panic attacks.  I reviewed her current medication.  Patient brought bottles with  her at today's visit.  She is not taking Topamax, Lyrica, Ambien, Zoloft and trazodone.  She also stopped taking blood pressure medication.  She is concerned not sleeping well.  She is having a lot of panic attacks.  She is very disappointed about her denial of disability.  Discussed trying amitriptyline 75 mg at bedtime, continue Abilify 5 mg daily and we will start low-dose Klonopin 0.5 mg in the morning because she feels very sad and anxious in the morning.  She like to go outside but she gets very nervous.  I encourage consider therapy to help her coping skills.Patient is trying to get attorney so she can appeal the decision of disability.  Discussed medication side effects and benefits.  We will refer to see a therapist in our office.  Follow-up in 6 weeks.  Collaboration of Care: Collaboration of Care: Other provider involved in patient's care AEB notes are available in epic to review  Patient/Guardian was advised Release of Information must be obtained prior to any record release in order to collaborate their care with an outside provider. Patient/Guardian was advised if they have not already done so to contact the registration department to sign all necessary forms in order for [place] to release information regarding their care.   Consent: Patient/Guardian gives verbal consent for treatment and assignment of benefits for services provided during this visit. Patient/Guardian expressed understanding and agreed to proceed.    Kathlee Nations, MD 06/02/2022, 3:25 PM

## 2022-06-06 ENCOUNTER — Ambulatory Visit (HOSPITAL_BASED_OUTPATIENT_CLINIC_OR_DEPARTMENT_OTHER): Payer: Commercial Managed Care - HMO | Admitting: Physical Therapy

## 2022-06-09 NOTE — Progress Notes (Signed)
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  Would not use Aimovig as she already has significant constipation.    Has tried topiramate, propranolol and amitriptyline.  Discontinue propranolol due to hypotension For trigeminal neuralgia, start oxcarbazepine '150mg'$  twice daily.  Check BMP. Migraine rescue:  Nurtec with Zofran ODT '8mg'$ .  Repeat MRI of C-spine to follow up on vertebral body lesion. Refer to spine specialist/interventional pain for evaluation and management of cervical radiculopathy. Keep headache diary Continue ASA '81mg'$  daily.  Follow up 4 months.     Subjective:  Khailee Mick is a 51 year old female with history of iron-deficiency anemia who follows up for migraine and cervical radiculopathy   UPDATE: Ordered repeat MRI of cervical spine to follow up on vertebral body lesion but she never was contacted to schedule appointment.  Started propranolol.  At first, headaches improved but then became almost daily.   Intensity:  moderate to severe Duration:  5-6 hours Frequency:  20-25 days a month (50% are severe) Reports increased depression Reports increased lightheadedness over the past month.  Due to multiple medications, will now just take the propranolol every now and then.  Over the past month, she reports paroxsymal shooting pain in the right V2-V3 distribution with right sided facial numbness.  Occurs every other day.    Current medications: Rescue:  Nurtec with Zofran ODT '8mg'$  Preventative:  On amitriptyline (prescribed by another provider)   HISTORY: Beginning around September 2021, she started having dizziness.  It is positional.  It is a spinning sensation lasting seconds to a  couple of minutes.  Associated with blurred vision and sometimes nausea.  Occurs every other day to twice a day.     Around the same time, she developed new onset headaches.  They are severe right frontal pounding headaches associated with nausea, photophobia and sometimes phonophobia.  They sometimes wake her up at night from sleep.  It lasts 2 hours with Tylenol.  They occur every other day.  No known triggers.  However she does have chronic neck pain radiating into the right shoulder.  X-ray cervical spine from 06/17/2020 personally reviewed showed degenerative straightening of the cervical lordosis with focally moderate disc space height loss and osteophytosis of C6-C7.    MRI brain with and without contrast on 12/31/2020 showed paranasal sinus disease and 3 mm chronic right cerebellar infarct but no acute abnormality.  Advised to start ASA but stopped because it caused bruising.    She reports aching pain on the left side of her neck and arm, sometimes leg.  Also left eye twitches.  Pain in the left knee and ankle.  Reports numbness and tingling in fingers and toes, particularly when she is feeling anxious. Did not respond to PT.  MRI of C-spine on 07/07/2021  showed cervical spondylosis with with minimal spinal stenosis at C3-4 and mild bilateral neural foraminal narrowing at C5-6.  5 mm lesion within C7 vertebral body noted, nonspecific but may reflect an atypical hemangioma.   Past medications:  topiramate.  PAST MEDICAL HISTORY: Past Medical History:  Diagnosis Date   Anemia    iron deficiency   Anxiety    Fibroid    Stroke ([place]) 12/2020   See MRI.    MEDICATIONS: Current Outpatient Medications  on File Prior to Visit  Medication Sig Dispense Refill   amitriptyline (ELAVIL) 75 MG tablet Take 1 tablet (75 mg total) by mouth at bedtime. 30 tablet 1   ARIPiprazole (ABILIFY) 5 MG tablet Take 1 tablet (5 mg total) by mouth every morning. 30 tablet 1   clonazePAM (KLONOPIN) 0.5 MG tablet Take  1 tablet (0.5 mg total) by mouth every morning. 30 tablet 1   diclofenac Sodium (VOLTAREN) 1 % GEL Voltaren 1 % topical gel  APPLY 2 GRAMS TO THE AFFECTED AREA(S) BY TOPICAL ROUTE 4 TIMES PER DAY (Patient not taking: Reported on 04/11/2022)     famotidine (PEPCID) 20 MG tablet Take 1 tablet (20 mg total) by mouth 2 (two) times daily. (Patient not taking: Reported on 04/11/2022)     propranolol ER (INDERAL LA) 60 MG 24 hr capsule Take 1 capsule (60 mg total) by mouth daily. 30 capsule 1   Rimegepant Sulfate (NURTEC) 75 MG TBDP Take 75 mg by mouth as needed (Take one tablet as needed at the earlist onset of a Migraine.  Max 1 tablet in 24 hours). 16 tablet 5   No current facility-administered medications on file prior to visit.    ALLERGIES: No Known Allergies  FAMILY HISTORY: Family History  Problem Relation Age of Onset   Hypertension Mother    Cancer Father    Diabetes Father    Colon cancer Father    Kidney failure Father    Diabetes Brother       Objective:  Blood pressure (!) 83/55, pulse 69, height '5\' 5"'$  (1.651 m), weight 114 lb (51.7 kg), last menstrual period 02/26/2020, SpO2 99 %. General: No acute distress.  Patient appears well-groomed.   Head:  Normocephalic/atraumatic Heart:  RRR Neuro:  alert and oriented to person, place, and time.  Speech fluent and not dysarthric, language intact.  CN II-XII intact. Bulk and tone normal, muscle strength 5/5 throughout.  Sensation to light touch intact.  Deep tendon reflexes 2+ throughout.  Finger to nose testing intact.  Gait normal, Romberg negative.   Metta Clines, DO  CC: Rachell Cipro, MD

## 2022-06-13 ENCOUNTER — Other Ambulatory Visit: Payer: Self-pay | Admitting: Neurology

## 2022-06-14 ENCOUNTER — Telehealth: Payer: Self-pay

## 2022-06-14 ENCOUNTER — Other Ambulatory Visit (INDEPENDENT_AMBULATORY_CARE_PROVIDER_SITE_OTHER): Payer: Commercial Managed Care - HMO

## 2022-06-14 ENCOUNTER — Ambulatory Visit: Payer: Commercial Managed Care - HMO | Admitting: Neurology

## 2022-06-14 ENCOUNTER — Encounter: Payer: Self-pay | Admitting: Neurology

## 2022-06-14 VITALS — BP 83/55 | HR 69 | Ht 65.0 in | Wt 114.0 lb

## 2022-06-14 DIAGNOSIS — G43009 Migraine without aura, not intractable, without status migrainosus: Secondary | ICD-10-CM

## 2022-06-14 DIAGNOSIS — R937 Abnormal findings on diagnostic imaging of other parts of musculoskeletal system: Secondary | ICD-10-CM

## 2022-06-14 DIAGNOSIS — G500 Trigeminal neuralgia: Secondary | ICD-10-CM

## 2022-06-14 LAB — BASIC METABOLIC PANEL
BUN: 16 mg/dL (ref 6–23)
CO2: 29 mEq/L (ref 19–32)
Calcium: 9.9 mg/dL (ref 8.4–10.5)
Chloride: 104 mEq/L (ref 96–112)
Creatinine, Ser: 0.69 mg/dL (ref 0.40–1.20)
GFR: 99.69 mL/min (ref >60.00)
Glucose, Bld: 86 mg/dL (ref 70–99)
Potassium: 4.1 mEq/L (ref 3.5–5.1)
Sodium: 140 mEq/L (ref 135–145)

## 2022-06-14 MED ORDER — OXCARBAZEPINE 150 MG PO TABS: 150.0000 mg | ORAL_TABLET | Freq: Two times a day (BID) | ORAL | 5 refills | Status: DC

## 2022-06-14 MED ORDER — EMGALITY 120 MG/ML ~~LOC~~ SOAJ: SUBCUTANEOUS | 0 refills | Status: DC

## 2022-06-14 NOTE — Telephone Encounter (Signed)
Patient seen today. Dr.Jaffe would like to start Emaglity.  PA team please start a PA.

## 2022-06-14 NOTE — Patient Instructions (Signed)
Will need to get prior approval.  Take the two injections now.  Then 1 injection every 28 days.  If the insurance does not approve it, we will look at an alternative medication. Stop propranolol Start oxcarbazepine '150mg'$  twice daily for face pain.  Check BMP today and again in 3 months Check MRI of cervical spine with and without contrast Follow up 4 months.

## 2022-06-15 ENCOUNTER — Ambulatory Visit (HOSPITAL_BASED_OUTPATIENT_CLINIC_OR_DEPARTMENT_OTHER): Payer: Commercial Managed Care - HMO | Admitting: Physical Therapy

## 2022-06-16 ENCOUNTER — Telehealth: Payer: Self-pay

## 2022-06-16 ENCOUNTER — Other Ambulatory Visit (HOSPITAL_COMMUNITY): Payer: Self-pay

## 2022-06-16 NOTE — Telephone Encounter (Signed)
BIV requested by provider following appt on 06/14/22.  Submitted a Prior Authorization request to [employer] for  [facility] 120/mL Auto-Injector  via CoverMyMeds. Will update once we receive a response.   Key: [id number]

## 2022-06-16 NOTE — Telephone Encounter (Signed)
Received notification from [place] regarding a prior authorization for  [facility] . Authorization has been APPROVED from 06/16/2022 to 12/13/2022.   Per test claim, copay for Loading dose (2 pens) as a 28-day supply is $50.00. Copay is also $50.00 for maintenance dose (1 pen per 28 days).  Patient does not appear to be locked-in to a specific pharmacy at this time.  Authorization # [id number]   Activated a copay card for pt that should bring monthly copay down to $0. This card expires at the end of every calendar year, the pt may need to go online at the beginning of the year to renew. ([email or web address]  Help desk Phone# [phone]  Please provide the following information to the pt's preferred pharmacy: RxBIN: [id number] [place]: [id number] RxGRP: [id number] ID#: [id number]

## 2022-06-16 NOTE — Telephone Encounter (Signed)
Noted, will document BIV process in separate encounter.

## 2022-06-17 NOTE — Telephone Encounter (Signed)
Patient advised. Mychart message sent with Copay card information.

## 2022-06-21 ENCOUNTER — Ambulatory Visit (HOSPITAL_BASED_OUTPATIENT_CLINIC_OR_DEPARTMENT_OTHER): Payer: Commercial Managed Care - HMO | Admitting: Physical Therapy

## 2022-06-29 ENCOUNTER — Ambulatory Visit (HOSPITAL_BASED_OUTPATIENT_CLINIC_OR_DEPARTMENT_OTHER): Payer: Commercial Managed Care - HMO | Attending: Physical Medicine & Rehabilitation | Admitting: Physical Therapy

## 2022-06-29 ENCOUNTER — Encounter (HOSPITAL_BASED_OUTPATIENT_CLINIC_OR_DEPARTMENT_OTHER): Payer: Self-pay | Admitting: Physical Therapy

## 2022-06-29 DIAGNOSIS — M6281 Muscle weakness (generalized): Secondary | ICD-10-CM

## 2022-06-29 DIAGNOSIS — M797 Fibromyalgia: Secondary | ICD-10-CM | POA: Diagnosis present

## 2022-06-29 DIAGNOSIS — M5459 Other low back pain: Secondary | ICD-10-CM | POA: Diagnosis present

## 2022-06-29 NOTE — Therapy (Addendum)
OUTPATIENT PHYSICAL THERAPY    Patient Name: Valerie Richards MRN: [id number] DOB:[birth year], 51 y.o., female Today's Date: 06/29/2022  PHYSICAL THERAPY DISCHARGE SUMMARY  Visits from Start of Care: 4  Current functional level related to goals / functional outcomes: unknown   Remaining deficits: all   Education / Equipment: Discussed eval findings, rehab rationale at [place]    Patient agrees to discharge. Patient goals were not met. Patient is being discharged due to not returning since the last visit.  PCP: Fanny Bien, MD REFERRING PROVIDER: Alysia Penna MD   PT End of Session - 06/29/22 1023     Visit Number 4    Number of Visits 16    Date for PT Re-Evaluation 07/19/29    Authorization Type Friday    PT Start Time 0950    PT Stop Time 1030    PT Time Calculation (min) 40 min             PT End of Session -     Visit Number 1   Number of Visits 16   Date for PT Re-Evaluation 05/24/2022   Authorization Type Friday   PT Start Time 0900   PT Stop Time 0945   PT Time Calculation (min) 45 min   Activity Tolerance Patient tolerated treatment well   Behavior During Therapy  [facility] for tasks assessed/performed     Past Medical History:  Diagnosis Date   Anemia    iron deficiency   Anxiety    Fibroid    Stroke ([place]) 12/2020   See MRI.   Past Surgical History:  Procedure Laterality Date   EYE SURGERY Bilateral    TONSILLECTOMY      ONSET DATE: 5/23  REFERRING DIAG: M79.7 (ICD-10-CM) - Fibromyalgia  THERAPY DIAG:  Fibromyalgia  Muscle weakness (generalized)  Other low back pain  Rationale for Evaluation and Treatment Rehabilitation  SUBJECTIVE:                                                                                                                                                                                             Current subjective:"I am better today but I have been sick. My right side back is really hurting today and my  foot keeps cramping" SUBJECTIVE STATEMENT: Husband had ALS took care of him for 7 years until passing, pain throughout that time; right shoulder ,bilat shoulder blades and cervical spine pain, knees R>L, feet cramping daily.  "I am scared I am going to fall so I don't move much".  House chores are completed with many rest periods because I get tired. Pt accompanied by:  self  PERTINENT HISTORY: Myofascial pain syndrome, Cervical pain , migraines, CVA 2022, depression  PAIN:  Are you having pain? Yes: NPRS scale: Current 6/10 Max 10/10 least, 8/10 Pain location: R shoulder and cervical spine Pain description: Constant. jabbing, burning Aggravating factors: holding grandchild, combing hair, cleaning Relieving factors: OTC and narcotic meds,rest,Heat Right knee current 8/10  max 10/10 LB current 7/10  max 10/10 PRECAUTIONS: None  WEIGHT BEARING RESTRICTIONS No  FALLS: Has patient fallen in last 6 months? Yes. Number of falls 1 Slipped down 5 steps  LIVING ENVIRONMENT: Lives with: lives with their family and lives alone Lives in: House/apartment Stairs: Yes: Internal: 16 steps; on right going up Has following equipment at home: None  PLOF: Independent  PATIENT GOALS Want to enjoy the rest of my life, go out, play with grand child  OBJECTIVE:   DIAGNOSTIC FINDINGS: MRI dated 06/29/2021 no significant compressive lesions in the central canal, only mild foraminal stenosis at C5-C6  COGNITION: Overall cognitive status: Within functional limits for tasks assessed   SENSATION: Parasthesia bilat hands and feet  COORDINATION: wfl  EDEMA:  none  MUSCLE TONE: WFL   MUSCLE LENGTH: wfl    POSTURE: rounded shoulders, build very slight  LOWER EXTREMITY ROM:    WFL  LOWER EXTREMITY MMT:    MMT Right Eval Left Eval Right/Left  Hip flexion 3+ 3+ 3+ - 3+  Hip extension     Hip abduction     Hip adduction     Hip internal rotation     Hip external rotation     Knee  flexion 3+ 4- 3+ - 4-/5  Knee extension _0 - 4/5  Ankle dorsiflexion _1 - 4/5  Ankle plantarflexion Causes cramping Causes cramping   Ankle inversion     Ankle eversion     (Blank rows = not tested) UE ROM : WFL Strength bilat shoulders 3+/5 MMT limited throughout due to pain  Lumbar spine ROM: forward flex limited 50% by pain  BED MOBILITY:  Completes indep but slow with c/o pain in back  TRANSFERS: Assistive device utilized: None  Sit to stand: Complete Independence Stand to sit: Complete Independence Chair to chair: Complete Independence Floor: CGA    GAIT: Gait pattern: WFL Distance walked: 500 Assistive device utilized: None Level of assistance: Complete Independence Comments: Cadence slowed, minimal arm swing, guarded posture  FUNCTIONAL TESTs:  5 times sit to stand: 32 s Timed up and go (TUG): 15s Berg Balance Scale: 28/56 BERG Balance Test          Date: 03/29/22  Sit to Stand 4  Standing unsupported 4  Sitting with back unsupported but feet supported 4  Stand to sit  3  Transfers  3  Standing unsupported with eyes closed 2  Standing unsupported feet together 1  From standing position, reach forward with outstretched arm 2  From standing position, pick up object from floor 0  From standing position, turn and look behind over each shoulder 1  Turn 360 1  Standing unsupported, alternately place foot on step 2  Standing unsupported, one foot in front 0  Standing on one leg 0  Total:  28    PATIENT SURVEYS:  FOTO 37 predicted outcome 45 visit #13  TODAY'S TREATMENT:  Pt seen for aquatic therapy today.  Treatment took place in water 3.25-4.5 ft in depth at the [place]. Temp of water was 91.  Pt entered/exited the pool via steps with  alternating pattern and  bilateral hand rail.  *Walking forward, back and side step ue supported by yellow noodle in 4 ft. Multiple widths *Lumbar stretching into flex 5 x 20s hold with hip hiking  last rep R/L x 2 *supported on wall hip circles R/L CW and CCW x 10 3.6 ft *Seated 4th step:  cycling (tolerate poorly right knee); flutter 2x20; add/abd 2x10 *Standing UE supported wall 4.79f: df; pf; add/abd; marching; hip ext; hip openers; hip flex x 10.  *High knee marching 4 widths. Not tolerated well rle  Walking between exercises for recovery  Pt requires the buoyancy and hydrostatic pressure of water for support, and to offload joints by unweighting joint load by at least 50 % in navel deep water and by at least 75-80% in chest to neck deep water.  Viscosity of the water is needed for resistance of strengthening. Water current perturbations provides challenge to standing balance requiring increased core activation.     PATIENT EDUCATION: Education details: anatomy of management of condition Person educated: Patient Education method: Explanation Education comprehension: verbalized understanding   HOME EXERCISE PROGRAM: To be assigned    GOALS: Goals reviewed with patient? Yes  SHORT TERM GOALS: Target date: 07/27/2022  Pt will tolerate 30+ minutes of aquatic therapy Baseline:Has not begun Goal status: INITIAL  2.  5 Times STS to improve to < 22s to demonstrate improving strength Baseline:32s Goal status: INITIAL  3.  Berg Balance score to improve to 38/56 or > to demonstrate improvement in balance and safety Baseline: 28/56 Goal status: INITIAL  4.  Pt will tolerate walking through grocery store without increased fatigue Baseline: unable to complete Goal status: INITIAL   LONG TERM GOALS: Target date: 08/24/2022  Foto to meet stated outcome Baseline: 3 Goal status: INITIAL  2.  Pt will improve in strength of LE 1 full grade or > to demonstrate improved and safe functional mobility. Baseline: hips 3+/5; knees 3- to 4/5 Goal status: INITIAL  3.  Pt will improve lumbar forward flex ROM to 90% to allow for reaching to floor for cleaning and playing with  grand child purposes Baseline: 50% limited Goal status: INITIAL  4.  Pt will improve Berg balance score to 45/56 or greater to demonstrate decreased fall risk Baseline: 28/56 Goal status: INITIAL   ASSESSMENT:  CLINICAL IMPRESSION: Pt has been sick missing a few appointments.  She reports she was unable to have MRI of lumbar spine due to cost.  She has been having increased r sided lb pain with increased right foot cramping. She responds very well to lumbar stretching initially. She does guard r knee with all activities. Focus today on joint mobilization to improve ease of movement and decrease pain sx.  She tolerates session limited by pain but well.    Patient is a 51y.o. Female who was seen today for physical therapy evaluation and treatment for Fibromyalgia. English is her second language but she communicates well. She has had progressive pain and fatigue going back 7+ years. Fibromyalgia new dx for which she is on new meds.  She reports some decrease in sx since starting them (2-3 weeks). With testing pt is weak throughout le's and core along with weakness and discomfort in right shoulder, upper back and cervical area. No significant findings on MRI.  She is moderately sensitive to palpation throughout bilat upper trap and posterior cervical area R>L.  She is of very slight build.  Pt will benefit from aquatic therapy using properties  of water to progress towards goals. Will consider on land treatments/transition as strength improves and is deemed appropriate.   OBJECTIVE IMPAIRMENTS decreased activity tolerance, decreased balance, decreased endurance, decreased strength, and pain.   ACTIVITY LIMITATIONS carrying, lifting, bending, stairs, bed mobility, and reach over head  PARTICIPATION LIMITATIONS: meal prep, cleaning, laundry, shopping, and community activity  PERSONAL FACTORS Time since onset of injury/illness/exacerbation and 1-2 comorbidities: migraines, CVA, depression  are also  affecting patient's functional outcome.   REHAB POTENTIAL: Good  CLINICAL DECISION MAKING: Evolving/moderate complexity  EVALUATION COMPLEXITY: Moderate  PLAN: PT FREQUENCY: 1-2x/week  PT DURATION: 8 Weeks  PLANNED INTERVENTIONS: Therapeutic exercises, Therapeutic activity, Neuromuscular re-education, Balance training, Gait training, Patient/Family education, Joint mobilization, Stair training, Aquatic Therapy, Dry Needling, Spinal mobilization, Cryotherapy, Moist heat, and Taping  PLAN FOR NEXT SESSION: begin aquatics; core and LE strengthening   Jarelle Ates (Frankie) Ellen Goris MPT 06/29/2022, 10:42 AM  Stanton Kidney Tharon Aquas) Naudia Crosley MPT Addended 08/31/22 413p

## 2022-06-30 ENCOUNTER — Ambulatory Visit
Admission: RE | Admit: 2022-06-30 | Discharge: 2022-06-30 | Disposition: A | Discharge status: Home / Self Care | Payer: Commercial Managed Care - HMO | Source: Ambulatory Visit | Attending: Neurology | Admitting: Neurology

## 2022-06-30 DIAGNOSIS — G43009 Migraine without aura, not intractable, without status migrainosus: Secondary | ICD-10-CM

## 2022-06-30 DIAGNOSIS — G500 Trigeminal neuralgia: Secondary | ICD-10-CM

## 2022-06-30 DIAGNOSIS — R937 Abnormal findings on diagnostic imaging of other parts of musculoskeletal system: Secondary | ICD-10-CM

## 2022-06-30 MED ORDER — GADOBUTROL 1 MMOL/ML IV SOLN
5.0000 mL | Freq: Once | INTRAVENOUS | Status: AC | PRN
  Administered 2022-06-30: 5 mL via INTRAVENOUS

## 2022-07-01 ENCOUNTER — Telehealth: Payer: Self-pay | Admitting: Neurology

## 2022-07-01 NOTE — Telephone Encounter (Signed)
Pt called in wanting to see if she can get her MRI results

## 2022-07-01 NOTE — Telephone Encounter (Signed)
Pt called an advised that we will call her back once we get the results,

## 2022-07-04 ENCOUNTER — Other Ambulatory Visit: Payer: Self-pay | Admitting: Neurology

## 2022-07-04 MED ORDER — TIZANIDINE HCL 4 MG PO TABS: ORAL_TABLET | ORAL | 5 refills | Status: DC

## 2022-07-05 ENCOUNTER — Ambulatory Visit (HOSPITAL_BASED_OUTPATIENT_CLINIC_OR_DEPARTMENT_OTHER): Payer: Self-pay | Admitting: Physical Therapy

## 2022-07-12 ENCOUNTER — Ambulatory Visit (HOSPITAL_BASED_OUTPATIENT_CLINIC_OR_DEPARTMENT_OTHER): Payer: Commercial Managed Care - HMO | Admitting: Physical Therapy

## 2022-07-18 ENCOUNTER — Telehealth (HOSPITAL_BASED_OUTPATIENT_CLINIC_OR_DEPARTMENT_OTHER): Payer: Commercial Managed Care - HMO | Admitting: Psychiatry

## 2022-07-18 ENCOUNTER — Encounter (HOSPITAL_COMMUNITY): Payer: Self-pay | Admitting: Psychiatry

## 2022-07-18 DIAGNOSIS — F410 Panic disorder [episodic paroxysmal anxiety] without agoraphobia: Secondary | ICD-10-CM | POA: Diagnosis not present

## 2022-07-18 DIAGNOSIS — F4312 Post-traumatic stress disorder, chronic: Secondary | ICD-10-CM

## 2022-07-18 DIAGNOSIS — F323 Major depressive disorder, single episode, severe with psychotic features: Secondary | ICD-10-CM | POA: Diagnosis not present

## 2022-07-18 MED ORDER — CLONAZEPAM 0.5 MG PO TABS: 0.5000 mg | ORAL_TABLET | ORAL | 1 refills | Status: DC

## 2022-07-18 MED ORDER — ARIPIPRAZOLE 5 MG PO TABS: 5.0000 mg | ORAL_TABLET | Freq: Every morning | ORAL | 1 refills | Status: DC

## 2022-07-18 MED ORDER — AMITRIPTYLINE HCL 100 MG PO TABS: 100.0000 mg | ORAL_TABLET | Freq: Every day | ORAL | 1 refills | Status: DC

## 2022-07-18 NOTE — Progress Notes (Signed)
Virtual Visit via Telephone Note  I connected with Valerie Richards on 07/18/22 at  3:00 PM EDT by telephone and verified that I am speaking with the correct person using two identifiers.  Location: Patient: Home Provider: Home Office   I discussed the limitations, risks, security and privacy concerns of performing an evaluation and management service by telephone and the availability of in person appointments. I also discussed with the patient that there may be a patient responsible charge related to this service. The patient expressed understanding and agreed to proceed.   History of Present Illness: Patient is evaluated by phone session.  On the last visit we increased amitriptyline and now she is taking 75 mg.  She noticed in the beginning sleep is improved but lately started to worry again after war started in [place].  Her Mother Lives in [place].  She Is Thinking Too Much about Them and Having Nightmares and Flashback.  She Also Reported Episodes of Low Blood Pressure and Her Physician Stopped the Propranolol.  She Continues to Have A Lot Of Anxiety and Nervousness When She Leaves the House.  There Are Times When She Has To Call Her Family Member to Pick up from the [employer] Because She Was Anxious and Started to Have Crying Spells.  She Did Talk to Her Attorney Who Recommend to Try One More Time by Herself to Appeal the Decision and If the Decision Again Does Not Go in Her [place]is Junes Then Oneta Rack Is Willing to Take the Case.  She Has No Tremors, Shakes or Any EPS.  She Denies Any Suicidal Thoughts but Continued to Endorse Severe Anxiety, Feeling of Hopelessness or Worthlessness.  Her Biggest Concern Is Financial Stress As She Is Not Working and Does Not Have Any Income to Support Herself.  She Is Compliant with Abilify, Klonopin and Amitriptyline.  She Is Eating Better and Her Weight Did Not Decrease since the Last Visit.  She Has No Tremor or Shakes.  She is still waiting for therapy as has  not received any phone call from the places.  Past Psychiatric History: History of depression, anxiety, childhood trauma and panic attack.  Saw provider few years ago at [facility] and prescribed Lexapro, Prozac, Effexor, mirtazapine but did not felt it helped.  Prescribed Zoloft, Ambien and hydroxyzine by PCP.  She was recommended inpatient years ago but did not stay in the hospital.   Psychiatric Specialty Exam: Physical Exam  Review of Systems  Weight 115 lb (52.2 kg), last menstrual period 02/26/2020.There is no height or weight on file to calculate BMI.  General Appearance: NA  Eye Contact:  NA  Speech:  Slow  Volume:  Decreased  Mood:  Anxious, Depressed, and Dysphoric  Affect:  NA  Thought Process:  Goal Directed  Orientation:  Full (Time, Place, and Person)  Thought Content:  Rumination  Suicidal Thoughts:  No  Homicidal Thoughts:  No  Memory:  Immediate;   Fair Recent;   Fair Remote;   Fair  Judgement:  Fair  Insight:  Shallow  Psychomotor Activity:  NA  Concentration:  Concentration: Fair and Attention Span: Fair  Recall:  [employer] of Knowledge:  Fair  Language:  Fair  Akathisia:  No  Handed:  Right  AIMS (if indicated):     Assets:  Communication Skills Desire for Improvement Housing  ADL's:  Intact  Cognition:  WNL  Sleep:   better but still nigh mares      Assessment and Plan: Major  depressive disorder, recurrent.  PTSD.  Panic attacks.  Patient symptoms increased due to recent war in [place].  She was doing better with amitriptyline.  Recommend to increase amitriptyline 100 mg at bedtime to help her insomnia, nightmares.  I do believe patient needed therapy and we will again refer her to see therapist and we will provide more names and places.  Continue Abilify 5 mg daily, Klonopin 0.5 mg in the morning to take as needed for panic attack.  Recommend to call us back if she has any question or any concern.  Follow-up in 2 months.  Follow Up  Instructions:    I discussed the assessment and treatment plan with the patient. The patient was provided an opportunity to ask questions and all were answered. The patient agreed with the plan and demonstrated an understanding of the instructions.   The patient was advised to call back or seek an in-person evaluation if the symptoms worsen or if the condition fails to improve as anticipated.  Collaboration of Care: Other provider involved in patient's care AEB notes are available in epic to review.  Patient/Guardian was advised Release of Information must be obtained prior to any record release in order to collaborate their care with an outside provider. Patient/Guardian was advised if they have not already done so to contact the registration department to sign all necessary forms in order for [place] to release information regarding their care.   Consent: Patient/Guardian gives verbal consent for treatment and assignment of benefits for services provided during this visit. Patient/Guardian expressed understanding and agreed to proceed.    I provided 16 minutes of non-face-to-face time during this encounter.   Kathlee Nations, MD

## 2022-07-19 ENCOUNTER — Ambulatory Visit (HOSPITAL_BASED_OUTPATIENT_CLINIC_OR_DEPARTMENT_OTHER): Payer: Self-pay | Admitting: Physical Therapy

## 2022-07-26 ENCOUNTER — Ambulatory Visit (HOSPITAL_BASED_OUTPATIENT_CLINIC_OR_DEPARTMENT_OTHER): Payer: Self-pay | Admitting: Physical Therapy

## 2022-08-01 ENCOUNTER — Other Ambulatory Visit: Payer: Self-pay | Admitting: Family Medicine

## 2022-08-01 DIAGNOSIS — R928 Other abnormal and inconclusive findings on diagnostic imaging of breast: Secondary | ICD-10-CM

## 2022-08-09 ENCOUNTER — Ambulatory Visit
Admission: RE | Admit: 2022-08-09 | Discharge: 2022-08-09 | Disposition: A | Discharge status: Home / Self Care | Payer: Commercial Managed Care - HMO | Source: Ambulatory Visit | Attending: Family Medicine | Admitting: Family Medicine

## 2022-08-09 DIAGNOSIS — R928 Other abnormal and inconclusive findings on diagnostic imaging of breast: Secondary | ICD-10-CM

## 2022-08-24 ENCOUNTER — Other Ambulatory Visit (HOSPITAL_COMMUNITY): Payer: Self-pay | Admitting: Psychiatry

## 2022-08-24 DIAGNOSIS — F323 Major depressive disorder, single episode, severe with psychotic features: Secondary | ICD-10-CM

## 2022-09-16 ENCOUNTER — Encounter (HOSPITAL_COMMUNITY): Payer: Self-pay | Admitting: Psychiatry

## 2022-09-16 ENCOUNTER — Telehealth (HOSPITAL_BASED_OUTPATIENT_CLINIC_OR_DEPARTMENT_OTHER): Payer: Commercial Managed Care - HMO | Admitting: Psychiatry

## 2022-09-16 ENCOUNTER — Other Ambulatory Visit: Payer: Self-pay

## 2022-09-16 DIAGNOSIS — F4312 Post-traumatic stress disorder, chronic: Secondary | ICD-10-CM

## 2022-09-16 DIAGNOSIS — F323 Major depressive disorder, single episode, severe with psychotic features: Secondary | ICD-10-CM

## 2022-09-16 DIAGNOSIS — F410 Panic disorder [episodic paroxysmal anxiety] without agoraphobia: Secondary | ICD-10-CM | POA: Diagnosis not present

## 2022-09-16 MED ORDER — AMITRIPTYLINE HCL 100 MG PO TABS: 100.0000 mg | ORAL_TABLET | Freq: Every day | ORAL | 2 refills | Status: DC

## 2022-09-16 MED ORDER — ARIPIPRAZOLE 5 MG PO TABS: 5.0000 mg | ORAL_TABLET | Freq: Every morning | ORAL | 2 refills | Status: DC

## 2022-09-16 MED ORDER — TIZANIDINE HCL 4 MG PO TABS: ORAL_TABLET | ORAL | 5 refills | Status: DC

## 2022-09-16 MED ORDER — CLONAZEPAM 0.5 MG PO TABS: 0.5000 mg | ORAL_TABLET | Freq: Two times a day (BID) | ORAL | 2 refills | Status: DC

## 2022-09-16 NOTE — Progress Notes (Signed)
Virtual Visit via Video Note  I connected with Valerie Richards on 09/16/22 at 10:00 AM EST by a video enabled telemedicine application and verified that I am speaking with the correct person using two identifiers.  Location: Patient: Home Provider: Home Office   I discussed the limitations of evaluation and management by telemedicine and the availability of in person appointments. The patient expressed understanding and agreed to proceed.  History of Present Illness: Patient is evaluated by video session.  She reported for anxiety depression has been worse because she is very concerned about her mother and relatives who lives in [place].  She has been watching news and admitted having crying spells when seeing the dead bodies.  Her sleep has been worse and she is having nightmares and flashback.  She also having a lot of panic attacks.  She does not go outside unless it is important and even when she go outside she sometimes call her family members to pick her up from the grocery stores because she is crying.  She reported having racing thoughts, sometimes passive and fleeting suicidal thoughts but no plan of any intent.  She is taking Klonopin that helps her panic attack but evenings and nights are difficult.  She had appealed her disability decision and waiting to hear from them.  Patient told someone called from disability that they are looking at her papers.  She is not sure when the decision will come.  She struggle with her financial needs.  Her daughter works at a lab for but she does not have enough money to give her.  Sometimes her kids help for groceries.  Her appetite is fair.  Her weight is unchanged from the past.  She denies any hallucination, anger but reported isolated, withdrawn and no motivation and energy to do things.  She is consistent with Abilify, Klonopin and amitriptyline.  She noticed marginal increase in her sleep with increase amitriptyline but lately watching the news does  not help.  She started therapy and so far she has 3 sessions but has not seen any improvement.   Past Psychiatric History: History of depression, anxiety, childhood trauma and panic attack.  Saw provider few years ago at [facility] and prescribed Lexapro, Prozac, Effexor, mirtazapine but did not felt it helped.  Prescribed Zoloft, Ambien and hydroxyzine by PCP.  She was recommended inpatient years ago but did not stay in the hospital.   Psychiatric Specialty Exam: Physical Exam  Review of Systems  Weight 115 lb (52.2 kg), last menstrual period 02/26/2020.There is no height or weight on file to calculate BMI.  General Appearance: Fairly Groomed  Eye Contact:  Good  Speech:  Slow  Volume:  Decreased  Mood:  Anxious, Depressed, Dysphoric, and Hopeless  Affect:  Constricted and Depressed  Thought Process:  Descriptions of Associations: Intact  Orientation:  Full (Time, Place, and Person)  Thought Content:  Rumination  Suicidal Thoughts:   passive thoughts but no plan  Homicidal Thoughts:  No  Memory:  Immediate;   Good Recent;   Good Remote;   Good  Judgement:  Intact  Insight:  Present  Psychomotor Activity:  Decreased  Concentration:  Concentration: Fair and Attention Span: Fair  Recall:  [employer] of Knowledge:  Fair  Language:  Good  Akathisia:  No  Handed:  Right  AIMS (if indicated):     Assets:  Communication Skills Desire for Improvement Housing  ADL's:  Intact  Cognition:  WNL  Sleep:  fair, still dreams      Assessment and Plan: Major depressive disorder, recurrent.  PTSD.  Panic attacks.  Patient reported symptoms started to get worse after war in [place] intense and she is having a lot of nightmares and flashback.  I encouraged try not to watch news but patient is still get information from her relatives.  Likely her mother and immediate family members are alive and they live will far from [place].  I encouraged to continue therapy and try to walk and  distract herself.  We discussed safety concern that anytime having active suicidal thoughts or homicidal thought then she need to call 911 or go to local emergency room.  Talk about medication and I emphasized take the medicine on time.  Recommend try taking Klonopin 0.5 mg rather 1 a day, take 2 times a day to help her anxiety and panic attack.  She agreed to give a try.  Continue amitriptyline 100 mg at bedtime.  We will follow-up in 3 months.  Recommend to use her resources when she is in crisis.  Follow Up Instructions:    I discussed the assessment and treatment plan with the patient. The patient was provided an opportunity to ask questions and all were answered. The patient agreed with the plan and demonstrated an understanding of the instructions.   The patient was advised to call back or seek an in-person evaluation if the symptoms worsen or if the condition fails to improve as anticipated.  Collaboration of Care: Other provider involved in patient's care AEB notes are available in epic to review.  Patient/Guardian was advised Release of Information must be obtained prior to any record release in order to collaborate their care with an outside provider. Patient/Guardian was advised if they have not already done so to contact the registration department to sign all necessary forms in order for [place] to release information regarding their care.   Consent: Patient/Guardian gives verbal consent for treatment and assignment of benefits for services provided during this visit. Patient/Guardian expressed understanding and agreed to proceed.    I provided 28 minutes of non-face-to-face time during this encounter.   Kathlee Nations, MD

## 2022-09-16 NOTE — Telephone Encounter (Signed)
Patient would like her medication to be sent to [facility].

## 2022-09-20 ENCOUNTER — Telehealth: Payer: Self-pay

## 2022-09-20 NOTE — Telephone Encounter (Signed)
Message from [facility], tizanidine has a contra cation with Propranolol, and Pepcid.

## 2022-09-20 NOTE — Telephone Encounter (Signed)
Per [facility] sent for a 90 day supply of Tizandine

## 2022-09-21 NOTE — Telephone Encounter (Signed)
Per Dr.Jaffe,As discussed, I still approve prescription for tizanidine.  In regards specifically to propranolol, we discontinued propranolol at last visit.   Staff Pharmacist Sejal P. Advised of note.

## 2022-10-17 ENCOUNTER — Ambulatory Visit: Payer: 59 | Admitting: Obstetrics and Gynecology

## 2022-11-07 NOTE — Progress Notes (Deleted)
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  Would not use Aimovig as she already has significant constipation.    Has tried topiramate, propranolol and amitriptyline.  Discontinue propranolol due to hypotension For trigeminal neuralgia, start oxcarbazepine '150mg'$  twice daily.  Check BMP. Migraine rescue:  Nurtec with Zofran ODT '8mg'$ .  Repeat MRI of C-spine to follow up on vertebral body lesion. Refer to spine specialist/interventional pain for evaluation and management of cervical radiculopathy. Keep headache diary Continue ASA '81mg'$  daily.  Follow up 4 months.     Subjective:  Valerie Richards is a 52 year old female with history of iron-deficiency anemia who follows up for migraine and cervical radiculopathy   UPDATE: Started Emgality. 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  Referred to spine specialist. ***   Current medications: Rescue:  Nurtec with Zofran ODT '8mg'$  Antidepressant:  On amitriptyline (prescribed by another provider) Antiepileptic:  oxcarbazepine '150mg'$  BID CGRP inhibitor:  Emgality   HISTORY: Beginning around September 2021, she started having dizziness.  It is positional.  It is a spinning sensation lasting seconds to a couple of minutes.  Associated with blurred vision and sometimes nausea.  Occurs every other day to twice a day.     Around the same time, she developed new onset headaches.  They are severe right  frontal pounding headaches associated with nausea, photophobia and sometimes phonophobia.  They sometimes wake her up at night from sleep.  It lasts 2 hours with Tylenol.  They occur every other day.  No known triggers.  However she does have chronic neck pain radiating into the right shoulder.  X-ray cervical spine from 06/17/2020 personally reviewed showed degenerative straightening of the cervical lordosis with focally moderate disc space height loss and osteophytosis of C6-C7.    MRI brain with and without contrast on 12/31/2020 showed paranasal sinus disease and 3 mm chronic right cerebellar infarct but no acute abnormality.  Advised to start ASA but stopped because it caused bruising.    She reports aching pain on the left side of her neck and arm, sometimes leg.  Also left eye twitches.  Pain in the left knee and ankle.  Reports numbness and tingling in fingers and toes, particularly when she is feeling anxious. Did not respond to PT.  MRI of C-spine on 07/07/2021  showed cervical spondylosis with with minimal spinal stenosis at C3-4 and mild bilateral neural foraminal narrowing at C5-6.  5 mm lesion within C7 vertebral body noted, nonspecific but may reflect an atypical hemangioma.   Since 2023, she reports paroxsymal shooting pain in the right V2-V3 distribution with right sided facial numbness.  Occurs every other day.    Past medications:  topiramate, propranolol (hypotension).  PAST MEDICAL HISTORY: Past Medical History:  Diagnosis Date   Anemia    iron deficiency   Anxiety    Fibroid    Stroke ([place]) 12/2020   See MRI.    MEDICATIONS: Current Outpatient Medications on File Prior to Visit  Medication Sig Dispense Refill   amitriptyline (  ELAVIL) 100 MG tablet Take 1 tablet (100 mg total) by mouth at bedtime. 30 tablet 2   ARIPiprazole (ABILIFY) 5 MG tablet Take 1 tablet (5 mg total) by mouth every morning. 30 tablet 2   clonazePAM (KLONOPIN) 0.5 MG tablet Take 1 tablet (0.5 mg total)  by mouth 2 (two) times daily. 60 tablet 2   diclofenac Sodium (VOLTAREN) 1 % GEL      Galcanezumab-gnlm (EMGALITY) 120 MG/ML SOAJ Medication Samples have been provided to the patient.  Drug name: Emgality       Strength: '120mg'$ /ml        Qty: 1  LOTSI:[id number]  Exp.Date: 12/05/23  Dosing instructions:  The patient has been instructed regarding the correct time, dose, and frequency of taking this medication, including desired effects and most common side effects.   Valerie Richards 10:08 AM 06/14/2022 1.12 mL 0   OXcarbazepine (TRILEPTAL) 150 MG tablet Take 1 tablet (150 mg total) by mouth 2 (two) times daily. 60 tablet 5   Rimegepant Sulfate (NURTEC) 75 MG TBDP Take 75 mg by mouth as needed (Take one tablet as needed at the earlist onset of a Migraine.  Max 1 tablet in 24 hours). 16 tablet 5   tiZANidine (ZANAFLEX) 4 MG tablet Take 0.5-1 tablet at bedtime. 30 tablet 5   No current facility-administered medications on file prior to visit.    ALLERGIES: No Known Allergies  FAMILY HISTORY: Family History  Problem Relation Age of Onset   Hypertension Mother    Cancer Father    Diabetes Father    Colon cancer Father    Kidney failure Father    Diabetes Brother       Objective:  ***. General: No acute distress.  Patient appears well-groomed.   Head:  Normocephalic/atraumatic Heart:  RRR Neuro: ***   Metta Clines, DO  CC: Rachell Cipro, MD

## 2022-11-08 ENCOUNTER — Ambulatory Visit: Payer: 59 | Admitting: Neurology

## 2022-11-14 ENCOUNTER — Encounter: Payer: Self-pay | Admitting: Family

## 2022-11-14 ENCOUNTER — Ambulatory Visit (INDEPENDENT_AMBULATORY_CARE_PROVIDER_SITE_OTHER): Payer: BLUE CROSS/BLUE SHIELD | Admitting: Family

## 2022-11-14 VITALS — BP 91/60 | HR 71 | Temp 97.5°F | Ht 65.0 in | Wt 124.0 lb

## 2022-11-14 DIAGNOSIS — M25542 Pain in joints of left hand: Secondary | ICD-10-CM

## 2022-11-14 DIAGNOSIS — M25541 Pain in joints of right hand: Secondary | ICD-10-CM | POA: Diagnosis not present

## 2022-11-14 DIAGNOSIS — R42 Dizziness and giddiness: Secondary | ICD-10-CM | POA: Diagnosis not present

## 2022-11-14 DIAGNOSIS — K5909 Other constipation: Secondary | ICD-10-CM | POA: Diagnosis not present

## 2022-11-14 MED ORDER — LINZESS 290 MCG PO CAPS: 290.0000 ug | ORAL_CAPSULE | Freq: Every morning | ORAL | 5 refills | Status: DC

## 2022-11-14 NOTE — Patient Instructions (Signed)
Welcome to [facility], It was a pleasure meeting you today!    As discussed, I have sent your refill of your Linzess to your pharmacy. Be sure to increase your intake of water to at least 8 cups daily, ok to add a flavored mix in for taste.   Please schedule a physical with fasting labs today.    PLEASE NOTE: If you had any LAB tests please let us know if you have not heard back within a few days. You may see your results on MyChart before we have a chance to review them but we will give you a call once they are reviewed by [place]. If we ordered any REFERRALS today, please let us know if you have not heard from their office within the next week.  Let us know through MyChart if you are needing REFILLS, or have your pharmacy send [place] the request. You can also use MyChart to communicate with me or any office staff.  Please try these tips to maintain a healthy lifestyle: It is important that you exercise regularly at least 30 minutes 5 times a week. Think about what you will eat, plan ahead. Choose whole foods, & think  "clean, green, fresh or frozen" over canned, processed or packaged foods which are more sugary, salty, and fatty. 70 to 75% of food eaten should be fresh vegetables and protein. 2-3  meals daily with healthy snacks between meals, but must be whole fruit, protein or vegetables. Aim to eat over a 10 hour period when you are active, for example, 7am to 5pm, and then STOP after your last meal of the day, drinking only water.  Shorter eating windows, 6-8 hours, are showing benefits in heart disease and blood sugar regulation. Drink water every day! Shoot for 64 ounces daily = 8 cups, no other drink is as healthy! Fruit juice is best enjoyed in a healthy way, by EATING the fruit.

## 2022-11-14 NOTE — Assessment & Plan Note (Addendum)
   chronic  today also having some mild swelling in both hands and dark erythema in bilateral fingers.   denies having the erythema in past, no visible rash, no blanching  advised to return for fasting labs

## 2022-11-14 NOTE — Progress Notes (Signed)
New Patient Office Visit  Subjective:  Patient ID: Valerie Richards, female    DOB: [birth year]  Age: 52 y.o. MRN: [id number]  CC:  Chief Complaint  Patient presents with   New Patient (Initial Visit)   Constipation    Pt c/o constipation and bloating with all foods , Has tried linzess which does help. Would like a refill.    Edema    Pt c/o bilateral hand and leg swelling for a weeks. Has not tried elevating legs.    Dizziness    Pt c/o dizziness and fatigue  for about 3 months off and on but lately more often. Has tried laying down and getting rest but does not help.     HPI Valerie Richards presents for establishing care today.  Constipation:  chronic hx of constipation, mostly medication induced, had been taking Linzess, but ran out about 2 weeks ago.   Hand redness: bilateral hand edema and erythema, pt reports having arthritis in her hands and having her toes on both feet will curl over start cramping and she rubs them and they eventually resolve, states it happens almost every day for the last few weeks, had not happened in past. Hands will turn more red if using warm water, washing,etc. Denies burning pain, no rash.  Dizziness/fatigue:  pt reports having a lot of anxiety and difficulty sleeping even with taking several medications for these symptoms as well as for depression. Pt states she does not drink water, just coffee and tea, pt does not exercise.    Assessment & Plan:   Problem List Items Addressed This Visit       Digestive   Chronic constipation - Primary    chronic pt on many meds  hx of taking Linzess 270mg qd, but ran out 2 weeks ago, pt has had to switch from previous PCP d/t insurance refilling med today f/u 6 mos      Relevant Medications   LINZESS 290 MCG CAPS capsule     Other   Arthralgia of both hands    chronic today also having some mild swelling in both hands and dark erythema in bilateral fingers.  denies having the erythema in past, no  visible rash, no blanching advised to return for fasting labs       Other Visit Diagnoses     Dizziness    - w/fatigue, happening for months, pt on many different meds that can contribute, no lab work in last year or 2, BMP a few months ago wnl. Advised pt to return for physical & fasting labs. Advised to be sure to hydrated with 2L water qd, do not skip meals. Discussed benefits of exercise, start slowly, gradually building.    Subjective:    Outpatient Medications Prior to Visit  Medication Sig Dispense Refill   amitriptyline (ELAVIL) 100 MG tablet Take 1 tablet (100 mg total) by mouth at bedtime. 30 tablet 2   ARIPiprazole (ABILIFY) 5 MG tablet Take 1 tablet (5 mg total) by mouth every morning. 30 tablet 2   clonazePAM (KLONOPIN) 0.5 MG tablet Take 1 tablet (0.5 mg total) by mouth 2 (two) times daily. 60 tablet 2   diclofenac Sodium (VOLTAREN) 1 % GEL      Galcanezumab-gnlm (EMGALITY) 120 MG/ML SOAJ Medication Samples have been provided to the patient.  Drug name: Emgality       Strength: '120mg'$ /ml        Qty: 1  LOT:: [id number]  Exp.Date: 12/05/23  Dosing  instructions:  The patient has been instructed regarding the correct time, dose, and frequency of taking this medication, including desired effects and most common side effects.   Mahina A Allen 10:08 AM 06/14/2022 1.12 mL 0   OXcarbazepine (TRILEPTAL) 150 MG tablet Take 1 tablet (150 mg total) by mouth 2 (two) times daily. 60 tablet 5   Rimegepant Sulfate (NURTEC) 75 MG TBDP Take 75 mg by mouth as needed (Take one tablet as needed at the earlist onset of a Migraine.  Max 1 tablet in 24 hours). 16 tablet 5   tiZANidine (ZANAFLEX) 4 MG tablet Take 0.5-1 tablet at bedtime. 30 tablet 5   LINZESS 290 MCG CAPS capsule Take 290 mcg by mouth every morning. (Patient not taking: Reported on 11/14/2022)     No facility-administered medications prior to visit.   Past Medical History:  Diagnosis Date   Acute recurrent ethmoidal sinusitis  09/03/2018   Anemia    iron deficiency   Anxiety    Fibroid    Menorrhagia 01/20/2021   Nasal pain 09/03/2018   Stroke ([place]) 12/2020   See MRI.   Vaginal discharge 01/20/2021   Vaginitis and vulvovaginitis 01/20/2021   Past Surgical History:  Procedure Laterality Date   EYE SURGERY Bilateral    TONSILLECTOMY      Objective:   Today's Vitals: BP 91/60 (BP Location: Left Arm, Patient Position: Sitting, Cuff Size: Large)   Pulse 71   Temp (!) 97.5 F (36.4 C) (Temporal)   Ht '5\' 5"'$  (1.651 m)   Wt 124 lb (56.2 kg)   LMP 02/26/2020 (Exact Date)   SpO2 99%   BMI 20.63 kg/m   Physical Exam Vitals and nursing note reviewed.  Constitutional:      Appearance: Normal appearance.  Cardiovascular:     Rate and Rhythm: Normal rate and regular rhythm.  Pulmonary:     Effort: Pulmonary effort is normal.     Breath sounds: Normal breath sounds.  Musculoskeletal:        General: Normal range of motion.       Hands:  Skin:    General: Skin is warm and dry.     Findings: Erythema (dark red, bilateral fingers bilaterally from 1/2 of hand distally) present.  Neurological:     Mental Status: She is alert.  Psychiatric:        Mood and Affect: Mood normal.        Behavior: Behavior normal.     Meds ordered this encounter  Medications   LINZESS 290 MCG CAPS capsule    Sig: Take 1 capsule (290 mcg total) by mouth every morning.    Dispense:  30 capsule    Refill:  5    Order Specific Question:   Supervising Provider    Answer:   ANDY, CAMILLE L [[id number]]    Jeanie Sewer, NP

## 2022-11-14 NOTE — Assessment & Plan Note (Signed)
   chronic  pt on many meds   hx of taking Linzess 244mg qd, but ran out 2 weeks ago, pt has had to switch from previous PCP d/t insurance  refilling med today  f/u 6 mos

## 2022-11-15 ENCOUNTER — Ambulatory Visit (INDEPENDENT_AMBULATORY_CARE_PROVIDER_SITE_OTHER): Payer: BLUE CROSS/BLUE SHIELD | Admitting: Nurse Practitioner

## 2022-11-15 ENCOUNTER — Encounter: Payer: Self-pay | Admitting: Nurse Practitioner

## 2022-11-15 VITALS — BP 98/60 | HR 68

## 2022-11-15 DIAGNOSIS — N816 Rectocele: Secondary | ICD-10-CM | POA: Diagnosis not present

## 2022-11-15 DIAGNOSIS — N952 Postmenopausal atrophic vaginitis: Secondary | ICD-10-CM

## 2022-11-15 MED ORDER — ESTRADIOL 0.1 MG/GM VA CREA: 1.0000 g | TOPICAL_CREAM | VAGINAL | 2 refills | Status: DC

## 2022-11-15 NOTE — Progress Notes (Signed)
   Acute Office Visit  Subjective:    Patient ID: Valerie Richards, female    DOB: 10/17/1970, 52 y.o.   MRN: [id number]   HPI 52 y.o. presents today for vaginal dryness. Discussed vaginal estrogen December 2022 and confirmed with neurologist that OK to use but she never started. She reports not seeing mychart message. Would like to start now. Also complains of feeling bulge between her legs on days she stands or walks a lot. Has not felt this in a week or so. Seeing GI for chronic constipation, on Linzess. Will have BM and then throughout the day will have to wipe often due to small amount of stool leakage. Has discussed with GI.    Review of Systems  Constitutional: Negative.   Gastrointestinal:  Positive for constipation.       Fecal incontinence  Genitourinary:  Negative for difficulty urinating.       Vaginal dryness, vaginal bulge       Objective:    Physical Exam Constitutional:      Appearance: Normal appearance.  Genitourinary:    General: Normal vulva.     Vagina: Normal.     Cervix: Normal.     Uterus: Normal.      Adnexa: Right adnexa normal and left adnexa normal.     Comments: Mild rectocele Atrophic changes    BP 98/60   Pulse 68   LMP 02/26/2020 (Exact Date)   SpO2 99%  Wt Readings from Last 3 Encounters:  11/14/22 124 lb (56.2 kg)  06/14/22 114 lb (51.7 kg)  03/03/22 118 lb (53.5 kg)        Patient informed chaperone available to be present for breast and/or pelvic exam. Patient has requested no chaperone to be present. Patient has been advised what will be completed during breast and pelvic exam.   Assessment & Plan:   Problem List Items Addressed This Visit   None Visit Diagnoses     Vaginal atrophy    -  Primary   Relevant Medications   estradiol (ESTRACE VAGINAL) 0.1 MG/GM vaginal cream (Start on 11/17/2022)   Rectocele          Plan: Will start vaginal estrogen 1 gram nightly x 2 weeks, then decrease to twice weekly. Coconut oil for  lubrication. Mild rectocele could be causing bulge she feels that comes and goes. Likely from chronic constipation. Recommend discussing fecal incontinence with GI. Pelvic floor PT also recommended but she does not think her insurance will cover.      Tamela Gammon DNP, 4:22 PM 11/15/2022

## 2022-11-23 ENCOUNTER — Ambulatory Visit (INDEPENDENT_AMBULATORY_CARE_PROVIDER_SITE_OTHER): Payer: BLUE CROSS/BLUE SHIELD | Admitting: Family

## 2022-11-23 ENCOUNTER — Encounter: Payer: Self-pay | Admitting: Family

## 2022-11-23 VITALS — BP 102/66 | HR 59 | Temp 98.2°F | Ht 65.0 in | Wt 120.8 lb

## 2022-11-23 DIAGNOSIS — K2100 Gastro-esophageal reflux disease with esophagitis, without bleeding: Secondary | ICD-10-CM | POA: Diagnosis not present

## 2022-11-23 DIAGNOSIS — M25542 Pain in joints of left hand: Secondary | ICD-10-CM

## 2022-11-23 DIAGNOSIS — M25541 Pain in joints of right hand: Secondary | ICD-10-CM | POA: Diagnosis not present

## 2022-11-23 DIAGNOSIS — Z0001 Encounter for general adult medical examination with abnormal findings: Secondary | ICD-10-CM

## 2022-11-23 DIAGNOSIS — Z8639 Personal history of other endocrine, nutritional and metabolic disease: Secondary | ICD-10-CM

## 2022-11-23 DIAGNOSIS — E559 Vitamin D deficiency, unspecified: Secondary | ICD-10-CM

## 2022-11-23 LAB — COMPREHENSIVE METABOLIC PANEL
ALT: 12 U/L (ref 0–35)
AST: 15 U/L (ref 0–37)
Albumin: 4.5 g/dL (ref 3.5–5.2)
Alkaline Phosphatase: 60 U/L (ref 39–117)
BUN: 20 mg/dL (ref 6–23)
CO2: 28 mEq/L (ref 19–32)
Calcium: 10.2 mg/dL (ref 8.4–10.5)
Chloride: 104 mEq/L (ref 96–112)
Creatinine, Ser: 0.57 mg/dL (ref 0.40–1.20)
GFR: 104.07 mL/min (ref >60.00)
Glucose, Bld: 84 mg/dL (ref 70–99)
Potassium: 3.8 mEq/L (ref 3.5–5.1)
Sodium: 141 mEq/L (ref 135–145)
Total Bilirubin: 0.9 mg/dL (ref 0.2–1.2)
Total Protein: 6.8 g/dL (ref 6.0–8.3)

## 2022-11-23 LAB — CBC WITH DIFFERENTIAL/PLATELET
Basophils Absolute: 0.1 10*3/uL (ref 0.0–0.1)
Basophils Relative: 1.3 % (ref 0.0–3.0)
Eosinophils Absolute: 0.1 10*3/uL (ref 0.0–0.7)
Eosinophils Relative: 1.2 % (ref 0.0–5.0)
HCT: 37.7 % (ref 36.0–46.0)
Hemoglobin: 12.6 g/dL (ref 12.0–15.0)
Lymphocytes Relative: 36.3 % (ref 12.0–46.0)
Lymphs Abs: 1.7 10*3/uL (ref 0.7–4.0)
MCHC: 33.6 g/dL (ref 30.0–36.0)
MCV: 92.8 fl (ref 78.0–100.0)
Monocytes Absolute: 0.3 10*3/uL (ref 0.1–1.0)
Monocytes Relative: 5.6 % (ref 3.0–12.0)
Neutro Abs: 2.5 10*3/uL (ref 1.4–7.7)
Neutrophils Relative %: 55.6 % (ref 43.0–77.0)
Platelets: 218 10*3/uL (ref 150.0–400.0)
RBC: 4.06 Mil/uL (ref 3.87–5.11)
RDW: 13.5 % (ref 11.5–15.5)
WBC: 4.6 10*3/uL (ref 4.0–10.5)

## 2022-11-23 LAB — LIPID PANEL
Cholesterol: 189 mg/dL (ref 0–200)
HDL: 80.4 mg/dL (ref >39.00)
LDL Cholesterol: 96 mg/dL (ref 0–99)
NonHDL: 108.42
Total CHOL/HDL Ratio: 2
Triglycerides: 64 mg/dL (ref 0.0–149.0)
VLDL: 12.8 mg/dL (ref 0.0–40.0)

## 2022-11-23 LAB — VITAMIN D 25 HYDROXY (VIT D DEFICIENCY, FRACTURES): VITD: 25.24 ng/mL — ABNORMAL LOW (ref 30.00–100.00)

## 2022-11-23 LAB — TSH: TSH: 2.15 u[IU]/mL (ref 0.35–5.50)

## 2022-11-23 MED ORDER — OMEPRAZOLE 20 MG PO CPDR: 20.0000 mg | DELAYED_RELEASE_CAPSULE | Freq: Every day | ORAL | 0 refills | Status: DC

## 2022-11-23 NOTE — Progress Notes (Addendum)
Phone [phone]  Subjective:   Patient is a 52 y.o. female presenting for annual physical.    Chief Complaint  Patient presents with   Annual Exam    Fasting w/ labs   HPI: Acid reflux:  pt has had for years, reports taking a daily medicine in past, but can't remember the name but this helped her sx, reports heartburn, sore throat, belching, dysphagia. Denies epigastric pain or nausea.  See problem oriented charting- ROS- full  review of systems was completed and negative except for what is noted in HPI above.   The following were reviewed and entered/updated in epic: Past Medical History:  Diagnosis Date   Acute recurrent ethmoidal sinusitis 09/03/2018   Anemia    iron deficiency   Anxiety    Fibroid    Menorrhagia 01/20/2021   Nasal pain 09/03/2018   Stroke ([place]) 12/2020   See MRI.   Vaginal discharge 01/20/2021   Vaginitis and vulvovaginitis 01/20/2021   Patient Active Problem List   Diagnosis Date Noted   Chronic constipation 11/14/2022   Severe episode of recurrent major depressive disorder ([place]) 09/26/2019   Nasal congestion 09/03/2018   Nonintractable headache 09/03/2018   Arthralgia of both hands 02/28/2018   Uterine leiomyoma 11/30/2017   Symptomatic anemia 08/02/2016   Past Surgical History:  Procedure Laterality Date   EYE SURGERY Bilateral    TONSILLECTOMY      Family History  Problem Relation Age of Onset   Hypertension Mother    Cancer Father    Diabetes Father    Colon cancer Father    Kidney failure Father    Diabetes Brother     Medications- reviewed and updated Current Outpatient Medications  Medication Sig Dispense Refill   amitriptyline (ELAVIL) 100 MG tablet Take 1 tablet (100 mg total) by mouth at bedtime. 30 tablet 2   ARIPiprazole (ABILIFY) 5 MG tablet Take 1 tablet (5 mg total) by mouth every morning. 30 tablet 2   clonazePAM (KLONOPIN) 0.5 MG tablet Take 1 tablet (0.5 mg total) by mouth 2 (two) times daily. 60 tablet 2    diclofenac Sodium (VOLTAREN) 1 % GEL      estradiol (ESTRACE VAGINAL) 0.1 MG/GM vaginal cream Place 1 g vaginally 2 (two) times a week. Initial dose: Nightly x 2 weeks, then twice weekly 42.5 g 2   LINZESS 290 MCG CAPS capsule Take 1 capsule (290 mcg total) by mouth every morning. 30 capsule 5   omeprazole (PRILOSEC) 20 MG capsule Take 1 capsule (20 mg total) by mouth daily. START with 1 capsule bid for 2 weeks, then reduce to qd. 45 capsule 0   OXcarbazepine (TRILEPTAL) 150 MG tablet Take 1 tablet (150 mg total) by mouth 2 (two) times daily. 60 tablet 5   Rimegepant Sulfate (NURTEC) 75 MG TBDP Take 75 mg by mouth as needed (Take one tablet as needed at the earlist onset of a Migraine.  Max 1 tablet in 24 hours). 16 tablet 5   tiZANidine (ZANAFLEX) 4 MG tablet Take 0.5-1 tablet at bedtime. 30 tablet 5   No current facility-administered medications for this visit.    Allergies-reviewed and updated No Known Allergies  Social History   Social History Narrative   Widowed 11-28-15. Husband died from [place] disease.    Right handed drinks caffeine   2 story home    Objective:  BP 102/66 (BP Location: Left Arm, Patient Position: Sitting, Cuff Size: Large)   Pulse (!) 59   Temp  98.2 F (36.8 C) (Temporal)   Ht 5' 5"$  (1.651 m)   Wt 120 lb 12.8 oz (54.8 kg)   LMP 02/26/2020 (Exact Date)   SpO2 100%   BMI 20.10 kg/m  Physical Exam Vitals and nursing note reviewed.  Constitutional:      Appearance: Normal appearance.  HENT:     Head: Normocephalic.     Right Ear: Tympanic membrane normal.     Left Ear: Tympanic membrane normal.     Nose: Nose normal.     Mouth/Throat:     Mouth: Mucous membranes are moist.  Eyes:     Pupils: Pupils are equal, round, and reactive to light.  Cardiovascular:     Rate and Rhythm: Normal rate and regular rhythm.  Pulmonary:     Effort: Pulmonary effort is normal.     Breath sounds: Normal breath sounds.  Musculoskeletal:        General:  Normal range of motion.     Cervical back: Normal range of motion.  Lymphadenopathy:     Cervical: No cervical adenopathy.  Skin:    General: Skin is warm and dry.  Neurological:     Mental Status: She is alert.  Psychiatric:        Mood and Affect: Mood normal.        Behavior: Behavior normal.       Assessment and Plan   Health Maintenance counseling: 1. Anticipatory guidance: Patient counseled regarding regular dental exams q6 months, eye exams,  avoiding smoking and second hand smoke, limiting alcohol to 1 beverage per day, no illicit drugs.   2. Risk factor reduction:  Advised patient of need for regular exercise and diet rich with fruits and vegetables to reduce risk of heart attack and stroke. Exercise- walking.  Wt Readings from Last 3 Encounters:  11/23/22 120 lb 12.8 oz (54.8 kg)  11/14/22 124 lb (56.2 kg)  06/14/22 114 lb (51.7 kg)   3. Immunizations/screenings/ancillary studies  There is no immunization history on file for this patient. Health Maintenance Due  Topic Date Due   COVID-19 Vaccine (1) Never done   DTaP/Tdap/Td (1 - Tdap) Never done   Zoster Vaccines- Shingrix (1 of 2) Never done   PAP SMEAR-Modifier  11/17/2020    4. Cervical cancer screening- followed by GYN, will schedule this year 5. Breast cancer screening-  mammogram - 2023 6. Colon cancer screening - 2023, normal 7. Skin cancer screening- advised regular sunscreen use. Denies worrisome, changing, or new skin lesions.  8. Birth control/STD check- N/A, widowed, not sexually active 9. Osteoporosis screening- N/A 10. Alcohol screening: none 11. Smoking associated screening (lung cancer screening, AAA screen 65-75, UA)- non- smoker  Problem List Items Addressed This Visit       Other   Arthralgia of both hands   Relevant Orders   ANA, IFA Comprehensive Panel   Autoimmune Profile   Rheumatoid Factor   Other Visit Diagnoses     Gastroesophageal reflux disease with esophagitis without  hemorrhage    -  Primary   Relevant Medications   omeprazole (PRILOSEC) 20 MG capsule   Encounter for general adult medical examination with abnormal findings       Relevant Orders   Comprehensive metabolic panel   TSH   Lipid panel   CBC with Differential/Platelet   History of vitamin D deficiency       Relevant Orders   Vitamin D (25 hydroxy)       Recommended follow up:  Return in about 6 months (around 05/24/2023) for med refills. Future Appointments  Date Time Provider [place]  12/08/2022  1:30 PM Tamela Gammon, NP [facility] None  12/16/2022 10:40 AM Arfeen, Arlyce Harman, MD [facility] None  01/04/2023  9:30 AM Pieter Partridge, DO [facility] None    Lab/Order associations:fasting   Jeanie Sewer, NP

## 2022-11-23 NOTE — Addendum Note (Signed)
Addended byJeanie Sewer on: 11/23/2022 12:08 PM   Modules accepted: Orders

## 2022-11-23 NOTE — Patient Instructions (Signed)
It was very nice to see you today!   I will review your lab results via MyChart in a few days.       PLEASE NOTE:  If you had any lab tests please let us know if you have not heard back within a few days. You may see your results on MyChart before we have a chance to review them but we will give you a call once they are reviewed by [place]. If we ordered any referrals today, please let us know if you have not heard from their office within the next week.

## 2022-11-24 ENCOUNTER — Telehealth: Payer: Self-pay | Admitting: Family

## 2022-11-24 MED ORDER — VITAMIN D3 125 MCG (5000 UT) PO CAPS: 5000.0000 [IU] | ORAL_CAPSULE | Freq: Every day | ORAL | 1 refills | Status: DC

## 2022-11-24 NOTE — Telephone Encounter (Signed)
Patient states: - She saw vitamin d was low in lab results from 11/23/22 - She understands pcp needs time to look at it but she is concerned about the result  Patient requests: -PCP send in something to pharmacy to help with vitamin d levels. States she would like to pick up all of meds today

## 2022-11-24 NOTE — Addendum Note (Signed)
Addended byJeanie Sewer on: 11/24/2022 10:59 PM   Modules accepted: Orders

## 2022-11-24 NOTE — Progress Notes (Signed)
Your Glucose (sugar), electrolytes, kidney and liver function, blood count, thyroid, and cholesterol numbers are all good.   Just your Vitamin D level is low and I have sent over a daily supplement for you to take. Keep up the good work with controlling your diet and continue to try and shoot for 30 minutes of exercise daily!

## 2022-11-24 NOTE — Telephone Encounter (Signed)
Medication sent in. Lab results reviewed.

## 2022-11-25 LAB — RHEUMATOID FACTOR: Rheumatoid fact SerPl-aCnc: <14 IU/mL (ref <14)

## 2022-11-25 LAB — ANA, IFA COMPREHENSIVE PANEL
Anti Nuclear Antibody (ANA): NEGATIVE
ENA SM Ab Ser-aCnc: <1 AI
SM/RNP: <1 AI
SSA (Ro) (ENA) Antibody, IgG: <1 AI
SSB (La) (ENA) Antibody, IgG: <1 AI
Scleroderma (Scl-70) (ENA) Antibody, IgG: <1 AI
ds DNA Ab: 1 IU/mL

## 2022-11-26 LAB — AUTOIMMUNE PROFILE
Anti Nuclear Antibody (ANA): NEGATIVE
Complement C3, Serum: 101 mg/dL (ref 82–167)
dsDNA Ab: 1 IU/mL (ref 0–9)

## 2022-11-28 ENCOUNTER — Encounter: Payer: Self-pay | Admitting: *Deleted

## 2022-12-05 NOTE — Progress Notes (Signed)
Hi Valerie Richards,  Just to follow up, all of your autoimmune testing came up negative for rheumatoid arthritis, Lupus, Sjogrens and other connective tissue diseases.

## 2022-12-08 ENCOUNTER — Encounter: Payer: Self-pay | Admitting: Nurse Practitioner

## 2022-12-08 ENCOUNTER — Other Ambulatory Visit (HOSPITAL_COMMUNITY)
Admission: RE | Admit: 2022-12-08 | Discharge: 2022-12-08 | Disposition: A | Discharge status: Home / Self Care | Payer: Medicaid Other | Source: Ambulatory Visit | Attending: Nurse Practitioner | Admitting: Nurse Practitioner

## 2022-12-08 ENCOUNTER — Ambulatory Visit (INDEPENDENT_AMBULATORY_CARE_PROVIDER_SITE_OTHER): Payer: BLUE CROSS/BLUE SHIELD | Admitting: Nurse Practitioner

## 2022-12-08 VITALS — BP 100/72 | Ht 65.0 in | Wt 127.0 lb

## 2022-12-08 DIAGNOSIS — Z01419 Encounter for gynecological examination (general) (routine) without abnormal findings: Secondary | ICD-10-CM

## 2022-12-08 DIAGNOSIS — N952 Postmenopausal atrophic vaginitis: Secondary | ICD-10-CM | POA: Diagnosis not present

## 2022-12-08 DIAGNOSIS — Z124 Encounter for screening for malignant neoplasm of cervix: Secondary | ICD-10-CM

## 2022-12-08 NOTE — Progress Notes (Signed)
   Valerie Richards 11-28-1970 [id number]   History:  52 y.o. [id number] presents for annual exam. Postmenopausal. Just started vaginal estrogen for atrophy/dryness a couple of weeks ago. Has noticed some improvement. It was recommended last year and approved for use by neurologist but she never started..  H/O stroke. Normal pap history.   Gynecologic History Patient's last menstrual period was 02/26/2020 (exact date).   Contraception/Family planning: post menopausal status Sexually active: No  Health Maintenance Last Pap: 11/17/2017. Results were: Normal neg HPV Last mammogram (6 month diagnostic): 08/09/2022. Results were: 2 small probable benign masses, diag mamm with right ultrasound recommended in 1 year Last colonoscopy: 01/20/2021. Results were: Polyps, 5-year recall Last Dexa: Not indicated  Past medical history, past surgical history, family history and social history were all reviewed and documented in the EPIC chart. Widowed. Unemployed due to anxiety and depression. 5 children, 3 here and 2 in her home country.   ROS:  A ROS was performed and pertinent positives and negatives are included.  Exam:  Vitals:   12/08/22 1331  BP: 100/72  Weight: 127 lb (57.6 kg)  Height: 5' 5"$  (1.651 m)   Body mass index is 21.13 kg/m.  General appearance:  Normal Thyroid:  Symmetrical, normal in size, without palpable masses or nodularity. Respiratory  Auscultation:  Clear without wheezing or rhonchi Cardiovascular  Auscultation:  Regular rate, without rubs, murmurs or gallops  Edema/varicosities:  Not grossly evident Abdominal  Soft,nontender, without masses, guarding or rebound.  Liver/spleen:  No organomegaly noted  Hernia:  None appreciated  Skin  Inspection:  Grossly normal Breasts: Examined lying and sitting.   Right: Without masses, retractions, nipple discharge or axillary adenopathy.   Left: Without masses, retractions, nipple discharge or axillary adenopathy. Genitourinary    Inguinal/mons:  Normal without inguinal adenopathy  External genitalia:  Normal appearing vulva with no masses, tenderness, or lesions  BUS/Urethra/Skene's glands:  Normal  Vagina:  Normal appearing with normal color and discharge, no lesions. Atrophic changes  Cervix:  Normal appearing without discharge or lesions  Uterus:  Normal in size, shape and contour.  Midline and mobile, nontender  Adnexa/parametria:     Rt: Normal in size, without masses or tenderness.   Lt: Normal in size, without masses or tenderness.  Anus and perineum: Normal  Digital rectal exam: Deferred  Patient informed chaperone available to be present for breast and pelvic exam. Patient has requested no chaperone to be present. Patient has been advised what will be completed during breast and pelvic exam.   Assessment/Plan:  52 y.o. [id number] for annual exam.   Well female exam with routine gynecological exam - Education provided on SBEs, importance of preventative screenings, current guidelines, high calcium diet, regular exercise, and multivitamin daily. Labs with PCP.   Screening for cervical cancer - Plan: Cytology - PAP( [facility]). Normal pap history.   Vaginal atrophy - just started vaginal estrogen, using externally. Has noticed some improvement. Does not need refills at this time.   Screening for breast cancer - Normal mammogram history.  Continue annual screenings.  Normal breast exam today.  Screening for colon cancer - 2023 colonoscopy. Will repeat at 5-year interval per GI's recommendation.   Screening for osteoporosis - Average risk. Will plan DXA at age 69.   Return in 1 year for annual.     Tamela Gammon DNP, 1:38 PM 12/08/2022

## 2022-12-12 LAB — CYTOLOGY - PAP
Comment: NEGATIVE
Diagnosis: NEGATIVE
High risk HPV: NEGATIVE

## 2022-12-16 ENCOUNTER — Telehealth (HOSPITAL_BASED_OUTPATIENT_CLINIC_OR_DEPARTMENT_OTHER): Payer: Medicaid Other | Admitting: Psychiatry

## 2022-12-16 ENCOUNTER — Encounter (HOSPITAL_COMMUNITY): Payer: Self-pay | Admitting: Psychiatry

## 2022-12-16 DIAGNOSIS — F410 Panic disorder [episodic paroxysmal anxiety] without agoraphobia: Secondary | ICD-10-CM

## 2022-12-16 DIAGNOSIS — F4312 Post-traumatic stress disorder, chronic: Secondary | ICD-10-CM | POA: Diagnosis not present

## 2022-12-16 DIAGNOSIS — F323 Major depressive disorder, single episode, severe with psychotic features: Secondary | ICD-10-CM | POA: Diagnosis not present

## 2022-12-16 MED ORDER — DULOXETINE HCL 20 MG PO CPEP: ORAL_CAPSULE | ORAL | 1 refills | Status: DC

## 2022-12-16 MED ORDER — AMITRIPTYLINE HCL 150 MG PO TABS: 150.0000 mg | ORAL_TABLET | Freq: Every day | ORAL | 1 refills | Status: DC

## 2022-12-16 MED ORDER — CLONAZEPAM 0.5 MG PO TABS: 0.5000 mg | ORAL_TABLET | Freq: Two times a day (BID) | ORAL | 1 refills | Status: DC

## 2022-12-16 MED ORDER — ARIPIPRAZOLE 5 MG PO TABS: 5.0000 mg | ORAL_TABLET | Freq: Every morning | ORAL | 1 refills | Status: DC

## 2022-12-16 NOTE — Progress Notes (Signed)
[facility] MD Virtual Progress Note   Patient Location: Home Provider Location: Home Office  I connect with patient by video and verified that I am speaking with correct person by using two identifiers. I discussed the limitations of evaluation and management by telemedicine and the availability of in person appointments. I also discussed with the patient that there may be a patient responsible charge related to this service. The patient expressed understanding and agreed to proceed.  Valerie Richards [id number] 52 y.o.  12/16/2022 10:40 AM  History of Present Illness:  Patient is evaluated by video session.  She continues to struggle with anxiety, depression, hopelessness.  She feels sleep is disrupted.  She worried about a lot especially finances.  She admitted sometimes gets upset and irritable with the kids.  She has panic attack and crying spells.  Her suicidal thoughts are not present but she is still feel hopeless.  Her appetite is somewhat better.  She is very sad because her disability denied again and now she is going to the lawyer to appeal.  Sometimes she forgets to take the Klonopin but whenever she remember it helps the anxiety.  She denies any hallucination, paranoia.  Recently she had a blood work and her vitamin D level is low.  She does not go outside because she gets very anxious and nervous.  She is taking Klonopin and amitriptyline and like to know if she can add something to help the anxiety during the day.  She worried about her family in [place]. She stopped watching news because it make her more anxious.   Past Psychiatric History: History of depression, anxiety, childhood trauma and panic attack.  Saw provider few years ago at [facility] and prescribed Lexapro, Prozac, Effexor, mirtazapine but did not felt it helped.  Prescribed Zoloft, Ambien and hydroxyzine by PCP.  She was recommended inpatient years ago but did not stay in the hospital.    Outpatient  Encounter Medications as of 12/16/2022  Medication Sig   amitriptyline (ELAVIL) 100 MG tablet Take 1 tablet (100 mg total) by mouth at bedtime.   ARIPiprazole (ABILIFY) 5 MG tablet Take 1 tablet (5 mg total) by mouth every morning.   Cholecalciferol (VITAMIN D3) 125 MCG (5000 UT) capsule Take 1 capsule (5,000 Units total) by mouth daily.   clonazePAM (KLONOPIN) 0.5 MG tablet Take 1 tablet (0.5 mg total) by mouth 2 (two) times daily.   diclofenac Sodium (VOLTAREN) 1 % GEL    estradiol (ESTRACE VAGINAL) 0.1 MG/GM vaginal cream Place 1 g vaginally 2 (two) times a week. Initial dose: Nightly x 2 weeks, then twice weekly   LINZESS 290 MCG CAPS capsule Take 1 capsule (290 mcg total) by mouth every morning.   omeprazole (PRILOSEC) 20 MG capsule Take 1 capsule (20 mg total) by mouth daily. START with 1 capsule bid for 2 weeks, then reduce to qd.   OXcarbazepine (TRILEPTAL) 150 MG tablet Take 1 tablet (150 mg total) by mouth 2 (two) times daily.   Rimegepant Sulfate (NURTEC) 75 MG TBDP Take 75 mg by mouth as needed (Take one tablet as needed at the earlist onset of a Migraine.  Max 1 tablet in 24 hours).   tiZANidine (ZANAFLEX) 4 MG tablet Take 0.5-1 tablet at bedtime.   No facility-administered encounter medications on file as of 12/16/2022.    Recent Results (from the past 2160 hour(s))  Comprehensive metabolic panel     Status: None   Collection Time: 11/23/22 11:49 AM  Result Value Ref Range   Sodium 141 135 - 145 mEq/L   Potassium 3.8 3.5 - 5.1 mEq/L   Chloride 104 96 - 112 mEq/L   CO2 28 19 - 32 mEq/L   Glucose, Bld 84 70 - 99 mg/dL   BUN 20 6 - 23 mg/dL   Creatinine, Ser 0.57 0.40 - 1.20 mg/dL   Total Bilirubin 0.9 0.2 - 1.2 mg/dL   Alkaline Phosphatase 60 39 - 117 U/L   AST 15 0 - 37 U/L   ALT 12 0 - 35 U/L   Total Protein 6.8 6.0 - 8.3 g/dL   Albumin 4.5 3.5 - 5.2 g/dL   GFR 104.07 >60.00 mL/min    Comment: Calculated using the CKD-EPI Creatinine Equation (2021)   Calcium 10.2 8.4 -  10.5 mg/dL  TSH     Status: None   Collection Time: 11/23/22 11:49 AM  Result Value Ref Range   TSH 2.15 0.35 - 5.50 uIU/mL  Lipid panel     Status: None   Collection Time: 11/23/22 11:49 AM  Result Value Ref Range   Cholesterol 189 0 - 200 mg/dL    Comment: ATP III Classification       Desirable:  < 200 mg/dL               Borderline High:  200 - 239 mg/dL          High:  > = 240 mg/dL   Triglycerides 64.0 0.0 - 149.0 mg/dL    Comment: Normal:  <150 mg/dLBorderline High:  150 - 199 mg/dL   HDL 80.40 >39.00 mg/dL   VLDL 12.8 0.0 - 40.0 mg/dL   LDL Cholesterol 96 0 - 99 mg/dL   Total CHOL/HDL Ratio 2     Comment:                Men          Women1/2 Average Risk     3.4          3.3Average Risk          5.0          4.42X Average Risk          9.6          7.13X Average Risk          15.0          11.0                       NonHDL 108.42     Comment: NOTE:  Non-HDL goal should be 30 mg/dL higher than patient's LDL goal (i.e. LDL goal of < 70 mg/dL, would have non-HDL goal of < 100 mg/dL)  CBC with Differential/Platelet     Status: None   Collection Time: 11/23/22 11:49 AM  Result Value Ref Range   WBC 4.6 4.0 - 10.5 K/uL   RBC 4.06 3.87 - 5.11 Mil/uL   Hemoglobin 12.6 12.0 - 15.0 g/dL   HCT 37.7 36.0 - 46.0 %   MCV 92.8 78.0 - 100.0 fl   MCHC 33.6 30.0 - 36.0 g/dL   RDW 13.5 11.5 - 15.5 %   Platelets 218.0 150.0 - 400.0 K/uL   Neutrophils Relative % 55.6 43.0 - 77.0 %   Lymphocytes Relative 36.3 12.0 - 46.0 %   Monocytes Relative 5.6 3.0 - 12.0 %   Eosinophils Relative 1.2 0.0 - 5.0 %   Basophils Relative 1.3 0.0 -  3.0 %   Neutro Abs 2.5 1.4 - 7.7 K/uL   Lymphs Abs 1.7 0.7 - 4.0 K/uL   Monocytes Absolute 0.3 0.1 - 1.0 K/uL   Eosinophils Absolute 0.1 0.0 - 0.7 K/uL   Basophils Absolute 0.1 0.0 - 0.1 K/uL  ANA, IFA Comprehensive Panel     Status: None   Collection Time: 11/23/22 11:49 AM  Result Value Ref Range   Anti Nuclear Antibody (ANA) NEGATIVE NEGATIVE    Comment: ANA  IFA is a first line screen for detecting the presence of up to approximately 150 autoantibodies in various autoimmune diseases. A negative ANA IFA result suggests an ANA-associated autoimmune disease is not present at this time, but is not definitive. If there is high clinical suspicion for Sjogren's syndrome, testing for anti-SS-A/Ro antibody should be considered. Anti-Jo-1 antibody should be considered for clinically suspected inflammatory myopathies. . AC-0: Negative . International Consensus on ANA Patterns (https://www.hernandez-brewer.com/) . For additional information, please refer to http://education.[facility].com/faq/FAQ177 (This link is being provided for informational/ educational purposes only.) .    ds DNA Ab 1 IU/mL    Comment:                            IU/mL       Interpretation                            < or = 4    Negative                            5-9         Indeterminate                            > or = 10   Positive .    Scleroderma (Scl-70) (ENA) Antibody, IgG <1.0 NEG <1.0 NEG AI   ENA SM Ab Ser-aCnc <1.0 NEG <1.0 NEG AI   SM/RNP <1.0 NEG <1.0 NEG AI   SSA (Ro) (ENA) Antibody, IgG <1.0 NEG <1.0 NEG AI   SSB (La) (ENA) Antibody, IgG <1.0 NEG <1.0 NEG AI  Autoimmune Profile     Status: None   Collection Time: 11/23/22 11:49 AM  Result Value Ref Range   dsDNA Ab 1 0 - 9 IU/mL    Comment:                                    Negative      <5                                    Equivocal  5 - 9                                    Positive      >9    Complement C3, Serum 101 82 - 167 mg/dL   Anti Nuclear Antibody (ANA) Negative Negative  Rheumatoid Factor     Status: None   Collection Time: 11/23/22 11:49 AM  Result Value Ref Range   Rhuematoid fact SerPl-aCnc <14 <14 IU/mL  Vitamin D (25 hydroxy)  Status: Abnormal   Collection Time: 11/23/22 11:49 AM  Result Value Ref Range   VITD 25.24 (L) 30.00 - 100.00 ng/mL  Cytology - PAP( [facility])     Status: None   Collection Time: 12/08/22  1:45 PM  Result Value Ref Range   High risk HPV Negative    Adequacy      Satisfactory for evaluation; transformation zone component PRESENT.   Diagnosis      - Negative for intraepithelial lesion or malignancy (NILM)   Comment Atrophic changes are present.    Comment Normal Reference Range HPV - Negative      Psychiatric Specialty Exam: Physical Exam  Review of Systems  Weight 120 lb (54.4 kg), last menstrual period 02/26/2020.There is no height or weight on file to calculate BMI.  General Appearance: Casual and wearing Hijjab  Eye Contact:  Good  Speech:  Slow  Volume:  Decreased  Mood:  Anxious and Dysphoric  Affect:  Constricted and Depressed  Thought Process:  Descriptions of Associations: Intact  Orientation:  Full (Time, Place, and Person)  Thought Content:  Rumination  Suicidal Thoughts:  No  Homicidal Thoughts:  No  Memory:  Immediate;   Good Recent;   Good Remote;   Fair  Judgement:  Intact  Insight:  Present  Psychomotor Activity:  Decreased  Concentration:  Concentration: Fair and Attention Span: Fair  Recall:  Good  Fund of Knowledge:  Good  Language:  Good  Akathisia:  No  Handed:  Right  AIMS (if indicated):     Assets:  Communication Skills Desire for Improvement Housing  ADL's:  Intact  Cognition:  WNL  Sleep:  fair     Assessment/Plan: Severe major depression with psychotic features (HCC) - Plan: ARIPiprazole (ABILIFY) 5 MG tablet, clonazePAM (KLONOPIN) 0.5 MG tablet, amitriptyline (ELAVIL) 150 MG tablet  Panic attacks - Plan: clonazePAM (KLONOPIN) 0.5 MG tablet  Chronic post-traumatic stress disorder (PTSD) - Plan: clonazePAM (KLONOPIN) 0.5 MG tablet  I review psychosocial stressors.  She has been experiencing increased anxiety and nervousness.  She is not sleeping well.  I reviewed blood work results.  Recommend to take vitamin D.  Increase amitriptyline 150 mg at bedtime to help the  sleep and we will start Cymbalta 20 mg for 1 week and then 40 mg daily.  Continue Abilify 5 mg daily and reminded to take the Klonopin 0.5 mg 2 times a day to help with anxiety and panic attack.  She had applied appeal for disability and this time she is going through the lawyer.  Discussed medication side effects and benefits.  Recommended to call us back if she has any question or any concern.  Encourage to start walking 10 to 15 minutes a day.  Encourage deep breathing and use resources to help with anxiety.  We will follow-up in 4 to 6 weeks.   Follow Up Instructions:     I discussed the assessment and treatment plan with the patient. The patient was provided an opportunity to ask questions and all were answered. The patient agreed with the plan and demonstrated an understanding of the instructions.   The patient was advised to call back or seek an in-person evaluation if the symptoms worsen or if the condition fails to improve as anticipated.    Collaboration of Care: Other provider involved in patient's care AEB notes are available in epic to review.  Patient/Guardian was advised Release of Information must be obtained prior to any record release in  order to collaborate their care with an outside provider. Patient/Guardian was advised if they have not already done so to contact the registration department to sign all necessary forms in order for [place] to release information regarding their care.   Consent: Patient/Guardian gives verbal consent for treatment and assignment of benefits for services provided during this visit. Patient/Guardian expressed understanding and agreed to proceed.     I provided 25 minutes of non face to face time during this encounter.  Kathlee Nations, MD 12/16/2022

## 2022-12-19 ENCOUNTER — Other Ambulatory Visit (HOSPITAL_COMMUNITY): Payer: Self-pay | Admitting: Psychiatry

## 2022-12-19 DIAGNOSIS — F323 Major depressive disorder, single episode, severe with psychotic features: Secondary | ICD-10-CM

## 2023-01-03 NOTE — Progress Notes (Unsigned)
NEUROLOGY FOLLOW UP OFFICE NOTE  Valerie Richards [id number]  Assessment/Plan:   Chronic migraine without aura, without status migrainosus, not intractable, possibly cervicogenic  Right sided trigeminal neuralgia Cervicalgia with left sided cervical radiculopathy    Migraine prevention: Start Ajovy.  Would not use Aimovig as she already has significant constipation.   For trigeminal neuralgia, restart oxcarbazepine 150mg  BID.  Check BMP in 4 weeks Migraine rescue:  Refill Nurtec  Tizanidine 4mg  up to 3 times daily as needed for neck pain Repeat MRI of C-spine to follow up on vertebral body lesion. Follow up 4 months.     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  She was started on oxcarbazepine for presumed trigeminal neuralgia.  Discontinued propranolol due to hypotension.  Plan was to start Emgality.  She started the starter pack but never received the refills.  Unclear why.  She also has not received Nurtec and oxcarbazepine for 3 months, even though we did send prescriptions back in December.  Unclear what happened.    Migraines are worse. Intensity:  moderate to severe Duration:  5-6 hours Frequency:  16 days a month (50% are severe) Reports increased depression Still dealing with the facial pain Since she wasn't able to get Nurtec, she has been using paracetamol (acetaminophen) which is ineffective.  Referred to pain management for cervical radiculopathy/chronic neck pain.  No improvement  PARACETRAMOL 500MG    Current medications:  Migraine/Pain Preventative/antidepressant:  On amitriptyline 150mg  QHS and Cymbalta 20mg  BID (prescribed by psychiatry) Muscle relaxant/neck pain:  tizanidine  2-4mg  QHS    HISTORY: Migraine: Beginning around September 2021, she developed new onset headaches.  They are severe right frontal pounding headaches associated with nausea, photophobia and sometimes phonophobia.  They sometimes wake her up at night from sleep.  It lasts 2 hours with Tylenol.  They occur every other day.  No known triggers.    Past medication:  topiramate, propranolol (hypotension)  Dizziness: Around this same time, she started having dizziness.  It is positional.  It is a spinning sensation lasting seconds to a couple of minutes.  Associated with blurred vision and sometimes nausea.  Occurs every other day to twice a day.    Cervical spondylosis with radiculopathy: She has chronic neck pain radiating into the right shoulder.  X-ray cervical spine from 06/17/2020 personally reviewed showed degenerative straightening of the cervical lordosis with focally moderate disc space height loss and osteophytosis of C6-C7.  She then started endorsing aching pain on the left side of her neck and arm, sometimes leg.  Also left eye twitches.  Pain in the left knee and ankle.  Reports numbness and tingling in fingers and toes, particularly when she is feeling anxious. Did not respond to PT.  MRI of C-spine on 07/07/2021  showed cervical spondylosis with with minimal spinal stenosis at C3-4 and mild bilateral neural foraminal narrowing at C5-6.  5 mm lesion within C7 vertebral body noted, nonspecific but may reflect an atypical hemangioma.   Right-sided Trigeminal Neuralgia: Beginning in September 2023, she reports paroxsymal shooting pain in the right V2-V3 distribution with right sided facial numbness.  Occurs every other day.     MRI brain with and without contrast on 12/31/2020 showed paranasal sinus disease and  3 mm chronic right cerebellar infarct but no acute abnormality.  Advised to start ASA but stopped because it caused bruising.    PAST MEDICAL HISTORY: Past Medical History:  Diagnosis  Date   Acute recurrent ethmoidal sinusitis 09/03/2018   Anemia    iron deficiency   Anxiety    Fibroid    Menorrhagia 01/20/2021   Nasal pain 09/03/2018   Stroke ([place]) 12/2020   See MRI.   Vaginal discharge 01/20/2021   Vaginitis and vulvovaginitis 01/20/2021    MEDICATIONS: Current Outpatient Medications on File Prior to Visit  Medication Sig Dispense Refill   amitriptyline (ELAVIL) 150 MG tablet Take 1 tablet (150 mg total) by mouth at bedtime. 30 tablet 1   ARIPiprazole (ABILIFY) 5 MG tablet Take 1 tablet (5 mg total) by mouth every morning. 30 tablet 1   Cholecalciferol (VITAMIN D3) 125 MCG (5000 UT) capsule Take 1 capsule (5,000 Units total) by mouth daily. 90 capsule 1   clonazePAM (KLONOPIN) 0.5 MG tablet Take 1 tablet (0.5 mg total) by mouth 2 (two) times daily. 60 tablet 1   diclofenac Sodium (VOLTAREN) 1 % GEL      DULoxetine (CYMBALTA) 20 MG capsule Take one capsule daily for one week and than twice daily 60 capsule 1   estradiol (ESTRACE VAGINAL) 0.1 MG/GM vaginal cream Place 1 g vaginally 2 (two) times a week. Initial dose: Nightly x 2 weeks, then twice weekly 42.5 g 2   LINZESS 290 MCG CAPS capsule Take 1 capsule (290 mcg total) by mouth every morning. 30 capsule 5   omeprazole (PRILOSEC) 20 MG capsule Take 1 capsule (20 mg total) by mouth daily. START with 1 capsule bid for 2 weeks, then reduce to qd. 45 capsule 0   OXcarbazepine (TRILEPTAL) 150 MG tablet Take 1 tablet (150 mg total) by mouth 2 (two) times daily. 60 tablet 5   Rimegepant Sulfate (NURTEC) 75 MG TBDP Take 75 mg by mouth as needed (Take one tablet as needed at the earlist onset of a Migraine.  Max 1 tablet in 24 hours). 16 tablet 5   tiZANidine (ZANAFLEX) 4 MG tablet Take 0.5-1 tablet at bedtime. 30 tablet 5   No current facility-administered medications on file prior to visit.    ALLERGIES: No Known Allergies  FAMILY HISTORY: Family History  Problem Relation Age of Onset   Hypertension Mother     Cancer Father    Diabetes Father    Colon cancer Father    Kidney failure Father    Diabetes Brother       Objective:  Blood pressure 101/63, pulse 61, height 5\' 5"  (1.651 m), weight 118 lb 6.4 oz (53.7 kg), last menstrual period 02/26/2020, SpO2 100 %. General: No acute distress.  Patient appears well-groomed.     Metta Clines, DO  CC: Rachell Cipro, MD

## 2023-01-04 ENCOUNTER — Ambulatory Visit: Payer: BLUE CROSS/BLUE SHIELD | Admitting: Neurology

## 2023-01-04 ENCOUNTER — Encounter: Payer: Self-pay | Admitting: Neurology

## 2023-01-04 VITALS — BP 101/63 | HR 61 | Ht 65.0 in | Wt 118.4 lb

## 2023-01-04 DIAGNOSIS — M5412 Radiculopathy, cervical region: Secondary | ICD-10-CM | POA: Diagnosis not present

## 2023-01-04 DIAGNOSIS — Z79899 Other long term (current) drug therapy: Secondary | ICD-10-CM

## 2023-01-04 DIAGNOSIS — G43701 Chronic migraine without aura, not intractable, with status migrainosus: Secondary | ICD-10-CM

## 2023-01-04 DIAGNOSIS — G43009 Migraine without aura, not intractable, without status migrainosus: Secondary | ICD-10-CM

## 2023-01-04 DIAGNOSIS — G500 Trigeminal neuralgia: Secondary | ICD-10-CM

## 2023-01-04 DIAGNOSIS — M542 Cervicalgia: Secondary | ICD-10-CM

## 2023-01-04 MED ORDER — AJOVY 225 MG/1.5ML ~~LOC~~ SOAJ: 225.0000 mg | SUBCUTANEOUS | 11 refills | Status: DC

## 2023-01-04 MED ORDER — OXCARBAZEPINE 150 MG PO TABS: 150.0000 mg | ORAL_TABLET | Freq: Two times a day (BID) | ORAL | 5 refills | Status: DC

## 2023-01-04 MED ORDER — TIZANIDINE HCL 4 MG PO TABS: 4.0000 mg | ORAL_TABLET | Freq: Three times a day (TID) | ORAL | 5 refills | Status: DC | PRN

## 2023-01-04 MED ORDER — NURTEC 75 MG PO TBDP: 75.0000 mg | ORAL_TABLET | ORAL | 5 refills | Status: DC | PRN

## 2023-01-04 NOTE — Patient Instructions (Addendum)
Start Ajovy injection every 28 days Refilled Nurtec to take as needed.   Take tizanidine for neck pain as needed. Caution for drowsiness For facial pain, restart oxcarbazepine 150mg  twice daily.  If no improvement in 4 weeks, contact me.  Check BMP in 4 weeks. Follow up 5 months.

## 2023-01-05 ENCOUNTER — Telehealth: Payer: Self-pay

## 2023-01-05 NOTE — Telephone Encounter (Signed)
Ajovy and Nurtec PA needed

## 2023-01-24 ENCOUNTER — Telehealth (HOSPITAL_BASED_OUTPATIENT_CLINIC_OR_DEPARTMENT_OTHER): Payer: BLUE CROSS/BLUE SHIELD | Admitting: Psychiatry

## 2023-01-24 ENCOUNTER — Encounter (HOSPITAL_COMMUNITY): Payer: Self-pay | Admitting: Psychiatry

## 2023-01-24 VITALS — Wt 118.0 lb

## 2023-01-24 DIAGNOSIS — F323 Major depressive disorder, single episode, severe with psychotic features: Secondary | ICD-10-CM | POA: Diagnosis not present

## 2023-01-24 DIAGNOSIS — F4312 Post-traumatic stress disorder, chronic: Secondary | ICD-10-CM | POA: Diagnosis not present

## 2023-01-24 DIAGNOSIS — F410 Panic disorder [episodic paroxysmal anxiety] without agoraphobia: Secondary | ICD-10-CM | POA: Diagnosis not present

## 2023-01-24 MED ORDER — CLONAZEPAM 0.5 MG PO TABS: 0.5000 mg | ORAL_TABLET | Freq: Two times a day (BID) | ORAL | 1 refills | Status: DC

## 2023-01-24 MED ORDER — DULOXETINE HCL 60 MG PO CPEP: ORAL_CAPSULE | ORAL | 1 refills | Status: DC

## 2023-01-24 MED ORDER — ARIPIPRAZOLE 5 MG PO TABS: 5.0000 mg | ORAL_TABLET | Freq: Every morning | ORAL | 1 refills | Status: DC

## 2023-01-24 MED ORDER — AMITRIPTYLINE HCL 150 MG PO TABS: 150.0000 mg | ORAL_TABLET | Freq: Every day | ORAL | 1 refills | Status: DC

## 2023-01-24 NOTE — Progress Notes (Signed)
[facility] MD Virtual Progress Note   Patient Location: Home Provider Location: Home Office  I connect with patient by video and verified that I am speaking with correct person by using two identifiers. I discussed the limitations of evaluation and management by telemedicine and the availability of in person appointments. I also discussed with the patient that there may be a patient responsible charge related to this service. The patient expressed understanding and agreed to proceed.  Valerie Richards [id number] 52 y.o.  01/24/2023 10:46 AM  History of Present Illness:  Patient is evaluated by video session.  On the last visit we increased the amitriptyline and started on Cymbalta.  Patient reported sleep is good but also mentioned that some nights she takes the trazodone.  She was told not to take the trazodone since we going up on amitriptyline but patient get into the habit of taking the leftover medication.  I explained she need to make a pillbox or organizer so does not take the medication which is old.  Her 17 year old daughter lives with her and I recommend she should be more involved in organizing the pillbox.  Patient admitted lately having forgetfulness, memory issues and does not like to talk to the people as she tends to forget the name and conversation.  She reported Klonopin helps the anxiety and panic attack.  She still have nightmares and flashback.  She does reported fear and nervousness especially at nighttime.  She started seeing a therapist at [facility].  She liked the therapist and wants to continue.  She had submitted paperwork for disability through her attorney.  When I ask if she is taking Cymbalta she replied that she does not know the name of the medication but she is taking 2 times a day and it is helping.  Recently she saw a neurologist for headaches and while the medication she is waiting for prior authorization.  She has headaches.  She enjoys  now the company of grandkids.  She has a 72-year-old and recently of 37 days old grandchild.  She is happy to see them.  She denies any paranoia, hallucination, suicidal thoughts.  She has no tremors or shakes.  She still have a lot of worries about her family who live in [place] but lately stopped watching the news.  Her appetite is okay.  Patient reported last month her sleep schedule was off because of Ramadan but now she is back in her routine.  She does try to go outside for a walk.  Her energy level is fair.  Her appetite is fair.  She lost few pounds but she feels her eating habit is better.  Past Psychiatric History: History of depression, anxiety, childhood trauma and panic attack.  Saw provider few years ago at [facility] and prescribed Lexapro, Prozac, Effexor, mirtazapine but did not felt it helped.  Prescribed Zoloft, Ambien and hydroxyzine by PCP.  She was recommended inpatient years ago but did not stay in the hospital.    Outpatient Encounter Medications as of 01/24/2023  Medication Sig   amitriptyline (ELAVIL) 150 MG tablet Take 1 tablet (150 mg total) by mouth at bedtime.   ARIPiprazole (ABILIFY) 5 MG tablet Take 1 tablet (5 mg total) by mouth every morning.   Cholecalciferol (VITAMIN D3) 125 MCG (5000 UT) capsule Take 1 capsule (5,000 Units total) by mouth daily.   clonazePAM (KLONOPIN) 0.5 MG tablet Take 1 tablet (0.5 mg total) by mouth 2 (two) times daily.   diclofenac  Sodium (VOLTAREN) 1 % GEL    DULoxetine (CYMBALTA) 20 MG capsule Take one capsule daily for one week and than twice daily   estradiol (ESTRACE VAGINAL) 0.1 MG/GM vaginal cream Place 1 g vaginally 2 (two) times a week. Initial dose: Nightly x 2 weeks, then twice weekly   Fremanezumab-vfrm (AJOVY) 225 MG/1.5ML SOAJ Inject 225 mg into the skin every 28 (twenty-eight) days.   LINZESS 290 MCG CAPS capsule Take 1 capsule (290 mcg total) by mouth every morning.   omeprazole (PRILOSEC) 20 MG capsule Take 1 capsule (20 mg  total) by mouth daily. START with 1 capsule bid for 2 weeks, then reduce to qd.   OXcarbazepine (TRILEPTAL) 150 MG tablet Take 1 tablet (150 mg total) by mouth 2 (two) times daily.   Rimegepant Sulfate (NURTEC) 75 MG TBDP Take 1 tablet (75 mg total) by mouth as needed (Take one tablet as needed at the earlist onset of a Migraine.  Max 1 tablet in 24 hours).   tiZANidine (ZANAFLEX) 4 MG tablet Take 1 tablet (4 mg total) by mouth 3 (three) times daily as needed for muscle spasms.   No facility-administered encounter medications on file as of 01/24/2023.    Recent Results (from the past 2160 hour(s))  Comprehensive metabolic panel     Status: None   Collection Time: 11/23/22 11:49 AM  Result Value Ref Range   Sodium 141 135 - 145 mEq/L   Potassium 3.8 3.5 - 5.1 mEq/L   Chloride 104 96 - 112 mEq/L   CO2 28 19 - 32 mEq/L   Glucose, Bld 84 70 - 99 mg/dL   BUN 20 6 - 23 mg/dL   Creatinine, Ser 1.61 0.40 - 1.20 mg/dL   Total Bilirubin 0.9 0.2 - 1.2 mg/dL   Alkaline Phosphatase 60 39 - 117 U/L   AST 15 0 - 37 U/L   ALT 12 0 - 35 U/L   Total Protein 6.8 6.0 - 8.3 g/dL   Albumin 4.5 3.5 - 5.2 g/dL   GFR 096.04 >54.09 mL/min    Comment: Calculated using the CKD-EPI Creatinine Equation (2021)   Calcium 10.2 8.4 - 10.5 mg/dL  TSH     Status: None   Collection Time: 11/23/22 11:49 AM  Result Value Ref Range   TSH 2.15 0.35 - 5.50 uIU/mL  Lipid panel     Status: None   Collection Time: 11/23/22 11:49 AM  Result Value Ref Range   Cholesterol 189 0 - 200 mg/dL    Comment: ATP III Classification       Desirable:  < 200 mg/dL               Borderline High:  200 - 239 mg/dL          High:  > = 811 mg/dL   Triglycerides 91.4 0.0 - 149.0 mg/dL    Comment: Normal:  <782 mg/dLBorderline High:  150 - 199 mg/dL   HDL 95.62 >13.08 mg/dL   VLDL 65.7 0.0 - 84.6 mg/dL   LDL Cholesterol 96 0 - 99 mg/dL   Total CHOL/HDL Ratio 2     Comment:                Men          Women1/2 Average Risk     3.4           3.3Average Risk          5.0  4.42X Average Risk          9.6          7.13X Average Risk          15.0          11.0                       NonHDL 108.42     Comment: NOTE:  Non-HDL goal should be 30 mg/dL higher than patient's LDL goal (i.e. LDL goal of < 70 mg/dL, would have non-HDL goal of < 100 mg/dL)  CBC with Differential/Platelet     Status: None   Collection Time: 11/23/22 11:49 AM  Result Value Ref Range   WBC 4.6 4.0 - 10.5 K/uL   RBC 4.06 3.87 - 5.11 Mil/uL   Hemoglobin 12.6 12.0 - 15.0 g/dL   HCT 16.1 09.6 - 04.5 %   MCV 92.8 78.0 - 100.0 fl   MCHC 33.6 30.0 - 36.0 g/dL   RDW 40.9 81.1 - 91.4 %   Platelets 218.0 150.0 - 400.0 K/uL   Neutrophils Relative % 55.6 43.0 - 77.0 %   Lymphocytes Relative 36.3 12.0 - 46.0 %   Monocytes Relative 5.6 3.0 - 12.0 %   Eosinophils Relative 1.2 0.0 - 5.0 %   Basophils Relative 1.3 0.0 - 3.0 %   Neutro Abs 2.5 1.4 - 7.7 K/uL   Lymphs Abs 1.7 0.7 - 4.0 K/uL   Monocytes Absolute 0.3 0.1 - 1.0 K/uL   Eosinophils Absolute 0.1 0.0 - 0.7 K/uL   Basophils Absolute 0.1 0.0 - 0.1 K/uL  ANA, IFA Comprehensive Panel     Status: None   Collection Time: 11/23/22 11:49 AM  Result Value Ref Range   Anti Nuclear Antibody (ANA) NEGATIVE NEGATIVE    Comment: ANA IFA is a first line screen for detecting the presence of up to approximately 150 autoantibodies in various autoimmune diseases. A negative ANA IFA result suggests an ANA-associated autoimmune disease is not present at this time, but is not definitive. If there is high clinical suspicion for Sjogren's syndrome, testing for anti-SS-A/Ro antibody should be considered. Anti-Jo-1 antibody should be considered for clinically suspected inflammatory myopathies. . AC-0: Negative . International Consensus on ANA Patterns (SeverTies.uy) . For additional information, please refer to http://education.[facility].com/faq/FAQ177 (This link is being provided for  informational/ educational purposes only.) .    ds DNA Ab 1 IU/mL    Comment:                            IU/mL       Interpretation                            < or = 4    Negative                            5-9         Indeterminate                            > or = 10   Positive .    Scleroderma (Scl-70) (ENA) Antibody, IgG <1.0 NEG <1.0 NEG AI   ENA SM Ab Ser-aCnc <1.0 NEG <1.0 NEG AI   SM/RNP <1.0 NEG <1.0 NEG AI   SSA (  Ro) (ENA) Antibody, IgG <1.0 NEG <1.0 NEG AI   SSB (La) (ENA) Antibody, IgG <1.0 NEG <1.0 NEG AI  Autoimmune Profile     Status: None   Collection Time: 11/23/22 11:49 AM  Result Value Ref Range   dsDNA Ab 1 0 - 9 IU/mL    Comment:                                    Negative      <5                                    Equivocal  5 - 9                                    Positive      >9    Complement C3, Serum 101 82 - 167 mg/dL   Anti Nuclear Antibody (ANA) Negative Negative  Rheumatoid Factor     Status: None   Collection Time: 11/23/22 11:49 AM  Result Value Ref Range   Rheumatoid fact SerPl-aCnc <14 <14 IU/mL  Vitamin D (25 hydroxy)     Status: Abnormal   Collection Time: 11/23/22 11:49 AM  Result Value Ref Range   VITD 25.24 (L) 30.00 - 100.00 ng/mL  Cytology - PAP( [place])     Status: None   Collection Time: 12/08/22  1:45 PM  Result Value Ref Range   High risk HPV Negative    Adequacy      Satisfactory for evaluation; transformation zone component PRESENT.   Diagnosis      - Negative for intraepithelial lesion or malignancy (NILM)   Comment Atrophic changes are present.    Comment Normal Reference Range HPV - Negative      Psychiatric Specialty Exam: Physical Exam  Review of Systems  Neurological:  Positive for headaches.    Weight 118 lb (53.5 kg), last menstrual period 02/26/2020.There is no height or weight on file to calculate BMI.  General Appearance: Casual and wearing hijjab  Eye Contact:  Good  Speech:  Slow  Volume:   Decreased  Mood:  Anxious and Dysphoric  Affect:  Depressed  Thought Process:  Descriptions of Associations: Intact  Orientation:  Full (Time, Place, and Person)  Thought Content:  Rumination  Suicidal Thoughts:  No  Homicidal Thoughts:  No  Memory:  Immediate;   Fair Recent;   Fair Remote;   Fair  Judgement:  Fair  Insight:  Shallow  Psychomotor Activity:  Decreased  Concentration:  Concentration: Fair and Attention Span: Fair  Recall:  [employer] of Knowledge:  Fair  Language:  Fair  Akathisia:  No  Handed:  Right  AIMS (if indicated):     Assets:  Communication Skills Desire for Improvement Housing Social Support  ADL's:  Intact  Cognition:  WNL  Sleep:  ok     Assessment/Plan: Severe major depression with psychotic features - Plan: ARIPiprazole (ABILIFY) 5 MG tablet, clonazePAM (KLONOPIN) 0.5 MG tablet, DULoxetine (CYMBALTA) 60 MG capsule, amitriptyline (ELAVIL) 150 MG tablet  Panic attacks - Plan: clonazePAM (KLONOPIN) 0.5 MG tablet  Chronic post-traumatic stress disorder (PTSD) - Plan: clonazePAM (KLONOPIN) 0.5 MG tablet  Discuss medication compliance, not to take leftover medication and to check  the expiration date.  Recommend not to take the trazodone and in the future should ask her daughter to be present in the appointments.  Patient overall liked the medication.  She is seeing a neurology for her headaches and I recommend should also consider talking to her neurologist about her chronic memory issues.  She may need a neurocognitive testing and dementia workup.  She tends to forget things and sometimes does not feel talking to people and avoid conversation as she has loss of train of thoughts.  I will forward my note to her neurology.  Recommend to stay compliant with medication.  Recommend to avoid polypharmacy.  Encouraged to keep appointment with therapist.  Patient has applied for disability through lawyer and like to send records if needed.  I will apply if the  lawyer contact was then we will send the records.  Continue Klonopin 0.5 mg 2 times a day as needed, Abilify 5 mg daily, amitriptyline 150 mg at bedtime and Cymbalta changed to 60 mg daily to help her compliance.  I explained that we are going to increase the Cymbalta from 40 mg to 60 mg to help her residual anxiety and nervousness.  She is also on Trileptal prescribed by neurology for headaches.  Discussed safety concerns and any time having active suicidal thoughts or homicidal halogen need to call 911 or go to local emergency room.  Recommend should have a daughter present on her next appointment.  Follow-up in 6 weeks.   Follow Up Instructions:     I discussed the assessment and treatment plan with the patient. The patient was provided an opportunity to ask questions and all were answered. The patient agreed with the plan and demonstrated an understanding of the instructions.   The patient was advised to call back or seek an in-person evaluation if the symptoms worsen or if the condition fails to improve as anticipated.    Collaboration of Care: Other provider involved in patient's care AEB notes are available in epic to review.  Patient/Guardian was advised Release of Information must be obtained prior to any record release in order to collaborate their care with an outside provider. Patient/Guardian was advised if they have not already done so to contact the registration department to sign all necessary forms in order for [place] to release information regarding their care.   Consent: Patient/Guardian gives verbal consent for treatment and assignment of benefits for services provided during this visit. Patient/Guardian expressed understanding and agreed to proceed.     I provided 30 minutes of non face to face time during this encounter.  Note: This document was prepared by [employer] voice dictation technology and any errors that results from this process are unintentional.    Cleotis Nipper,  MD 01/24/2023

## 2023-01-27 ENCOUNTER — Other Ambulatory Visit (HOSPITAL_COMMUNITY): Payer: Self-pay

## 2023-01-27 ENCOUNTER — Telehealth: Payer: Self-pay | Admitting: Pharmacy Technician

## 2023-01-27 NOTE — Telephone Encounter (Signed)
Patient Advocate Encounter  Received notification from [facility] that prior authorization for AJOVY is required.   PA submitted on 4.19.24 Key [id number] Status is pending

## 2023-01-27 NOTE — Telephone Encounter (Signed)
Questions have generated and both PA's have been submitted as expedited.

## 2023-01-27 NOTE — Telephone Encounter (Signed)
[employer] called in stating they need more information to process the prior auth for the Nurtec. They are needing the initial coverage portion form to be filled out or the provider can call and give the answers verbally.

## 2023-01-27 NOTE — Telephone Encounter (Signed)
PA for both Nurtect and Ajovy have been processed as of today, however questions have not generated as of yet. Will follow up with routing encounter.

## 2023-01-27 NOTE — Telephone Encounter (Signed)
Patient Advocate Encounter  Received notification from [facility] that prior authorization for NURTEC  is required.   PA submitted on 4.19.24 Key [id number] Status is pending

## 2023-01-30 NOTE — Telephone Encounter (Signed)
Nurtec PA Denied:

## 2023-01-30 NOTE — Telephone Encounter (Signed)
PA Approved

## 2023-02-01 ENCOUNTER — Other Ambulatory Visit (INDEPENDENT_AMBULATORY_CARE_PROVIDER_SITE_OTHER): Payer: BLUE CROSS/BLUE SHIELD

## 2023-02-01 ENCOUNTER — Telehealth: Payer: Self-pay | Admitting: Neurology

## 2023-02-01 DIAGNOSIS — Z79899 Other long term (current) drug therapy: Secondary | ICD-10-CM | POA: Diagnosis not present

## 2023-02-01 LAB — BASIC METABOLIC PANEL
BUN: 21 mg/dL (ref 6–23)
CO2: 28 mEq/L (ref 19–32)
Calcium: 9.5 mg/dL (ref 8.4–10.5)
Chloride: 103 mEq/L (ref 96–112)
Creatinine, Ser: 0.66 mg/dL (ref 0.40–1.20)
GFR: 100.32 mL/min (ref >60.00)
Glucose, Bld: 93 mg/dL (ref 70–99)
Potassium: 3.6 mEq/L (ref 3.5–5.1)
Sodium: 141 mEq/L (ref 135–145)

## 2023-02-01 NOTE — Telephone Encounter (Signed)
Pt came in to get her labs done and asked about what she should do about her Nurtec that the insurance is not covering?

## 2023-02-02 ENCOUNTER — Telehealth: Payer: Self-pay | Admitting: Neurology

## 2023-02-02 NOTE — Telephone Encounter (Signed)
[employer] called in stating the pt's Nurtec prior authorization was denied.

## 2023-02-03 NOTE — Telephone Encounter (Signed)
See other phone note. Patient is aware of denial.

## 2023-02-03 NOTE — Progress Notes (Signed)
Patient advised of lab results

## 2023-02-06 ENCOUNTER — Telehealth: Payer: Self-pay | Admitting: Neurology

## 2023-02-06 NOTE — Telephone Encounter (Signed)
Pt called in and left a message with the access nurse. She is very fatigued, weak, and having trouble getting out of bed. She is having memory loss.

## 2023-02-06 NOTE — Telephone Encounter (Signed)
E-Appeal has been submitted  Key: [id number]

## 2023-02-06 NOTE — Telephone Encounter (Signed)
LMOVM for patient to call back.

## 2023-02-08 NOTE — Telephone Encounter (Signed)
Pt came in to get her labs done and asked about what she should do about her Nurtec that the insurance is not covering? 

## 2023-02-09 ENCOUNTER — Telehealth: Payer: Self-pay

## 2023-02-09 NOTE — Telephone Encounter (Signed)
PA request received via CMM for Linzess capsules  PA has been submitted to [facility] and is pending additional questions/ determination  Key: [id number]

## 2023-02-10 NOTE — Telephone Encounter (Signed)
Patient advised of Dr.jaffe note, When we got the denial for Nurtec, I stated that I would like her to try samples of Ubrelvy    Patient to stop by Monday to pick up samples

## 2023-02-15 ENCOUNTER — Other Ambulatory Visit: Payer: Self-pay | Admitting: Nurse Practitioner

## 2023-02-15 ENCOUNTER — Telehealth: Payer: Self-pay

## 2023-02-15 DIAGNOSIS — N763 Subacute and chronic vulvitis: Secondary | ICD-10-CM

## 2023-02-15 MED ORDER — TRIAMCINOLONE ACETONIDE 0.1 % EX CREA: 1.0000 | TOPICAL_CREAM | Freq: Two times a day (BID) | CUTANEOUS | 0 refills | Status: DC

## 2023-02-15 NOTE — Telephone Encounter (Signed)
Refill for triamcinolone provided. Thanks.

## 2023-02-15 NOTE — Telephone Encounter (Addendum)
Patient called c/o external itching and burning.  No UTI sx. No odor.  She said she had a cream for this in the past and she would like that Rx again.  She did not recall the name of the Rx.  I see an 05/2021 visit where she c/o similar sx. "RP: Vulvo-vaginal irritation with discharge   HPI: Continued Vulvo-vaginal irritation with discharge.  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 I suggested you might recommend office visit to diagnose and she said really hard for her to get here if possible she would like to try the cream.

## 2023-02-15 NOTE — Telephone Encounter (Signed)
Spoke with patient and informed her. °

## 2023-02-16 NOTE — Telephone Encounter (Signed)
Patient Advocate Encounter  Received a fax from [employer] regarding Prior Authorization for Linzess capsules.   Key: [id number]  Authorization has been DENIED due to    Determination letter attached to patient chart

## 2023-02-17 NOTE — Telephone Encounter (Signed)
Hi Morrie Sheldon - I thought she was getting this thru her Medicaid insurance previously? Can it not be run thru them?

## 2023-03-01 ENCOUNTER — Telehealth (HOSPITAL_BASED_OUTPATIENT_CLINIC_OR_DEPARTMENT_OTHER): Payer: BLUE CROSS/BLUE SHIELD | Admitting: Psychiatry

## 2023-03-01 ENCOUNTER — Encounter (HOSPITAL_COMMUNITY): Payer: Self-pay | Admitting: Psychiatry

## 2023-03-01 VITALS — Wt 118.0 lb

## 2023-03-01 DIAGNOSIS — F410 Panic disorder [episodic paroxysmal anxiety] without agoraphobia: Secondary | ICD-10-CM | POA: Diagnosis not present

## 2023-03-01 DIAGNOSIS — F4312 Post-traumatic stress disorder, chronic: Secondary | ICD-10-CM

## 2023-03-01 DIAGNOSIS — F323 Major depressive disorder, single episode, severe with psychotic features: Secondary | ICD-10-CM

## 2023-03-01 MED ORDER — AMITRIPTYLINE HCL 150 MG PO TABS: 150.0000 mg | ORAL_TABLET | Freq: Every day | ORAL | 1 refills | Status: DC

## 2023-03-01 MED ORDER — CLONAZEPAM 0.5 MG PO TABS
0.5000 mg | ORAL_TABLET | Freq: Two times a day (BID) | ORAL | 1 refills | Status: DC
Start: 2023-03-01 — End: 2023-05-08

## 2023-03-01 MED ORDER — LAMOTRIGINE 25 MG PO TABS
25.0000 mg | ORAL_TABLET | Freq: Every day | ORAL | 1 refills | Status: DC
Start: 2023-03-01 — End: 2023-05-08

## 2023-03-01 NOTE — Progress Notes (Signed)
[name] Virtual Progress Note   Patient Location: Home Provider Location: Home Office  I connect with patient by telephone and verified that I am speaking with correct person by using two identifiers. I discussed the limitations of evaluation and management by telemedicine and the availability of in person appointments. I also discussed with the patient that there may be a patient responsible charge related to this service. The patient expressed understanding and agreed to proceed.  Valerie Richards [id number] 52 y.o.  03/01/2023 2:46 PM  History of Present Illness:  Patient is evaluated by phone session.  Her phone is not working so she cannot do video session.  She reported a lot of anxiety, nervousness and lately having a lot of GI side effects.  She is clear and she stopped taking Abilify, Cymbalta.  She is taking as needed Klonopin to help her anxiety and amitriptyline to help her sleep.  She appeared very nervous, anxious.  Currently she is helping her daughter who has a second baby.  She started to have a lot of rumination, nightmares, flashback and dream about her husband.  She feels very scared at night.  She started therapy at [facility] counseling but sometimes it is not consistent because therapist her changing.  Her appetite is okay.  She has called few times her doctor about GI symptoms.  She also reported chronic health issues including memory issues, headaches, chronic pain.  She was given headache medicine but insurance did not approve.  She worries about her family member who live in [place].  She has no tremor or shakes or any EPS.  She has now disability hearing coming up and that is also making her very nervous and anxious.  Sometimes she like to go to walk but get very nervous and anxious around people.  Past Psychiatric History: H/O depression, anxiety, childhood trauma and panic attack.  Saw provider few years ago at [facility] and prescribed Lexapro, Prozac,  Effexor, mirtazapine but did not felt it helped.  Prescribed Zoloft, Ambien and hydroxyzine by PCP.  We tried Cymbalta and Abilify. She was recommended inpatient years ago but did not stay in the hospital.    Outpatient Encounter Medications as of 03/01/2023  Medication Sig   amitriptyline (ELAVIL) 150 MG tablet Take 1 tablet (150 mg total) by mouth at bedtime.   ARIPiprazole (ABILIFY) 5 MG tablet Take 1 tablet (5 mg total) by mouth every morning.   Cholecalciferol (VITAMIN D3) 125 MCG (5000 UT) capsule Take 1 capsule (5,000 Units total) by mouth daily.   clonazePAM (KLONOPIN) 0.5 MG tablet Take 1 tablet (0.5 mg total) by mouth 2 (two) times daily.   diclofenac Sodium (VOLTAREN) 1 % GEL    DULoxetine (CYMBALTA) 60 MG capsule Take one capsule daily   estradiol (ESTRACE VAGINAL) 0.1 MG/GM vaginal cream Place 1 g vaginally 2 (two) times a week. Initial dose: Nightly x 2 weeks, then twice weekly   Fremanezumab-vfrm (AJOVY) 225 MG/1.5ML SOAJ Inject 225 mg into the skin every 28 (twenty-eight) days.   LINZESS 290 MCG CAPS capsule Take 1 capsule (290 mcg total) by mouth every morning.   omeprazole (PRILOSEC) 20 MG capsule Take 1 capsule (20 mg total) by mouth daily. START with 1 capsule bid for 2 weeks, then reduce to qd.   OXcarbazepine (TRILEPTAL) 150 MG tablet Take 1 tablet (150 mg total) by mouth 2 (two) times daily.   Rimegepant Sulfate (NURTEC) 75 MG TBDP Take 1 tablet (75 mg total) by mouth  as needed (Take one tablet as needed at the earlist onset of a Migraine.  Max 1 tablet in 24 hours).   tiZANidine (ZANAFLEX) 4 MG tablet Take 1 tablet (4 mg total) by mouth 3 (three) times daily as needed for muscle spasms.   triamcinolone cream (KENALOG) 0.1 % Apply 1 Application topically 2 (two) times daily.   No facility-administered encounter medications on file as of 03/01/2023.    Recent Results (from the past 2160 hour(s))  Cytology - PAP( [place])     Status: None   Collection Time: 12/08/22   1:45 PM  Result Value Ref Range   High risk HPV Negative    Adequacy      Satisfactory for evaluation; transformation zone component PRESENT.   Diagnosis      - Negative for intraepithelial lesion or malignancy (NILM)   Comment Atrophic changes are present.    Comment Normal Reference Range HPV - Negative   Basic metabolic panel     Status: None   Collection Time: 02/01/23  9:54 AM  Result Value Ref Range   Sodium 141 135 - 145 mEq/L   Potassium 3.6 3.5 - 5.1 mEq/L   Chloride 103 96 - 112 mEq/L   CO2 28 19 - 32 mEq/L   Glucose, Bld 93 70 - 99 mg/dL   BUN 21 6 - 23 mg/dL   Creatinine, Ser 1.61 0.40 - 1.20 mg/dL   GFR 096.04 >54.09 mL/min    Comment: Calculated using the CKD-EPI Creatinine Equation (2021)   Calcium 9.5 8.4 - 10.5 mg/dL     Psychiatric Specialty Exam: Physical Exam  Review of Systems  Constitutional:  Positive for appetite change.  Gastrointestinal:  Positive for abdominal pain and nausea.  Neurological:  Positive for headaches.  Psychiatric/Behavioral:  Positive for dysphoric mood and sleep disturbance. The patient is nervous/anxious.     Weight 118 lb (53.5 kg), last menstrual period 02/26/2020.There is no height or weight on file to calculate BMI.  General Appearance: NA  Eye Contact:  NA  Speech:  Slow  Volume:  Decreased  Mood:  Dysphoric  Affect:  NA  Thought Process:  Descriptions of Associations: Intact  Orientation:  Full (Time, Place, and Person)  Thought Content:  Rumination  Suicidal Thoughts:  No  Homicidal Thoughts:  No  Memory:  Immediate;   Fair Recent;   Good Remote;   Fair  Judgement:  Fair  Insight:  Shallow  Psychomotor Activity:  NA  Concentration:  Concentration: Fair and Attention Span: Fair  Recall:  Good  Fund of Knowledge:  Fair  Language:  Fair  Akathisia:  No  Handed:  Right  AIMS (if indicated):     Assets:  Communication Skills Desire for Improvement Housing Social Support  ADL's:  Intact  Cognition:  WNL   Sleep:  fair     Assessment/Plan: Chronic post-traumatic stress disorder (PTSD) - Plan: lamoTRIgine (LAMICTAL) 25 MG tablet, clonazePAM (KLONOPIN) 0.5 MG tablet  Severe major depression with psychotic features (HCC) - Plan: lamoTRIgine (LAMICTAL) 25 MG tablet, clonazePAM (KLONOPIN) 0.5 MG tablet, amitriptyline (ELAVIL) 150 MG tablet  Panic attacks - Plan: clonazePAM (KLONOPIN) 0.5 MG tablet  Patient stopped taking the Abilify and Cymbalta.  She occasionally take Klonopin but realizes it helps her anxiety when she takes.  She like to keep the amitriptyline.  She is not sure what is causing nausea, vomiting and abdominal pain.  She had called Dr. And leave a message.  She is scared to  go back to medication.  She is open to try a new medication.  Recommend she can try Lamictal which she has never tried before.  We will start a very low dose 25 mg daily to help with mood symptoms.  I also encouraged to continue therapy at [facility] counseling to address her chronic PTSD, anxiety and depression.  I also encouraged to take Klonopin twice a day which is helping her anxiety but patient does not take medicine as prescribed.  Continue amitriptyline 150 mg at bedtime.  Follow-up in 2 months.   Follow Up Instructions:     I discussed the assessment and treatment plan with the patient. The patient was provided an opportunity to ask questions and all were answered. The patient agreed with the plan and demonstrated an understanding of the instructions.   The patient was advised to call back or seek an in-person evaluation if the symptoms worsen or if the condition fails to improve as anticipated.    Collaboration of Care: Other provider involved in patient's care AEB notes are available in epic to review.  Patient/Guardian was advised Release of Information must be obtained prior to any record release in order to collaborate their care with an outside provider. Patient/Guardian was advised if they have not  already done so to contact the registration department to sign all necessary forms in order for [place] to release information regarding their care.   Consent: Patient/Guardian gives verbal consent for treatment and assignment of benefits for services provided during this visit. Patient/Guardian expressed understanding and agreed to proceed.     I provided 28 minutes of non face to face time during this encounter.  Note: This document was prepared by [employer] voice dictation technology and any errors that results from this process are unintentional.    Cleotis Nipper, MD 03/01/2023

## 2023-03-09 ENCOUNTER — Encounter: Payer: Self-pay | Admitting: Family Medicine

## 2023-03-09 ENCOUNTER — Ambulatory Visit (INDEPENDENT_AMBULATORY_CARE_PROVIDER_SITE_OTHER): Payer: BLUE CROSS/BLUE SHIELD | Admitting: Family Medicine

## 2023-03-09 VITALS — BP 99/65 | HR 61 | Temp 97.9°F | Resp 18 | Ht 65.0 in | Wt 121.2 lb

## 2023-03-09 DIAGNOSIS — R413 Other amnesia: Secondary | ICD-10-CM

## 2023-03-09 DIAGNOSIS — M2550 Pain in unspecified joint: Secondary | ICD-10-CM | POA: Diagnosis not present

## 2023-03-09 DIAGNOSIS — R252 Cramp and spasm: Secondary | ICD-10-CM

## 2023-03-09 LAB — SEDIMENTATION RATE: Sed Rate: 3 mm/hr (ref 0–30)

## 2023-03-09 LAB — VITAMIN D 25 HYDROXY (VIT D DEFICIENCY, FRACTURES): VITD: 44.72 ng/mL (ref 30.00–100.00)

## 2023-03-09 LAB — MAGNESIUM: Magnesium: 2 mg/dL (ref 1.5–2.5)

## 2023-03-09 LAB — VITAMIN B12: Vitamin B-12: 422 pg/mL (ref 211–911)

## 2023-03-09 LAB — C-REACTIVE PROTEIN: CRP: <1 mg/dL (ref 0.5–20.0)

## 2023-03-09 NOTE — Patient Instructions (Signed)
[facility] [place]  [address] on the 1st floor Phone number [phone]  

## 2023-03-09 NOTE — Progress Notes (Signed)
Subjective:     Patient ID: Valerie Richards, female    DOB: 1971-07-15, 52 y.o.   MRN: [id number]  Chief Complaint  Patient presents with   Pain    Pain in groin area when walking that started a month ago, getting worse Pain in lower back, arms, legs, legs are worse Abdominal pain, feel nauseated all the time   Memory Issues    Forgetfulness     HPI  Overall "not feeling good".  Pain all over.  Knees and legs bad to walk and wake her up.  Takes tylenol.  Cramps in legs and feet can "curl".    Some joint pain and some cramps day or night.  Upper arms/shoulders, etc hurt.  Doesn't do a lot to cause pain.  When gets up from sitting, hard to get going.  Morning stiffness as well.  Foot can slide out from her.  Feels like some weakness(will have to pick up legs at times).   Knees and feet can be burning or numb/tingle.  When walking, feels like "needle sticking me in female area".   Very stressed-in counseling. Memory-losing things easily. Can't recall why in room.  Forgets if putting gas in car, missing appointments, forgetting names.  Forgets to pay bills.  Has talked to neurology in past.    Health Maintenance Due  Topic Date Due   DTaP/Tdap/Td (1 - Tdap) Never done    Past Medical History:  Diagnosis Date   Acute recurrent ethmoidal sinusitis 09/03/2018   Anemia    iron deficiency   Anxiety    Fibroid    Menorrhagia 01/20/2021   Migraine    Nasal pain 09/03/2018   Stroke (HCC) 12/2020   See MRI.   Vaginal discharge 01/20/2021   Vaginitis and vulvovaginitis 01/20/2021    Past Surgical History:  Procedure Laterality Date   EYE SURGERY Bilateral    TONSILLECTOMY       Current Outpatient Medications:    amitriptyline (ELAVIL) 150 MG tablet, Take 1 tablet (150 mg total) by mouth at bedtime., Disp: 30 tablet, Rfl: 1   ARIPiprazole (ABILIFY) 5 MG tablet, Take 1 tablet (5 mg total) by mouth every morning., Disp: 30 tablet, Rfl: 1   Cholecalciferol (VITAMIN D3) 125 MCG  (5000 UT) capsule, Take 1 capsule (5,000 Units total) by mouth daily., Disp: 90 capsule, Rfl: 1   clonazePAM (KLONOPIN) 0.5 MG tablet, Take 1 tablet (0.5 mg total) by mouth 2 (two) times daily., Disp: 60 tablet, Rfl: 1   estradiol (ESTRACE VAGINAL) 0.1 MG/GM vaginal cream, Place 1 g vaginally 2 (two) times a week. Initial dose: Nightly x 2 weeks, then twice weekly, Disp: 42.5 g, Rfl: 2   Fremanezumab-vfrm (AJOVY) 225 MG/1.5ML SOAJ, Inject 225 mg into the skin every 28 (twenty-eight) days., Disp: 1.68 mL, Rfl: 11   lamoTRIgine (LAMICTAL) 25 MG tablet, Take 1 tablet (25 mg total) by mouth daily., Disp: 30 tablet, Rfl: 1   LINZESS 290 MCG CAPS capsule, Take 1 capsule (290 mcg total) by mouth every morning., Disp: 30 capsule, Rfl: 5   Melatonin 10 MG CAPS, Take by mouth., Disp: , Rfl:    mirtazapine (REMERON) 15 MG tablet, Take 1 tab at night, Disp: , Rfl:    omeprazole (PRILOSEC) 20 MG capsule, Take 1 capsule (20 mg total) by mouth daily. START with 1 capsule bid for 2 weeks, then reduce to qd., Disp: 45 capsule, Rfl: 0   Rimegepant Sulfate (NURTEC) 75 MG TBDP, Take 1 tablet (  75 mg total) by mouth as needed (Take one tablet as needed at the earlist onset of a Migraine.  Max 1 tablet in 24 hours)., Disp: 16 tablet, Rfl: 5   tiZANidine (ZANAFLEX) 4 MG tablet, Take 1 tablet (4 mg total) by mouth 3 (three) times daily as needed for muscle spasms., Disp: 90 tablet, Rfl: 5   triamcinolone cream (KENALOG) 0.1 %, Apply 1 Application topically 2 (two) times daily., Disp: 30 g, Rfl: 0  No Known Allergies ROS neg/noncontributory except as noted HPI/below      Objective:     BP 99/65   Pulse 61   Temp 97.9 F (36.6 C) (Temporal)   Resp 18   Ht 5\' 5"  (1.651 m)   Wt 121 lb 4 oz (55 kg)   LMP 02/26/2020 (Exact Date)   SpO2 100%   BMI 20.18 kg/m  Wt Readings from Last 3 Encounters:  03/09/23 121 lb 4 oz (55 kg)  01/04/23 118 lb 6.4 oz (53.7 kg)  12/08/22 127 lb (57.6 kg)    Physical Exam   Gen:  WDWN NAD HEENT: NCAT, conjunctiva not injected, sclera nonicteric EXT:  no edema MSK: gait-walks w/right foot turned out.  Some tenderness to palpation mult fibro points but not all.  MS-4-4+/5 all 4.  SLR-gets pain in lower legs.  Joints tender but no swelling.  NEURO: A&O x3.  CN II-XII intact.  Struggles to name animals. Doesn't know president. PSYCH: normal mood. Good eye contact Reviewd labs     Assessment & Plan:  Polyarthralgia -     Magnesium -     Vitamin B12 -     VITAMIN D 25 Hydroxy (Vit-D Deficiency, Fractures) -     Sedimentation rate -     C-reactive protein -     Rheumatoid factor  Memory difficulties -     Vitamin B12 -     Ambulatory referral to Neurology  Muscle cramp -     Magnesium -     Vitamin B12 -     VITAMIN D 25 Hydroxy (Vit-D Deficiency, Fractures) -     Sedimentation rate -     C-reactive protein -     Rheumatoid factor  1.  Polyarthralgias-ANA has been negative.  Not sure if this is part of fibromyalgia, some kind of neuropathy, spinal process, other.  Will check magnesium, B12, vitamin D, ESR, CRP, rheumatoid factor.  Advised to follow-up with sports medicine. 2.  Muscle cramps-previous labs were unremarkable.  Will check magnesium, B12, vitamin D, sed rate, CRP, rheumatoid.  Advised to do stretches, drink plenty of water.  Wear better supportive shoes.  Consider fibromyalgia. 3.  Memory difficulties-patient really seems to be struggling.  Not sure if this is due to her mental health, meds, early dementia, other.  She sees neurology for migraine.  Will refer for memory workup.  No follow-ups on file.  Angelena Sole, MD

## 2023-03-10 LAB — RHEUMATOID FACTOR: Rheumatoid fact SerPl-aCnc: <10 IU/mL (ref <14)

## 2023-03-13 NOTE — Progress Notes (Unsigned)
    Valerie Richards D.[facility] [address] [place] [address] Phone: [phone]   Assessment and Plan:     There are no diagnoses linked to this encounter.  ***   Pertinent previous records reviewed include ***   Follow Up: ***     Subjective:   I, Valerie Richards, am serving as a Neurosurgeon for Doctor Richardean Sale  Chief Complaint: pain all over   HPI:   03/14/2023 Patient is a 52 year old female complaining of pain all over. Patient states  Relevant Historical Information: ***  Additional pertinent review of systems negative.   Current Outpatient Medications:    amitriptyline (ELAVIL) 150 MG tablet, Take 1 tablet (150 mg total) by mouth at bedtime., Disp: 30 tablet, Rfl: 1   ARIPiprazole (ABILIFY) 5 MG tablet, Take 1 tablet (5 mg total) by mouth every morning., Disp: 30 tablet, Rfl: 1   Cholecalciferol (VITAMIN D3) 125 MCG (5000 UT) capsule, Take 1 capsule (5,000 Units total) by mouth daily., Disp: 90 capsule, Rfl: 1   clonazePAM (KLONOPIN) 0.5 MG tablet, Take 1 tablet (0.5 mg total) by mouth 2 (two) times daily., Disp: 60 tablet, Rfl: 1   estradiol (ESTRACE VAGINAL) 0.1 MG/GM vaginal cream, Place 1 g vaginally 2 (two) times a week. Initial dose: Nightly x 2 weeks, then twice weekly, Disp: 42.5 g, Rfl: 2   Fremanezumab-vfrm (AJOVY) 225 MG/1.5ML SOAJ, Inject 225 mg into the skin every 28 (twenty-eight) days., Disp: 1.68 mL, Rfl: 11   lamoTRIgine (LAMICTAL) 25 MG tablet, Take 1 tablet (25 mg total) by mouth daily., Disp: 30 tablet, Rfl: 1   LINZESS 290 MCG CAPS capsule, Take 1 capsule (290 mcg total) by mouth every morning., Disp: 30 capsule, Rfl: 5   Melatonin 10 MG CAPS, Take by mouth., Disp: , Rfl:    mirtazapine (REMERON) 15 MG tablet, Take 1 tab at night, Disp: , Rfl:    omeprazole (PRILOSEC) 20 MG capsule, Take 1 capsule (20 mg total) by mouth daily. START with 1 capsule bid for 2 weeks, then reduce to qd., Disp: 45 capsule, Rfl:  0   Rimegepant Sulfate (NURTEC) 75 MG TBDP, Take 1 tablet (75 mg total) by mouth as needed (Take one tablet as needed at the earlist onset of a Migraine.  Max 1 tablet in 24 hours)., Disp: 16 tablet, Rfl: 5   tiZANidine (ZANAFLEX) 4 MG tablet, Take 1 tablet (4 mg total) by mouth 3 (three) times daily as needed for muscle spasms., Disp: 90 tablet, Rfl: 5   triamcinolone cream (KENALOG) 0.1 %, Apply 1 Application topically 2 (two) times daily., Disp: 30 g, Rfl: 0   Objective:     There were no vitals filed for this visit.    There is no height or weight on file to calculate BMI.    Physical Exam:    ***   Electronically signed by:  Valerie Richards D.[facility] 12:25 PM 03/13/23

## 2023-03-14 ENCOUNTER — Ambulatory Visit (INDEPENDENT_AMBULATORY_CARE_PROVIDER_SITE_OTHER): Payer: BLUE CROSS/BLUE SHIELD | Admitting: Sports Medicine

## 2023-03-14 VITALS — BP 102/80 | HR 58 | Ht 65.0 in | Wt 121.0 lb

## 2023-03-14 DIAGNOSIS — M2550 Pain in unspecified joint: Secondary | ICD-10-CM | POA: Diagnosis not present

## 2023-03-14 MED ORDER — MELOXICAM 15 MG PO TABS: 15.0000 mg | ORAL_TABLET | Freq: Every day | ORAL | 0 refills | Status: DC

## 2023-03-14 NOTE — Patient Instructions (Signed)
-   Start meloxicam 15 mg daily x2 weeks.  If still having pain after 2 weeks, complete 3rd-week of meloxicam. May use remaining meloxicam as needed once daily for pain control.  Do not to use additional NSAIDs while taking meloxicam.  May use Tylenol [id number] mg 2 to 3 times a day for breakthrough pain. HEP shoulder , back, and knee 4 week follow up

## 2023-03-21 ENCOUNTER — Other Ambulatory Visit (HOSPITAL_COMMUNITY): Payer: Self-pay | Admitting: Psychiatry

## 2023-03-21 DIAGNOSIS — F323 Major depressive disorder, single episode, severe with psychotic features: Secondary | ICD-10-CM

## 2023-03-22 ENCOUNTER — Other Ambulatory Visit (HOSPITAL_COMMUNITY): Payer: Self-pay

## 2023-03-27 ENCOUNTER — Emergency Department (HOSPITAL_BASED_OUTPATIENT_CLINIC_OR_DEPARTMENT_OTHER)
Admission: EM | Admit: 2023-03-27 | Discharge: 2023-03-27 | Disposition: A | Discharge status: Home / Self Care | Payer: BLUE CROSS/BLUE SHIELD | Attending: Emergency Medicine | Admitting: Emergency Medicine

## 2023-03-27 ENCOUNTER — Other Ambulatory Visit: Payer: Self-pay

## 2023-03-27 ENCOUNTER — Emergency Department (HOSPITAL_BASED_OUTPATIENT_CLINIC_OR_DEPARTMENT_OTHER): Payer: BLUE CROSS/BLUE SHIELD

## 2023-03-27 ENCOUNTER — Encounter (HOSPITAL_BASED_OUTPATIENT_CLINIC_OR_DEPARTMENT_OTHER): Payer: Self-pay

## 2023-03-27 DIAGNOSIS — Z87891 Personal history of nicotine dependence: Secondary | ICD-10-CM | POA: Diagnosis not present

## 2023-03-27 DIAGNOSIS — Z8673 Personal history of transient ischemic attack (TIA), and cerebral infarction without residual deficits: Secondary | ICD-10-CM | POA: Insufficient documentation

## 2023-03-27 DIAGNOSIS — R0602 Shortness of breath: Secondary | ICD-10-CM | POA: Insufficient documentation

## 2023-03-27 DIAGNOSIS — R079 Chest pain, unspecified: Secondary | ICD-10-CM

## 2023-03-27 DIAGNOSIS — R072 Precordial pain: Secondary | ICD-10-CM | POA: Diagnosis not present

## 2023-03-27 LAB — CBC WITH DIFFERENTIAL/PLATELET
Abs Immature Granulocytes: 0 10*3/uL (ref 0.00–0.07)
Basophils Absolute: 0.1 10*3/uL (ref 0.0–0.1)
Basophils Relative: 2 %
Eosinophils Absolute: 0.1 10*3/uL (ref 0.0–0.5)
Eosinophils Relative: 2 %
HCT: 35.3 % — ABNORMAL LOW (ref 36.0–46.0)
Hemoglobin: 11.4 g/dL — ABNORMAL LOW (ref 12.0–15.0)
Immature Granulocytes: 0 %
Lymphocytes Relative: 43 %
Lymphs Abs: 2 10*3/uL (ref 0.7–4.0)
MCH: 30.3 pg (ref 26.0–34.0)
MCHC: 32.3 g/dL (ref 30.0–36.0)
MCV: 93.9 fL (ref 80.0–100.0)
Monocytes Absolute: 0.3 10*3/uL (ref 0.1–1.0)
Monocytes Relative: 7 %
Neutro Abs: 2.1 10*3/uL (ref 1.7–7.7)
Neutrophils Relative %: 46 %
Platelets: 263 10*3/uL (ref 150–400)
RBC: 3.76 MIL/uL — ABNORMAL LOW (ref 3.87–5.11)
RDW: 13.1 % (ref 11.5–15.5)
WBC: 4.5 10*3/uL (ref 4.0–10.5)
nRBC: 0 % (ref 0.0–0.2)

## 2023-03-27 LAB — COMPREHENSIVE METABOLIC PANEL
ALT: 17 U/L (ref 0–44)
AST: 20 U/L (ref 15–41)
Albumin: 3.9 g/dL (ref 3.5–5.0)
Alkaline Phosphatase: 59 U/L (ref 38–126)
Anion gap: 7 (ref 5–15)
BUN: 24 mg/dL — ABNORMAL HIGH (ref 6–20)
CO2: 26 mmol/L (ref 22–32)
Calcium: 9.1 mg/dL (ref 8.9–10.3)
Chloride: 106 mmol/L (ref 98–111)
Creatinine, Ser: 0.6 mg/dL (ref 0.44–1.00)
GFR, Estimated: >60 mL/min (ref >60)
Glucose, Bld: 98 mg/dL (ref 70–99)
Potassium: 3.6 mmol/L (ref 3.5–5.1)
Sodium: 139 mmol/L (ref 135–145)
Total Bilirubin: 0.8 mg/dL (ref 0.3–1.2)
Total Protein: 6.3 g/dL — ABNORMAL LOW (ref 6.5–8.1)

## 2023-03-27 LAB — D-DIMER, QUANTITATIVE: D-Dimer, Quant: <0.27 ug/mL-FEU (ref 0.00–0.50)

## 2023-03-27 LAB — TROPONIN I (HIGH SENSITIVITY)
Troponin I (High Sensitivity): 3 ng/L (ref <18)
Troponin I (High Sensitivity): 3 ng/L (ref <18)

## 2023-03-27 LAB — LIPASE, BLOOD: Lipase: 46 U/L (ref 11–51)

## 2023-03-27 MED ORDER — SODIUM CHLORIDE 0.9 % IV BOLUS
1000.0000 mL | Freq: Once | INTRAVENOUS | Status: AC
  Administered 2023-03-27: 1000 mL via INTRAVENOUS

## 2023-03-27 NOTE — ED Provider Notes (Signed)
[place] [facility] AT [facility] Provider Note   CSN: [id number] Arrival date & time: 03/27/23  1910     History  Chief Complaint  Patient presents with   Shortness of Breath    Valerie Richards is a 52 y.o. female.  Patient here for some generalized weakness, chest discomfort, shortness of breath.  History of anemia, anxiety, stroke.  She does not have any active symptoms.  Former smoker.  Denies any leg swelling.  No recent surgery or travel.  Nothing makes it worse or better.  Denies any cough, fever production,  The history is provided by the patient.       Home Medications Prior to Admission medications   Medication Sig Start Date End Date Taking? Authorizing Provider  amitriptyline (ELAVIL) 150 MG tablet Take 1 tablet (150 mg total) by mouth at bedtime. 03/01/23   Arfeen, Phillips Grout, MD  ARIPiprazole (ABILIFY) 5 MG tablet Take 1 tablet (5 mg total) by mouth every morning. 01/24/23   Arfeen, Phillips Grout, MD  Cholecalciferol (VITAMIN D3) 125 MCG (5000 UT) capsule Take 1 capsule (5,000 Units total) by mouth daily. 11/24/22   Dulce Sellar, NP  clonazePAM (KLONOPIN) 0.5 MG tablet Take 1 tablet (0.5 mg total) by mouth 2 (two) times daily. 03/01/23 04/30/23  Arfeen, Phillips Grout, MD  estradiol (ESTRACE VAGINAL) 0.1 MG/GM vaginal cream Place 1 g vaginally 2 (two) times a week. Initial dose: Nightly x 2 weeks, then twice weekly 11/17/22   Wyline Beady A, NP  Fremanezumab-vfrm (AJOVY) 225 MG/1.5ML SOAJ Inject 225 mg into the skin every 28 (twenty-eight) days. 01/04/23   Drema Dallas, DO  lamoTRIgine (LAMICTAL) 25 MG tablet Take 1 tablet (25 mg total) by mouth daily. 03/01/23 04/30/23  Arfeen, Phillips Grout, MD  LINZESS 290 MCG CAPS capsule Take 1 capsule (290 mcg total) by mouth every morning. 11/14/22   Dulce Sellar, NP  Melatonin 10 MG CAPS Take by mouth. 09/19/19   [provider]  meloxicam (MOBIC) 15 MG tablet Take 1 tablet (15 mg total) by mouth daily. 03/14/23    Richardean Sale, DO  mirtazapine (REMERON) 15 MG tablet Take 1 tab at night 06/09/20   [provider]  omeprazole (PRILOSEC) 20 MG capsule Take 1 capsule (20 mg total) by mouth daily. START with 1 capsule bid for 2 weeks, then reduce to qd. 11/23/22   Dulce Sellar, NP  Rimegepant Sulfate (NURTEC) 75 MG TBDP Take 1 tablet (75 mg total) by mouth as needed (Take one tablet as needed at the earlist onset of a Migraine.  Max 1 tablet in 24 hours). 01/04/23   Drema Dallas, DO  tiZANidine (ZANAFLEX) 4 MG tablet Take 1 tablet (4 mg total) by mouth 3 (three) times daily as needed for muscle spasms. 01/04/23   Drema Dallas, DO  triamcinolone cream (KENALOG) 0.1 % Apply 1 Application topically 2 (two) times daily. 02/15/23   Olivia Mackie, NP      Allergies    Patient has no known allergies.    Review of Systems   Review of Systems  Physical Exam Updated Vital Signs BP 118/60 (BP Location: Right Arm)   Pulse 63   Temp 98.6 F (37 C)   Resp 18   Ht 5\' 5"  (1.651 m)   Wt 54.9 kg   LMP 02/26/2020 (Exact Date)   SpO2 99%   BMI 20.14 kg/m  Physical Exam Vitals and nursing note reviewed.  Constitutional:  General: She is not in acute distress.    Appearance: She is well-developed.  HENT:     Head: Normocephalic and atraumatic.     Mouth/Throat:     Mouth: Mucous membranes are moist.  Eyes:     Extraocular Movements: Extraocular movements intact.     Conjunctiva/sclera: Conjunctivae normal.     Pupils: Pupils are equal, round, and reactive to light.  Cardiovascular:     Rate and Rhythm: Normal rate and regular rhythm.     Pulses: Normal pulses.     Heart sounds: Normal heart sounds. No murmur heard. Pulmonary:     Effort: Pulmonary effort is normal. No respiratory distress.     Breath sounds: Normal breath sounds. No decreased breath sounds, wheezing or rhonchi.  Abdominal:     Palpations: Abdomen is soft.     Tenderness: There is no abdominal tenderness.   Musculoskeletal:        General: No swelling. Normal range of motion.     Cervical back: Normal range of motion and neck supple.     Right lower leg: No edema.     Left lower leg: No edema.  Skin:    General: Skin is warm and dry.     Capillary Refill: Capillary refill takes less than 2 seconds.  Neurological:     General: No focal deficit present.     Mental Status: She is alert.  Psychiatric:        Mood and Affect: Mood normal.     ED Results / Procedures / Treatments   Labs (all labs ordered are listed, but only abnormal results are displayed) Labs Reviewed  CBC WITH DIFFERENTIAL/PLATELET - Abnormal; Notable for the following components:      Result Value   RBC 3.76 (*)    Hemoglobin 11.4 (*)    HCT 35.3 (*)    All other components within normal limits  COMPREHENSIVE METABOLIC PANEL - Abnormal; Notable for the following components:   BUN 24 (*)    Total Protein 6.3 (*)    All other components within normal limits  LIPASE, BLOOD  D-DIMER, QUANTITATIVE  TROPONIN I (HIGH SENSITIVITY)  TROPONIN I (HIGH SENSITIVITY)    EKG None  Radiology DG Chest Portable 1 View  Result Date: 03/27/2023 CLINICAL DATA:  Shortness of breath EXAM: PORTABLE CHEST 1 VIEW COMPARISON:  Chest x-ray 11/10/2016 FINDINGS: The heart size and mediastinal contours are within normal limits. Both lungs are clear. Bilateral nipple shadows are present. The visualized skeletal structures are unremarkable. IMPRESSION: No active disease. Electronically Signed   By: Darliss Cheney M.D.   On: 03/27/2023 20:41    Procedures Procedures    Medications Ordered in ED Medications  sodium chloride 0.9 % bolus 1,000 mL (1,000 mLs Intravenous New Bag/Given 03/27/23 2223)    ED Course/ Medical Decision Making/ A&P                             Medical Decision Making Amount and/or Complexity of Data Reviewed Labs: ordered. Radiology: ordered.   Valerie Richards is here with chest pain and shortness of  breath.  Fairly asymptomatic now.  Normal vitals.  No fever.  EKG shows sinus rhythm.  No ischemic changes.  Nonspecific story.  Seems like more generalized weakness.  Neurologically she is intact.  Differential diagnosis likely musculoskeletal process.  She does have history of stroke.  Differential diagnosis to include ACS, PE, anemia, electrolyte abnormality.  Per my review interpretation of chest x-ray there is no pneumonia.  D-dimer is normal.  Troponin negative x 2.  No significant electrolyte abnormality, anemia, leukocytosis.  Feeling better after IV fluids.  Will have her follow-up with cardiology but overall have low concern for acute cardiac or pulmonary process.  Recommend follow-up primary care doctor.  Discharged in good condition.  This chart was dictated using voice recognition software.  Despite best efforts to proofread,  errors can occur which can change the documentation meaning.         Final Clinical Impression(s) / ED Diagnoses Final diagnoses:  Shortness of breath  Chest pain, unspecified type    Rx / DC Orders ED Discharge Orders          Ordered    Ambulatory referral to Cardiology       Comments: If you have not heard from the Cardiology office within the next 72 hours please call [phone].   03/27/23 2252              Virgina Norfolk, DO 03/27/23 2255

## 2023-03-27 NOTE — ED Notes (Signed)
Light green, lavender, and blue top sent to lab

## 2023-03-27 NOTE — ED Triage Notes (Signed)
Pt arrives with c/o SOB that started a few days ago. Pt endorses generalized weakness and CP.

## 2023-04-06 ENCOUNTER — Telehealth: Payer: Self-pay

## 2023-04-06 NOTE — Telephone Encounter (Signed)
How frequent or the headaches (on average, how many days a week/month are they occurring)?  Comes and goes sharp pain on the top or the right side if head and over the eye.  How long do the headaches last?  Couple of minutes and then goes a way and comes back Verify what preventative medication and dose you are taking (e.g. topiramate, propranolol, amitriptyline, Emgality, etc)  Stop the Ajovy, Oxcarbazepine.  Verify which rescue medication you are taking (triptan, Advil, Excedrin, Aleve, Ubrelvy, etc)  Ibuprofen the highest dose( Unsure of the dose.) Nurtec denied on 02/10/23. Patient advised to stp by and pick up urbelvy and never did.  How often are you taking pain relievers/analgesics/rescue mediction?  Everyday since the pain started.

## 2023-04-07 NOTE — Telephone Encounter (Signed)
Patient was advised to call the office back with her medication she has at home and are taking how often.

## 2023-04-10 ENCOUNTER — Other Ambulatory Visit: Payer: Self-pay | Admitting: Sports Medicine

## 2023-04-10 NOTE — Progress Notes (Signed)
Referring-Adam Curatolo DO Reason for referral-chest pain  HPI: 52 year old female for evaluation of chest pain at request of Virgina Norfolk DO.  Seen in the emergency room March 27, 2023 with chest pain and dyspnea.  Troponins were normal as well as D-dimer.  Lipase 46, creatinine 0.60, normal liver functions, hemoglobin 11.4 with MCV 93.9.  Chest x-ray without acute infiltrates.  Cardiology now asked to evaluate.  Patient states she has had problems with chest pain since her husband died 6 years ago.  The pain is in the left upper chest/shoulder and is associated with pain in her left lower extremity.  She also has pain in other parts of her body.  It is not clearly exertional.  Lasts minutes and resolves.  She also complains of fatigue and depression.  She also notes dyspnea both with exertion and at rest.  She states she occasionally has pedal edema.  She has not had syncope.  Current Outpatient Medications  Medication Sig Dispense Refill   amitriptyline (ELAVIL) 150 MG tablet Take 1 tablet (150 mg total) by mouth at bedtime. 30 tablet 1   ARIPiprazole (ABILIFY) 5 MG tablet Take 1 tablet (5 mg total) by mouth every morning. 30 tablet 1   Cholecalciferol (VITAMIN D3) 125 MCG (5000 UT) capsule Take 1 capsule (5,000 Units total) by mouth daily. 90 capsule 1   clonazePAM (KLONOPIN) 0.5 MG tablet Take 1 tablet (0.5 mg total) by mouth 2 (two) times daily. 60 tablet 1   estradiol (ESTRACE VAGINAL) 0.1 MG/GM vaginal cream Place 1 g vaginally 2 (two) times a week. Initial dose: Nightly x 2 weeks, then twice weekly 42.5 g 2   Fremanezumab-vfrm (AJOVY) 225 MG/1.5ML SOAJ Inject 225 mg into the skin every 28 (twenty-eight) days. 1.68 mL 11   lamoTRIgine (LAMICTAL) 25 MG tablet Take 1 tablet (25 mg total) by mouth daily. 30 tablet 1   LINZESS 290 MCG CAPS capsule Take 1 capsule (290 mcg total) by mouth every morning. 30 capsule 5   Melatonin 10 MG CAPS Take by mouth.     meloxicam (MOBIC) 15 MG tablet  Take 1 tablet (15 mg total) by mouth daily. 30 tablet 0   mirtazapine (REMERON) 15 MG tablet Take 1 tab at night     omeprazole (PRILOSEC) 20 MG capsule Take 1 capsule (20 mg total) by mouth daily. START with 1 capsule bid for 2 weeks, then reduce to qd. 45 capsule 0   Rimegepant Sulfate (NURTEC) 75 MG TBDP Take 1 tablet (75 mg total) by mouth as needed (Take one tablet as needed at the earlist onset of a Migraine.  Max 1 tablet in 24 hours). 16 tablet 5   tiZANidine (ZANAFLEX) 4 MG tablet Take 1 tablet (4 mg total) by mouth 3 (three) times daily as needed for muscle spasms. 90 tablet 5   triamcinolone cream (KENALOG) 0.1 % Apply 1 Application topically 2 (two) times daily. 30 g 0   No current facility-administered medications for this visit.    No Known Allergies   Past Medical History:  Diagnosis Date   Acute recurrent ethmoidal sinusitis 09/03/2018   Anemia    iron deficiency   Anxiety    Depression    Fibroid    Menorrhagia 01/20/2021   Migraine    Nasal pain 09/03/2018   Stroke (HCC) 12/2020   See MRI.   Vaginal discharge 01/20/2021   Vaginitis and vulvovaginitis 01/20/2021    Past Surgical History:  Procedure Laterality Date  EYE SURGERY Bilateral    TONSILLECTOMY      Social History   Socioeconomic History   Marital status: Widowed    Spouse name: Not on file   Number of children: 5   Years of education: Not on file   Highest education level: Not on file  Occupational History   Occupation: Conservation officer, nature    Comment: Surveyor, minerals  Tobacco Use   Smoking status: Former    Packs/day: 0.25    Years: 8.00    Additional pack years: 0.00    Total pack years: 2.00    Types: Cigarettes    Quit date: 10/10/2012    Years since quitting: 10.5   Smokeless tobacco: Never  Vaping Use   Vaping Use: Never used  Substance and Sexual Activity   Alcohol use: No    Alcohol/week: 0.0 standard drinks of alcohol   Drug use: No   Sexual activity: Not Currently     Partners: Male    Birth control/protection: Post-menopausal  Other Topics Concern   Not on file  Social History Narrative   Widowed 2015/11/30. Husband died from [place]'s disease.    Right handed drinks caffeine   2 story home   [employer]   Social Determinants of Health   Financial Resource Strain: Not on file  Food Insecurity: Not on file  Transportation Needs: Not on file  Physical Activity: Not on file  Stress: Not on file  Social Connections: Not on file  Intimate Partner Violence: Not on file    Family History  Problem Relation Age of Onset   Hypertension Mother    Cancer Father    Diabetes Father    Colon cancer Father    Kidney failure Father    Diabetes Brother     ROS: Depression and anxiety but no fevers or chills, productive cough, hemoptysis, dysphasia, odynophagia, melena, hematochezia, dysuria, hematuria, rash, seizure activity, orthopnea, PND, pedal edema, claudication. Remaining systems are negative.  Physical Exam:   Blood pressure (!) 98/58, pulse 64, height 5\' 5"  (1.651 m), weight 122 lb (55.3 kg), last menstrual period 02/26/2020, SpO2 99 %.  General:  Well developed/well nourished in NAD Skin warm/dry Patient not depressed No peripheral clubbing Back-normal HEENT-normal/normal eyelids Neck supple/normal carotid upstroke bilaterally; no bruits; no JVD; no thyromegaly chest - CTA/ normal expansion CV - RRR/normal S1 and S2; no murmurs, rubs or gallops;  PMI nondisplaced Abdomen -NT/ND, no HSM, no mass, + bowel sounds, no bruit 2+ femoral pulses, no bruits Ext-no edema, chords, 2+ DP Neuro-grossly nonfocal  ECG - March 27, 2023-normal sinus rhythm with no ST changes. personally reviewed  EKG Interpretation Date/Time:  Wednesday April 19 2023 10:49:30 EDT Ventricular Rate:  64 PR Interval:  170 QRS Duration:  68 QT Interval:  450 QTC Calculation: 464 R Axis:   84  Text Interpretation: Normal sinus rhythm Normal ECG When compared with ECG  of 27-Mar-2023 19:17, PREVIOUS ECG IS PRESENT Confirmed by Olga Millers ([id number]) on 04/19/2023 10:59:24 AM    A/P  1 chest pain-symptoms are extremely atypical.  There is associated pain in her left lower extremity.  Electrocardiogram shows no ST changes.  No plans for further ischemia evaluation.  Question secondary to fibromyalgia and depression.  2 dyspnea-etiology unclear.  She is not volume overloaded on examination.  Will arrange an echocardiogram to assess LV function.  If normal we will not pursue further evaluation.  Olga Millers, MD

## 2023-04-19 ENCOUNTER — Ambulatory Visit: Payer: BLUE CROSS/BLUE SHIELD | Attending: Cardiology | Admitting: Cardiology

## 2023-04-19 ENCOUNTER — Encounter: Payer: Self-pay | Admitting: Cardiology

## 2023-04-19 VITALS — BP 98/58 | HR 64 | Ht 65.0 in | Wt 122.0 lb

## 2023-04-19 DIAGNOSIS — R0602 Shortness of breath: Secondary | ICD-10-CM | POA: Diagnosis not present

## 2023-04-19 DIAGNOSIS — R072 Precordial pain: Secondary | ICD-10-CM | POA: Diagnosis not present

## 2023-04-19 NOTE — Patient Instructions (Signed)
    Testing/Procedures:  Your physician has requested that you have an echocardiogram. Echocardiography is a painless test that uses sound waves to create images of your heart. It provides your doctor with information about the size and shape of your heart and how well your heart's chambers and valves are working. This procedure takes approximately one hour. There are no restrictions for this procedure. Please do NOT wear cologne, perfume, aftershave, or lotions (deodorant is allowed). Please arrive 15 minutes prior to your appointment time. [facility] - 1ST FLOOR   Follow-Up: At [facility], you and your health needs are our priority.  As part of our continuing mission to provide you with exceptional heart care, we have created designated Provider Care Teams.  These Care Teams include your primary Cardiologist (physician) and Advanced Practice Providers (APPs -  Physician Assistants and Nurse Practitioners) who all work together to provide you with the care you need, when you need it.  We recommend signing up for the patient portal called "MyChart".  Sign up information is provided on this After Visit Summary.  MyChart is used to connect with patients for Virtual Visits (Telemedicine).  Patients are able to view lab/test results, encounter notes, upcoming appointments, etc.  Non-urgent messages can be sent to your provider as well.   To learn more about what you can do with MyChart, go to [email or web address].    Your next appointment:   AS NEEDED 

## 2023-04-20 ENCOUNTER — Other Ambulatory Visit (HOSPITAL_COMMUNITY): Payer: Self-pay | Admitting: Psychiatry

## 2023-04-20 DIAGNOSIS — F323 Major depressive disorder, single episode, severe with psychotic features: Secondary | ICD-10-CM

## 2023-04-20 DIAGNOSIS — F419 Anxiety disorder, unspecified: Secondary | ICD-10-CM

## 2023-04-25 ENCOUNTER — Telehealth: Payer: Self-pay | Admitting: *Deleted

## 2023-04-25 NOTE — Telephone Encounter (Signed)
-----   Message from [place] B sent at 04/25/2023  7:05 AM EDT ----- Regarding: [employer] Out Of Network Good Morning,   Hope all is well; this patient is scheduled for an echocardiogram on 05/11/2023. [employer] will not authorize the echocardiogram request due to Dr. Jens Som and [place]'s facilities being out of network for the patient's policy. If the patient decides to move forward with the exam, they will be considered self-pay (will not qualify for the self-pay discount) and the insurance will not be filed. The patient will be responsible for the cost of the exam.   Thank you,   Ihor Gully

## 2023-04-25 NOTE — Telephone Encounter (Signed)
Spoke with pt, aware the echo will not be covered here. Aware she needs to call insurance and find out where they would let her have the echo. Aware 05/11/23 appointment has been cancelled.

## 2023-04-30 ENCOUNTER — Other Ambulatory Visit (HOSPITAL_COMMUNITY): Payer: Self-pay | Admitting: Psychiatry

## 2023-04-30 DIAGNOSIS — F323 Major depressive disorder, single episode, severe with psychotic features: Secondary | ICD-10-CM

## 2023-04-30 DIAGNOSIS — F4312 Post-traumatic stress disorder, chronic: Secondary | ICD-10-CM

## 2023-05-08 ENCOUNTER — Telehealth: Payer: Self-pay | Admitting: Family

## 2023-05-08 ENCOUNTER — Encounter (HOSPITAL_COMMUNITY): Payer: Self-pay | Admitting: Psychiatry

## 2023-05-08 ENCOUNTER — Telehealth: Payer: Self-pay | Admitting: Cardiology

## 2023-05-08 ENCOUNTER — Telehealth (HOSPITAL_BASED_OUTPATIENT_CLINIC_OR_DEPARTMENT_OTHER): Payer: BLUE CROSS/BLUE SHIELD | Admitting: Psychiatry

## 2023-05-08 VITALS — Wt 122.0 lb

## 2023-05-08 DIAGNOSIS — F323 Major depressive disorder, single episode, severe with psychotic features: Secondary | ICD-10-CM

## 2023-05-08 DIAGNOSIS — F410 Panic disorder [episodic paroxysmal anxiety] without agoraphobia: Secondary | ICD-10-CM

## 2023-05-08 DIAGNOSIS — F4312 Post-traumatic stress disorder, chronic: Secondary | ICD-10-CM

## 2023-05-08 MED ORDER — LAMOTRIGINE 25 MG PO TABS
25.0000 mg | ORAL_TABLET | Freq: Every day | ORAL | 2 refills | Status: DC
Start: 2023-05-08 — End: 2023-08-10

## 2023-05-08 MED ORDER — AMITRIPTYLINE HCL 150 MG PO TABS: 150.0000 mg | ORAL_TABLET | Freq: Every day | ORAL | 2 refills | Status: DC

## 2023-05-08 MED ORDER — CLONAZEPAM 0.5 MG PO TABS
0.5000 mg | ORAL_TABLET | Freq: Two times a day (BID) | ORAL | 2 refills | Status: DC
Start: 2023-05-08 — End: 2023-08-08

## 2023-05-08 NOTE — Telephone Encounter (Signed)
**Note Valerie-Identified via Obfuscation**    The patient mentioned that she has Valerie Richards's and believes it should cover her echocardiogram. She expressed that she hasn't been feeling well and needs to undergo this test. She provided her [place] Medicaid information for verification: [facility] medicaid  [facility] community [id number] M  10/10/2016 to present

## 2023-05-08 NOTE — Telephone Encounter (Signed)
FYI: This call has been transferred to triage nurse: the Triage Nurse. Once the result note has been entered staff can address the message at that time.  Patient called in with the following symptoms:  Red Word:dizziness -fatigue, fell 3 days ago from dizziness-bruised legs. After eating is unable to her body move at all. Also, difficulty breathing. Symptoms began approx. 1 month ago   Please advise at Mobile [phone] (mobile)  Message is routed to Provider Pool.

## 2023-05-08 NOTE — Telephone Encounter (Signed)
Will route this call to scheduling to get pt scheduled for the EKG. Called pt to let her know to expect a call.

## 2023-05-08 NOTE — Progress Notes (Addendum)
[facility] MD Virtual Progress Note   Patient Location: Home Provider Location: Home Office  I connect with patient by telephone and verified that I am speaking with correct person by using two identifiers. I discussed the limitations of evaluation and management by telemedicine and the availability of in person appointments. I also discussed with the patient that there may be a patient responsible charge related to this service. The patient expressed understanding and agreed to proceed.  Valerie Richards [id number] 52 y.o.  05/08/2023 2:25 PM  History of Present Illness:  Patient is evaluated by phone session.  Her camera is not working and cannot do video session.  She recently seen in the emergency room because of the chest pain.  She also saw cardiology and she was told everything is good.  She reported a lot of somatic complaints.  Recently she noticed after the meal she feels very tired and she feels her body shut down.  She does not move.  She is not sure why it is happening.  She has appointment with the primary care coming up soon.  She again confused about medicine and started taking Cymbalta and Abilify which was discontinued 3 months ago.  When I asked why she started taking the medicine she apologized and told that sometimes she does not remember.  She admitted therapy had met this counseling but again not consistent and not regular.  She has appointment coming up in few days.  She is taking Lamictal 25 mg daily and so far she has no major issues with the medication.  She is also taking amitriptyline and Klonopin.  She has a lot of anxiety about her general health.  She has headaches, memory issues, stomach issues.  She does think about her family members who live in [place].  However she denies any recent crying spells or any feeling of hopelessness or suicidal thoughts.  Her weight is stable.  She has no rash or any itching.  She does feel nervous and anxious around  people.  She is sleeping 4 to 6 hours but not every day.  She does have nightmares and flashback.  Past Psychiatric History: H/O depression, anxiety, childhood trauma and panic attack.  Saw provider few years ago at [facility] and prescribed Lexapro, Prozac, Effexor, mirtazapine but did not felt it helped.  Prescribed Zoloft, Ambien and hydroxyzine by PCP.  We tried Cymbalta and Abilify. She was recommended inpatient years ago but did not stay in the hospital.    Outpatient Encounter Medications as of 05/08/2023  Medication Sig   amitriptyline (ELAVIL) 150 MG tablet Take 1 tablet (150 mg total) by mouth at bedtime.   ARIPiprazole (ABILIFY) 5 MG tablet Take 1 tablet (5 mg total) by mouth every morning.   Cholecalciferol (VITAMIN D3) 125 MCG (5000 UT) capsule Take 1 capsule (5,000 Units total) by mouth daily.   clonazePAM (KLONOPIN) 0.5 MG tablet Take 1 tablet (0.5 mg total) by mouth 2 (two) times daily.   estradiol (ESTRACE VAGINAL) 0.1 MG/GM vaginal cream Place 1 g vaginally 2 (two) times a week. Initial dose: Nightly x 2 weeks, then twice weekly   Fremanezumab-vfrm (AJOVY) 225 MG/1.5ML SOAJ Inject 225 mg into the skin every 28 (twenty-eight) days.   lamoTRIgine (LAMICTAL) 25 MG tablet Take 1 tablet (25 mg total) by mouth daily.   LINZESS 290 MCG CAPS capsule Take 1 capsule (290 mcg total) by mouth every morning.   Melatonin 10 MG CAPS Take by mouth.   meloxicam (  MOBIC) 15 MG tablet Take 1 tablet (15 mg total) by mouth daily.   mirtazapine (REMERON) 15 MG tablet Take 1 tab at night   omeprazole (PRILOSEC) 20 MG capsule Take 1 capsule (20 mg total) by mouth daily. START with 1 capsule bid for 2 weeks, then reduce to qd.   Rimegepant Sulfate (NURTEC) 75 MG TBDP Take 1 tablet (75 mg total) by mouth as needed (Take one tablet as needed at the earlist onset of a Migraine.  Max 1 tablet in 24 hours).   tiZANidine (ZANAFLEX) 4 MG tablet Take 1 tablet (4 mg total) by mouth 3 (three) times daily as  needed for muscle spasms.   triamcinolone cream (KENALOG) 0.1 % Apply 1 Application topically 2 (two) times daily.   No facility-administered encounter medications on file as of 05/08/2023.    Recent Results (from the past 2160 hour(s))  Magnesium     Status: None   Collection Time: 03/09/23 11:18 AM  Result Value Ref Range   Magnesium 2.0 1.5 - 2.5 mg/dL  Vitamin D63     Status: None   Collection Time: 03/09/23 11:18 AM  Result Value Ref Range   Vitamin B-12 422 211 - 911 pg/mL  VITAMIN D 25 Hydroxy (Vit-D Deficiency, Fractures)     Status: None   Collection Time: 03/09/23 11:18 AM  Result Value Ref Range   VITD 44.72 30.00 - 100.00 ng/mL  Sedimentation rate     Status: None   Collection Time: 03/09/23 11:18 AM  Result Value Ref Range   Sed Rate 3 0 - 30 mm/hr  C-reactive protein     Status: None   Collection Time: 03/09/23 11:18 AM  Result Value Ref Range   CRP <1.0 0.5 - 20.0 mg/dL  Rheumatoid factor     Status: None   Collection Time: 03/09/23 11:18 AM  Result Value Ref Range   Rheumatoid fact SerPl-aCnc <10 <14 IU/mL  CBC with Differential     Status: Abnormal   Collection Time: 03/27/23  7:32 PM  Result Value Ref Range   WBC 4.5 4.0 - 10.5 K/uL   RBC 3.76 (L) 3.87 - 5.11 MIL/uL   Hemoglobin 11.4 (L) 12.0 - 15.0 g/dL   HCT 87.5 (L) 64.3 - 32.9 %   MCV 93.9 80.0 - 100.0 fL   MCH 30.3 26.0 - 34.0 pg   MCHC 32.3 30.0 - 36.0 g/dL   RDW 51.8 84.1 - 66.0 %   Platelets 263 150 - 400 K/uL   nRBC 0.0 0.0 - 0.2 %   Neutrophils Relative % 46 %   Neutro Abs 2.1 1.7 - 7.7 K/uL   Lymphocytes Relative 43 %   Lymphs Abs 2.0 0.7 - 4.0 K/uL   Monocytes Relative 7 %   Monocytes Absolute 0.3 0.1 - 1.0 K/uL   Eosinophils Relative 2 %   Eosinophils Absolute 0.1 0.0 - 0.5 K/uL   Basophils Relative 2 %   Basophils Absolute 0.1 0.0 - 0.1 K/uL   Immature Granulocytes 0 %   Abs Immature Granulocytes 0.00 0.00 - 0.07 K/uL    Comment: Performed at [facility], [address] [place], [place] [address]  Comprehensive metabolic panel     Status: Abnormal   Collection Time: 03/27/23  7:32 PM  Result Value Ref Range   Sodium 139 135 - 145 mmol/L   Potassium 3.6 3.5 - 5.1 mmol/L   Chloride 106 98 - 111 mmol/L   CO2 26 22 -  32 mmol/L   Glucose, Bld 98 70 - 99 mg/dL    Comment: Glucose reference range applies only to samples taken after fasting for at least 8 hours.   BUN 24 (H) 6 - 20 mg/dL   Creatinine, Ser 4.54 0.44 - 1.00 mg/dL   Calcium 9.1 8.9 - 09.8 mg/dL   Total Protein 6.3 (L) 6.5 - 8.1 g/dL   Albumin 3.9 3.5 - 5.0 g/dL   AST 20 15 - 41 U/L   ALT 17 0 - 44 U/L   Alkaline Phosphatase 59 38 - 126 U/L   Total Bilirubin 0.8 0.3 - 1.2 mg/dL   GFR, Estimated >11 >91 mL/min    Comment: (NOTE) Calculated using the CKD-EPI Creatinine Equation (2021)    Anion gap 7 5 - 15    Comment: Performed at [facility], [address] [place], [place] [address]  Lipase, blood     Status: None   Collection Time: 03/27/23  7:32 PM  Result Value Ref Range   Lipase 46 11 - 51 U/L    Comment: Performed at [facility], [address] [place], [place] [address]  Troponin I (High Sensitivity)     Status: None   Collection Time: 03/27/23  7:32 PM  Result Value Ref Range   Troponin I (High Sensitivity) 3 <18 ng/L    Comment: (NOTE) Elevated high sensitivity troponin I (hsTnI) values and significant  changes across serial measurements may suggest ACS but many other  chronic and acute conditions are known to elevate hsTnI results.  Refer to the "Links" section for chest pain algorithms and additional  guidance. Performed at [facility], [address] [place], [place] [address]   D-dimer, quantitative     Status: None   Collection Time: 03/27/23  9:07 PM  Result Value Ref Range   D-Dimer, Quant <0.27 0.00 - 0.50 ug/mL-FEU    Comment: (NOTE) At the manufacturer cut-off value of 0.5 g/mL FEU, this assay has a negative  predictive value of 95-100%.This assay is intended for use in conjunction with a clinical pretest probability (PTP) assessment model to exclude pulmonary embolism (PE) and deep venous thrombosis (DVT) in outpatients suspected of PE or DVT. Results should be correlated with clinical presentation. Performed at [facility], [address] [place], [place] [address]   Troponin I (High Sensitivity)     Status: None   Collection Time: 03/27/23  9:07 PM  Result Value Ref Range   Troponin I (High Sensitivity) 3 <18 ng/L    Comment: (NOTE) Elevated high sensitivity troponin I (hsTnI) values and significant  changes across serial measurements may suggest ACS but many other  chronic and acute conditions are known to elevate hsTnI results.  Refer to the "Links" section for chest pain algorithms and additional  guidance. Performed at [facility], [address] [place], [place] [address]      Psychiatric Specialty Exam: Physical Exam  Review of Systems  Neurological:  Positive for headaches.  Psychiatric/Behavioral:  Positive for dysphoric mood. The patient is nervous/anxious.     Weight 122 lb (55.3 kg), last menstrual period 02/26/2020.There is no height or weight on file to calculate BMI.  General Appearance: NA  Eye Contact:  NA  Speech:  Slow  Volume:  Decreased  Mood:  Anxious and Dysphoric  Affect:  NA  Thought Process:  Descriptions of Associations: Intact  Orientation:  Full (Time,  Place, and Person)  Thought Content:  Rumination  Suicidal Thoughts:  No  Homicidal Thoughts:  No  Memory:  Immediate;   Fair Recent;   Fair Remote;   Fair  Judgement:  Fair  Insight:  Shallow  Psychomotor Activity:  NA  Concentration:  Concentration: Fair and Attention Span: Fair  Recall:  [employer] of Knowledge:  Fair  Language:  Good  Akathisia:  No  Handed:  Right  AIMS (if indicated):     Assets:  Communication Skills Desire for  Improvement Housing Social Support  ADL's:  Intact  Cognition:  WNL  Sleep:  ok     Assessment/Plan: Severe major depression with psychotic features (HCC) - Plan: clonazePAM (KLONOPIN) 0.5 MG tablet, lamoTRIgine (LAMICTAL) 25 MG tablet, amitriptyline (ELAVIL) 150 MG tablet  Chronic post-traumatic stress disorder (PTSD) - Plan: clonazePAM (KLONOPIN) 0.5 MG tablet, lamoTRIgine (LAMICTAL) 25 MG tablet  Panic attacks - Plan: clonazePAM (KLONOPIN) 0.5 MG tablet  Review recent emergency room visit and blood work results.  Recommend not to take Abilify and Cymbalta since it was discontinued a few months ago.  Patient also mentioned that she took Zyban from her friend who takes for depression and that did help.  I recommend not to take other people medication.  Encouraged to keep the medicine which prescribed.  So far mood is stable.  She has chronic anxiety and I strongly encouraged keep the appointment with therapist and take the medicine as prescribed.  Since her video does not work we will ask her to have an in person visit in 3 months.  Continue Klonopin 0.5 mg twice a day, Lamictal 25 mg daily and amitriptyline 150 mg daily.  Recommended to call us back if she has any question or any concern.  Follow-up in 3 months   Follow Up Instructions:     I discussed the assessment and treatment plan with the patient. The patient was provided an opportunity to ask questions and all were answered. The patient agreed with the plan and demonstrated an understanding of the instructions.   The patient was advised to call back or seek an in-person evaluation if the symptoms worsen or if the condition fails to improve as anticipated.    Collaboration of Care: Other provider involved in patient's care AEB notes are available in epic to review.  Patient/Guardian was advised Release of Information must be obtained prior to any record release in order to collaborate their care with an outside provider.  Patient/Guardian was advised if they have not already done so to contact the registration department to sign all necessary forms in order for [place] to release information regarding their care.   Consent: Patient/Guardian gives verbal consent for treatment and assignment of benefits for services provided during this visit. Patient/Guardian expressed understanding and agreed to proceed.     I provided 28 minutes of non face to face time during this encounter.  Note: This document was prepared by [employer] voice dictation technology and any errors that results from this process are unintentional.    Cleotis Nipper, MD 05/08/2023

## 2023-05-09 ENCOUNTER — Ambulatory Visit: Payer: BLUE CROSS/BLUE SHIELD | Admitting: Internal Medicine

## 2023-05-09 NOTE — Telephone Encounter (Signed)
Will forward the medicaid information to billing to get echocardiogram approved so it can be scheduled.

## 2023-05-09 NOTE — Telephone Encounter (Signed)
Patient Advised:  Go to ED Now  Patient Name First: Valerie Last: Richards Gender: Female DOB: Apr 22, 1971 Age: 52 Y 5 M 11 D Return Phone Number: [phone] (Primary) Address: City/ State/ Zip: [place]  [address] Client [facility] - Administrator, sports at [facility] Provider Tana Conch- MD Contact Type Call Who Is Calling Patient / Member / Family / Caregiver Call Type Triage / Clinical Caller Name Bonita Quin from [employer] [facility] [employer] Relationship To Patient Provider Return Phone Number [phone] (Primary) Chief Complaint BREATHING - fast, heavy or wheezing Reason for Call Symptomatic / Request for Health Information Initial Comment Bonita Quin from [facility] has a patient on the line with dizziness and fatigue. She fell three days ago from dizziness and bruised her leg. She is always dizzy and tired. She has difficulty breathing currently. When she eats she is not able to move her body because she feels really tired. This has been going on for a month. She went to the ED a few weeks ago but they did not know about the eating issue but she thinks they knew about the breathing. Patient came on the line and confirmed that all of the above is accurate and that she is currently having trouble breathing. Translation No Nurse Assessment Nurse: Tennis Ship, RN, Clarisse Gouge Date/Time (Eastern Time): 05/08/2023 3:34:39 PM Confirm and document reason for call. If symptomatic, describe symptoms. ---Caller states doesn't feel well, went to ED a few weeks ago, feels weak and tired when trying to do things. And also has knee pain. States has to push hard to breathe, ED said everything was fine, currently needs an echo, waiting on insurance, cardiology didn't find anything wrong. Does the patient have any new or worsening symptoms? ---Yes Will a triage be completed? ---Yes Related visit to physician  within the last 2 weeks? ---Yes Does the PT have any chronic conditions? (i.e. diabetes, asthma, this includes High risk factors for pregnancy, etc.) ---Yes List chronic conditions. ---depression and anxiety  Nurse Assessment Is the patient pregnant or possibly pregnant? (Ask all females between the ages of 96-55) ---No Is this a behavioral health or substance abuse call? ---No Guidelines Guideline Title Affirmed Question Affirmed Notes Nurse Date/Time (Eastern Time) Chest Pain [1] Chest pain (or "angina") comes and goes AND [2] is happening more often (increasing in frequency) or getting worse (increasing in severity) (Exception: Chest pains that last only a few seconds.) Tennis Ship, RN, Clarisse Gouge 05/08/2023 3:39:00 PM Disp. Time Lamount Cohen Time) Disposition Final User 05/08/2023 3:32:58 PM Send to Urgent Simone Curia 05/08/2023 3:44:24 PM Go to ED Now Yes Tennis Ship, RN, Clarisse Gouge Final Disposition 05/08/2023 3:44:24 PM Go to ED Now Yes Tennis Ship, RN, Thayer Dallas Disagree/Comply Comply Caller Understands Yes PreDisposition Call Doctor Care Advice Given Per Guideline GO TO ED NOW: * You need to be seen in the Emergency Department. ANOTHER ADULT SHOULD DRIVE: * It is better and safer if another adult drives instead of you. NOTHING BY MOUTH: * Do not eat or drink anything for now. CALL EMS IF: * You become worse * Passes out or becomes too weak to stand * Severe difficulty breathing occurs CARE ADVICE given per Chest Pain (Adult) guideline. BRING MEDICINES:  Referrals [facility] - ED

## 2023-05-10 ENCOUNTER — Ambulatory Visit (INDEPENDENT_AMBULATORY_CARE_PROVIDER_SITE_OTHER): Payer: BLUE CROSS/BLUE SHIELD | Admitting: Family

## 2023-05-10 VITALS — BP 100/78 | HR 72 | Temp 97.6°F | Ht 65.0 in | Wt 121.2 lb

## 2023-05-10 DIAGNOSIS — M25562 Pain in left knee: Secondary | ICD-10-CM

## 2023-05-10 DIAGNOSIS — R110 Nausea: Secondary | ICD-10-CM

## 2023-05-10 DIAGNOSIS — K2100 Gastro-esophageal reflux disease with esophagitis, without bleeding: Secondary | ICD-10-CM | POA: Diagnosis not present

## 2023-05-10 DIAGNOSIS — E559 Vitamin D deficiency, unspecified: Secondary | ICD-10-CM

## 2023-05-10 DIAGNOSIS — K5909 Other constipation: Secondary | ICD-10-CM | POA: Diagnosis not present

## 2023-05-10 DIAGNOSIS — R5382 Chronic fatigue, unspecified: Secondary | ICD-10-CM | POA: Diagnosis not present

## 2023-05-10 MED ORDER — ONDANSETRON HCL 4 MG PO TABS: 4.0000 mg | ORAL_TABLET | Freq: Three times a day (TID) | ORAL | 0 refills | Status: DC | PRN

## 2023-05-10 MED ORDER — VITAMIN D3 125 MCG (5000 UT) PO CAPS
5000.0000 [IU] | ORAL_CAPSULE | Freq: Every day | ORAL | 3 refills | Status: DC
Start: 2023-05-10 — End: 2024-08-28

## 2023-05-10 MED ORDER — OMEPRAZOLE 20 MG PO CPDR: 20.0000 mg | DELAYED_RELEASE_CAPSULE | Freq: Every day | ORAL | 5 refills | Status: DC

## 2023-05-10 MED ORDER — LINZESS 290 MCG PO CAPS
290.0000 ug | ORAL_CAPSULE | Freq: Every morning | ORAL | Status: DC
Start: 2023-05-10 — End: 2023-08-10

## 2023-05-10 MED ORDER — LINZESS 290 MCG PO CAPS: 290.0000 ug | ORAL_CAPSULE | Freq: Every morning | ORAL | 5 refills | Status: DC

## 2023-05-10 NOTE — Assessment & Plan Note (Addendum)
chronic pt having continued stomach pain & nausea not able to pick up Prilosec sent last visit d/t ins not covering pt has different ins now resending RX advised pt to let me know if not covered f/u 6 m

## 2023-05-10 NOTE — Patient Instructions (Addendum)
It was very nice to see you today!   I have sent over a refill again of the Prilosec for your stomach pain. Let me know if you are still not able to get this. I also have sent generic Zofran for nausea to take as needed.  As discussed, please ask to see your psychiatrist in person to discuss your continued depression on your current medications.  You are on a high dose of Amitriptyline and this can cause fatigue during the day as well as low blood pressure which you are having and this makes you more tired, in addition to the Klonopin you take twice a day for anxiety. Keep meeting with your therapist regularly!  Schedule a follow up visit for 3 months.       PLEASE NOTE:  If you had any lab tests please let us know if you have not heard back within a few days. You may see your results on MyChart before we have a chance to review them but we will give you a call once they are reviewed by [place]. If we ordered any referrals today, please let us know if you have not heard from their office within the next week.

## 2023-05-10 NOTE — Assessment & Plan Note (Signed)
chronic taking Linzess every day refilling med today f/u 6 mos

## 2023-05-10 NOTE — Progress Notes (Signed)
Patient ID: Valerie Richards, female    DOB: [birth year], 52 y.o.   MRN: [id number]  Chief Complaint  Patient presents with  . Follow-up    Pt was seen at ED on 6/17 for SOB. Pt states she is still having SOB all day for a couple months. Walking makes it worse.   . Fatigue    Pt states when she eats she gets very tired and unable to do any activities.    HPI: ED/Cardiology f/u:  seen for chest pain, had testing done at [facility] and all negative. Pt c/o SOB when talking and any exertion. She has had continued fatigue, hypotension. She drinks a lot of coffee, states she also she drinks water.  Acid reflux:  pt has had for years, reports taking a daily medicine in past, but can't remember the name but this helped her sx, reports heartburn, sore throat, belching, dysphagia. Denies epigastric pain or nausea. Left knee pain: hx of Mobic from sports med but didn't help her hands, taking tizanidine prn. Reports pain in front of knee and down her calf.   Assessment & Plan:  Chronic constipation Assessment & Plan: chronic taking Linzess every day refilling med today f/u 6 mos  Orders: -     Linzess; Take 1 capsule (290 mcg total) by mouth every morning.  Gastroesophageal reflux disease with esophagitis without hemorrhage Assessment & Plan: chronic pt having continued stomach pain & nausea not able to pick up Prilosec sent last visit d/t ins not covering pt has different ins now resending RX advised pt to let me know if not covered f/u 6 m  Orders: -     Omeprazole; Take 1 capsule (20 mg total) by mouth daily. START with 1 capsule bid for 2 weeks, then reduce to qd.  Dispense: 45 capsule; Refill: 5  Nausea -     Ondansetron HCl; Take 1 tablet (4 mg total) by mouth every 8 (eight) hours as needed for nausea or vomiting.  Dispense: 20 tablet; Refill: 0  Vitamin D deficiency -     Vitamin D3; Take 1 capsule (5,000 Units total) by mouth daily.  Dispense: 90 capsule; Refill:  3   Subjective:    Outpatient Medications Prior to Visit  Medication Sig Dispense Refill  . amitriptyline (ELAVIL) 150 MG tablet Take 1 tablet (150 mg total) by mouth at bedtime. 30 tablet 2  . clonazePAM (KLONOPIN) 0.5 MG tablet Take 1 tablet (0.5 mg total) by mouth 2 (two) times daily. 60 tablet 2  . estradiol (ESTRACE VAGINAL) 0.1 MG/GM vaginal cream Place 1 g vaginally 2 (two) times a week. Initial dose: Nightly x 2 weeks, then twice weekly 42.5 g 2  . Fremanezumab-vfrm (AJOVY) 225 MG/1.5ML SOAJ Inject 225 mg into the skin every 28 (twenty-eight) days. 1.68 mL 11  . lamoTRIgine (LAMICTAL) 25 MG tablet Take 1 tablet (25 mg total) by mouth daily. 30 tablet 2  . Melatonin 10 MG CAPS Take by mouth.    . Rimegepant Sulfate (NURTEC) 75 MG TBDP Take 1 tablet (75 mg total) by mouth as needed (Take one tablet as needed at the earlist onset of a Migraine.  Max 1 tablet in 24 hours). 16 tablet 5  . tiZANidine (ZANAFLEX) 4 MG tablet Take 1 tablet (4 mg total) by mouth 3 (three) times daily as needed for muscle spasms. 90 tablet 5  . triamcinolone cream (KENALOG) 0.1 % Apply 1 Application topically 2 (two) times daily. 30 g 0  .  Cholecalciferol (VITAMIN D3) 125 MCG (5000 UT) capsule Take 1 capsule (5,000 Units total) by mouth daily. 90 capsule 1  . LINZESS 290 MCG CAPS capsule Take 1 capsule (290 mcg total) by mouth every morning. 30 capsule 5  . meloxicam (MOBIC) 15 MG tablet Take 1 tablet (15 mg total) by mouth daily. 30 tablet 0  . omeprazole (PRILOSEC) 20 MG capsule Take 1 capsule (20 mg total) by mouth daily. START with 1 capsule bid for 2 weeks, then reduce to qd. 45 capsule 0   No facility-administered medications prior to visit.   Past Medical History:  Diagnosis Date  . Acute recurrent ethmoidal sinusitis 09/03/2018  . Anemia    iron deficiency  . Anxiety   . Depression   . Fibroid   . Menorrhagia 01/20/2021  . Migraine   . Nasal pain 09/03/2018  . Stroke [facility]) 12/2020   See MRI.   . Vaginal discharge 01/20/2021  . Vaginitis and vulvovaginitis 01/20/2021   Past Surgical History:  Procedure Laterality Date  . EYE SURGERY Bilateral   . TONSILLECTOMY     No Known Allergies    Objective:    Physical Exam Vitals and nursing note reviewed.  Constitutional:      Appearance: Normal appearance.  Cardiovascular:     Rate and Rhythm: Normal rate and regular rhythm.  Pulmonary:     Effort: Pulmonary effort is normal.     Breath sounds: Normal breath sounds.  Musculoskeletal:        General: Normal range of motion.  Skin:    General: Skin is warm and dry.  Neurological:     Mental Status: She is alert.  Psychiatric:        Mood and Affect: Mood normal.        Behavior: Behavior normal.   BP 100/78   Pulse 72   Temp 97.6 F (36.4 C) (Temporal)   Ht 5\' 5"  (1.651 m)   Wt 121 lb 3.2 oz (55 kg)   LMP 02/26/2020 (Exact Date)   SpO2 100%   BMI 20.17 kg/m  Wt Readings from Last 3 Encounters:  05/10/23 121 lb 3.2 oz (55 kg)  05/08/23 122 lb (55.3 kg)  04/19/23 122 lb (55.3 kg)      Dulce Sellar, NP

## 2023-05-10 NOTE — Telephone Encounter (Signed)
The patient's Medicaid information has been added to her account.  The echo needs to be scheduled & prior auth notified.

## 2023-05-11 ENCOUNTER — Ambulatory Visit (HOSPITAL_BASED_OUTPATIENT_CLINIC_OR_DEPARTMENT_OTHER): Payer: BLUE CROSS/BLUE SHIELD

## 2023-05-11 DIAGNOSIS — Z419 Encounter for procedure for purposes other than remedying health state, unspecified: Secondary | ICD-10-CM | POA: Diagnosis not present

## 2023-05-11 DIAGNOSIS — R5382 Chronic fatigue, unspecified: Secondary | ICD-10-CM | POA: Insufficient documentation

## 2023-05-11 NOTE — Assessment & Plan Note (Signed)
pt has had continous sx w/dizziness as times hypotension - unsure if d/t high dose elavil, dehydration, decreased food intake hx of vit d deficiency antipsychotic meds also can contribute advised pt to be sure taking daily Vit D - sent as a RX try to increase exercise where able call psych and ask for sooner f/u appt to talk about meds must hydrate at least 2L water daily & try to eat mini-meals, healthy snacks every 2 hours f/u prn

## 2023-05-12 DIAGNOSIS — F4000 Agoraphobia, unspecified: Secondary | ICD-10-CM | POA: Diagnosis not present

## 2023-05-12 DIAGNOSIS — F333 Major depressive disorder, recurrent, severe with psychotic symptoms: Secondary | ICD-10-CM | POA: Diagnosis not present

## 2023-05-15 NOTE — Progress Notes (Unsigned)
Aleen Sells D.[facility] [address] [place] [address] Phone: [phone]   Assessment and Plan:     There are no diagnoses linked to this encounter.  ***   Pertinent previous records reviewed include ***   Follow Up: ***     Subjective:   I, Valerie Richards, am serving as a Neurosurgeon for Doctor Richardean Sale   Chief Complaint: pain all over    HPI:    03/14/2023 Patient is a 52 year old female complaining of pain all over. Patient states when she saw PCP Overall "not feeling good". Pain all over. Neck, knees and legs bad to walk and wake her up. Takes tylenol and that does not seem to help . Cramps in legs and feet can "curl". Some joint pain and some cramps day or night. Upper arms/shoulders, etc hurt. Doesn't do a lot to cause pain. When gets up from sitting, hard to get going. Morning stiffness as well. Foot can slide out from her. Feels like some weakness(will have to pick up legs at times). Knees and feet can be burning or numb/tingle. When walking, feels like "needle sticking me in female area". This has been going on for years but the pain has been getting worse over the past couple of weeks. She has had lab work and it shows nothing. She feels like her toes are cramping all the time her hands are weak.    05/16/2023 Patient states    Relevant Historical Information: MDD with patient's husband passing from [facility] disease  Additional pertinent review of systems negative.   Current Outpatient Medications:    amitriptyline (ELAVIL) 150 MG tablet, Take 1 tablet (150 mg total) by mouth at bedtime., Disp: 30 tablet, Rfl: 2   Cholecalciferol (VITAMIN D3) 125 MCG (5000 UT) CAPS, Take 1 capsule (5,000 Units total) by mouth daily., Disp: 90 capsule, Rfl: 3   clonazePAM (KLONOPIN) 0.5 MG tablet, Take 1 tablet (0.5 mg total) by mouth 2 (two) times daily., Disp: 60 tablet, Rfl: 2   estradiol (ESTRACE VAGINAL) 0.1 MG/GM vaginal cream,  Place 1 g vaginally 2 (two) times a week. Initial dose: Nightly x 2 weeks, then twice weekly, Disp: 42.5 g, Rfl: 2   Fremanezumab-vfrm (AJOVY) 225 MG/1.5ML SOAJ, Inject 225 mg into the skin every 28 (twenty-eight) days., Disp: 1.68 mL, Rfl: 11   lamoTRIgine (LAMICTAL) 25 MG tablet, Take 1 tablet (25 mg total) by mouth daily., Disp: 30 tablet, Rfl: 2   LINZESS 290 MCG CAPS capsule, Take 1 capsule (290 mcg total) by mouth every morning., Disp: , Rfl:    Melatonin 10 MG CAPS, Take by mouth., Disp: , Rfl:    omeprazole (PRILOSEC) 20 MG capsule, Take 1 capsule (20 mg total) by mouth daily. START with 1 capsule bid for 2 weeks, then reduce to qd., Disp: 45 capsule, Rfl: 5   ondansetron (ZOFRAN) 4 MG tablet, Take 1 tablet (4 mg total) by mouth every 8 (eight) hours as needed for nausea or vomiting., Disp: 20 tablet, Rfl: 0   Rimegepant Sulfate (NURTEC) 75 MG TBDP, Take 1 tablet (75 mg total) by mouth as needed (Take one tablet as needed at the earlist onset of a Migraine.  Max 1 tablet in 24 hours)., Disp: 16 tablet, Rfl: 5   tiZANidine (ZANAFLEX) 4 MG tablet, Take 1 tablet (4 mg total) by mouth 3 (three) times daily as needed for muscle spasms., Disp: 90 tablet, Rfl: 5   triamcinolone cream (  KENALOG) 0.1 %, Apply 1 Application topically 2 (two) times daily., Disp: 30 g, Rfl: 0   Objective:     There were no vitals filed for this visit.    There is no height or weight on file to calculate BMI.    Physical Exam:    ***   Electronically signed by:  Aleen Sells [facility] 11:11 AM 05/15/23

## 2023-05-16 ENCOUNTER — Ambulatory Visit (INDEPENDENT_AMBULATORY_CARE_PROVIDER_SITE_OTHER): Payer: BLUE CROSS/BLUE SHIELD | Admitting: Sports Medicine

## 2023-05-16 VITALS — HR 78 | Ht 65.0 in | Wt 120.0 lb

## 2023-05-16 DIAGNOSIS — M2550 Pain in unspecified joint: Secondary | ICD-10-CM | POA: Diagnosis not present

## 2023-05-16 DIAGNOSIS — M25562 Pain in left knee: Secondary | ICD-10-CM

## 2023-05-16 DIAGNOSIS — M545 Low back pain, unspecified: Secondary | ICD-10-CM | POA: Diagnosis not present

## 2023-05-16 DIAGNOSIS — M546 Pain in thoracic spine: Secondary | ICD-10-CM | POA: Diagnosis not present

## 2023-05-16 DIAGNOSIS — M542 Cervicalgia: Secondary | ICD-10-CM | POA: Diagnosis not present

## 2023-05-16 DIAGNOSIS — G8929 Other chronic pain: Secondary | ICD-10-CM | POA: Diagnosis not present

## 2023-05-16 DIAGNOSIS — F4000 Agoraphobia, unspecified: Secondary | ICD-10-CM | POA: Diagnosis not present

## 2023-05-16 DIAGNOSIS — F333 Major depressive disorder, recurrent, severe with psychotic symptoms: Secondary | ICD-10-CM | POA: Diagnosis not present

## 2023-05-16 MED ORDER — PREDNISONE 10 MG PO TABS: ORAL_TABLET | ORAL | 0 refills | Status: DC

## 2023-05-16 NOTE — Patient Instructions (Signed)
Prednisone 10 mg daily for 20 days PT referral  6 week follow up

## 2023-05-19 DIAGNOSIS — F4000 Agoraphobia, unspecified: Secondary | ICD-10-CM | POA: Diagnosis not present

## 2023-05-19 DIAGNOSIS — F333 Major depressive disorder, recurrent, severe with psychotic symptoms: Secondary | ICD-10-CM | POA: Diagnosis not present

## 2023-05-22 DIAGNOSIS — F333 Major depressive disorder, recurrent, severe with psychotic symptoms: Secondary | ICD-10-CM | POA: Diagnosis not present

## 2023-05-22 DIAGNOSIS — F4000 Agoraphobia, unspecified: Secondary | ICD-10-CM | POA: Diagnosis not present

## 2023-05-22 DIAGNOSIS — Z7689 Persons encountering health services in other specified circumstances: Secondary | ICD-10-CM | POA: Diagnosis not present

## 2023-05-26 DIAGNOSIS — F4000 Agoraphobia, unspecified: Secondary | ICD-10-CM | POA: Diagnosis not present

## 2023-05-26 DIAGNOSIS — Z7689 Persons encountering health services in other specified circumstances: Secondary | ICD-10-CM | POA: Diagnosis not present

## 2023-05-26 DIAGNOSIS — F333 Major depressive disorder, recurrent, severe with psychotic symptoms: Secondary | ICD-10-CM | POA: Diagnosis not present

## 2023-05-29 ENCOUNTER — Ambulatory Visit (INDEPENDENT_AMBULATORY_CARE_PROVIDER_SITE_OTHER): Payer: Medicaid Other | Admitting: Family

## 2023-05-29 VITALS — BP 108/69 | HR 62 | Temp 97.5°F | Ht 65.0 in | Wt 119.6 lb

## 2023-05-29 DIAGNOSIS — R21 Rash and other nonspecific skin eruption: Secondary | ICD-10-CM

## 2023-05-29 DIAGNOSIS — M2550 Pain in unspecified joint: Secondary | ICD-10-CM

## 2023-05-29 DIAGNOSIS — Z7689 Persons encountering health services in other specified circumstances: Secondary | ICD-10-CM | POA: Diagnosis not present

## 2023-05-29 NOTE — Assessment & Plan Note (Signed)
chronic worse over last several months failed Cymbalta & Mobic, Gabapentin contraindicated d/t other meds followed by sports med - dr Jean Rosenthal starting PT this week providing temporary handicap sticker f/u prn

## 2023-05-29 NOTE — Progress Notes (Signed)
Patient ID: Valerie Richards, female    DOB: 07/17/1971, 52 y.o.   MRN: [id number]  Chief Complaint  Patient presents with   Rash    Pt c/o rash between toes, fingers and face. Pt c/o itchiness and burning for years off and on. Has tried lotions and epsom salt which does not help.    Extremity Weakness    Pt c/o bilateral leg pain and weakness for years.    HPI: Polyarthralgia/Lower leg pain/weakness:  hx of Mobic from sports med but didn't help, taking tizanidine prn. Reports pain in front of knee and down her calf. pain in left knee, has seen sports med office recently and he suggested steroid injection, but pt refused as it did not help in past, so given oral prednisone & she is starting PT.  Per review of sports med note: "arthralgia globally for years that is generally worsening over the past several months. Patient had no improvement with meloxicam. Overall her lab work and workup has been relatively unremarkable which has included normal electrolytes, vitamin D, TSH, vitamin B12, negative ANA, unremarkable complement levels, normal autoimmune and rheumatologic workup, mild degenerative changes in cervical spine, minimal changes in bilateral feet, bilateral knees, thoracic spine, lumbar spine, worsening pain & overall arthralgia may be psychosomatic", which pt agrees as possibility d/t still having major depression sx despite taking meds & having therapy, but lack of motivation to go out or do activities. Pt is asking for handicap tag for her car today d/t difficulty walking long distances.  Skin rash:  pt reports rash between toes on both feet. Behind her 4th toe on right foot, she has a dark blister that has been present for about a year. She has cut it back and cleaned well but it keeps coming back. She has to wear open shoes, states regular shoes cause her pain, reports burning sensation and itching. Facial rash on both cheeks, sometimes on chin, states this is also itchy. Sometimes has  redness on her cheeks but then goes away. Patient has tried various creams and lotions, including Nivea and coconut oil, without relief.  Steroid cream prescribed by gynecologist for the dark spots on her cheeks did not help.   Assessment & Plan:  Skin rash- Advised pt to use triamcinolone cream mixed with Nivea for facial itching and triamcinolone alone for toes. Sending referral to [facility].  -     Ambulatory referral to Dermatology  Polyarthralgia Assessment & Plan: chronic worse over last several months failed Cymbalta & Mobic, Gabapentin contraindicated d/t other meds followed by sports med - dr Jean Rosenthal starting PT this week providing temporary handicap sticker f/u prn    Subjective:    Outpatient Medications Prior to Visit  Medication Sig Dispense Refill   amitriptyline (ELAVIL) 150 MG tablet Take 1 tablet (150 mg total) by mouth at bedtime. 30 tablet 2   Cholecalciferol (VITAMIN D3) 125 MCG (5000 UT) CAPS Take 1 capsule (5,000 Units total) by mouth daily. 90 capsule 3   clonazePAM (KLONOPIN) 0.5 MG tablet Take 1 tablet (0.5 mg total) by mouth 2 (two) times daily. 60 tablet 2   estradiol (ESTRACE VAGINAL) 0.1 MG/GM vaginal cream Place 1 g vaginally 2 (two) times a week. Initial dose: Nightly x 2 weeks, then twice weekly 42.5 g 2   Fremanezumab-vfrm (AJOVY) 225 MG/1.5ML SOAJ Inject 225 mg into the skin every 28 (twenty-eight) days. 1.68 mL 11   lamoTRIgine (LAMICTAL) 25 MG tablet Take 1 tablet (25 mg total) by  mouth daily. 30 tablet 2   LINZESS 290 MCG CAPS capsule Take 1 capsule (290 mcg total) by mouth every morning.     Melatonin 10 MG CAPS Take by mouth.     omeprazole (PRILOSEC) 20 MG capsule Take 1 capsule (20 mg total) by mouth daily. START with 1 capsule bid for 2 weeks, then reduce to qd. 45 capsule 5   ondansetron (ZOFRAN) 4 MG tablet Take 1 tablet (4 mg total) by mouth every 8 (eight) hours as needed for nausea or vomiting. 20 tablet 0   predniSONE (DELTASONE) 10 MG  tablet Take one tablet daily for the next 20 days. 20 tablet 0   Rimegepant Sulfate (NURTEC) 75 MG TBDP Take 1 tablet (75 mg total) by mouth as needed (Take one tablet as needed at the earlist onset of a Migraine.  Max 1 tablet in 24 hours). 16 tablet 5   tiZANidine (ZANAFLEX) 4 MG tablet Take 1 tablet (4 mg total) by mouth 3 (three) times daily as needed for muscle spasms. 90 tablet 5   triamcinolone cream (KENALOG) 0.1 % Apply 1 Application topically 2 (two) times daily. 30 g 0   No facility-administered medications prior to visit.   Past Medical History:  Diagnosis Date   Acute recurrent ethmoidal sinusitis 09/03/2018   Anemia    iron deficiency   Anxiety    Depression    Fibroid    Menorrhagia 01/20/2021   Migraine    Nasal pain 09/03/2018   Stroke (HCC) 12/2020   See MRI.   Vaginal discharge 01/20/2021   Vaginitis and vulvovaginitis 01/20/2021   Past Surgical History:  Procedure Laterality Date   EYE SURGERY Bilateral    TONSILLECTOMY     No Known Allergies    Objective:    Physical Exam Vitals and nursing note reviewed.  Constitutional:      Appearance: Normal appearance.  Cardiovascular:     Rate and Rhythm: Normal rate and regular rhythm.  Pulmonary:     Effort: Pulmonary effort is normal.     Breath sounds: Normal breath sounds.  Musculoskeletal:        General: Normal range of motion.  Skin:    General: Skin is warm and dry.     Findings: Lesion (hard, dark brown callous-like bump on back of 4th toe right foot) and rash (pinpoint bumps on top of feet near toes, flesh colored, dark brown freckle-like spots on bilateral upper cheeks) present.  Neurological:     Mental Status: She is alert.  Psychiatric:        Mood and Affect: Mood normal.        Behavior: Behavior normal.    BP 108/69 (BP Location: Left Arm, Patient Position: Sitting, Cuff Size: Normal)   Pulse 62   Temp (!) 97.5 F (36.4 C) (Temporal)   Ht 5\' 5"  (1.651 m)   Wt 119 lb 9.6 oz (54.3  kg)   LMP 02/26/2020 (Exact Date)   SpO2 100%   BMI 19.90 kg/m  Wt Readings from Last 3 Encounters:  05/29/23 119 lb 9.6 oz (54.3 kg)  05/16/23 120 lb (54.4 kg)  05/10/23 121 lb 3.2 oz (55 kg)      Dulce Sellar, NP

## 2023-05-30 ENCOUNTER — Ambulatory Visit (HOSPITAL_COMMUNITY): Payer: Medicaid Other | Attending: Cardiology

## 2023-05-30 ENCOUNTER — Other Ambulatory Visit (HOSPITAL_COMMUNITY): Payer: Self-pay | Admitting: Psychiatry

## 2023-05-30 DIAGNOSIS — Z7689 Persons encountering health services in other specified circumstances: Secondary | ICD-10-CM | POA: Diagnosis not present

## 2023-05-30 DIAGNOSIS — I3139 Other pericardial effusion (noninflammatory): Secondary | ICD-10-CM | POA: Diagnosis not present

## 2023-05-30 DIAGNOSIS — R0602 Shortness of breath: Secondary | ICD-10-CM | POA: Diagnosis not present

## 2023-05-30 DIAGNOSIS — F333 Major depressive disorder, recurrent, severe with psychotic symptoms: Secondary | ICD-10-CM | POA: Diagnosis not present

## 2023-05-30 DIAGNOSIS — F4000 Agoraphobia, unspecified: Secondary | ICD-10-CM | POA: Diagnosis not present

## 2023-05-30 DIAGNOSIS — I081 Rheumatic disorders of both mitral and tricuspid valves: Secondary | ICD-10-CM

## 2023-05-30 DIAGNOSIS — F323 Major depressive disorder, single episode, severe with psychotic features: Secondary | ICD-10-CM

## 2023-05-30 LAB — ECHOCARDIOGRAM COMPLETE
Area-P 1/2: 3.06 cm2
S' Lateral: 2.1 cm

## 2023-05-31 ENCOUNTER — Other Ambulatory Visit: Payer: Self-pay | Admitting: *Deleted

## 2023-05-31 DIAGNOSIS — F4000 Agoraphobia, unspecified: Secondary | ICD-10-CM | POA: Diagnosis not present

## 2023-05-31 DIAGNOSIS — Z7689 Persons encountering health services in other specified circumstances: Secondary | ICD-10-CM | POA: Diagnosis not present

## 2023-05-31 DIAGNOSIS — I059 Rheumatic mitral valve disease, unspecified: Secondary | ICD-10-CM

## 2023-05-31 DIAGNOSIS — F333 Major depressive disorder, recurrent, severe with psychotic symptoms: Secondary | ICD-10-CM | POA: Diagnosis not present

## 2023-05-31 NOTE — Therapy (Signed)
OUTPATIENT PHYSICAL THERAPY THORACOLUMBAR EVALUATION   Patient Name: Valerie Richards MRN: [id number] DOB:[birth year], 52 y.o., female Today's Date: 06/01/2023  END OF SESSION:  PT End of Session - 06/01/23 1311     Visit Number 1    Number of Visits 12    Date for PT Re-Evaluation 07/13/23    Authorization Type [facility]    PT Start Time 0932    PT Stop Time 1015    PT Time Calculation (min) 43 min    Activity Tolerance Patient tolerated treatment well;Patient limited by fatigue;Patient limited by pain    Behavior During Therapy [facility] for tasks assessed/performed             Past Medical History:  Diagnosis Date   Acute recurrent ethmoidal sinusitis 09/03/2018   Anemia    iron deficiency   Anxiety    Depression    Fibroid    Menorrhagia 01/20/2021   Migraine    Nasal pain 09/03/2018   Stroke (HCC) 12/2020   See MRI.   Vaginal discharge 01/20/2021   Vaginitis and vulvovaginitis 01/20/2021   Past Surgical History:  Procedure Laterality Date   EYE SURGERY Bilateral    TONSILLECTOMY     Patient Active Problem List   Diagnosis Date Noted   Polyarthralgia 05/29/2023   Chronic fatigue 05/11/2023   Vitamin D deficiency 05/10/2023   Gastroesophageal reflux disease with esophagitis without hemorrhage 05/10/2023   Chronic constipation 11/14/2022   Severe episode of recurrent major depressive disorder (HCC) 09/26/2019   Nasal congestion 09/03/2018   Nonintractable headache 09/03/2018   Arthralgia of both hands 02/28/2018   Uterine leiomyoma 11/30/2017   Symptomatic anemia 08/02/2016    PCP: Dulce Sellar, NP   REFERRING PROVIDER: Richardean Sale, DO   REFERRING DIAG:  M25.50 (ICD-10-CM) - Polyarthralgia  M54.2 (ICD-10-CM) - Neck pain  M54.6,G89.29 (ICD-10-CM) - Chronic bilateral thoracic back pain  M54.50,G89.29 (ICD-10-CM) - Chronic bilateral low back pain without sciatica  M25.562,G89.29 (ICD-10-CM) - Chronic pain of left knee    Rationale for  Evaluation and Treatment: Rehabilitation  THERAPY DIAG:  Polyarthralgia - Plan: PT plan of care cert/re-cert  Neck pain - Plan: PT plan of care cert/re-cert  Chronic bilateral thoracic back pain - Plan: PT plan of care cert/re-cert  Chronic bilateral low back pain without sciatica - Plan: PT plan of care cert/re-cert  Chronic pain of left knee - Plan: PT plan of care cert/re-cert  ONSET DATE: Pain >  10 years  SUBJECTIVE:  SUBJECTIVE STATEMENT: I have been to doctors and PT but I felt it was not helping me get better. I was caring for my husband with ALS and he passed away 06-22-2016. I was having to carry him.  I have had lots of pain over my neck and shoulders, back and both legs.  I have had depression as well and I am on medication and see a counselor 2 x a week. 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 When I sleep, it takes me time to move my legs to help it feel better from the severe pain. I wake up with medication 3 hours  but without I wake up every 2 hours.   I dont exercise at all.  I don't walk. I tried aquatics but I dont want to do it.  Sometime I don't want to go on living.  I am just so depressed.   PERTINENT HISTORY:   CVA,  anxiety, depression, "anthralgia globally for years". Mild degenerative changes, chronic fatigue  PAIN:  Are you having pain? Yes: NPRS scale: back 7/10  at worst 9/10  neck pain 6/10 at worst 8/10 Pain location: neck, thoracic low back and both knees but left knee is the worst Pain description: Pain in knees 9-10/10 and sharp achy pain everywhere Aggravating factors: standing up to wash dishes less than 3 minutes and stand to wash in shower or comb  my hair  I feel tired and I want to pass out.  Vacuuming is difficult but my daughter does it, cooking I can  only stand for  10 min  Relieving factors: medication and sitting down. I would love to go grocery shopping but my daugher does it for me   PRECAUTIONS: FALLS  RED FLAGS: None   WEIGHT BEARING RESTRICTIONS: No  FALLS:  Has patient fallen in last 6 months? Yes. Number of falls 5 x   one time I passed out and one time on the steps and once in the bathroom, sometimes the medicine makes me feel dizzy and sometimes I feel my legs are week  LIVING ENVIRONMENT: Lives with: lives alone Lives in: House/apartment Stairs: Yes: Internal: 12 steps; can reach both and External: 3 steps; can reach both  Has following equipment at home: Single point cane  OCCUPATION: unemployed  PLOF: Independent  PATIENT GOALS: Since my husband died I have been depressed.  My goals for PT to feel less depressed and to get stronger  NEXT MD VISIT: TBD Screening for Suicide  Answer the following questions with Yes or No and place an "x" beside the action taken.  1. Over the past two weeks, have you felt down, depressed, or hopeless?   YES  2. Within the past two weeks, have you felt little interest or pleasure in life?  YES  If YES to either #1 or #2, then ask #3  3. Have you had thoughts that that life is not worth living or that you might be   YES     better off dead?     If answer is NO and suspicion is low, then end   4. Over this past week, have you had any thoughts about hurting or even killing yourself?  NO  If NO, then end. Patient in no immediate danger   5. If so, do you believe that you intend to or will harm yourself?  NO     If NO, then end. Patient in no immediate danger  6.  Do you have a plan as to how you would hurt yourself?  NO   7.  Over this past week, have you actually done anything to hurt yourself? NO   IF YES answers to either #4, #5, #6 or #7, then patient is AT RISK for suicide   Actions Taken  ____  Screening negative; no further action required  __x__   Screening positive; no immediate danger and patient already in treatment with a  mental health provider. Advise patient to speak to their mental health provider.  ____  Screening positive; no immediate danger. Patient advised to contact a mental  health provider for further assessment.   ____  Screening positive; in immediate danger as patient states intention of killing self,  has plan and a sense of imminence. Do not leave alone. Seek permission from  patient to contact a family member to inform them. Direct patient to go to ED.  OBJECTIVE:   DIAGNOSTIC FINDINGS:   New Cervical MRI ordered EXAM: MRI CERVICAL SPINE WITHOUT AND WITH CONTRAST   TECHNIQUE: Multiplanar and multiecho pulse sequences of the cervical spine, to include the craniocervical junction and cervicothoracic junction, were obtained without and with intravenous contrast.   CONTRAST:  5mL GADAVIST GADOBUTROL 1 MMOL/ML IV SOLN   COMPARISON:  MRI of the cervical spine 07/07/2021.   FINDINGS: Alignment: Reversal the normal cervical lordosis, similar. Similar trace grade 1 retrolisthesis of C5 on C6.   Vertebrae: Unchanged 5 mm C7 lesion, which remains similar appearance (T2 hyperintense and T1 hypointense). This lesion does enhance.   Cord: Normal cord signal.   Posterior Fossa, vertebral arteries, paraspinal tissues: Visualized vertebral artery flow voids are maintained. No evidence of acute abnormality in the visualized posterior fossa.   Disc levels:   C2-C3: No significant disc protrusion, foraminal stenosis, or canal stenosis.   C3-C4: Small central disc protrusion and mild facet arthropathy without significant canal or foraminal stenosis. Unchanged.   C4-C5: No significant disc protrusion, foraminal stenosis, or canal stenosis.   C5-C6: Trace grade 1 retrolisthesis. Similar small posterior disc osteophyte complex and facet/uncovertebral hypertrophy. Similar mild resulting bilateral foraminal  stenosis. Patent canal. No significant change.   C6-C7: No significant disc protrusion, foraminal stenosis, or canal stenosis.   C7-T1: No significant disc protrusion, foraminal stenosis, or canal stenosis.   IMPRESSION: 1. 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 2. Unchanged multilevel degenerative change, detailed above.     Electronically Signed   By: Feliberto Harts M.D.   On: 07/03/2022 13:27  PATIENT SURVEYS:  FOTO 34% predicted 48%  SCREENING FOR RED FLAGS: Bowel or bladder incontinence: No   COGNITION: Overall cognitive status: Within functional limits for tasks assessed     SENSATION: Pt reports paresthesias bil hands feet  MUSCLE LENGTH: Hamstrings: Right   WNL   POSTURE: rounded shoulders, forward head, and ectomorphic body type  PALPATION: Tender to palpation globally over neck back and hips and knees   CERVICAL ROM:  Pain with  all motions  Active ROM A/PROM (deg) eval  Flexion 45  Extension 40 pain  Right lateral flexion 15  Left lateral flexion 18  Right rotation 40  Left rotation 32   (Blank rows = not tested)    LUMBAR ROM:   AROM eval  Flexion 75%  Extension 75%  Right lateral flexion 75%  Left lateral flexion 75%  Right rotation 50%  Left rotation 50%   (Blank rows = not  tested)  LOWER EXTREMITY ROM:   All WFL except otherwise noted  Active  Right eval Left eval  Hip flexion    Hip extension    Hip abduction    Hip adduction    Hip internal rotation 25 38  Hip external rotation 28 45  Knee flexion    Knee extension    Ankle dorsiflexion    Ankle plantarflexion    Ankle inversion    Ankle eversion     (Blank rows = not tested)  LOWER EXTREMITY MMT:    MMT Right eval Left eval  Hip flexion 3+ 3-  Hip extension 4- 3+  Hip abduction 3 3  Hip adduction    Hip internal rotation    Hip external rotation    Knee flexion 4- 4-  Knee extension 4- 4-   Ankle dorsiflexion    Ankle plantarflexion 3/25 cramping 5/25 cramping  Ankle inversion    Ankle eversion     (Blank rows = not tested)  LUMBAR SPECIAL TESTS:  Straight leg raise test: Negative and Slump test: Negative  FUNCTIONAL TESTS:  5 times sit to stand: 26.81 sec 6 minute walk test: TBD Berg Balance Scale: 46/56 TUG  18 sec BERG BALANCE  Sitting to Standing: Numbers; 0-4: 4  4. Stands without using hands and stabilize independently  3. Stands independently using hands  2. Stands using hands after multiple trials  1. Min A to stand  0. Mod-Max A to stand Standing unsupported: Numbers; 0-4: 4  4. Stands safely for 2 minutes  3. Stands 2 minutes with supervision  2. Stands 30 seconds unsupported  1. Needs several tries to stand unsupported for 30 seconds  0. Unable to stand unsupported for 30 seconds Sitting unsupported: Numbers; 0-4: 4  4. Sits for 2 minutes independently  3. Sits for 2 minutes with supervision  2. Able to sit 30 seconds  1. Able to sit 10 seconds  0. Unable to sit for 10 seconds Standing to Sitting: Numbers; 0-4: 4 4. Sits safely with minimal use of hands 3. Controls descent with hands 2. Uses back of legs against chair to control descent 1. Sits independently, but uncontrolled descent 0. Needs assistance Transfers: Numbers; 0-4: 4  4. Transfers safely with minor use of hands  3. Transfers safely definite use of hands  2. Transfers with verbal cueing/supervision  1. Needs 1 person assist  0. Needs 2 person assist  Standing with eyes closed: Numbers; 0-4: 4  4. Stands safely for 10 seconds  3. Stands 10 seconds with supervision   2. Able to stand for 3 seconds  1. Unable to keep eyes closed for 3 seconds, but is safe  0. Needs assist to keep from falling Standing with feet together: Numbers; 0-4: 3 4. Stands for 1 minute safely 3. Stands for 1 minute with supervision 2. Unable to hold for 30 seconds  1. Needs help to attain position but  can hold for 15 seconds  0. Needs help to attain position and unable to hold for 15 seconds Reaching forward with outstretched arm: Numbers; 0-4: 4  4. Reaches forward 10 inches  3. Reaches forward 5 inches  2. Reaches forward 2 inches  1. Reaches forward with supervision  0. Loses balance/requires assistace Retrieving object from the floor: Numbers; 0-4: 3 4. Able to pick up easily and safely 3. Able to pick up with supervision 2. Unable to pick up, but reaches within 1-2 inches independently 1. Unable to pick  up and needs supervision 0. Unable/needs assistance to keep from falling  Turning to look behind: Numbers; 0-4: 4  4. Looks behind from both sides and weight shifts well  3. Looks behind one side only, other side less weight shift  2. Turns sideways only, maintains balance  1. Needs supervision when turning  0. Needs assistance  Turning 360 degrees: Numbers; 0-4: 4  4. Able to turn in </=4 seconds  3. Able to turn on one side in </= 4 seconds   2. Able to turn slowly, but safely  1. Needs supervision or verbal cueing  0. Needs assistance Place alternate foot on stool: Numbers; 0-4: 3 4. Completes 8 steps in 20 seconds 3. Completes 8 steps in >20 seconds 2. 4 steps without assistance/supervision 1. Completes >2 steps with minimal assist 0. Unable, needs assist to keep from falling Standing with one foot in front: Numbers; 0-4: 0  4. Independent tandem for 30 seconds  3. Independent foot ahead for 30 seconds  2. Independent small step for 30 seconds  1. Needs help to step, but can hold for 15 seconds  0. Loses balance while standing/stepping Standing on one foot: Numbers; 0-4: 1 4. Holds >10 seconds 3. Holds 5-10 seconds 2. Holds >/=3 seconds  1. Holds <3 seconds 0. Unable   Total Score: 46/56  GAIT: Distance walked: 150 Assistive device utilized: Single point cane Level of assistance: Complete Independence Comments: Slow cadence  decreased hip flexion   decreased arm swing.   TODAY'S TREATMENT:                                                                                                                              DATE: EVAL 06-01-23    PATIENT EDUCATION:  Education details: POC Explanation of findings  Person educated: Patient Education method: Explanation and Demonstration Education comprehension: verbalized understanding and returned demonstration  HOME EXERCISE PROGRAM: To Be issued  ASSESSMENT:  CLINICAL IMPRESSION: Patient is a 52 y.o. female  with English as second language but communicates well in English who was seen today for physical therapy evaluation and treatment for polyarthralgia with pain in neck. Low back, thoracic spine, bil hips and knees.  Pt has most complaint of knee pain at 10/10 pain  L > R.  All else up to 9/10 pain.  Pt reports . She has experienced death of husband from ALS in 06-23-16 and reports depression and lack of interest in living but is under care of mental health professionals.  She reports she wants to be able to be stronger and play with grandchild. Pt demonstrates global weakness and has to motivated to participate in more than a few movements at a time. With testing pt is weak throughout LE's  and core along with weakness and discomfort in right shoulder, upper back and cervical area. She reports increasing pain in last 6 months but has had pain for greater than 10 years.  She reports  several falls due to dizziness and also with her LE " giving out. Pt with ectomorphic body type. Pt will benefit from skilled PT to address impairments and to increase global strength for better QOL  OBJECTIVE IMPAIRMENTS: decreased activity tolerance, difficulty walking, decreased ROM, decreased strength, dizziness, impaired sensation, improper body mechanics, postural dysfunction, and pain.   ACTIVITY LIMITATIONS: carrying, lifting, bending, sitting, standing, squatting, sleeping, stairs, transfers, and locomotion  level  PARTICIPATION LIMITATIONS: meal prep, cleaning, laundry, driving, and community activity  PERSONAL FACTORS:  CVA,  anxiety, depression, "anthralgia globally for years". Mild degenerative changes, chronic fatigue are also affecting patient's functional outcome.   REHAB POTENTIAL: Fair inconsistent attendance in past therapies.   CLINICAL DECISION MAKING: Evolving/moderate complexity  EVALUATION COMPLEXITY: Moderate   GOALS: Goals reviewed with patient? Yes  SHORT TERM GOALS: Target date: 06-15-23  Pt will  be independent with initial HEP Baseline:no knowledge Goal status: INITIAL  2.  Pt will be able to stand with symmetrical weight in Bil LE's   Baseline:  PT wt bears on Right in standing Goal status: INITIAL  3.  Pt pain will be decreased by 25 % for functional activities Baseline: 10/10 in Eval  Goal status: INITIAL  4.  Pt will begin a home walking program to build strength and endurance Baseline: sedentary no exercise Goal status: INITIAL  5. Berg Balance score to improve to 52/56 or > to demonstrate improvement in balance and safety   Baseline: 46/56 eval  Goal Status  INITIAL    LONG TERM GOALS: Target date: 07-13-23  Pt will be independent with advanced HEP Baseline: no knowledge Goal status: INITIAL  2.  Pt will be able to  work on household chores 30 minutes Baseline: Unable to stand for 3 min Goal status: INITIAL  3.  Pt will be able to negotiate steps with pain 4/10 or less Baseline:  Goal status: INITIAL  4.  Pt will be able to improve BERG balance Baseline:  Goal status: INITIAL  5.  FOTO will improve from 34%   to  58%   indicating improved functional mobility.  Baseline: eval 34% Goal status: INITIAL  6.  Pt will be improve the 6 MWT to at least 107ft  in 6 minutes Baseline:  Pt unable to tolerate standing longer than 3 minutes Goal status: INITIAL  PLAN:  PT FREQUENCY: 1-2x/week  PT DURATION: 6 weeks  PLANNED INTERVENTIONS:  Therapeutic exercises, Therapeutic activity, Neuromuscular re-education, Balance training, Gait training, Patient/Family education, Self Care, Joint mobilization, Stair training, DME instructions, Dry Needling, Electrical stimulation, Cryotherapy, Moist heat, Taping, Manual therapy, and Re-evaluation.  PLAN FOR NEXT SESSION: issue HEP.    establish walking program  Garen Lah, PT, [facility] Certified Exercise Expert for the Aging Adult  06/01/23 3:42 PM Phone: [phone] Fax: [phone]   Check all possible CPT codes: 15176 - PT Re-evaluation, 97110- Therapeutic Exercise, [phone]- Neuro Re-education, [phone] - Gait Training, [phone] - Manual Therapy, 97530 - Therapeutic Activities, 97535 - Self Care, and [phone] - Physical performance training    Check all conditions that are expected to impact treatment: {Conditions expected to impact treatment:Musculoskeletal disorders and Psychological or psychiatric disorders   If treatment provided at initial evaluation, no treatment charged due to lack of authorization.

## 2023-06-01 ENCOUNTER — Ambulatory Visit: Payer: Medicaid Other | Attending: Sports Medicine | Admitting: Physical Therapy

## 2023-06-01 ENCOUNTER — Encounter: Payer: Self-pay | Admitting: Physical Therapy

## 2023-06-01 DIAGNOSIS — M2550 Pain in unspecified joint: Secondary | ICD-10-CM | POA: Diagnosis not present

## 2023-06-01 DIAGNOSIS — M546 Pain in thoracic spine: Secondary | ICD-10-CM | POA: Diagnosis not present

## 2023-06-01 DIAGNOSIS — M545 Low back pain, unspecified: Secondary | ICD-10-CM | POA: Diagnosis not present

## 2023-06-01 DIAGNOSIS — M542 Cervicalgia: Secondary | ICD-10-CM | POA: Insufficient documentation

## 2023-06-01 DIAGNOSIS — G8929 Other chronic pain: Secondary | ICD-10-CM | POA: Insufficient documentation

## 2023-06-01 DIAGNOSIS — Z7689 Persons encountering health services in other specified circumstances: Secondary | ICD-10-CM | POA: Diagnosis not present

## 2023-06-01 DIAGNOSIS — M25562 Pain in left knee: Secondary | ICD-10-CM | POA: Diagnosis not present

## 2023-06-04 DIAGNOSIS — F333 Major depressive disorder, recurrent, severe with psychotic symptoms: Secondary | ICD-10-CM | POA: Diagnosis not present

## 2023-06-04 DIAGNOSIS — F4000 Agoraphobia, unspecified: Secondary | ICD-10-CM | POA: Diagnosis not present

## 2023-06-06 ENCOUNTER — Other Ambulatory Visit: Payer: Self-pay | Admitting: Nurse Practitioner

## 2023-06-06 ENCOUNTER — Telehealth: Payer: Self-pay

## 2023-06-06 DIAGNOSIS — N952 Postmenopausal atrophic vaginitis: Secondary | ICD-10-CM

## 2023-06-06 MED ORDER — PREMARIN 0.625 MG/GM VA CREA
1.0000 g | TOPICAL_CREAM | VAGINAL | 1 refills | Status: DC
Start: 2023-06-08 — End: 2023-12-22

## 2023-06-06 NOTE — Telephone Encounter (Signed)
Alternative Premarin ordered. Thanks.

## 2023-06-06 NOTE — Telephone Encounter (Signed)
Prior autherization sent to [facility] for estradiol cream. PA denied. List of preferred medication is Estring vaginal ring, Premarin vaginal cream, Vagifem vaginal tablet.

## 2023-06-07 DIAGNOSIS — F333 Major depressive disorder, recurrent, severe with psychotic symptoms: Secondary | ICD-10-CM | POA: Diagnosis not present

## 2023-06-07 DIAGNOSIS — F4000 Agoraphobia, unspecified: Secondary | ICD-10-CM | POA: Diagnosis not present

## 2023-06-08 NOTE — Therapy (Signed)
OUTPATIENT PHYSICAL THERAPY THORACOLUMBAR EVALUATION   Patient Name: Valerie Richards MRN: [id number] DOB:[birth year], 52 y.o., female Today's Date: 06/13/2023  END OF SESSION:  PT End of Session - 06/13/23 0806     Visit Number 2    Number of Visits 12    Date for PT Re-Evaluation 07/13/23    Authorization Type [facility]    PT Start Time 0806    PT Stop Time 0847    PT Time Calculation (min) 41 min    Activity Tolerance Patient tolerated treatment well;Patient limited by fatigue;Patient limited by pain    Behavior During Therapy [facility] for tasks assessed/performed              Past Medical History:  Diagnosis Date   Acute recurrent ethmoidal sinusitis 09/03/2018   Anemia    iron deficiency   Anxiety    Depression    Fibroid    Menorrhagia 01/20/2021   Migraine    Nasal pain 09/03/2018   Stroke (HCC) 12/2020   See MRI.   Vaginal discharge 01/20/2021   Vaginitis and vulvovaginitis 01/20/2021   Past Surgical History:  Procedure Laterality Date   EYE SURGERY Bilateral    TONSILLECTOMY     Patient Active Problem List   Diagnosis Date Noted   Polyarthralgia 05/29/2023   Chronic fatigue 05/11/2023   Vitamin D deficiency 05/10/2023   Gastroesophageal reflux disease with esophagitis without hemorrhage 05/10/2023   Chronic constipation 11/14/2022   Severe episode of recurrent major depressive disorder (HCC) 09/26/2019   Nasal congestion 09/03/2018   Nonintractable headache 09/03/2018   Arthralgia of both hands 02/28/2018   Uterine leiomyoma 11/30/2017   Symptomatic anemia 08/02/2016    PCP: Dulce Sellar, NP   REFERRING PROVIDER: Richardean Sale, DO   REFERRING DIAG:  M25.50 (ICD-10-CM) - Polyarthralgia  M54.2 (ICD-10-CM) - Neck pain  M54.6,G89.29 (ICD-10-CM) - Chronic bilateral thoracic back pain  M54.50,G89.29 (ICD-10-CM) - Chronic bilateral low back pain without sciatica  M25.562,G89.29 (ICD-10-CM) - Chronic pain of left knee    Rationale for  Evaluation and Treatment: Rehabilitation  THERAPY DIAG:  Polyarthralgia  Neck pain  Chronic bilateral thoracic back pain  Chronic bilateral low back pain without sciatica  Chronic pain of left knee  Fibromyalgia  Muscle weakness (generalized)  ONSET DATE: Pain >  10 years  SUBJECTIVE:                                                                                                                                                                                           SUBJECTIVE STATEMENT:  Pt reports back pain today and also reported falling down on  Saturday and I was walking and My Left leg slipped and I fell down and I had trouble getting up.  I have pain in my Left foot now and it is swollen  (Pt told to see MD to check her foot since she continues to have pain. Pt complains of 8/10 in Left ankle today. Pt with back pain is 7/10 and Left shoulder/neck is 8/10. Pt states she would like to try TPDN today.   EVAL-I have been to doctors and PT but I felt it was not helping me get better. I was caring for my husband with ALS and he passed away 07/01/2016. I was having to carry him.  I have had lots of pain over my neck and shoulders, back and both legs.  I have had depression as well and I am on medication and see a counselor 2 x a week. 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 When I sleep, it takes me time to move my legs to help it feel better from the severe pain. I wake up with medication 3 hours  but without I wake up every 2 hours.   I dont exercise at all.  I don't walk. I tried aquatics but I dont want to do it.  Sometime I don't want to go on living.  I am just so depressed.   PERTINENT HISTORY:   CVA,  anxiety, depression, "anthralgia globally for years". Mild degenerative changes, chronic fatigue  PAIN:    Are you having pain? Yes: NPRS scale: back 7/10  at worst 9/10  neck pain 6/10 at worst 8/10 Pain location: neck, thoracic low back and both knees but left knee  is the worst Pain description: Pain in knees 9-10/10 and sharp achy pain everywhere Aggravating factors: standing up to wash dishes less than 3 minutes and stand to wash in shower or comb  my hair  I feel tired and I want to pass out.  Vacuuming is difficult but my daughter does it, cooking I can only stand for  10 min  Relieving factors: medication and sitting down. I would love to go grocery shopping but my daugher does it for me   PRECAUTIONS: FALLS  RED FLAGS: None   WEIGHT BEARING RESTRICTIONS: No  FALLS:  Has patient fallen in last 6 months? Yes. Number of falls 5 x   one time I passed out and one time on the steps and once in the bathroom, sometimes the medicine makes me feel dizzy and sometimes I feel my legs are week  LIVING ENVIRONMENT: Lives with: lives alone Lives in: House/apartment Stairs: Yes: Internal: 12 steps; can reach both and External: 3 steps; can reach both  Has following equipment at home: Single point cane  OCCUPATION: unemployed  PLOF: Independent  PATIENT GOALS: Since my husband died I have been depressed.  My goals for PT to feel less depressed and to get stronger  NEXT MD VISIT: TBD Screening for Suicide  Answer the following questions with Yes or No and place an "x" beside the action taken.  1. Over the past two weeks, have you felt down, depressed, or hopeless?   YES  2. Within the past two weeks, have you felt little interest or pleasure in life?  YES  If YES to either #1 or #2, then ask #3  3. Have you had thoughts that that life is not worth living or that you might be   YES  better off dead?     If answer is NO and suspicion is low, then end   4. Over this past week, have you had any thoughts about hurting or even killing yourself?  NO  If NO, then end. Patient in no immediate danger   5. If so, do you believe that you intend to or will harm yourself?  NO     If NO, then end. Patient in no immediate danger   6.  Do you have  a plan as to how you would hurt yourself?  NO   7.  Over this past week, have you actually done anything to hurt yourself? NO   IF YES answers to either #4, #5, #6 or #7, then patient is AT RISK for suicide   Actions Taken  ____  Screening negative; no further action required  __x__  Screening positive; no immediate danger and patient already in treatment with a  mental health provider. Advise patient to speak to their mental health provider.  ____  Screening positive; no immediate danger. Patient advised to contact a mental  health provider for further assessment.   ____  Screening positive; in immediate danger as patient states intention of killing self,  has plan and a sense of imminence. Do not leave alone. Seek permission from  patient to contact a family member to inform them. Direct patient to go to ED.  OBJECTIVE:   DIAGNOSTIC FINDINGS:   New Cervical MRI ordered EXAM: MRI CERVICAL SPINE WITHOUT AND WITH CONTRAST   TECHNIQUE: Multiplanar and multiecho pulse sequences of the cervical spine, to include the craniocervical junction and cervicothoracic junction, were obtained without and with intravenous contrast.   CONTRAST:  5mL GADAVIST GADOBUTROL 1 MMOL/ML IV SOLN   COMPARISON:  MRI of the cervical spine 07/07/2021.   FINDINGS: Alignment: Reversal the normal cervical lordosis, similar. Similar trace grade 1 retrolisthesis of C5 on C6.   Vertebrae: Unchanged 5 mm C7 lesion, which remains similar appearance (T2 hyperintense and T1 hypointense). This lesion does enhance.   Cord: Normal cord signal.   Posterior Fossa, vertebral arteries, paraspinal tissues: Visualized vertebral artery flow voids are maintained. No evidence of acute abnormality in the visualized posterior fossa.   Disc levels:   C2-C3: No significant disc protrusion, foraminal stenosis, or canal stenosis.   C3-C4: Small central disc protrusion and mild facet arthropathy without significant  canal or foraminal stenosis. Unchanged.   C4-C5: No significant disc protrusion, foraminal stenosis, or canal stenosis.   C5-C6: Trace grade 1 retrolisthesis. Similar small posterior disc osteophyte complex and facet/uncovertebral hypertrophy. Similar mild resulting bilateral foraminal stenosis. Patent canal. No significant change.   C6-C7: No significant disc protrusion, foraminal stenosis, or canal stenosis.   C7-T1: No significant disc protrusion, foraminal stenosis, or canal stenosis.   IMPRESSION: 1. 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 2. Unchanged multilevel degenerative change, detailed above.     Electronically Signed   By: Feliberto Harts M.D.   On: 07/03/2022 13:27  PATIENT SURVEYS:  FOTO 34% predicted 48%  SCREENING FOR RED FLAGS: Bowel or bladder incontinence: No   COGNITION: Overall cognitive status: Within functional limits for tasks assessed     SENSATION: Pt reports paresthesias bil hands feet  MUSCLE LENGTH: Hamstrings: Right   WNL   POSTURE: rounded shoulders, forward head, and ectomorphic body type  PALPATION: Tender to palpation globally over neck back and hips and knees   CERVICAL ROM:  Pain with  all motions  Active ROM A/PROM (deg) eval  Flexion 45  Extension 40 pain  Right lateral flexion 15  Left lateral flexion 18  Right rotation 40  Left rotation 32   (Blank rows = not tested)    LUMBAR ROM:   AROM eval  Flexion 75%  Extension 75%  Right lateral flexion 75%  Left lateral flexion 75%  Right rotation 50%  Left rotation 50%   (Blank rows = not tested)  LOWER EXTREMITY ROM:   All WFL except otherwise noted  Active  Right eval Left eval  Hip flexion    Hip extension    Hip abduction    Hip adduction    Hip internal rotation 25 38  Hip external rotation 28 45  Knee flexion    Knee extension    Ankle dorsiflexion    Ankle plantarflexion    Ankle  inversion    Ankle eversion     (Blank rows = not tested)  LOWER EXTREMITY MMT:    MMT Right eval Left eval  Hip flexion 3+ 3-  Hip extension 4- 3+  Hip abduction 3 3  Hip adduction    Hip internal rotation    Hip external rotation    Knee flexion 4- 4-  Knee extension 4- 4-  Ankle dorsiflexion    Ankle plantarflexion 3/25 cramping 5/25 cramping  Ankle inversion    Ankle eversion     (Blank rows = not tested)  LUMBAR SPECIAL TESTS:  Straight leg raise test: Negative and Slump test: Negative  FUNCTIONAL TESTS:  5 times sit to stand: 26.81 sec 6 minute walk test: TBD Berg Balance Scale: 46/56 TUG  18 sec 06-13-23  6 MWT  1240 ft (1668ft to 1980 ft) BERG BALANCE  Sitting to Standing: Numbers; 0-4: 4  4. Stands without using hands and stabilize independently  3. Stands independently using hands  2. Stands using hands after multiple trials  1. Min A to stand  0. Mod-Max A to stand Standing unsupported: Numbers; 0-4: 4  4. Stands safely for 2 minutes  3. Stands 2 minutes with supervision  2. Stands 30 seconds unsupported  1. Needs several tries to stand unsupported for 30 seconds  0. Unable to stand unsupported for 30 seconds Sitting unsupported: Numbers; 0-4: 4  4. Sits for 2 minutes independently  3. Sits for 2 minutes with supervision  2. Able to sit 30 seconds  1. Able to sit 10 seconds  0. Unable to sit for 10 seconds Standing to Sitting: Numbers; 0-4: 4 4. Sits safely with minimal use of hands 3. Controls descent with hands 2. Uses back of legs against chair to control descent 1. Sits independently, but uncontrolled descent 0. Needs assistance Transfers: Numbers; 0-4: 4  4. Transfers safely with minor use of hands  3. Transfers safely definite use of hands  2. Transfers with verbal cueing/supervision  1. Needs 1 person assist  0. Needs 2 person assist  Standing with eyes closed: Numbers; 0-4: 4  4. Stands safely for 10 seconds  3. Stands 10 seconds  with supervision   2. Able to stand for 3 seconds  1. Unable to keep eyes closed for 3 seconds, but is safe  0. Needs assist to keep from falling Standing with feet together: Numbers; 0-4: 3 4. Stands for 1 minute safely 3. Stands for 1 minute with supervision 2. Unable to hold for 30 seconds  1. Needs help to attain position but can hold for 15  seconds  0. Needs help to attain position and unable to hold for 15 seconds Reaching forward with outstretched arm: Numbers; 0-4: 4  4. Reaches forward 10 inches  3. Reaches forward 5 inches  2. Reaches forward 2 inches  1. Reaches forward with supervision  0. Loses balance/requires assistace Retrieving object from the floor: Numbers; 0-4: 3 4. Able to pick up easily and safely 3. Able to pick up with supervision 2. Unable to pick up, but reaches within 1-2 inches independently 1. Unable to pick up and needs supervision 0. Unable/needs assistance to keep from falling  Turning to look behind: Numbers; 0-4: 4  4. Looks behind from both sides and weight shifts well  3. Looks behind one side only, other side less weight shift  2. Turns sideways only, maintains balance  1. Needs supervision when turning  0. Needs assistance  Turning 360 degrees: Numbers; 0-4: 4  4. Able to turn in </=4 seconds  3. Able to turn on one side in </= 4 seconds   2. Able to turn slowly, but safely  1. Needs supervision or verbal cueing  0. Needs assistance Place alternate foot on stool: Numbers; 0-4: 3 4. Completes 8 steps in 20 seconds 3. Completes 8 steps in >20 seconds 2. 4 steps without assistance/supervision 1. Completes >2 steps with minimal assist 0. Unable, needs assist to keep from falling Standing with one foot in front: Numbers; 0-4: 0  4. Independent tandem for 30 seconds  3. Independent foot ahead for 30 seconds  2. Independent small step for 30 seconds  1. Needs help to step, but can hold for 15 seconds  0. Loses balance while  standing/stepping Standing on one foot: Numbers; 0-4: 1 4. Holds >10 seconds 3. Holds 5-10 seconds 2. Holds >/=3 seconds  1. Holds <3 seconds 0. Unable   Total Score: 46/56  GAIT: Distance walked: 150 Assistive device utilized: Single point cane Level of assistance: Complete Independence Comments: Slow cadence  decreased hip flexion  decreased arm swing.   TODAY'S TREATMENT:    [facility] Adult PT Treatment:                                                DATE: 06-13-23 6 MWT  1240 ft (1638ft to 1980 ft) Therapeutic Exercise: Shoulder  ER with RTB  3 sets - 10 reps Star pattern with RTB 3 x 10 Supine Pelvic Tilt  1 sets - 10 reps Supine Single Knee to Chest Stretch2 sets - 10 reps Supine LTR1 set  5 hold for 20 sec each Supine Bridge 3 sets - 10 reps  Sit to stand with sink support Movement snack 2 sets - 10 reps Sidelying Hip Abduction  2 x 10 Manual Therapy: STW to bil QL  LAD of Left LE holding at knee only to avoid ankle Trigger Point Dry-Needling performed     by Garen Lah Treatment instructions: Expect mild to moderate muscle soreness. S/S of pneumothorax if dry needled over a lung field, and to seek immediate medical attention should they occur. Patient verbalized understanding of these instructions and education.  Patient Consent Given: Yes Education handout provided: Previously provided Muscles treated: Bil QL Electrical stimulation performed: No Parameters: N/A Treatment response/outcome: twitch response noted, pt noted relief  DATE: EVAL 06-01-23    PATIENT EDUCATION:  Education details: POC Explanation of findings  Person educated: Patient Education method: Explanation and Demonstration Education comprehension: verbalized understanding and returned demonstration  HOME EXERCISE PROGRAM: Access Code: [email or web address] URL:  [email or web address] Date: 06/13/2023 Prepared by: Garen Lah  Exercises - Shoulder External Rotation and Scapular Retraction with Resistance  - 1 x daily - 7 x weekly - 3 sets - 10 reps - Standing Shoulder Horizontal Abduction with Resistance  - 1 x daily - 7 x weekly - 3 sets - 10 reps - Standing Shoulder Diagonal Horizontal Abduction 60/120 Degrees with Resistance  - 1 x daily - 7 x weekly - 3 sets - 10 reps - Supine Pelvic Tilt  - 1 x daily - 7 x weekly - 3 sets - 10 reps - Supine Single Knee to Chest Stretch  - 1 x daily - 7 x weekly - 3 sets - 10 reps - Supine Lower Trunk Rotation  - 1 x daily - 7 x weekly - 1 set  hold for 20 sec each - Supine Bridge  - 1 x daily - 7 x weekly - 3 sets - 10 reps - Sit to stand with sink support Movement snack  - 1 x daily - 7 x weekly - 3 sets - 10 reps - Sidelying Hip Abduction  - 1 x daily - 7 x weekly - 3 sets - 10 reps  ASSESSMENT:  CLINICAL IMPRESSION: Pt presents to clinic for 2nd visit and HEP established and return demo for home use. 6 MWT  1240 ft (1637ft to 1980 ft) Pt consented to TPDN in  back bil QL with decrease in pain. Pt enters clinic reporting fall on Saturday and swollen reported Left foot. Pt told to seek MD evaluation if pain increases and to clear her for further exercise. Pt also wearing slip on shoes and was advised to wear tennis shoes or rubber souled shoes for safety and good grip since she fell and slipped.Pt also encouraged to walk for exercise  Pt HEP issued today.     EVAL-Patient is a 52 y.o. female  with English as second language but communicates well in English who was seen today for physical therapy evaluation and treatment for polyarthralgia with pain in neck. Low back, thoracic spine, bil hips and knees.  Pt has most complaint of knee pain at 10/10 pain  L > R.  All else up to 9/10 pain.  Pt reports . She has experienced death of husband from ALS in 06-19-16 and reports depression and lack of interest  in living but is under care of mental health professionals.  She reports she wants to be able to be stronger and play with grandchild. Pt demonstrates global weakness and has to motivated to participate in more than a few movements at a time. With testing pt is weak throughout LE's  and core along with weakness and discomfort in right shoulder, upper back and cervical area. She reports increasing pain in last 6 months but has had pain for greater than 10 years.  She reports several falls due to dizziness and also with her LE " giving out. Pt with ectomorphic body type. Pt will benefit from skilled PT to address impairments and to increase global strength for better QOL  OBJECTIVE IMPAIRMENTS: decreased activity tolerance, difficulty walking, decreased ROM, decreased strength, dizziness, impaired sensation, improper body mechanics, postural dysfunction, and pain.   ACTIVITY LIMITATIONS: carrying, lifting, bending, sitting, standing, squatting, sleeping, stairs, transfers,  and locomotion level  PARTICIPATION LIMITATIONS: meal prep, cleaning, laundry, driving, and community activity  PERSONAL FACTORS:  CVA,  anxiety, depression, "anthralgia globally for years". Mild degenerative changes, chronic fatigue are also affecting patient's functional outcome.   REHAB POTENTIAL: Fair inconsistent attendance in past therapies.   CLINICAL DECISION MAKING: Evolving/moderate complexity  EVALUATION COMPLEXITY: Moderate   GOALS: Goals reviewed with patient? Yes  SHORT TERM GOALS: Target date: 06-15-23  Pt will  be independent with initial HEP Baseline:no knowledge Goal status: ONGOING  2.  Pt will be able to stand with symmetrical weight in Bil LE's   Baseline:  PT wt bears on Right in standing Goal status:ONGOING  3.  Pt pain will be decreased by 25 % for functional activities Baseline: 10/10 in Eval  Goal status: ONGOING  4.  Pt will begin a home walking program to build strength and  endurance Baseline: sedentary no exercise Goal status:ONGOING  5. Berg Balance score to improve to 52/56 or > to demonstrate improvement in balance and safety   Baseline: 46/56 eval  Goal Status  ONGOING    LONG TERM GOALS: Target date: 07-13-23  Pt will be independent with advanced HEP Baseline: no knowledge Goal status: INITIAL  2.  Pt will be able to  work on household chores 30 minutes Baseline: Unable to stand for 3 min Goal status: INITIAL  3.  Pt will be able to negotiate steps with pain 4/10 or less Baseline:  Goal status: INITIAL  4.  Pt will be able to improve BERG balance Baseline:  Goal status: INITIAL  5.  FOTO will improve from 34%   to  58%   indicating improved functional mobility.  Baseline: eval 34% Goal status: INITIAL  6.  Pt will be improve the 6 MWT to at least 1084ft  in 6 minutes Baseline:  Pt unable to tolerate standing longer than 3 minutes Goal status: INITIAL  PLAN:  PT FREQUENCY: 1-2x/week  PT DURATION: 6 weeks  PLANNED INTERVENTIONS: Therapeutic exercises, Therapeutic activity, Neuromuscular re-education, Balance training, Gait training, Patient/Family education, Self Care, Joint mobilization, Stair training, DME instructions, Dry Needling, Electrical stimulation, Cryotherapy, Moist heat, Taping, Manual therapy, and Re-evaluation.  PLAN FOR NEXT SESSION: issue HEP.    establish walking program  Garen Lah, PT, [facility] Certified Exercise Expert for the Aging Adult  06/13/23 8:51 AM Phone: [phone] Fax: [phone]   Check all possible CPT codes: 44034 - PT Re-evaluation, 97110- Therapeutic Exercise, [phone]- Neuro Re-education, [phone] - Gait Training, [phone] - Manual Therapy, 97530 - Therapeutic Activities, 97535 - Self Care, and [phone] - Physical performance training    Check all conditions that are expected to impact treatment: {Conditions expected to impact treatment:Musculoskeletal disorders and Psychological or  psychiatric disorders   If treatment provided at initial evaluation, no treatment charged due to lack of authorization.

## 2023-06-09 ENCOUNTER — Ambulatory Visit (INDEPENDENT_AMBULATORY_CARE_PROVIDER_SITE_OTHER): Payer: Medicaid Other | Admitting: Family

## 2023-06-09 DIAGNOSIS — Z7689 Persons encountering health services in other specified circumstances: Secondary | ICD-10-CM | POA: Diagnosis not present

## 2023-06-09 DIAGNOSIS — H00015 Hordeolum externum left lower eyelid: Secondary | ICD-10-CM | POA: Diagnosis not present

## 2023-06-09 MED ORDER — ERYTHROMYCIN 5 MG/GM OP OINT
1.0000 | TOPICAL_OINTMENT | Freq: Four times a day (QID) | OPHTHALMIC | 0 refills | Status: AC
Start: 2023-06-09 — End: 2023-06-16

## 2023-06-09 NOTE — Progress Notes (Signed)
Patient ID: Valerie Richards, female    DOB: 05-04-1971, 51 y.o.   MRN: [id number]  Chief Complaint  Patient presents with   Eye Problem    sx for 5d    HPI:      Left eye pain:  Pt c/o left eye pain,redness and swelling since Saturday. Has tried warm compresses.   Assessment & Plan:  1. Hordeolum externum left lower eyelid- sending eye ointment, advised mainly will benefit if stye opens and drains, advised on use & SE. Continue to apply warm, moist compresses 4-5 times per day.  - erythromycin ophthalmic ointment; Place 1 Application into the left eye 4 (four) times daily for 7 days.  Dispense: 28 g; Refill: 0   Subjective:    Outpatient Medications Prior to Visit  Medication Sig Dispense Refill   amitriptyline (ELAVIL) 150 MG tablet Take 1 tablet (150 mg total) by mouth at bedtime. 30 tablet 2   Cholecalciferol (VITAMIN D3) 125 MCG (5000 UT) CAPS Take 1 capsule (5,000 Units total) by mouth daily. 90 capsule 3   clonazePAM (KLONOPIN) 0.5 MG tablet Take 1 tablet (0.5 mg total) by mouth 2 (two) times daily. 60 tablet 2   conjugated estrogens (PREMARIN) vaginal cream Place 0.5 Applicatorfuls vaginally 2 (two) times a week. 42.5 g 1   Fremanezumab-vfrm (AJOVY) 225 MG/1.5ML SOAJ Inject 225 mg into the skin every 28 (twenty-eight) days. 1.68 mL 11   lamoTRIgine (LAMICTAL) 25 MG tablet Take 1 tablet (25 mg total) by mouth daily. 30 tablet 2   LINZESS 290 MCG CAPS capsule Take 1 capsule (290 mcg total) by mouth every morning.     Melatonin 10 MG CAPS Take by mouth.     omeprazole (PRILOSEC) 20 MG capsule Take 1 capsule (20 mg total) by mouth daily. START with 1 capsule bid for 2 weeks, then reduce to qd. 45 capsule 5   ondansetron (ZOFRAN) 4 MG tablet Take 1 tablet (4 mg total) by mouth every 8 (eight) hours as needed for nausea or vomiting. 20 tablet 0   predniSONE (DELTASONE) 10 MG tablet Take one tablet daily for the next 20 days. 20 tablet 0   Rimegepant Sulfate (NURTEC) 75 MG TBDP Take  1 tablet (75 mg total) by mouth as needed (Take one tablet as needed at the earlist onset of a Migraine.  Max 1 tablet in 24 hours). 16 tablet 5   tiZANidine (ZANAFLEX) 4 MG tablet Take 1 tablet (4 mg total) by mouth 3 (three) times daily as needed for muscle spasms. 90 tablet 5   triamcinolone cream (KENALOG) 0.1 % Apply 1 Application topically 2 (two) times daily. 30 g 0   No facility-administered medications prior to visit.   Past Medical History:  Diagnosis Date   Acute recurrent ethmoidal sinusitis 09/03/2018   Anemia    iron deficiency   Anxiety    Depression    Fibroid    Menorrhagia 01/20/2021   Migraine    Nasal pain 09/03/2018   Stroke (HCC) 12/2020   See MRI.   Vaginal discharge 01/20/2021   Vaginitis and vulvovaginitis 01/20/2021   Past Surgical History:  Procedure Laterality Date   EYE SURGERY Bilateral    TONSILLECTOMY     No Known Allergies    Objective:    Physical Exam Vitals and nursing note reviewed.  Constitutional:      Appearance: Normal appearance.  Eyes:     Extraocular Movements: Extraocular movements intact.     Conjunctiva/sclera: Conjunctivae normal.  Comments: Pinpoint Stye on left lower eyelid and mild erythema with puffiness under left eye  Cardiovascular:     Rate and Rhythm: Normal rate and regular rhythm.  Pulmonary:     Effort: Pulmonary effort is normal.     Breath sounds: Normal breath sounds.  Musculoskeletal:        General: Normal range of motion.  Skin:    General: Skin is warm and dry.  Neurological:     Mental Status: She is alert.  Psychiatric:        Mood and Affect: Mood normal.        Behavior: Behavior normal.    BP 105/69   Pulse 63   Temp 97.7 F (36.5 C) (Temporal)   Ht 5\' 5"  (1.651 m)   Wt 118 lb 3.2 oz (53.6 kg)   LMP 02/26/2020 (Exact Date)   SpO2 100%   BMI 19.67 kg/m  Wt Readings from Last 3 Encounters:  06/09/23 118 lb 3.2 oz (53.6 kg)  05/29/23 119 lb 9.6 oz (54.3 kg)  05/16/23 120 lb  (54.4 kg)       Dulce Sellar, NP

## 2023-06-11 DIAGNOSIS — Z419 Encounter for procedure for purposes other than remedying health state, unspecified: Secondary | ICD-10-CM | POA: Diagnosis not present

## 2023-06-13 ENCOUNTER — Ambulatory Visit: Payer: Medicaid Other | Attending: Sports Medicine | Admitting: Physical Therapy

## 2023-06-13 ENCOUNTER — Encounter: Payer: Self-pay | Admitting: Physical Therapy

## 2023-06-13 DIAGNOSIS — M546 Pain in thoracic spine: Secondary | ICD-10-CM | POA: Insufficient documentation

## 2023-06-13 DIAGNOSIS — M25562 Pain in left knee: Secondary | ICD-10-CM | POA: Diagnosis not present

## 2023-06-13 DIAGNOSIS — M542 Cervicalgia: Secondary | ICD-10-CM | POA: Insufficient documentation

## 2023-06-13 DIAGNOSIS — G8929 Other chronic pain: Secondary | ICD-10-CM | POA: Diagnosis not present

## 2023-06-13 DIAGNOSIS — M2550 Pain in unspecified joint: Secondary | ICD-10-CM | POA: Insufficient documentation

## 2023-06-13 DIAGNOSIS — M6281 Muscle weakness (generalized): Secondary | ICD-10-CM | POA: Diagnosis not present

## 2023-06-13 DIAGNOSIS — Z7689 Persons encountering health services in other specified circumstances: Secondary | ICD-10-CM | POA: Diagnosis not present

## 2023-06-13 DIAGNOSIS — M545 Low back pain, unspecified: Secondary | ICD-10-CM | POA: Diagnosis not present

## 2023-06-13 DIAGNOSIS — M797 Fibromyalgia: Secondary | ICD-10-CM | POA: Diagnosis not present

## 2023-06-13 NOTE — Patient Instructions (Addendum)
Trigger Point Dry Needling  What is Trigger Point Dry Needling (DN)? DN is a physical therapy technique used to treat muscle pain and dysfunction. Specifically, DN helps deactivate muscle trigger points (muscle knots).  A thin filiform needle is used to penetrate the skin and stimulate the underlying trigger point. The goal is for a local twitch response (LTR) to occur and for the trigger point to relax. No medication of any kind is injected during the procedure.   What Does Trigger Point Dry Needling Feel Like?  The procedure feels different for each individual patient. Some patients report that they do not actually feel the needle enter the skin and overall the process is not painful. Very mild bleeding may occur. However, many patients feel a deep cramping in the muscle in which the needle was inserted. This is the local twitch response.   How Will I feel after the treatment? Soreness is normal, and the onset of soreness may not occur for a few hours. Typically this soreness does not last longer than two days.  Bruising is uncommon, however; ice can be used to decrease any possible bruising.  In rare cases feeling tired or nauseous after the treatment is normal. In addition, your symptoms may get worse before they get better, this period will typically not last longer than 24 hours.   What Can I do After My Treatment? Increase your hydration by drinking more water for the next 24 hours. You may place ice or heat on the areas treated that have become sore, however, do not use heat on inflamed or bruised areas. Heat often brings more relief post needling. You can continue your regular activities, but vigorous activity is not recommended initially after the treatment for 24 hours. DN is best combined with other physical therapy such as strengthening, stretching, and other therapies.     Access Code: [email or web address] URL: [email or web address] Date: 06/13/2023 Prepared by: Garen Lah  Exercises - Shoulder External Rotation and Scapular Retraction with Resistance  - 1 x daily - 7 x weekly - 3 sets - 10 reps - Standing Shoulder Horizontal Abduction with Resistance  - 1 x daily - 7 x weekly - 3 sets - 10 reps - Standing Shoulder Diagonal Horizontal Abduction 60/120 Degrees with Resistance  - 1 x daily - 7 x weekly - 3 sets - 10 reps - Supine Pelvic Tilt  - 1 x daily - 7 x weekly - 3 sets - 10 reps - Supine Single Knee to Chest Stretch  - 1 x daily - 7 x weekly - 3 sets - 10 reps - Supine Lower Trunk Rotation  - 1 x daily - 7 x weekly - 3 sets - 10 reps - Supine Bridge  - 1 x daily - 7 x weekly - 3 sets - 10 reps - Sit to stand with sink support Movement snack  - 1 x daily - 7 x weekly - 3 sets - 10 reps - Sidelying Hip Abduction  - 1 x daily - 7 x weekly - 3 sets - 10 reps Access Code: [email or web address] URL: [email or web address] Date: 06/13/2023 Prepared by: Garen Lah  Exercises - Shoulder External Rotation and Scapular Retraction with Resistance  - 1 x daily - 7 x weekly - 3 sets - 10 reps - Standing Shoulder Horizontal Abduction with Resistance  - 1 x daily - 7 x weekly - 3 sets - 10 reps - Standing Shoulder Diagonal Horizontal Abduction 60/120 Degrees with Resistance  - 1 x daily -  7 x weekly - 3 sets - 10 reps - Supine Pelvic Tilt  - 1 x daily - 7 x weekly - 3 sets - 10 reps - Supine Single Knee to Chest Stretch  - 1 x daily - 7 x weekly - 3 sets - 10 reps - Supine Lower Trunk Rotation  - 1 x daily - 7 x weekly - 3 sets - 10 reps - Supine Bridge  - 1 x daily - 7 x weekly - 3 sets - 10 reps - Sit to stand with sink support Movement snack  - 1 x daily - 7 x weekly - 3 sets - 10 reps - Sidelying Hip Abduction  - 1 x daily - 7 x weekly - 3 sets - 10 reps Garen Lah, PT, [employer] Certified Exercise Expert for the Aging Adult  06/13/23 8:09 AM Phone: [phone] Fax: [phone]

## 2023-06-14 DIAGNOSIS — F4000 Agoraphobia, unspecified: Secondary | ICD-10-CM | POA: Diagnosis not present

## 2023-06-14 DIAGNOSIS — Z7689 Persons encountering health services in other specified circumstances: Secondary | ICD-10-CM | POA: Diagnosis not present

## 2023-06-14 DIAGNOSIS — F333 Major depressive disorder, recurrent, severe with psychotic symptoms: Secondary | ICD-10-CM | POA: Diagnosis not present

## 2023-06-19 DIAGNOSIS — F333 Major depressive disorder, recurrent, severe with psychotic symptoms: Secondary | ICD-10-CM | POA: Diagnosis not present

## 2023-06-19 DIAGNOSIS — Z7689 Persons encountering health services in other specified circumstances: Secondary | ICD-10-CM | POA: Diagnosis not present

## 2023-06-19 DIAGNOSIS — F4000 Agoraphobia, unspecified: Secondary | ICD-10-CM | POA: Diagnosis not present

## 2023-06-21 NOTE — Progress Notes (Unsigned)
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  Would not use Aimovig as she already has significant constipation.   For trigeminal neuralgia, restart oxcarbazepine 150mg  BID.  Check BMP in 4 weeks Migraine rescue:  Refill Nurtec  Tizanidine 4mg  up to 3 times daily as needed for neck pain Repeat MRI of C-spine to follow up on vertebral body lesion. Follow up 4 months.     Subjective:  Valerie Richards is a 52 year old female with history of iron-deficiency anemia who follows up for migraine and cervical radiculopathy   UPDATE: Last visit, started patient on Ajovy for migraine prevention and advised to restart oxcarbazepine for trigeminal neuralgia.  She discontinued those medications herself *** *** Intensity:  moderate to severe Duration:  5-6 hours Frequency:  16 days a month (50% are severe)   Current medications: Current NSAIDs/analgesics:  paracetamol (acetaminophen) Current antidepressant:  amitriptyline 150mg  at bedtime (prescribed by psychiatry) Current antiepileptic:  lamotrigine 25mg  daily (psychiatry) Current CGRP inhibitor:  Ajovy Muscle relaxant/neck pain:  tizanidine 2-4mg  at bedtime  03/27/2023 CMP:  Na 139, K 3.6, Cl 106, CO2 26, glucose 98, BUN 24, Cr 0.60, t bili 0.8, ALP 59, AST 20, ALT 17    HISTORY: Migraine: Beginning around September 2021, she developed new onset headaches.  They are severe right frontal pounding headaches associated with nausea, photophobia and sometimes phonophobia.  They sometimes wake her up at night from sleep.  It lasts 2 hours with Tylenol.  They occur every other day.  No known triggers.    Past medication:  topiramate, propranolol (hypotension), duloxetine, Nurtec PRN  (effective/not covered)  Dizziness: Around this same time, she started having dizziness.  It is positional.  It is a spinning sensation lasting seconds to a couple of minutes.  Associated with blurred vision and sometimes nausea.  Occurs every other day to twice a day.    Cervical spondylosis with radiculopathy: She has chronic neck pain radiating into the right shoulder.  X-ray cervical spine from 06/17/2020 personally reviewed showed degenerative straightening of the cervical lordosis with focally moderate disc space height loss and osteophytosis of C6-C7.  She then started endorsing aching pain on the left side of her neck and arm, sometimes leg.  Also left eye twitches.  Pain in the left knee and ankle.  Reports numbness and tingling in fingers and toes, particularly when she is feeling anxious. Did not respond to PT.  MRI of C-spine on 07/07/2021  showed cervical spondylosis with with minimal spinal stenosis at C3-4 and mild bilateral neural foraminal narrowing at C5-6.  5 mm lesion within C7 vertebral body noted, nonspecific but may reflect an atypical hemangioma.   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  Right-sided Trigeminal Neuralgia: Beginning in September 2023, she reports paroxsymal shooting pain in the right V2-V3 distribution with right sided facial numbness.  Occurs every other day.     MRI brain with and without contrast on 12/31/2020 showed paranasal sinus disease and 3 mm chronic right cerebellar infarct but no acute abnormality.  Advised to start ASA but stopped because it caused bruising.    PAST MEDICAL HISTORY: Past Medical History:  Diagnosis Date   Acute recurrent ethmoidal sinusitis  09/03/2018   Anemia    iron deficiency   Anxiety    Depression    Fibroid    Menorrhagia 01/20/2021   Migraine    Nasal pain 09/03/2018   Stroke (HCC) 12/2020   See MRI.   Vaginal  discharge 01/20/2021   Vaginitis and vulvovaginitis 01/20/2021    MEDICATIONS: Current Outpatient Medications on File Prior to Visit  Medication Sig Dispense Refill   amitriptyline (ELAVIL) 150 MG tablet Take 1 tablet (150 mg total) by mouth at bedtime. 30 tablet 2   Cholecalciferol (VITAMIN D3) 125 MCG (5000 UT) CAPS Take 1 capsule (5,000 Units total) by mouth daily. 90 capsule 3   clonazePAM (KLONOPIN) 0.5 MG tablet Take 1 tablet (0.5 mg total) by mouth 2 (two) times daily. 60 tablet 2   conjugated estrogens (PREMARIN) vaginal cream Place 0.5 Applicatorfuls vaginally 2 (two) times a week. 42.5 g 1   Fremanezumab-vfrm (AJOVY) 225 MG/1.5ML SOAJ Inject 225 mg into the skin every 28 (twenty-eight) days. 1.68 mL 11   lamoTRIgine (LAMICTAL) 25 MG tablet Take 1 tablet (25 mg total) by mouth daily. 30 tablet 2   LINZESS 290 MCG CAPS capsule Take 1 capsule (290 mcg total) by mouth every morning.     Melatonin 10 MG CAPS Take by mouth.     omeprazole (PRILOSEC) 20 MG capsule Take 1 capsule (20 mg total) by mouth daily. START with 1 capsule bid for 2 weeks, then reduce to qd. 45 capsule 5   ondansetron (ZOFRAN) 4 MG tablet Take 1 tablet (4 mg total) by mouth every 8 (eight) hours as needed for nausea or vomiting. 20 tablet 0   predniSONE (DELTASONE) 10 MG tablet Take one tablet daily for the next 20 days. 20 tablet 0   Rimegepant Sulfate (NURTEC) 75 MG TBDP Take 1 tablet (75 mg total) by mouth as needed (Take one tablet as needed at the earlist onset of a Migraine.  Max 1 tablet in 24 hours). 16 tablet 5   tiZANidine (ZANAFLEX) 4 MG tablet Take 1 tablet (4 mg total) by mouth 3 (three) times daily as needed for muscle spasms. 90 tablet 5   triamcinolone cream (KENALOG) 0.1 % Apply 1 Application topically 2 (two) times daily. 30 g 0   No current facility-administered medications on file prior to visit.    ALLERGIES: No Known Allergies  FAMILY HISTORY: Family History  Problem Relation Age of Onset    Hypertension Mother    Cancer Father    Diabetes Father    Colon cancer Father    Kidney failure Father    Diabetes Brother       Objective:  Blood pressure 101/63, pulse 61, height 5\' 5"  (1.651 m), weight 118 lb 6.4 oz (53.7 kg), last menstrual period 02/26/2020, SpO2 100 %. General: No acute distress.  Patient appears well-groomed.     Shon Millet, DO  CC: Maryelizabeth Rowan, MD

## 2023-06-22 ENCOUNTER — Encounter: Payer: Self-pay | Admitting: Neurology

## 2023-06-22 ENCOUNTER — Ambulatory Visit (INDEPENDENT_AMBULATORY_CARE_PROVIDER_SITE_OTHER): Payer: Medicaid Other | Admitting: Neurology

## 2023-06-22 VITALS — BP 85/55 | HR 69 | Resp 18 | Ht 64.0 in | Wt 121.0 lb

## 2023-06-22 DIAGNOSIS — G43009 Migraine without aura, not intractable, without status migrainosus: Secondary | ICD-10-CM

## 2023-06-22 DIAGNOSIS — M79602 Pain in left arm: Secondary | ICD-10-CM | POA: Diagnosis not present

## 2023-06-22 DIAGNOSIS — M5412 Radiculopathy, cervical region: Secondary | ICD-10-CM | POA: Diagnosis not present

## 2023-06-22 DIAGNOSIS — Z7689 Persons encountering health services in other specified circumstances: Secondary | ICD-10-CM | POA: Diagnosis not present

## 2023-06-22 DIAGNOSIS — G500 Trigeminal neuralgia: Secondary | ICD-10-CM

## 2023-06-22 MED ORDER — AJOVY 225 MG/1.5ML ~~LOC~~ SOAJ: 225.0000 mg | SUBCUTANEOUS | 11 refills | Status: DC

## 2023-06-22 NOTE — Patient Instructions (Signed)
Continue Ajovy injection every 4 weeks When you get a migraine, try Ubrelvy.  May repeat after 2 hours.  Maximum 2 tablets in 24 hours.  Let me know if you like this or prefer Nurtec Nerve study of left upper extremity Follow up 6 months.

## 2023-06-26 DIAGNOSIS — F4000 Agoraphobia, unspecified: Secondary | ICD-10-CM | POA: Diagnosis not present

## 2023-06-26 DIAGNOSIS — Z7689 Persons encountering health services in other specified circumstances: Secondary | ICD-10-CM | POA: Diagnosis not present

## 2023-06-26 DIAGNOSIS — F333 Major depressive disorder, recurrent, severe with psychotic symptoms: Secondary | ICD-10-CM | POA: Diagnosis not present

## 2023-06-26 NOTE — Progress Notes (Deleted)
Aleen Sells D.[facility] [address] [place] [address] Phone: [phone]   Assessment and Plan:     There are no diagnoses linked to this encounter.  ***   Pertinent previous records reviewed include ***   Follow Up: ***     Subjective:   I, Shant Hence, am serving as a Neurosurgeon for Doctor Richardean Sale   Chief Complaint: pain all over    HPI:    03/14/2023 Patient is a 52 year old female complaining of pain all over. Patient states when she saw PCP Overall "not feeling good". Pain all over. Neck, knees and legs bad to walk and wake her up. Takes tylenol and that does not seem to help . Cramps in legs and feet can "curl". Some joint pain and some cramps day or night. Upper arms/shoulders, etc hurt. Doesn't do a lot to cause pain. When gets up from sitting, hard to get going. Morning stiffness as well. Foot can slide out from her. Feels like some weakness(will have to pick up legs at times). Knees and feet can be burning or numb/tingle. When walking, feels like "needle sticking me in female area". This has been going on for years but the pain has been getting worse over the past couple of weeks. She has had lab work and it shows nothing. She feels like her toes are cramping all the time her hands are weak.     05/16/2023 Patient states that she feels more pain allover her back , shoulder and her feet she sees bones sticking out . Right knee has pain in the night time the meloxicam only helped a little    06/27/2023 Patient states  Relevant Historical Information: MDD with patient's husband passing from [facility] disease  Additional pertinent review of systems negative.   Current Outpatient Medications:    amitriptyline (ELAVIL) 150 MG tablet, Take 1 tablet (150 mg total) by mouth at bedtime., Disp: 30 tablet, Rfl: 2   Cholecalciferol (VITAMIN D3) 125 MCG (5000 UT) CAPS, Take 1 capsule (5,000 Units total) by mouth daily., Disp:  90 capsule, Rfl: 3   clonazePAM (KLONOPIN) 0.5 MG tablet, Take 1 tablet (0.5 mg total) by mouth 2 (two) times daily., Disp: 60 tablet, Rfl: 2   conjugated estrogens (PREMARIN) vaginal cream, Place 0.5 Applicatorfuls vaginally 2 (two) times a week., Disp: 42.5 g, Rfl: 1   Fremanezumab-vfrm (AJOVY) 225 MG/1.5ML SOAJ, Inject 225 mg into the skin every 28 (twenty-eight) days., Disp: 1.68 mL, Rfl: 11   lamoTRIgine (LAMICTAL) 25 MG tablet, Take 1 tablet (25 mg total) by mouth daily., Disp: 30 tablet, Rfl: 2   LINZESS 290 MCG CAPS capsule, Take 1 capsule (290 mcg total) by mouth every morning., Disp: , Rfl:    Melatonin 10 MG CAPS, Take by mouth., Disp: , Rfl:    omeprazole (PRILOSEC) 20 MG capsule, Take 1 capsule (20 mg total) by mouth daily. START with 1 capsule bid for 2 weeks, then reduce to qd., Disp: 45 capsule, Rfl: 5   ondansetron (ZOFRAN) 4 MG tablet, Take 1 tablet (4 mg total) by mouth every 8 (eight) hours as needed for nausea or vomiting., Disp: 20 tablet, Rfl: 0   predniSONE (DELTASONE) 10 MG tablet, Take one tablet daily for the next 20 days. (Patient not taking: Reported on 06/22/2023), Disp: 20 tablet, Rfl: 0   Rimegepant Sulfate (NURTEC) 75 MG TBDP, Take 1 tablet (75 mg total) by mouth as needed (Take one tablet  as needed at the earlist onset of a Migraine.  Max 1 tablet in 24 hours)., Disp: 16 tablet, Rfl: 5   tiZANidine (ZANAFLEX) 4 MG tablet, Take 1 tablet (4 mg total) by mouth 3 (three) times daily as needed for muscle spasms., Disp: 90 tablet, Rfl: 5   triamcinolone cream (KENALOG) 0.1 %, Apply 1 Application topically 2 (two) times daily., Disp: 30 g, Rfl: 0   Objective:     There were no vitals filed for this visit.    There is no height or weight on file to calculate BMI.    Physical Exam:    ***   Electronically signed by:  Aleen Sells D.[facility] 7:05 AM 06/26/23

## 2023-06-27 ENCOUNTER — Ambulatory Visit: Payer: BLUE CROSS/BLUE SHIELD | Admitting: Sports Medicine

## 2023-06-29 ENCOUNTER — Ambulatory Visit: Payer: Medicaid Other | Admitting: Physical Therapy

## 2023-07-10 ENCOUNTER — Ambulatory Visit: Payer: Medicaid Other | Admitting: Physical Therapy

## 2023-07-11 DIAGNOSIS — Z419 Encounter for procedure for purposes other than remedying health state, unspecified: Secondary | ICD-10-CM | POA: Diagnosis not present

## 2023-07-12 DIAGNOSIS — F333 Major depressive disorder, recurrent, severe with psychotic symptoms: Secondary | ICD-10-CM | POA: Diagnosis not present

## 2023-07-12 DIAGNOSIS — F4000 Agoraphobia, unspecified: Secondary | ICD-10-CM | POA: Diagnosis not present

## 2023-07-13 NOTE — Therapy (Addendum)
OUTPATIENT PHYSICAL THERAPY THORACOLUMBAR TREATMENT//Recertification/Discharge NOTE PHYSICAL THERAPY DISCHARGE SUMMARY  Visits from Start of Care: 3  Current functional level related to goals / functional outcomes: unknown   Remaining deficits: As reported by telephone encounter by interpreter.  Pt is too drowsy to participate in PT at this time.  Pt is unable to participate in home maintenance and feels like she cannot be consistant at this time with PT Education / Equipment: Initial HEP   Patient agrees to discharge. Patient goals were not met. Patient is being discharged due to not returning since the last visit.  And pt has had 8 CX and unable to participate fully in PT at this time.  Will return to MD for further evaluation.    Patient Name: Valerie Richards MRN: [id number] DOB:October 24, 1970, 52 y.o., female Today's Date: 07/14/2023  END OF SESSION:  PT End of Session - 07/14/23 0841     Visit Number 3    Number of Visits 12    Date for PT Re-Evaluation 08/25/23    Authorization Type [facility]    Authorization - Visit Number 2   beginning 06-12-23   Authorization - Number of Visits 10    PT Start Time 0845    PT Stop Time 0930    PT Time Calculation (min) 45 min    Activity Tolerance Patient tolerated treatment well;Patient limited by fatigue;Patient limited by pain    Behavior During Therapy [facility] for tasks assessed/performed               Past Medical History:  Diagnosis Date   Acute recurrent ethmoidal sinusitis 09/03/2018   Anemia    iron deficiency   Anxiety    Depression    Fibroid    Menorrhagia 01/20/2021   Migraine    Nasal pain 09/03/2018   Stroke (HCC) 12/2020   See MRI.   Vaginal discharge 01/20/2021   Vaginitis and vulvovaginitis 01/20/2021   Past Surgical History:  Procedure Laterality Date   EYE SURGERY Bilateral    TONSILLECTOMY     Patient Active Problem List   Diagnosis Date Noted   Polyarthralgia 05/29/2023   Chronic fatigue  05/11/2023   Vitamin D deficiency 05/10/2023   Gastroesophageal reflux disease with esophagitis without hemorrhage 05/10/2023   Chronic constipation 11/14/2022   Severe episode of recurrent major depressive disorder (HCC) 09/26/2019   Nasal congestion 09/03/2018   Nonintractable headache 09/03/2018   Arthralgia of both hands 02/28/2018   Uterine leiomyoma 11/30/2017   Symptomatic anemia 08/02/2016    PCP: Dulce Sellar, NP   REFERRING PROVIDER: Richardean Sale, DO   REFERRING DIAG:  M25.50 (ICD-10-CM) - Polyarthralgia  M54.2 (ICD-10-CM) - Neck pain  M54.6,G89.29 (ICD-10-CM) - Chronic bilateral thoracic back pain  M54.50,G89.29 (ICD-10-CM) - Chronic bilateral low back pain without sciatica  M25.562,G89.29 (ICD-10-CM) - Chronic pain of left knee    Rationale for Evaluation and Treatment: Rehabilitation  THERAPY DIAG:  Polyarthralgia  Neck pain  Chronic bilateral thoracic back pain  Chronic bilateral low back pain without sciatica  Chronic pain of left knee  Fibromyalgia  Muscle weakness (generalized)  Other low back pain  ONSET DATE: Pain >  10 years  SUBJECTIVE:  SUBJECTIVE STATEMENT: Pt returns today and has not attended PT due to feeling too bad.  Pt complains of Left knee pain when walking.  She reports she knows she needs to be more consistent with exercises and walking.  Pt is currently seeing an MD and may have an muscle EMG.    EVAL-I have been to doctors and PT but I felt it was not helping me get better. I was caring for my husband with ALS and he passed away September 06, 2016. I was having to carry him.  I have had lots of pain over my neck and shoulders, back and both legs.  I have had depression as well and I am on medication and see a counselor 2 x a week. 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 When I sleep, it takes me time to move my legs to help it feel better from the severe pain. I wake up with medication 3 hours  but without I wake up every 2 hours.   I dont exercise at all.  I don't walk. I tried aquatics but I dont want to do it.  Sometime I don't want to go on living.  I am just so depressed.   PERTINENT HISTORY:   CVA,  anxiety, depression, "anthralgia globally for years". Mild degenerative changes, chronic fatigue  PAIN:    Are you having pain? Yes: NPRS scale: back 7/10  at worst 9/10  neck pain 6/10 at worst 8/10 Pain location: neck, thoracic low back and both knees but left knee is the worst Pain description: Pain in knees 9-10/10 and sharp achy pain everywhere Aggravating factors: standing up to wash dishes less than 3 minutes and stand to wash in shower or comb  my hair  I feel tired and I want to pass out.  Vacuuming is difficult but my daughter does it, cooking I can only stand for  10 min  Relieving factors: medication and sitting down. I would love to go grocery shopping but my daugher does it for me   PRECAUTIONS: FALLS  RED FLAGS: None   WEIGHT BEARING RESTRICTIONS: No  FALLS:  Has patient fallen in last 6 months? Yes. Number of falls 5 x   one time I passed out and one time on the steps and once in the bathroom, sometimes the medicine makes me feel dizzy and sometimes I feel my legs are week  LIVING ENVIRONMENT: Lives with: lives alone Lives in: House/apartment Stairs: Yes: Internal: 12 steps; can reach both and External: 3 steps; can reach both  Has following equipment at home: Single point cane  OCCUPATION: unemployed  PLOF: Independent  PATIENT GOALS: Since my husband died I have been depressed.  My goals for PT to feel less depressed and to get stronger  NEXT MD VISIT: TBD Screening for Suicide  Answer the following questions with Yes or No and place an "x" beside the action taken.  1. Over the past two  weeks, have you felt down, depressed, or hopeless?   YES  2. Within the past two weeks, have you felt little interest or pleasure in life?  YES  If YES to either #1 or #2, then ask #3  3. Have you had thoughts that that life is not worth living or that you might be   YES     better off dead?     If answer is NO and suspicion is low, then end   4. Over this past  week, have you had any thoughts about hurting or even killing yourself?  NO  If NO, then end. Patient in no immediate danger   5. If so, do you believe that you intend to or will harm yourself?  NO     If NO, then end. Patient in no immediate danger   6.  Do you have a plan as to how you would hurt yourself?  NO   7.  Over this past week, have you actually done anything to hurt yourself? NO   IF YES answers to either #4, #5, #6 or #7, then patient is AT RISK for suicide   Actions Taken  ____  Screening negative; no further action required  __x__  Screening positive; no immediate danger and patient already in treatment with a  mental health provider. Advise patient to speak to their mental health provider.  ____  Screening positive; no immediate danger. Patient advised to contact a mental  health provider for further assessment.   ____  Screening positive; in immediate danger as patient states intention of killing self,  has plan and a sense of imminence. Do not leave alone. Seek permission from  patient to contact a family member to inform them. Direct patient to go to ED.  OBJECTIVE:   DIAGNOSTIC FINDINGS:   New Cervical MRI ordered EXAM: MRI CERVICAL SPINE WITHOUT AND WITH CONTRAST   TECHNIQUE: Multiplanar and multiecho pulse sequences of the cervical spine, to include the craniocervical junction and cervicothoracic junction, were obtained without and with intravenous contrast.   CONTRAST:  5mL GADAVIST GADOBUTROL 1 MMOL/ML IV SOLN   COMPARISON:  MRI of the cervical spine 07/07/2021.    FINDINGS: Alignment: Reversal the normal cervical lordosis, similar. Similar trace grade 1 retrolisthesis of C5 on C6.   Vertebrae: Unchanged 5 mm C7 lesion, which remains similar appearance (T2 hyperintense and T1 hypointense). This lesion does enhance.   Cord: Normal cord signal.   Posterior Fossa, vertebral arteries, paraspinal tissues: Visualized vertebral artery flow voids are maintained. No evidence of acute abnormality in the visualized posterior fossa.   Disc levels:   C2-C3: No significant disc protrusion, foraminal stenosis, or canal stenosis.   C3-C4: Small central disc protrusion and mild facet arthropathy without significant canal or foraminal stenosis. Unchanged.   C4-C5: No significant disc protrusion, foraminal stenosis, or canal stenosis.   C5-C6: Trace grade 1 retrolisthesis. Similar small posterior disc osteophyte complex and facet/uncovertebral hypertrophy. Similar mild resulting bilateral foraminal stenosis. Patent canal. No significant change.   C6-C7: No significant disc protrusion, foraminal stenosis, or canal stenosis.   C7-T1: No significant disc protrusion, foraminal stenosis, or canal stenosis.   IMPRESSION: 1. 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 2. Unchanged multilevel degenerative change, detailed above.     Electronically Signed   By: Feliberto Harts M.D.   On: 07/03/2022 13:27  PATIENT SURVEYS:  FOTO 34% predicted 48% 07-14-23 50%  SCREENING FOR RED FLAGS: Bowel or bladder incontinence: No   COGNITION: Overall cognitive status: Within functional limits for tasks assessed     SENSATION: Pt reports paresthesias bil hands feet  MUSCLE LENGTH: Hamstrings: Right   WNL   POSTURE: rounded shoulders, forward head, and ectomorphic body type  PALPATION: Tender to palpation globally over neck back and hips and knees   CERVICAL ROM:  Pain with  all  motions  Active ROM A/PROM (deg) eval AROM 07-14-23  Flexion 45 45  Extension 40 pain 45 tightness  Right lateral flexion 15 20  Left lateral flexion 18 24  Right rotation 40 55  Left rotation 32 39 pain   (Blank rows = not tested)    LUMBAR ROM:   AROM eval 07-14-23  Flexion 75% 80%  Extension 75% 75%  Right lateral flexion 75% 75%  Left lateral flexion 75% 75%  Right rotation 50% 50%  Left rotation 50% 50%   (Blank rows = not tested)  LOWER EXTREMITY ROM:   All WFL except otherwise noted  Active  Right eval Left eval R/L 07-14-23  Hip flexion     Hip extension     Hip abduction     Hip adduction     Hip internal rotation 25 38 25/40  Hip external rotation 28 45 30/45  Knee flexion     Knee extension     Ankle dorsiflexion     Ankle plantarflexion     Ankle inversion     Ankle eversion      (Blank rows = not tested)  LOWER EXTREMITY MMT:    MMT Right eval Left eval R/L 07-14-23  Hip flexion 3+ 3- 4/3-  Hip extension 4- 3+ 4-/3+  Hip abduction 3 3 3/3  Hip adduction     Hip internal rotation     Hip external rotation     Knee flexion 4- 4- 4-/4  Knee extension 4- 4- 4-/4-  Ankle dorsiflexion     Ankle plantarflexion 3/25 cramping 5/25 cramping 25/20 fatigued  Ankle inversion     Ankle eversion      (Blank rows = not tested)  LUMBAR SPECIAL TESTS:  Straight leg raise test: Negative and Slump test: Negative  FUNCTIONAL TESTS:  5 times sit to stand: 26.81 sec 6 minute walk test: TBD Berg Balance Scale: 46/56 TUG  18 sec 07-14-23  10.61 sec 06-13-23  6 MWT  1240 ft (1655ft to 1980 ft) 07-14-23  1348.33ft  ( Norm 1624ft to 1980 ft) 5x STS 18.92 BERG BALANCE  Sitting to Standing: Numbers; 0-4: 4  4. Stands without using hands and stabilize independently  3. Stands independently using hands  2. Stands using hands after multiple trials  1. Min A to stand  0. Mod-Max A to stand Standing unsupported: Numbers; 0-4: 4  4. Stands safely for 2  minutes  3. Stands 2 minutes with supervision  2. Stands 30 seconds unsupported  1. Needs several tries to stand unsupported for 30 seconds  0. Unable to stand unsupported for 30 seconds Sitting unsupported: Numbers; 0-4: 4  4. Sits for 2 minutes independently  3. Sits for 2 minutes with supervision  2. Able to sit 30 seconds  1. Able to sit 10 seconds  0. Unable to sit for 10 seconds Standing to Sitting: Numbers; 0-4: 4 4. Sits safely with minimal use of hands 3. Controls descent with hands 2. Uses back of legs against chair to control descent 1. Sits independently, but uncontrolled descent 0. Needs assistance Transfers: Numbers; 0-4: 4  4. Transfers safely with minor use of hands  3. Transfers safely definite use of hands  2. Transfers with verbal cueing/supervision  1. Needs 1 person assist  0. Needs 2 person assist  Standing with eyes closed: Numbers; 0-4: 4  4. Stands safely for 10 seconds  3. Stands 10 seconds with supervision   2. Able to stand for 3 seconds  1. Unable to keep eyes closed for 3 seconds, but is safe  0. Needs assist to keep  from falling Standing with feet together: Numbers; 0-4: 3 4. Stands for 1 minute safely 3. Stands for 1 minute with supervision 2. Unable to hold for 30 seconds  1. Needs help to attain position but can hold for 15 seconds  0. Needs help to attain position and unable to hold for 15 seconds Reaching forward with outstretched arm: Numbers; 0-4: 4  4. Reaches forward 10 inches  3. Reaches forward 5 inches  2. Reaches forward 2 inches  1. Reaches forward with supervision  0. Loses balance/requires assistace Retrieving object from the floor: Numbers; 0-4: 3 4. Able to pick up easily and safely 3. Able to pick up with supervision 2. Unable to pick up, but reaches within 1-2 inches independently 1. Unable to pick up and needs supervision 0. Unable/needs assistance to keep from falling  Turning to look behind: Numbers; 0-4: 4  4.  Looks behind from both sides and weight shifts well  3. Looks behind one side only, other side less weight shift  2. Turns sideways only, maintains balance  1. Needs supervision when turning  0. Needs assistance  Turning 360 degrees: Numbers; 0-4: 4  4. Able to turn in </=4 seconds  3. Able to turn on one side in </= 4 seconds   2. Able to turn slowly, but safely  1. Needs supervision or verbal cueing  0. Needs assistance Place alternate foot on stool: Numbers; 0-4: 3 4. Completes 8 steps in 20 seconds 3. Completes 8 steps in >20 seconds 2. 4 steps without assistance/supervision 1. Completes >2 steps with minimal assist 0. Unable, needs assist to keep from falling Standing with one foot in front: Numbers; 0-4: 0  4. Independent tandem for 30 seconds  3. Independent foot ahead for 30 seconds  2. Independent small step for 30 seconds  1. Needs help to step, but can hold for 15 seconds  0. Loses balance while standing/stepping Standing on one foot: Numbers; 0-4: 1 4. Holds >10 seconds 3. Holds 5-10 seconds 2. Holds >/=3 seconds  1. Holds <3 seconds 0. Unable   Total Score: 46/56  GAIT: Distance walked: 150 Assistive device utilized: Single point cane Level of assistance: Complete Independence Comments: Slow cadence  decreased hip flexion  decreased arm swing.   TODAY'S TREATMENT:    [facility] Adult PT Treatment:                                                DATE: 07-14-23 Recertification  Approved 10 visits 06/12/23-08/11/23 used 1 07-14-23  TUG 10.61 sec 07-14-23  1348.28ft  ( Norm 1678ft to 1980 ft) 5x STS 18.92 Foto 50%  Therapeutic Exercise: Sts with 5 #KB  3 x 8 ER shld with GTB  2 x 8 Star pattern GTB 2 x 10 Manual Therapy: KT tape for left  knee with improvement in gait and return demo for application  Self Care: Encouraging more walking daily and benefits of movement throughout the day   [facility] Adult PT Treatment:                                                DATE:  06-13-23 6 MWT  1240 ft (1662ft to 1980 ft) Therapeutic Exercise: Shoulder  ER with RTB  3 sets - 10 reps Star pattern with RTB 3 x 10 Supine Pelvic Tilt  1 sets - 10 reps Supine Single Knee to Chest Stretch2 sets - 10 reps Supine LTR1 set  5 hold for 20 sec each Supine Bridge 3 sets - 10 reps  Sit to stand with sink support Movement snack 2 sets - 10 reps Sidelying Hip Abduction  2 x 10 Manual Therapy: STW to bil QL  LAD of Left LE holding at knee only to avoid ankle Trigger Point Dry-Needling performed     by Garen Lah Treatment instructions: Expect mild to moderate muscle soreness. S/S of pneumothorax if dry needled over a lung field, and to seek immediate medical attention should they occur. Patient verbalized understanding of these instructions and education.  Patient Consent Given: Yes Education handout provided: Previously provided Muscles treated: Bil QL Electrical stimulation performed: No Parameters: N/A Treatment response/outcome: twitch response noted, pt noted relief                                                                                                                         DATE: EVAL 06-01-23    PATIENT EDUCATION:  Education details: POC Explanation of findings  Person educated: Patient Education method: Explanation and Demonstration Education comprehension: verbalized understanding and returned demonstration  HOME EXERCISE PROGRAM: Access Code: [email or web address] URL: [email or web address] Date: 06/13/2023 Prepared by: Garen Lah  Exercises - Shoulder External Rotation and Scapular Retraction with Resistance  - 1 x daily - 7 x weekly - 3 sets - 10 reps - Standing Shoulder Horizontal Abduction with Resistance  - 1 x daily - 7 x weekly - 3 sets - 10 reps - Standing Shoulder Diagonal Horizontal Abduction 60/120 Degrees with Resistance  - 1 x daily - 7 x weekly - 3 sets - 10 reps - Supine Pelvic Tilt  - 1 x daily - 7 x weekly - 3 sets -  10 reps - Supine Single Knee to Chest Stretch  - 1 x daily - 7 x weekly - 3 sets - 10 reps - Supine Lower Trunk Rotation  - 1 x daily - 7 x weekly - 1 set  hold for 20 sec each - Supine Bridge  - 1 x daily - 7 x weekly - 3 sets - 10 reps - Sit to stand with sink support Movement snack  - 1 x daily - 7 x weekly - 3 sets - 10 reps - Sidelying Hip Abduction  - 1 x daily - 7 x weekly - 3 sets - 10 reps  ASSESSMENT:  CLINICAL IMPRESSION: Pt presents to clinic for 3rd visit  requiring re certification and has 8 visits remaining in authorization until November 1.  and has and HEP established. 6 MWT 1348.9ft  ( Norm 1635ft to 1980 ft).  Pt has improved with 50% for FOTO, TUG 10.61 sec, 5x STS 18.92.  improved from evaluation but still requiring PT for improving functional mobility and strength.  Worked on some exercises with increased challenge with STS and 5# KB weight.   Pt also encouraged to walk for exercise. Pt will benefit from continuing exercise for functional mobility and maximizing strength.    EVAL-Patient is a 52 y.o. female  with English as second language but communicates well in English who was seen today for physical therapy evaluation and treatment for polyarthralgia with pain in neck. Low back, thoracic spine, bil hips and knees.  Pt has most complaint of knee pain at 10/10 pain  L > R.  All else up to 9/10 pain.  Pt reports . She has experienced death of husband from ALS in 2016/09/09 and reports depression and lack of interest in living but is under care of mental health professionals.  She reports she wants to be able to be stronger and play with grandchild. Pt demonstrates global weakness and has to motivated to participate in more than a few movements at a time. With testing pt is weak throughout LE's  and core along with weakness and discomfort in right shoulder, upper back and cervical area. She reports increasing pain in last 6 months but has had pain for greater than 10 years.  She reports  several falls due to dizziness and also with her LE " giving out. Pt with ectomorphic body type. Pt will benefit from skilled PT to address impairments and to increase global strength for better QOL  OBJECTIVE IMPAIRMENTS: decreased activity tolerance, difficulty walking, decreased ROM, decreased strength, dizziness, impaired sensation, improper body mechanics, postural dysfunction, and pain.   ACTIVITY LIMITATIONS: carrying, lifting, bending, sitting, standing, squatting, sleeping, stairs, transfers, and locomotion level  PARTICIPATION LIMITATIONS: meal prep, cleaning, laundry, driving, and community activity  PERSONAL FACTORS:  CVA,  anxiety, depression, "anthralgia globally for years". Mild degenerative changes, chronic fatigue are also affecting patient's functional outcome.   REHAB POTENTIAL: Fair inconsistent attendance in past therapies.   CLINICAL DECISION MAKING: Evolving/moderate complexity  EVALUATION COMPLEXITY: Moderate   GOALS: Goals reviewed with patient? Yes  SHORT TERM GOALS: Target date: 06-15-23  Pt will  be independent with initial HEP Baseline:no knowledge Goal status: ONGOING  2.  Pt will be able to stand with symmetrical weight in Bil LE's   Baseline:  PT wt bears on Right in standing Goal status:ONGOING  3.  Pt pain will be decreased by 25 % for functional activities Baseline: 10/10 in Eval  Goal status: ONGOING  4.  Pt will begin a home walking program to build strength and endurance Baseline: sedentary no exercise Goal status:ONGOING  5. Berg Balance score to improve to 52/56 or > to demonstrate improvement in balance and safety   Baseline: 46/56 eval  Goal Status  ONGOING    LONG TERM GOALS: Target date: 07-13-23  Pt will be independent with advanced HEP Baseline: no knowledge Goal status: INITIAL  2.  Pt will be able to  work on household chores 30 minutes Baseline: Unable to stand for 3 min Goal status: INITIAL  3.  Pt will be able to  negotiate steps with pain 4/10 or less Baseline:  Goal status: INITIAL  4.  Pt will be able to improve BERG balance Baseline:  Goal status: INITIAL  5.  FOTO will improve from 34%   to  58%   indicating improved functional mobility.  Baseline: eval 34% 07-14-23  50% Goal status:ONGOING  6.  Pt will be improve the 6 MWT to at least 1051ft  in 6 minutes Baseline:  Pt unable  to tolerate standing longer than 3 minutes 07-14-23 07-14-23  1348.27ft  ( Norm 1665ft to 1980 ft) Goal status: MET  7. Pt will be improve the 6 MWT to at least within norm  (Norm 1616ft to 1980 ft)    PLAN:  PT FREQUENCY: 1-2x/week  PT DURATION: 6 weeks  PLANNED INTERVENTIONS: Therapeutic exercises, Therapeutic activity, Neuromuscular re-education, Balance training, Gait training, Patient/Family education, Self Care, Joint mobilization, Stair training, DME instructions, Dry Needling, Electrical stimulation, Cryotherapy, Moist heat, Taping, Manual therapy, and Re-evaluation.  PLAN FOR NEXT SESSION: issue HEP.    establish walking program  Garen Lah, PT, [facility] Certified Exercise Expert for the Aging Adult  07/14/23 12:12 PM Phone: [phone] Fax: [phone]   Check all possible CPT codes: 01027 - PT Re-evaluation, 97110- Therapeutic Exercise, [phone]- Neuro Re-education, [phone] - Gait Training, [phone] - Manual Therapy, 97530 - Therapeutic Activities, [phone] - Self Care, and [phone] - Physical performance training    Check all conditions that are expected to impact treatment: {Conditions expected to impact treatment:Musculoskeletal disorders and Psychological or psychiatric disorders   If treatment provided at initial evaluation, no treatment charged due to lack of authorization.    Garen Lah, PT, [facility] Certified Exercise Expert for the Aging Adult  08/15/23 1:14 PM Phone: [phone] Fax: [phone]

## 2023-07-14 ENCOUNTER — Encounter: Payer: Self-pay | Admitting: Physical Therapy

## 2023-07-14 ENCOUNTER — Ambulatory Visit: Payer: Medicaid Other | Attending: Sports Medicine | Admitting: Physical Therapy

## 2023-07-14 DIAGNOSIS — M25562 Pain in left knee: Secondary | ICD-10-CM | POA: Diagnosis present

## 2023-07-14 DIAGNOSIS — M2550 Pain in unspecified joint: Secondary | ICD-10-CM | POA: Insufficient documentation

## 2023-07-14 DIAGNOSIS — G8929 Other chronic pain: Secondary | ICD-10-CM | POA: Insufficient documentation

## 2023-07-14 DIAGNOSIS — M797 Fibromyalgia: Secondary | ICD-10-CM | POA: Diagnosis present

## 2023-07-14 DIAGNOSIS — M5459 Other low back pain: Secondary | ICD-10-CM | POA: Insufficient documentation

## 2023-07-14 DIAGNOSIS — M546 Pain in thoracic spine: Secondary | ICD-10-CM | POA: Insufficient documentation

## 2023-07-14 DIAGNOSIS — M542 Cervicalgia: Secondary | ICD-10-CM | POA: Diagnosis present

## 2023-07-14 DIAGNOSIS — M6281 Muscle weakness (generalized): Secondary | ICD-10-CM | POA: Diagnosis present

## 2023-07-14 DIAGNOSIS — M545 Low back pain, unspecified: Secondary | ICD-10-CM | POA: Insufficient documentation

## 2023-07-17 DIAGNOSIS — F333 Major depressive disorder, recurrent, severe with psychotic symptoms: Secondary | ICD-10-CM | POA: Diagnosis not present

## 2023-07-17 DIAGNOSIS — F4000 Agoraphobia, unspecified: Secondary | ICD-10-CM | POA: Diagnosis not present

## 2023-07-25 NOTE — Progress Notes (Deleted)
Aleen Sells D.[facility] [address] [place] [address] Phone: [phone]   Assessment and Plan:     There are no diagnoses linked to this encounter.  ***   Pertinent previous records reviewed include ***   Follow Up: ***     Subjective:   I, Valerie Richards, am serving as a Neurosurgeon for Doctor Richardean Sale   Chief Complaint: pain all over    HPI:    03/14/2023 Patient is a 52 year old female complaining of pain all over. Patient states when she saw PCP Overall "not feeling good". Pain all over. Neck, knees and legs bad to walk and wake her up. Takes tylenol and that does not seem to help . Cramps in legs and feet can "curl". Some joint pain and some cramps day or night. Upper arms/shoulders, etc hurt. Doesn't do a lot to cause pain. When gets up from sitting, hard to get going. Morning stiffness as well. Foot can slide out from her. Feels like some weakness(will have to pick up legs at times). Knees and feet can be burning or numb/tingle. When walking, feels like "needle sticking me in female area". This has been going on for years but the pain has been getting worse over the past couple of weeks. She has had lab work and it shows nothing. She feels like her toes are cramping all the time her hands are weak.     05/16/2023 Patient states that she feels more pain allover her back , shoulder and her feet she sees bones sticking out . Right knee has pain in the night time the meloxicam only helped a little    08/07/2023 Patient states   Relevant Historical Information: MDD with patient's husband passing from [facility] disease  Additional pertinent review of systems negative.   Current Outpatient Medications:    amitriptyline (ELAVIL) 150 MG tablet, Take 1 tablet (150 mg total) by mouth at bedtime., Disp: 30 tablet, Rfl: 2   Cholecalciferol (VITAMIN D3) 125 MCG (5000 UT) CAPS, Take 1 capsule (5,000 Units total) by mouth daily.,  Disp: 90 capsule, Rfl: 3   clonazePAM (KLONOPIN) 0.5 MG tablet, Take 1 tablet (0.5 mg total) by mouth 2 (two) times daily., Disp: 60 tablet, Rfl: 2   conjugated estrogens (PREMARIN) vaginal cream, Place 0.5 Applicatorfuls vaginally 2 (two) times a week., Disp: 42.5 g, Rfl: 1   Fremanezumab-vfrm (AJOVY) 225 MG/1.5ML SOAJ, Inject 225 mg into the skin every 28 (twenty-eight) days., Disp: 1.68 mL, Rfl: 11   lamoTRIgine (LAMICTAL) 25 MG tablet, Take 1 tablet (25 mg total) by mouth daily., Disp: 30 tablet, Rfl: 2   LINZESS 290 MCG CAPS capsule, Take 1 capsule (290 mcg total) by mouth every morning., Disp: , Rfl:    Melatonin 10 MG CAPS, Take by mouth., Disp: , Rfl:    omeprazole (PRILOSEC) 20 MG capsule, Take 1 capsule (20 mg total) by mouth daily. START with 1 capsule bid for 2 weeks, then reduce to qd., Disp: 45 capsule, Rfl: 5   ondansetron (ZOFRAN) 4 MG tablet, Take 1 tablet (4 mg total) by mouth every 8 (eight) hours as needed for nausea or vomiting., Disp: 20 tablet, Rfl: 0   predniSONE (DELTASONE) 10 MG tablet, Take one tablet daily for the next 20 days. (Patient not taking: Reported on 06/22/2023), Disp: 20 tablet, Rfl: 0   Rimegepant Sulfate (NURTEC) 75 MG TBDP, Take 1 tablet (75 mg total) by mouth as needed (Take one  tablet as needed at the earlist onset of a Migraine.  Max 1 tablet in 24 hours)., Disp: 16 tablet, Rfl: 5   tiZANidine (ZANAFLEX) 4 MG tablet, Take 1 tablet (4 mg total) by mouth 3 (three) times daily as needed for muscle spasms., Disp: 90 tablet, Rfl: 5   triamcinolone cream (KENALOG) 0.1 %, Apply 1 Application topically 2 (two) times daily., Disp: 30 g, Rfl: 0   Objective:     There were no vitals filed for this visit.    There is no height or weight on file to calculate BMI.    Physical Exam:    ***   Electronically signed by:  Aleen Sells D.[facility] 8:18 AM 07/25/23

## 2023-07-27 ENCOUNTER — Ambulatory Visit (INDEPENDENT_AMBULATORY_CARE_PROVIDER_SITE_OTHER): Payer: Medicaid Other | Admitting: Neurology

## 2023-07-27 DIAGNOSIS — M79602 Pain in left arm: Secondary | ICD-10-CM

## 2023-07-27 NOTE — Procedures (Signed)
  [facility]  [address] [place], [address]  [place], [place] [address] Tel: [phone] Fax: [phone] Test Date:  07/27/2023  Patient: Valerie Richards DOB: 28-Feb-1971 Physician: Nita Sickle, DO  Sex: Female Height: 5\' 4"  Ref Phys: Shon Millet, DO  ID#: [id number]   Technician:    History: This is a 52 year old female referred for evaluation of left arm paresthesias.  NCV & EMG Findings: Extensive electrodiagnostic testing of the left upper extremity shows:  Left median, ulnar, and mixed palmar sensory responses are within normal limits. Left median and ulnar motor responses are within normal limits. There is no evidence of active or chronic motor axonal loss changes affecting any of the tested muscles.  Motor unit configuration and recruitment pattern is within normal limits.  Impression: This is a normal study of the left upper extremity.  In particular, there is no evidence of carpal tunnel syndrome, ulnar neuropathy, or cervical radiculopathy.   ___________________________ Nita Sickle, DO    Nerve Conduction Studies   Stim Site NR Peak (ms) Norm Peak (ms) O-P Amp (V) Norm O-P Amp  Left Median Anti Sensory (2nd Digit)  32 C  Wrist    3.0 <3.6 50.6 >15  Left Ulnar Anti Sensory (5th Digit)  32 C  Wrist    3.0 <3.1 28.6 >10     Stim Site NR Onset (ms) Norm Onset (ms) O-P Amp (mV) Norm O-P Amp Site1 Site2 Delta-0 (ms) Dist (cm) Vel (m/s) Norm Vel (m/s)  Left Median Motor (Abd Poll Brev)  32 C  Wrist    3.2 <4.0 11.1 >6 Elbow Wrist 4.7 29.0 62 >50  Elbow    7.9  10.9         Left Ulnar Motor (Abd Dig Minimi)  32 C  Wrist    3.0 <3.1 11.0 >7 B Elbow Wrist 3.5 22.0 63 >50  B Elbow    6.5  10.9  A Elbow B Elbow 1.2 8.0 67 >50  A Elbow    7.7  10.8            Stim Site NR Peak (ms) Norm Peak (ms) P-T Amp (V) Site1 Site2 Delta-P (ms) Norm Delta (ms)  Left Median/Ulnar Palm Comparison (Wrist - 8cm)  32 C  Median Palm    1.5 <2.2 97.6 Median Palm Ulnar Palm  0.2   Ulnar Palm    1.7 <2.2 45.3       Electromyography   Side Muscle Ins.Act Fibs Fasc Recrt Amp Dur Poly Activation Comment  Left 1stDorInt Nml Nml Nml Nml Nml Nml Nml Nml N/A  Left PronatorTeres Nml Nml Nml Nml Nml Nml Nml Nml N/A  Left Biceps Nml Nml Nml Nml Nml Nml Nml Nml N/A  Left Triceps Nml Nml Nml Nml Nml Nml Nml Nml N/A  Left Deltoid Nml Nml Nml Nml Nml Nml Nml Nml N/A      Waveforms:

## 2023-07-28 ENCOUNTER — Other Ambulatory Visit: Payer: Self-pay | Admitting: Family Medicine

## 2023-07-28 DIAGNOSIS — R928 Other abnormal and inconclusive findings on diagnostic imaging of breast: Secondary | ICD-10-CM

## 2023-07-28 DIAGNOSIS — F333 Major depressive disorder, recurrent, severe with psychotic symptoms: Secondary | ICD-10-CM | POA: Diagnosis not present

## 2023-07-28 DIAGNOSIS — F4000 Agoraphobia, unspecified: Secondary | ICD-10-CM | POA: Diagnosis not present

## 2023-07-31 DIAGNOSIS — M7741 Metatarsalgia, right foot: Secondary | ICD-10-CM | POA: Diagnosis not present

## 2023-07-31 DIAGNOSIS — M7742 Metatarsalgia, left foot: Secondary | ICD-10-CM | POA: Diagnosis not present

## 2023-07-31 DIAGNOSIS — M79672 Pain in left foot: Secondary | ICD-10-CM | POA: Diagnosis not present

## 2023-07-31 DIAGNOSIS — M79671 Pain in right foot: Secondary | ICD-10-CM | POA: Diagnosis not present

## 2023-07-31 DIAGNOSIS — M25571 Pain in right ankle and joints of right foot: Secondary | ICD-10-CM | POA: Diagnosis not present

## 2023-07-31 DIAGNOSIS — M25572 Pain in left ankle and joints of left foot: Secondary | ICD-10-CM | POA: Diagnosis not present

## 2023-08-01 ENCOUNTER — Ambulatory Visit: Payer: Medicaid Other | Admitting: Physical Therapy

## 2023-08-03 ENCOUNTER — Ambulatory Visit: Payer: Medicaid Other | Admitting: Physical Therapy

## 2023-08-04 DIAGNOSIS — F4000 Agoraphobia, unspecified: Secondary | ICD-10-CM | POA: Diagnosis not present

## 2023-08-04 DIAGNOSIS — F333 Major depressive disorder, recurrent, severe with psychotic symptoms: Secondary | ICD-10-CM | POA: Diagnosis not present

## 2023-08-07 ENCOUNTER — Ambulatory Visit: Payer: BLUE CROSS/BLUE SHIELD | Admitting: Sports Medicine

## 2023-08-08 ENCOUNTER — Telehealth (HOSPITAL_BASED_OUTPATIENT_CLINIC_OR_DEPARTMENT_OTHER): Payer: Medicaid Other | Admitting: Psychiatry

## 2023-08-08 ENCOUNTER — Encounter (HOSPITAL_COMMUNITY): Payer: Self-pay | Admitting: Psychiatry

## 2023-08-08 ENCOUNTER — Ambulatory Visit: Payer: Medicaid Other | Admitting: Physical Therapy

## 2023-08-08 VITALS — Wt 121.0 lb

## 2023-08-08 DIAGNOSIS — F323 Major depressive disorder, single episode, severe with psychotic features: Secondary | ICD-10-CM

## 2023-08-08 DIAGNOSIS — F410 Panic disorder [episodic paroxysmal anxiety] without agoraphobia: Secondary | ICD-10-CM

## 2023-08-08 DIAGNOSIS — F4312 Post-traumatic stress disorder, chronic: Secondary | ICD-10-CM | POA: Diagnosis not present

## 2023-08-08 MED ORDER — CLONAZEPAM 0.5 MG PO TABS
0.5000 mg | ORAL_TABLET | Freq: Two times a day (BID) | ORAL | 0 refills | Status: DC
Start: 2023-08-08 — End: 2023-11-24

## 2023-08-08 MED ORDER — AMITRIPTYLINE HCL 150 MG PO TABS: 150.0000 mg | ORAL_TABLET | Freq: Every day | ORAL | 0 refills | Status: DC

## 2023-08-08 MED ORDER — VORTIOXETINE HBR 10 MG PO TABS
5.0000 mg | ORAL_TABLET | Freq: Every day | ORAL | 0 refills | Status: DC
Start: 2023-08-08 — End: 2023-08-15

## 2023-08-08 NOTE — Progress Notes (Signed)
[facility] MD Virtual Progress Note   Patient Location: Home Provider Location: Home Office  I connect with patient by video and verified that I am speaking with correct person by using two identifiers. I discussed the limitations of evaluation and management by telemedicine and the availability of in person appointments. I also discussed with the patient that there may be a patient responsible charge related to this service. The patient expressed understanding and agreed to proceed.  Valerie Richards [id number] 52 y.o.  08/08/2023 2:36 PM  History of Present Illness:  Patient is evaluated by video session.  She reported things are not going well.  She started to have a lot of depression, anxiety, dysphoria and lately started to have hallucination.  She is thinking about her family back in her hometown.  She reported having nightmares and flashback.  She was taking Abilify and Cymbalta however she was noncompliant with medication and we discontinued.  We started her on Lamictal and she reported some time she would forget to take the medicine.  She also taking amitriptyline 150 mg and Klonopin.  She reported feeling tired and exhausted.  She also reported thinking about bad things going to happen to her and her family.  She feel that people calling her name when she turned around no one is there.  Recently she had a visit to the neurology for pain and she had not conduction studies but no new medication added.  She has headaches.  She sleeps 7 8 hours but next day she feels very tired.  She started therapy with drinking at [facility] counseling.  She denies any suicidal thoughts or homicidal thoughts.  She reported fatigue, memory issues, headaches, stomach issues.  She thinks every day about her family who live in [place].  Her appetite is fair.  She reported recently increased caring for blood sugar and she is afraid that she may get dental caries because her sugar intake is increased.  She  is not sure why she has sugar intake.  She also reported itching but no rash.  Past Psychiatric History: H/O depression, anxiety, childhood trauma and panic attack.  Saw provider few years ago at [facility] and prescribed Lexapro, Prozac, Effexor, mirtazapine but did not felt it helped.  Prescribed Zoloft, Ambien and hydroxyzine by PCP.  We tried Cymbalta and Abilify. She was recommended inpatient years ago but did not stay in the hospital.    Outpatient Encounter Medications as of 08/08/2023  Medication Sig   amitriptyline (ELAVIL) 150 MG tablet Take 1 tablet (150 mg total) by mouth at bedtime.   Cholecalciferol (VITAMIN D3) 125 MCG (5000 UT) CAPS Take 1 capsule (5,000 Units total) by mouth daily.   clonazePAM (KLONOPIN) 0.5 MG tablet Take 1 tablet (0.5 mg total) by mouth 2 (two) times daily.   conjugated estrogens (PREMARIN) vaginal cream Place 0.5 Applicatorfuls vaginally 2 (two) times a week.   Fremanezumab-vfrm (AJOVY) 225 MG/1.5ML SOAJ Inject 225 mg into the skin every 28 (twenty-eight) days.   lamoTRIgine (LAMICTAL) 25 MG tablet Take 1 tablet (25 mg total) by mouth daily.   LINZESS 290 MCG CAPS capsule Take 1 capsule (290 mcg total) by mouth every morning.   Melatonin 10 MG CAPS Take by mouth.   omeprazole (PRILOSEC) 20 MG capsule Take 1 capsule (20 mg total) by mouth daily. START with 1 capsule bid for 2 weeks, then reduce to qd.   ondansetron (ZOFRAN) 4 MG tablet Take 1 tablet (4 mg total) by mouth every  8 (eight) hours as needed for nausea or vomiting.   predniSONE (DELTASONE) 10 MG tablet Take one tablet daily for the next 20 days. (Patient not taking: Reported on 06/22/2023)   Rimegepant Sulfate (NURTEC) 75 MG TBDP Take 1 tablet (75 mg total) by mouth as needed (Take one tablet as needed at the earlist onset of a Migraine.  Max 1 tablet in 24 hours).   tiZANidine (ZANAFLEX) 4 MG tablet Take 1 tablet (4 mg total) by mouth 3 (three) times daily as needed for muscle spasms.    triamcinolone cream (KENALOG) 0.1 % Apply 1 Application topically 2 (two) times daily.   No facility-administered encounter medications on file as of 08/08/2023.    Recent Results (from the past 2160 hour(s))  ECHOCARDIOGRAM COMPLETE     Status: None   Collection Time: 05/30/23  3:40 PM  Result Value Ref Range   Area-P 1/2 3.06 cm2   S' Lateral 2.10 cm   Est EF 65 - 70%      Psychiatric Specialty Exam: Physical Exam  Review of Systems  Constitutional:  Positive for fatigue.  Psychiatric/Behavioral:  Positive for decreased concentration, dysphoric mood, hallucinations and sleep disturbance.     Weight 121 lb (54.9 kg), last menstrual period 02/26/2020.There is no height or weight on file to calculate BMI.  General Appearance: Casual and wearing scarf  Eye Contact:  Fair  Speech:  Slow  Volume:  Decreased  Mood:  Anxious, Depressed, Dysphoric, and Hopeless  Affect:  Depressed  Thought Process:  Goal Directed  Orientation:  Full (Time, Place, and Person)  Thought Content:  Hallucinations: Auditory Occasionally hearing voices that people calling her name and Rumination  Suicidal Thoughts:  No  Homicidal Thoughts:  No  Memory:  Immediate;   Fair Recent;   Fair Remote;   Fair  Judgement:  Fair  Insight:  Shallow  Psychomotor Activity:  Decreased  Concentration:  Concentration: Fair and Attention Span: Fair  Recall:  Fair  Fund of Knowledge:  Good  Language:  Good  Akathisia:  No  Handed:  Right  AIMS (if indicated):     Assets:  Communication Skills Desire for Improvement Social Support  ADL's:  Intact  Cognition:  WNL  Sleep:  fair     Assessment/Plan: Severe major depression with psychotic features (HCC) - Plan: clonazePAM (KLONOPIN) 0.5 MG tablet, amitriptyline (ELAVIL) 150 MG tablet, vortioxetine HBr (TRINTELLIX) 10 MG TABS tablet  Chronic post-traumatic stress disorder (PTSD) - Plan: clonazePAM (KLONOPIN) 0.5 MG tablet, vortioxetine HBr (TRINTELLIX) 10 MG TABS  tablet  Panic attacks - Plan: clonazePAM (KLONOPIN) 0.5 MG tablet, vortioxetine HBr (TRINTELLIX) 10 MG TABS tablet  Patient is not doing very well and regressing from the past.  She is not sure about the Lamictal helping and she also noticed increased sugar intake and itching.  We will discontinue Lamictal.  I encouraged to talk to her primary care about getting blood sugar.  Recommend to try Trintellix 5 mg for 1 week and then 10 mg to help with depression and anxiety.  Continue Klonopin 0.5 mg to take twice a day and amitriptyline 150 mg at bedtime.  In the past she had tried multiple antidepressants but she was not consistent and not sure if they helped or caused side effects.  Again she is not sure of the Lamictal causing the itching or sugar intake but she is little concerned lately.  She has appointment coming up to see her primary care and encouraged to get blood  work.  Discussed medication side effects and benefits.  Encouraged to see therapist on a regular basis.  Discussed safety concerns and any time having active suicidal thoughts or homicidal halogen need to call 911 or go to local emergency room.  Follow-up in 4 weeks.   Follow Up Instructions:     I discussed the assessment and treatment plan with the patient. The patient was provided an opportunity to ask questions and all were answered. The patient agreed with the plan and demonstrated an understanding of the instructions.   The patient was advised to call back or seek an in-person evaluation if the symptoms worsen or if the condition fails to improve as anticipated.    Collaboration of Care: Other provider involved in patient's care AEB notes are available in epic to review  Patient/Guardian was advised Release of Information must be obtained prior to any record release in order to collaborate their care with an outside provider. Patient/Guardian was advised if they have not already done so to contact the registration department  to sign all necessary forms in order for [place] to release information regarding their care.   Consent: Patient/Guardian gives verbal consent for treatment and assignment of benefits for services provided during this visit. Patient/Guardian expressed understanding and agreed to proceed.     I provided 30 minutes of non face to face time during this encounter.  Note: This document was prepared by [employer] voice dictation technology and any errors that results from this process are unintentional.    Cleotis Nipper, MD 08/08/2023

## 2023-08-10 ENCOUNTER — Ambulatory Visit (INDEPENDENT_AMBULATORY_CARE_PROVIDER_SITE_OTHER): Payer: Medicaid Other | Admitting: Family

## 2023-08-10 VITALS — BP 109/66 | HR 60 | Temp 98.0°F | Ht 64.0 in | Wt 123.0 lb

## 2023-08-10 DIAGNOSIS — R638 Other symptoms and signs concerning food and fluid intake: Secondary | ICD-10-CM | POA: Diagnosis not present

## 2023-08-10 DIAGNOSIS — K5909 Other constipation: Secondary | ICD-10-CM | POA: Diagnosis not present

## 2023-08-10 MED ORDER — LINZESS 290 MCG PO CAPS
290.0000 ug | ORAL_CAPSULE | Freq: Every morning | ORAL | 3 refills | Status: DC
Start: 2023-08-10 — End: 2023-11-09

## 2023-08-10 MED ORDER — LINZESS 290 MCG PO CAPS
290.0000 ug | ORAL_CAPSULE | Freq: Every morning | ORAL | 3 refills | Status: DC
Start: 2023-08-10 — End: 2023-08-10

## 2023-08-10 NOTE — Assessment & Plan Note (Signed)
chronic taking Linzess every day refilling med today f/u 4 mos

## 2023-08-10 NOTE — Progress Notes (Signed)
Patient ID: Valerie Richards, female    DOB: 1971-07-14, 52 y.o.   MRN: [id number]  Chief Complaint  Patient presents with   Gastroesophageal Reflux   Constipation  Discussed the use of AI scribe software for clinical note transcription with the patient, who gave verbal consent to proceed.  History of Present Illness   The patient, with a history of constipation and acid reflux, presents for a medication refill and to discuss new symptoms. Chronic hx of constipation, mostly medication induced, taking Linzess, but reports still having issues some days.  She reports a tendency to overeat, particularly sweets, which leads to feelings of nausea and an upset stomach. Despite recognizing the negative effects, she struggles to control these cravings. She also reports occasional shortness of breath, which she manages with deep breathing exercises. The patient has been trying to increase her physical activity and social interactions to improve her overall health. She also mentions a history of migraines, which seem to be triggered by her excessive sugar intake.      Assessment & Plan:     Chronic Constipation - Managed with Linzess qd. Discussed the importance of hydration, 2L qd and the potential addition of a stool softener or MiraLAX. -Refill Linzess at the current high dose. -Consider adding a stool softener or MiraLAX as needed. -Follow-up in 4 months.  Sugar Cravings - Significant cravings leading to overconsumption of sweets. Discussed potential medication side effects and the addictive nature of sugar. -Referral to a nutritionist for dietary counseling and strategies to manage cravings.  Shortness of Breath - Reports occasional shortness of breath. O2 sat good today, no visible distress, possibly allergy related. Encouraged deep breathing exercises and increased physical activity. -Monitor symptoms and report if worsening.      Subjective:    Outpatient Medications Prior to Visit   Medication Sig Dispense Refill   amitriptyline (ELAVIL) 150 MG tablet Take 1 tablet (150 mg total) by mouth at bedtime. 30 tablet 0   Cholecalciferol (VITAMIN D3) 125 MCG (5000 UT) CAPS Take 1 capsule (5,000 Units total) by mouth daily. 90 capsule 3   clonazePAM (KLONOPIN) 0.5 MG tablet Take 1 tablet (0.5 mg total) by mouth 2 (two) times daily. 60 tablet 0   conjugated estrogens (PREMARIN) vaginal cream Place 0.5 Applicatorfuls vaginally 2 (two) times a week. 42.5 g 1   Fremanezumab-vfrm (AJOVY) 225 MG/1.5ML SOAJ Inject 225 mg into the skin every 28 (twenty-eight) days. 1.68 mL 11   Melatonin 10 MG CAPS Take by mouth.     omeprazole (PRILOSEC) 20 MG capsule Take 1 capsule (20 mg total) by mouth daily. START with 1 capsule bid for 2 weeks, then reduce to qd. 45 capsule 5   ondansetron (ZOFRAN) 4 MG tablet Take 1 tablet (4 mg total) by mouth every 8 (eight) hours as needed for nausea or vomiting. 20 tablet 0   predniSONE (DELTASONE) 10 MG tablet Take one tablet daily for the next 20 days. 20 tablet 0   Rimegepant Sulfate (NURTEC) 75 MG TBDP Take 1 tablet (75 mg total) by mouth as needed (Take one tablet as needed at the earlist onset of a Migraine.  Max 1 tablet in 24 hours). 16 tablet 5   tiZANidine (ZANAFLEX) 4 MG tablet Take 1 tablet (4 mg total) by mouth 3 (three) times daily as needed for muscle spasms. 90 tablet 5   triamcinolone cream (KENALOG) 0.1 % Apply 1 Application topically 2 (two) times daily. 30 g 0   vortioxetine  HBr (TRINTELLIX) 10 MG TABS tablet Take 0.5 tablets (5 mg total) by mouth daily. 30 tablet 0   LINZESS 290 MCG CAPS capsule Take 1 capsule (290 mcg total) by mouth every morning.     lamoTRIgine (LAMICTAL) 25 MG tablet Take 1 tablet (25 mg total) by mouth daily. (Patient not taking: Reported on 08/08/2023) 30 tablet 2   No facility-administered medications prior to visit.   Past Medical History:  Diagnosis Date   Acute recurrent ethmoidal sinusitis 09/03/2018   Anemia     iron deficiency   Anxiety    Depression    Fibroid    Menorrhagia 01/20/2021   Migraine    Nasal pain 09/03/2018   Stroke (HCC) 12/2020   See MRI.   Vaginal discharge 01/20/2021   Vaginitis and vulvovaginitis 01/20/2021   Past Surgical History:  Procedure Laterality Date   EYE SURGERY Bilateral    TONSILLECTOMY     No Known Allergies    Objective:    Physical Exam Vitals and nursing note reviewed.  Constitutional:      Appearance: Normal appearance.  Cardiovascular:     Rate and Rhythm: Normal rate and regular rhythm.  Pulmonary:     Effort: Pulmonary effort is normal.     Breath sounds: Normal breath sounds.  Musculoskeletal:        General: Normal range of motion.  Skin:    General: Skin is warm and dry.  Neurological:     Mental Status: She is alert.  Psychiatric:        Mood and Affect: Mood normal.        Behavior: Behavior normal.    BP 109/66   Pulse 60   Temp 98 F (36.7 C) (Temporal)   Ht 5\' 4"  (1.626 m)   Wt 123 lb (55.8 kg)   LMP 02/26/2020 (Exact Date)   SpO2 98%   BMI 21.11 kg/m  Wt Readings from Last 3 Encounters:  08/10/23 123 lb (55.8 kg)  06/22/23 121 lb (54.9 kg)  06/09/23 118 lb 3.2 oz (53.6 kg)      Dulce Sellar, NP

## 2023-08-11 ENCOUNTER — Ambulatory Visit: Payer: Medicaid Other | Admitting: Physical Therapy

## 2023-08-12 ENCOUNTER — Other Ambulatory Visit (HOSPITAL_COMMUNITY): Payer: Self-pay | Admitting: Psychiatry

## 2023-08-12 DIAGNOSIS — F323 Major depressive disorder, single episode, severe with psychotic features: Secondary | ICD-10-CM

## 2023-08-12 DIAGNOSIS — F4312 Post-traumatic stress disorder, chronic: Secondary | ICD-10-CM

## 2023-08-12 DIAGNOSIS — F410 Panic disorder [episodic paroxysmal anxiety] without agoraphobia: Secondary | ICD-10-CM

## 2023-08-15 ENCOUNTER — Ambulatory Visit: Payer: Medicaid Other | Admitting: Physical Therapy

## 2023-08-15 ENCOUNTER — Telehealth: Payer: Self-pay | Admitting: Physical Therapy

## 2023-08-15 NOTE — Telephone Encounter (Signed)
Pt called through interpreter Arabic # Suzan to communicate to patient about missed appt today.  Pt has had 8 appt cancellations and attendance policy has been explained.  Ms Jolley has been regulating other medication and has been too drowsy to participate.  Pt POC is expired and she will return to MD for further evaluation and she would like to DC at this time and return after she has taken of other medical needs.  All appt will be cancelled for this year and she verbalized understanding through interpreter.   Garen Lah, PT, ATRIC Certified Exercise Expert for the Aging Adult  08/15/23 1:10 PM Phone: [phone] Fax: [phone]

## 2023-08-16 ENCOUNTER — Other Ambulatory Visit (HOSPITAL_COMMUNITY): Payer: Self-pay | Admitting: Psychiatry

## 2023-08-16 DIAGNOSIS — F419 Anxiety disorder, unspecified: Secondary | ICD-10-CM

## 2023-08-16 DIAGNOSIS — F323 Major depressive disorder, single episode, severe with psychotic features: Secondary | ICD-10-CM

## 2023-08-17 ENCOUNTER — Other Ambulatory Visit: Payer: Self-pay | Admitting: Family

## 2023-08-17 ENCOUNTER — Ambulatory Visit: Payer: Medicaid Other | Admitting: Physical Therapy

## 2023-08-17 DIAGNOSIS — R928 Other abnormal and inconclusive findings on diagnostic imaging of breast: Secondary | ICD-10-CM

## 2023-08-18 ENCOUNTER — Ambulatory Visit
Admission: RE | Admit: 2023-08-18 | Discharge: 2023-08-18 | Disposition: A | Discharge status: Home / Self Care | Payer: Medicaid Other | Source: Ambulatory Visit | Attending: Family Medicine | Admitting: Family Medicine

## 2023-08-18 DIAGNOSIS — R928 Other abnormal and inconclusive findings on diagnostic imaging of breast: Secondary | ICD-10-CM

## 2023-08-22 ENCOUNTER — Ambulatory Visit: Payer: Medicaid Other | Admitting: Physical Therapy

## 2023-08-24 ENCOUNTER — Encounter: Payer: Medicaid Other | Admitting: Physical Therapy

## 2023-09-05 ENCOUNTER — Other Ambulatory Visit (HOSPITAL_COMMUNITY): Payer: Self-pay

## 2023-09-05 ENCOUNTER — Telehealth: Payer: Self-pay

## 2023-09-05 NOTE — Telephone Encounter (Signed)
*  Primary  Pharmacy Patient Advocate Encounter   Received notification from CoverMyMeds that prior authorization for Linzess capsules  is required/requested.   Insurance verification completed.   The patient is insured through [facility] .   Per test claim: PA required; PA started via CoverMyMeds. KEY [id number] . Waiting for clinical questions to populate.

## 2023-09-05 NOTE — Telephone Encounter (Signed)
Additional questions populated, answered and submitted. Pending determination

## 2023-09-06 NOTE — Telephone Encounter (Signed)
Pharmacy Patient Advocate Encounter  Received notification from [facility] that Prior Authorization for Valerie Richards has been DENIED.  Full denial letter will be uploaded to the media tab. See denial reason below.

## 2023-09-11 ENCOUNTER — Telehealth: Payer: Self-pay | Admitting: Family

## 2023-09-11 NOTE — Telephone Encounter (Signed)
Patient states insurance will not cover Linzess. Requests a RX for an alternative be sent to  [facility] [address], [place] - [address] Phone: [phone]  Fax: [phone]

## 2023-09-12 NOTE — Telephone Encounter (Signed)
can you ask the pharmacy team to do a PA, med has been covered by her Medicaid in past, pt has been on for long time, thx.

## 2023-09-13 ENCOUNTER — Other Ambulatory Visit (HOSPITAL_COMMUNITY): Payer: Self-pay

## 2023-09-13 NOTE — Telephone Encounter (Signed)
I called and spoke with pt, awaiting on pt to send new insurance card through MyChart.

## 2023-09-14 ENCOUNTER — Other Ambulatory Visit (HOSPITAL_COMMUNITY): Payer: Self-pay | Admitting: Psychiatry

## 2023-09-14 ENCOUNTER — Encounter: Payer: Self-pay | Admitting: Family

## 2023-09-14 DIAGNOSIS — F4312 Post-traumatic stress disorder, chronic: Secondary | ICD-10-CM

## 2023-09-14 DIAGNOSIS — F323 Major depressive disorder, single episode, severe with psychotic features: Secondary | ICD-10-CM

## 2023-09-18 ENCOUNTER — Telehealth: Payer: Self-pay

## 2023-09-18 ENCOUNTER — Other Ambulatory Visit (HOSPITAL_COMMUNITY): Payer: Self-pay

## 2023-09-18 NOTE — Telephone Encounter (Signed)
Pharmacy Patient Advocate Encounter   Received notification from Physician's Office that prior authorization for Linzess is required/requested.   Insurance verification completed.   The patient is insured through [facility] MEDICAID .   Per test claim: Received a paid claim. Placed a call to [facility] and asked to process with new insurance information and the pharmacist stated he received a paid claim of $4 and he would get it ready for her. No prior auth needed at this time.

## 2023-10-06 ENCOUNTER — Other Ambulatory Visit (HOSPITAL_COMMUNITY): Payer: Self-pay | Admitting: Psychiatry

## 2023-10-06 DIAGNOSIS — F4312 Post-traumatic stress disorder, chronic: Secondary | ICD-10-CM

## 2023-10-06 DIAGNOSIS — F323 Major depressive disorder, single episode, severe with psychotic features: Secondary | ICD-10-CM

## 2023-10-17 ENCOUNTER — Encounter (HOSPITAL_BASED_OUTPATIENT_CLINIC_OR_DEPARTMENT_OTHER): Payer: Self-pay | Admitting: Emergency Medicine

## 2023-10-17 ENCOUNTER — Encounter: Payer: Self-pay | Admitting: Physician Assistant

## 2023-10-17 ENCOUNTER — Other Ambulatory Visit: Payer: Self-pay

## 2023-10-17 ENCOUNTER — Emergency Department (HOSPITAL_BASED_OUTPATIENT_CLINIC_OR_DEPARTMENT_OTHER)
Admission: EM | Admit: 2023-10-17 | Discharge: 2023-10-17 | Disposition: A | Discharge status: Home / Self Care | Payer: Medicare Other | Attending: Emergency Medicine | Admitting: Emergency Medicine

## 2023-10-17 ENCOUNTER — Emergency Department (HOSPITAL_BASED_OUTPATIENT_CLINIC_OR_DEPARTMENT_OTHER): Payer: Medicare Other

## 2023-10-17 ENCOUNTER — Ambulatory Visit (INDEPENDENT_AMBULATORY_CARE_PROVIDER_SITE_OTHER): Payer: Medicare Other | Admitting: Physician Assistant

## 2023-10-17 VITALS — BP 80/50 | HR 91 | Temp 97.9°F | Ht 64.0 in | Wt 121.0 lb

## 2023-10-17 DIAGNOSIS — R059 Cough, unspecified: Secondary | ICD-10-CM | POA: Diagnosis present

## 2023-10-17 DIAGNOSIS — R42 Dizziness and giddiness: Secondary | ICD-10-CM | POA: Diagnosis not present

## 2023-10-17 DIAGNOSIS — E860 Dehydration: Secondary | ICD-10-CM | POA: Diagnosis not present

## 2023-10-17 DIAGNOSIS — R509 Fever, unspecified: Secondary | ICD-10-CM

## 2023-10-17 DIAGNOSIS — B349 Viral infection, unspecified: Secondary | ICD-10-CM | POA: Insufficient documentation

## 2023-10-17 LAB — CBC
HCT: 38 % (ref 36.0–46.0)
Hemoglobin: 12.9 g/dL (ref 12.0–15.0)
MCH: 30.5 pg (ref 26.0–34.0)
MCHC: 33.9 g/dL (ref 30.0–36.0)
MCV: 89.8 fL (ref 80.0–100.0)
Platelets: 195 10*3/uL (ref 150–400)
RBC: 4.23 MIL/uL (ref 3.87–5.11)
RDW: 12.9 % (ref 11.5–15.5)
WBC: 5.9 10*3/uL (ref 4.0–10.5)
nRBC: 0 % (ref 0.0–0.2)

## 2023-10-17 LAB — POC INFLUENZA A&B (BINAX/QUICKVUE)
Influenza A, POC: NEGATIVE
Influenza B, POC: NEGATIVE

## 2023-10-17 LAB — BASIC METABOLIC PANEL
Anion gap: 9 (ref 5–15)
BUN: 16 mg/dL (ref 6–20)
CO2: 29 mmol/L (ref 22–32)
Calcium: 8.9 mg/dL (ref 8.9–10.3)
Chloride: 97 mmol/L — ABNORMAL LOW (ref 98–111)
Creatinine, Ser: 0.72 mg/dL (ref 0.44–1.00)
GFR, Estimated: >60 mL/min (ref >60)
Glucose, Bld: 122 mg/dL — ABNORMAL HIGH (ref 70–99)
Potassium: 4 mmol/L (ref 3.5–5.1)
Sodium: 135 mmol/L (ref 135–145)

## 2023-10-17 LAB — URINALYSIS, ROUTINE W REFLEX MICROSCOPIC
Bilirubin Urine: NEGATIVE
Glucose, UA: NEGATIVE mg/dL
Ketones, ur: NEGATIVE mg/dL
Leukocytes,Ua: NEGATIVE
Nitrite: NEGATIVE
Protein, ur: 100 mg/dL — AB
RBC / HPF: >50 RBC/hpf (ref 0–5)
Specific Gravity, Urine: 1.028 (ref 1.005–1.030)
pH: 6 (ref 5.0–8.0)

## 2023-10-17 LAB — POC COVID19 BINAXNOW: SARS Coronavirus 2 Ag: NEGATIVE

## 2023-10-17 LAB — POCT RAPID STREP A (OFFICE): Rapid Strep A Screen: NEGATIVE

## 2023-10-17 MED ORDER — BENZONATATE 100 MG PO CAPS
100.0000 mg | ORAL_CAPSULE | Freq: Once | ORAL | Status: AC
  Administered 2023-10-17: 100 mg via ORAL
  Filled 2023-10-17: qty 1

## 2023-10-17 MED ORDER — SODIUM CHLORIDE 0.9 % IV BOLUS: 1000.0000 mL | Freq: Once | INTRAVENOUS | Status: DC

## 2023-10-17 MED ORDER — ONDANSETRON HCL 4 MG/2ML IJ SOLN
4.0000 mg | Freq: Once | INTRAMUSCULAR | Status: AC
  Administered 2023-10-17: 4 mg via INTRAVENOUS
  Filled 2023-10-17: qty 2

## 2023-10-17 MED ORDER — SODIUM CHLORIDE 0.9 % IV BOLUS
1000.0000 mL | Freq: Once | INTRAVENOUS | Status: AC
  Administered 2023-10-17: 1000 mL via INTRAVENOUS

## 2023-10-17 MED ORDER — ALUM & MAG HYDROXIDE-SIMETH 200-200-20 MG/5ML PO SUSP
30.0000 mL | Freq: Once | ORAL | Status: AC
Start: 2023-10-17 — End: 2023-10-17
  Administered 2023-10-17: 30 mL via ORAL
  Filled 2023-10-17: qty 30

## 2023-10-17 MED ORDER — KETOROLAC TROMETHAMINE 15 MG/ML IJ SOLN
15.0000 mg | Freq: Once | INTRAMUSCULAR | Status: AC
  Administered 2023-10-17: 15 mg via INTRAVENOUS
  Filled 2023-10-17: qty 1

## 2023-10-17 MED ORDER — DEXAMETHASONE 4 MG PO TABS
10.0000 mg | ORAL_TABLET | Freq: Once | ORAL | Status: AC
  Administered 2023-10-17: 10 mg via ORAL
  Filled 2023-10-17: qty 3

## 2023-10-17 MED ORDER — LIDOCAINE VISCOUS HCL 2 % MT SOLN
15.0000 mL | Freq: Once | OROMUCOSAL | Status: AC
  Administered 2023-10-17: 15 mL via ORAL
  Filled 2023-10-17: qty 15

## 2023-10-17 MED ORDER — ONDANSETRON 4 MG PO TBDP: ORAL_TABLET | ORAL | 0 refills | Status: DC

## 2023-10-17 MED ORDER — BENZONATATE 100 MG PO CAPS: 100.0000 mg | ORAL_CAPSULE | Freq: Three times a day (TID) | ORAL | 0 refills | Status: DC

## 2023-10-17 NOTE — ED Provider Notes (Signed)
 [place] EMERGENCY DEPARTMENT AT [facility] Provider Note   CSN: [id number] Arrival date & time: 10/17/23  1148     History  Chief Complaint  Patient presents with   Dizziness    Valerie Richards is a 53 y.o. female.  53 yo F with a chief complaints of cough congestion sore throat going on for about a week.  She tells me that she is unable to eat or drink because it hurts to swallow.  She went to see her family doctor on recheck today and was noted to be hypotensive and then was sent here for evaluation.   Dizziness      Home Medications Prior to Admission medications   Medication Sig Start Date End Date Taking? Authorizing Provider  benzonatate  (TESSALON ) 100 MG capsule Take 1 capsule (100 mg total) by mouth every 8 (eight) hours. 10/17/23  Yes Emil Share, DO  ondansetron  (ZOFRAN -ODT) 4 MG disintegrating tablet 4mg  ODT q4 hours prn nausea/vomit 10/17/23  Yes Cailen Mihalik, DO  amitriptyline  (ELAVIL ) 150 MG tablet Take 1 tablet (150 mg total) by mouth at bedtime. 08/08/23   Arfeen, Leni DASEN, MD  Cholecalciferol (VITAMIN D3) 125 MCG (5000 UT) CAPS Take 1 capsule (5,000 Units total) by mouth daily. 05/10/23   Lucius Krabbe, NP  clonazePAM  (KLONOPIN ) 0.5 MG tablet Take 1 tablet (0.5 mg total) by mouth 2 (two) times daily. 08/08/23 09/07/23  Arfeen, Leni DASEN, MD  conjugated estrogens  (PREMARIN ) vaginal cream Place 0.5 Applicatorfuls vaginally 2 (two) times a week. Patient not taking: Reported on 10/17/2023 06/08/23   Prentiss Riggs A, NP  Fremanezumab -vfrm (AJOVY ) 225 MG/1.5ML SOAJ Inject 225 mg into the skin every 28 (twenty-eight) days. Patient not taking: Reported on 10/17/2023 06/22/23   Skeet Juliene SAUNDERS, DO  lamoTRIgine  (LAMICTAL ) 25 MG tablet Take 25 mg by mouth daily. 08/16/23   [provider]  LINZESS  290 MCG CAPS capsule Take 1 capsule (290 mcg total) by mouth every morning. 08/10/23   Lucius Krabbe, NP  Melatonin 10 MG CAPS Take by mouth. Patient not taking:  Reported on 10/17/2023 09/19/19   [provider]  omeprazole  (PRILOSEC) 20 MG capsule Take 1 capsule (20 mg total) by mouth daily. START with 1 capsule bid for 2 weeks, then reduce to qd. 05/10/23   Lucius Krabbe, NP  ondansetron  (ZOFRAN ) 4 MG tablet Take 1 tablet (4 mg total) by mouth every 8 (eight) hours as needed for nausea or vomiting. 05/10/23   Lucius Krabbe, NP  Rimegepant Sulfate (NURTEC) 75 MG TBDP Take 1 tablet (75 mg total) by mouth as needed (Take one tablet as needed at the earlist onset of a Migraine.  Max 1 tablet in 24 hours). 01/04/23   Skeet Juliene SAUNDERS, DO  tiZANidine  (ZANAFLEX ) 4 MG tablet Take 1 tablet (4 mg total) by mouth 3 (three) times daily as needed for muscle spasms. 01/04/23   Skeet Juliene SAUNDERS, DO      Allergies    Patient has no known allergies.    Review of Systems   Review of Systems  Neurological:  Positive for dizziness.    Physical Exam Updated Vital Signs BP 103/64   Pulse 87   Temp 100 F (37.8 C)   Resp 19   Ht 5' 4 (1.626 m)   Wt 54 kg   LMP 02/26/2020 (Exact Date)   SpO2 96%   BMI 20.43 kg/m  Physical Exam Vitals and nursing note reviewed.  Constitutional:      General: She  is not in acute distress.    Appearance: She is well-developed. She is not diaphoretic.  HENT:     Head: Normocephalic and atraumatic.     Comments: Swollen turbinates, posterior nasal drip.   Eyes:     Pupils: Pupils are equal, round, and reactive to light.  Cardiovascular:     Rate and Rhythm: Normal rate and regular rhythm.     Heart sounds: No murmur heard.    No friction rub. No gallop.  Pulmonary:     Effort: Pulmonary effort is normal.     Breath sounds: No wheezing or rales.  Abdominal:     General: There is no distension.     Palpations: Abdomen is soft.     Tenderness: There is no abdominal tenderness.  Musculoskeletal:        General: No tenderness.     Cervical back: Normal range of motion and neck supple.  Skin:    General: Skin is  warm and dry.  Neurological:     Mental Status: She is alert and oriented to person, place, and time.  Psychiatric:        Behavior: Behavior normal.     ED Results / Procedures / Treatments   Labs (all labs ordered are listed, but only abnormal results are displayed) Labs Reviewed  BASIC METABOLIC PANEL - Abnormal; Notable for the following components:      Result Value   Chloride 97 (*)    Glucose, Bld 122 (*)    All other components within normal limits  URINALYSIS, ROUTINE W REFLEX MICROSCOPIC - Abnormal; Notable for the following components:   APPearance HAZY (*)    Hgb urine dipstick LARGE (*)    Protein, ur 100 (*)    Bacteria, UA RARE (*)    All other components within normal limits  CBC    EKG EKG Interpretation Date/Time:  Tuesday October 17 2023 13:23:26 EST Ventricular Rate:  81 PR Interval:  173 QRS Duration:  85 QT Interval:  431 QTC Calculation: 501 R Axis:   84  Text Interpretation: Sinus rhythm Borderline prolonged QT interval No significant change since last tracing Confirmed by Emil Share [id number]) on 10/17/2023 2:16:57 PM  Radiology DG Chest Portable 1 View Result Date: 10/17/2023 CLINICAL DATA:  Cough. EXAM: PORTABLE CHEST 1 VIEW COMPARISON:  03/27/2023. FINDINGS: Bilateral lung fields are clear. Bilateral costophrenic angles are clear. Normal cardio-mediastinal silhouette. No acute osseous abnormalities. The soft tissues are within normal limits. IMPRESSION: No active disease. Electronically Signed   By: Ree Molt M.D.   On: 10/17/2023 13:08    Procedures Procedures    Medications Ordered in ED Medications  benzonatate  (TESSALON ) capsule 100 mg (has no administration in time range)  sodium chloride  0.9 % bolus 1,000 mL (0 mLs Intravenous Stopped 10/17/23 1420)  ondansetron  (ZOFRAN ) injection 4 mg (4 mg Intravenous Given 10/17/23 1314)  ketorolac  (TORADOL ) 15 MG/ML injection 15 mg (15 mg Intravenous Given 10/17/23 1315)  dexamethasone  (DECADRON )  tablet 10 mg (10 mg Oral Given 10/17/23 1311)  alum & mag hydroxide-simeth (MAALOX/MYLANTA) 200-200-20 MG/5ML suspension 30 mL (30 mLs Oral Given 10/17/23 1316)    And  lidocaine  (XYLOCAINE ) 2 % viscous mouth solution 15 mL (15 mLs Oral Given 10/17/23 1316)    ED Course/ Medical Decision Making/ A&P                                 Medical  Decision Making Amount and/or Complexity of Data Reviewed Labs: ordered. Radiology: ordered.  Risk OTC drugs. Prescription drug management.   53 yo F with a chief complaints of cough congestion and sore throat going on for the past week.  She has not been eating and drinking because she says it hurts to do so.  She saw her family doctor on repeat check today and was noted to be hypotensive and sent here for evaluation.  Blood pressure here without intervention is a bit better than it was at her doctor's office.  On my record review the patient's blood pressure tends to be on both lower end of normal.  Will give a bolus of IV fluids here.  Treat symptoms.  Oral trial.  Reassess.  No acute anemia, no significant electrolyte abnormalities.  UA without obvious infection.  Patient reassessed after a bolus of IV fluids IV Toradol  oral Decadron  and GI cocktail.  She says that she still does not feel well.  Patient is nontachycardic.  Has not been hypotensive here in the ER.  I discussed symptomatic treatment at home.  Encouraged her to follow-up with her doctor in the office.  2:43 PM:  I have discussed the diagnosis/risks/treatment options with the patient and family.  Evaluation and diagnostic testing in the emergency department does not suggest an emergent condition requiring admission or immediate intervention beyond what has been performed at this time.  They will follow up with PCP. We also discussed returning to the ED immediately if new or worsening sx occur. We discussed the sx which are most concerning (e.g., sudden worsening pain, fever, inability to  tolerate by mouth) that necessitate immediate return. Medications administered to the patient during their visit and any new prescriptions provided to the patient are listed below.  Medications given during this visit Medications  benzonatate  (TESSALON ) capsule 100 mg (has no administration in time range)  sodium chloride  0.9 % bolus 1,000 mL (0 mLs Intravenous Stopped 10/17/23 1420)  ondansetron  (ZOFRAN ) injection 4 mg (4 mg Intravenous Given 10/17/23 1314)  ketorolac  (TORADOL ) 15 MG/ML injection 15 mg (15 mg Intravenous Given 10/17/23 1315)  dexamethasone  (DECADRON ) tablet 10 mg (10 mg Oral Given 10/17/23 1311)  alum & mag hydroxide-simeth (MAALOX/MYLANTA) 200-200-20 MG/5ML suspension 30 mL (30 mLs Oral Given 10/17/23 1316)    And  lidocaine  (XYLOCAINE ) 2 % viscous mouth solution 15 mL (15 mLs Oral Given 10/17/23 1316)     The patient appears reasonably screen and/or stabilized for discharge and I doubt any other medical condition or other [facility] requiring further screening, evaluation, or treatment in the ED at this time prior to discharge.          Final Clinical Impression(s) / ED Diagnoses Final diagnoses:  Acute viral syndrome    Rx / DC Orders ED Discharge Orders          Ordered    benzonatate  (TESSALON ) 100 MG capsule  Every 8 hours        10/17/23 1442    ondansetron  (ZOFRAN -ODT) 4 MG disintegrating tablet        10/17/23 1442              Emil Share, DO 10/17/23 1443

## 2023-10-17 NOTE — Discharge Instructions (Addendum)
 Take tylenol 2 pills 4 times a day and motrin 4 pills 3 times a day.  Drink plenty of fluids.  Return for worsening shortness of breath, headache, confusion. Follow up with your family doctor.

## 2023-10-17 NOTE — ED Triage Notes (Signed)
 C/o dizziness, weakness, cough, and sore throat over the last week. Seen at PCP this morning and was sent here d/t hypotension.

## 2023-10-17 NOTE — Patient Instructions (Addendum)
 It was great to see you!  Please to go the ER -- I'm very concerned about your severe dehydration  [facility] at [facility] Address: [address], [place], [place] [address]  Take care,  Jarold Motto PA-C

## 2023-10-17 NOTE — Progress Notes (Signed)
 Valerie Richards is a 53 y.o. female here for a new problem.  History of Present Illness:   Chief Complaint  Patient presents with   Cough    Pt c/o cough and expectorating yellow sputum, sore throat, feels like she has a fever but has not taken temp., body aches, headache.    Cough  She complains of a cough and a sore throat that has persisted for the past week She is also experiencing accompanying sputum production, headaches, and body aches.  She does report having a fever but doesn't recall the temperature.  She states that she has not been able to eat or drink for the past week due to her sore throat and body aches. She currently reports urinating once a day.  She is also experiencing dizziness and difficulty walking.  She denies any recent travel. Covid and strep throat test in office today are negative.    Past Medical History:  Diagnosis Date   Acute recurrent ethmoidal sinusitis 09/03/2018   Anemia    iron deficiency   Anxiety    Depression    Fibroid    Menorrhagia 01/20/2021   Migraine    Nasal pain 09/03/2018   Stroke (HCC) 12/2020   See MRI.   Vaginal discharge 01/20/2021   Vaginitis and vulvovaginitis 01/20/2021     Social History   Tobacco Use   Smoking status: Former    Current packs/day: 0.00    Average packs/day: 0.3 packs/day for 8.0 years (2.0 ttl pk-yrs)    Types: Cigarettes    Start date: 10/10/2004    Quit date: 10/10/2012    Years since quitting: 11.0   Smokeless tobacco: Never  Vaping Use   Vaping status: Never Used  Substance Use Topics   Alcohol use: No    Alcohol/week: 0.0 standard drinks of alcohol   Drug use: No    Past Surgical History:  Procedure Laterality Date   EYE SURGERY Bilateral    TONSILLECTOMY      Family History  Problem Relation Age of Onset   Hypertension Mother    Cancer Father    Diabetes Father    Colon cancer Father    Kidney failure Father    Diabetes Brother     No Known Allergies  Current  Medications:   Current Outpatient Medications:    amitriptyline  (ELAVIL ) 150 MG tablet, Take 1 tablet (150 mg total) by mouth at bedtime., Disp: 30 tablet, Rfl: 0   Cholecalciferol (VITAMIN D3) 125 MCG (5000 UT) CAPS, Take 1 capsule (5,000 Units total) by mouth daily., Disp: 90 capsule, Rfl: 3   lamoTRIgine  (LAMICTAL ) 25 MG tablet, Take 25 mg by mouth daily., Disp: , Rfl:    LINZESS  290 MCG CAPS capsule, Take 1 capsule (290 mcg total) by mouth every morning., Disp: 30 capsule, Rfl: 3   omeprazole  (PRILOSEC) 20 MG capsule, Take 1 capsule (20 mg total) by mouth daily. START with 1 capsule bid for 2 weeks, then reduce to qd., Disp: 45 capsule, Rfl: 5   ondansetron  (ZOFRAN ) 4 MG tablet, Take 1 tablet (4 mg total) by mouth every 8 (eight) hours as needed for nausea or vomiting., Disp: 20 tablet, Rfl: 0   Rimegepant Sulfate (NURTEC) 75 MG TBDP, Take 1 tablet (75 mg total) by mouth as needed (Take one tablet as needed at the earlist onset of a Migraine.  Max 1 tablet in 24 hours)., Disp: 16 tablet, Rfl: 5   tiZANidine  (ZANAFLEX ) 4 MG tablet, Take 1 tablet (  4 mg total) by mouth 3 (three) times daily as needed for muscle spasms., Disp: 90 tablet, Rfl: 5   clonazePAM  (KLONOPIN ) 0.5 MG tablet, Take 1 tablet (0.5 mg total) by mouth 2 (two) times daily., Disp: 60 tablet, Rfl: 0   conjugated estrogens  (PREMARIN ) vaginal cream, Place 0.5 Applicatorfuls vaginally 2 (two) times a week. (Patient not taking: Reported on 10/17/2023), Disp: 42.5 g, Rfl: 1   Fremanezumab -vfrm (AJOVY ) 225 MG/1.5ML SOAJ, Inject 225 mg into the skin every 28 (twenty-eight) days. (Patient not taking: Reported on 10/17/2023), Disp: 1.68 mL, Rfl: 11   Melatonin 10 MG CAPS, Take by mouth. (Patient not taking: Reported on 10/17/2023), Disp: , Rfl:    Review of Systems:   Review of Systems  Constitutional:  Positive for fever.  HENT:  Positive for sore throat.   Respiratory:  Positive for cough and sputum production.   Musculoskeletal:  Positive  for joint pain.  Neurological:  Positive for headaches.    Vitals:   Vitals:   10/17/23 1115  BP: (!) 80/50  Pulse: 91  Temp: 97.9 F (36.6 C)  TempSrc: Temporal  SpO2: 98%  Weight: 121 lb (54.9 kg)  Height: 5' 4 (1.626 m)     Body mass index is 20.77 kg/m.  Physical Exam:   Physical Exam Vitals and nursing note reviewed.  Constitutional:      General: She is not in acute distress.    Appearance: She is well-developed. She is ill-appearing. She is not toxic-appearing.  HENT:     Head: Normocephalic and atraumatic.     Right Ear: Tympanic membrane, ear canal and external ear normal. Tympanic membrane is not erythematous, retracted or bulging.     Left Ear: Tympanic membrane, ear canal and external ear normal. Tympanic membrane is not erythematous, retracted or bulging.     Nose: Nose normal.     Right Sinus: No maxillary sinus tenderness or frontal sinus tenderness.     Left Sinus: No maxillary sinus tenderness or frontal sinus tenderness.     Mouth/Throat:     Mouth: Mucous membranes are dry.     Pharynx: Uvula midline. No posterior oropharyngeal erythema.  Eyes:     General: Lids are normal.     Conjunctiva/sclera: Conjunctivae normal.  Neck:     Trachea: Trachea normal.  Cardiovascular:     Rate and Rhythm: Normal rate and regular rhythm.     Heart sounds: Normal heart sounds, S1 normal and S2 normal.  Pulmonary:     Effort: Pulmonary effort is normal.     Breath sounds: Normal breath sounds. No decreased breath sounds, wheezing, rhonchi or rales.  Lymphadenopathy:     Cervical: No cervical adenopathy.  Skin:    General: Skin is warm and dry.  Neurological:     Mental Status: She is alert.  Psychiatric:        Speech: Speech normal.        Behavior: Behavior normal. Behavior is cooperative.    Results for orders placed or performed in visit on 10/17/23  POC Influenza A&B(BINAX/QUICKVUE)  Result Value Ref Range   Influenza A, POC Negative Negative    Influenza B, POC Negative Negative  POCT rapid strep A  Result Value Ref Range   Rapid Strep A Screen Negative Negative  POC COVID-19  Result Value Ref Range   SARS Coronavirus 2 Ag Negative Negative     Assessment and Plan:   Fever, unspecified fever cause; Dehydration Due to signs and symptom(s)  of severe dehydration that I do not suspect will improve with outpatient care, recommend emergency room evaluation Patient and son are agreeable to plan   I,Safa M Kadhim,acting as a scribe for Lucie Buttner, PA.,have documented all relevant documentation on the behalf of Lucie Buttner, PA,as directed by  Lucie Buttner, PA while in the presence of Lucie Buttner, [place].   I, Lucie Buttner, [place], have reviewed all documentation for this visit. The documentation on 10/17/23 for the exam, diagnosis, procedures, and orders are all accurate and complete.   Lucie Buttner, PA-C

## 2023-10-19 ENCOUNTER — Emergency Department (HOSPITAL_BASED_OUTPATIENT_CLINIC_OR_DEPARTMENT_OTHER): Payer: Medicare Other | Admitting: Radiology

## 2023-10-19 ENCOUNTER — Other Ambulatory Visit: Payer: Self-pay

## 2023-10-19 ENCOUNTER — Emergency Department (HOSPITAL_BASED_OUTPATIENT_CLINIC_OR_DEPARTMENT_OTHER)
Admission: EM | Admit: 2023-10-19 | Discharge: 2023-10-19 | Disposition: A | Discharge status: Home / Self Care | Payer: Medicare Other | Attending: Emergency Medicine | Admitting: Emergency Medicine

## 2023-10-19 DIAGNOSIS — R052 Subacute cough: Secondary | ICD-10-CM | POA: Diagnosis present

## 2023-10-19 DIAGNOSIS — J180 Bronchopneumonia, unspecified organism: Secondary | ICD-10-CM | POA: Insufficient documentation

## 2023-10-19 LAB — COMPREHENSIVE METABOLIC PANEL
ALT: 12 U/L (ref 0–44)
AST: 16 U/L (ref 15–41)
Albumin: 3.7 g/dL (ref 3.5–5.0)
Alkaline Phosphatase: 38 U/L (ref 38–126)
Anion gap: 7 (ref 5–15)
BUN: 19 mg/dL (ref 6–20)
CO2: 30 mmol/L (ref 22–32)
Calcium: 9 mg/dL (ref 8.9–10.3)
Chloride: 103 mmol/L (ref 98–111)
Creatinine, Ser: 0.66 mg/dL (ref 0.44–1.00)
GFR, Estimated: >60 mL/min (ref >60)
Glucose, Bld: 94 mg/dL (ref 70–99)
Potassium: 4.2 mmol/L (ref 3.5–5.1)
Sodium: 140 mmol/L (ref 135–145)
Total Bilirubin: 0.7 mg/dL (ref 0.0–1.2)
Total Protein: 6.3 g/dL — ABNORMAL LOW (ref 6.5–8.1)

## 2023-10-19 LAB — CBC WITH DIFFERENTIAL/PLATELET
Abs Immature Granulocytes: 0.01 10*3/uL (ref 0.00–0.07)
Basophils Absolute: 0 10*3/uL (ref 0.0–0.1)
Basophils Relative: 0 %
Eosinophils Absolute: 0 10*3/uL (ref 0.0–0.5)
Eosinophils Relative: 0 %
HCT: 34.4 % — ABNORMAL LOW (ref 36.0–46.0)
Hemoglobin: 11.2 g/dL — ABNORMAL LOW (ref 12.0–15.0)
Immature Granulocytes: 0 %
Lymphocytes Relative: 25 %
Lymphs Abs: 1.4 10*3/uL (ref 0.7–4.0)
MCH: 29.9 pg (ref 26.0–34.0)
MCHC: 32.6 g/dL (ref 30.0–36.0)
MCV: 91.7 fL (ref 80.0–100.0)
Monocytes Absolute: 0.3 10*3/uL (ref 0.1–1.0)
Monocytes Relative: 5 %
Neutro Abs: 3.8 10*3/uL (ref 1.7–7.7)
Neutrophils Relative %: 70 %
Platelets: 156 10*3/uL (ref 150–400)
RBC: 3.75 MIL/uL — ABNORMAL LOW (ref 3.87–5.11)
RDW: 12.8 % (ref 11.5–15.5)
WBC: 5.5 10*3/uL (ref 4.0–10.5)
nRBC: 0 % (ref 0.0–0.2)

## 2023-10-19 MED ORDER — LIDOCAINE 5 % EX PTCH: 1.0000 | MEDICATED_PATCH | CUTANEOUS | 0 refills | Status: DC

## 2023-10-19 MED ORDER — DEXAMETHASONE 1 MG/ML PO CONC: 10.0000 mg | Freq: Once | ORAL | Status: DC

## 2023-10-19 MED ORDER — SODIUM CHLORIDE 0.9 % IV BOLUS
500.0000 mL | Freq: Once | INTRAVENOUS | Status: AC
  Administered 2023-10-19: 500 mL via INTRAVENOUS

## 2023-10-19 MED ORDER — AMOXICILLIN 500 MG PO CAPS: 1000.0000 mg | ORAL_CAPSULE | Freq: Three times a day (TID) | ORAL | 0 refills | Status: AC

## 2023-10-19 MED ORDER — KETOROLAC TROMETHAMINE 15 MG/ML IJ SOLN
15.0000 mg | Freq: Once | INTRAMUSCULAR | Status: AC
  Administered 2023-10-19: 15 mg via INTRAVENOUS
  Filled 2023-10-19: qty 1

## 2023-10-19 MED ORDER — SODIUM CHLORIDE 0.9 % IV SOLN
1.0000 g | Freq: Once | INTRAVENOUS | Status: AC
  Administered 2023-10-19: 1 g via INTRAVENOUS
  Filled 2023-10-19: qty 10

## 2023-10-19 MED ORDER — AZITHROMYCIN 250 MG PO TABS
500.0000 mg | ORAL_TABLET | Freq: Once | ORAL | Status: AC
  Administered 2023-10-19: 500 mg via ORAL
  Filled 2023-10-19: qty 2

## 2023-10-19 MED ORDER — LIDOCAINE 5 % EX PTCH
1.0000 | MEDICATED_PATCH | Freq: Once | CUTANEOUS | Status: DC
  Administered 2023-10-19: 1 via TRANSDERMAL
  Filled 2023-10-19: qty 1

## 2023-10-19 MED ORDER — ALBUTEROL SULFATE HFA 108 (90 BASE) MCG/ACT IN AERS: 2.0000 | INHALATION_SPRAY | RESPIRATORY_TRACT | Status: DC | PRN

## 2023-10-19 MED ORDER — DEXAMETHASONE 10 MG/ML FOR PEDIATRIC ORAL USE
10.0000 mg | Freq: Once | INTRAMUSCULAR | Status: AC
  Administered 2023-10-19: 10 mg via ORAL
  Filled 2023-10-19: qty 1

## 2023-10-19 NOTE — ED Triage Notes (Signed)
 Pt reports cough for past 10 days, continuing to worsen and causing rib pain with each cough now.  Pt seen 2 days ago, given Rx medication which she reports has not been helping.

## 2023-10-19 NOTE — Discharge Instructions (Addendum)
 You were seen in the emergency department for your cough.  You are developing pneumonia in your lungs and we have given you a course of antibiotics and you should complete this as prescribed.  There is a cough medicine sent to your pharmacy 2 days ago as well and should be at the pharmacy with your antibiotics.  You can also try raw honey in hot water or tea, throat lozenges and I would take ibuprofen in addition to the Tylenol  and NyQuil that you are taking to help with your symptoms.  You should follow-up with your primary doctor in the next few days to have your symptoms rechecked.  You should return to the emergency department for continued fevers despite the antibiotics, worsening shortness of breath or any other new or concerning symptoms.

## 2023-10-19 NOTE — ED Provider Notes (Signed)
 [place] EMERGENCY DEPARTMENT AT [facility] Provider Note   CSN: [id number] Arrival date & time: 10/19/23  1039     History  Chief Complaint  Patient presents with   Cough    Valerie Richards is a 53 y.o. female.  Patient is a 53 year old female with a past medical history of anemia and depression presenting to the emergency department with cough.  Patient states that she has had a cough for the last 10 days and was seen in the ED 2 days ago.  She states that she has had no improvement of her symptoms which prompted her to respond.  She states that she is also started to develop some pain in her left lower chest when she coughs.  She states that she has felt feverish at home.  She states that her cough is now dry.  She denies any shortness of breath.  She states that she has a sore throat.  She states that she has been unable to eat or drink anything due to feeling unwell.  Denies any nausea, vomiting or diarrhea.  The history is provided by the patient.  Cough      Home Medications Prior to Admission medications   Medication Sig Start Date End Date Taking? Authorizing Provider  amoxicillin  (AMOXIL ) 500 MG capsule Take 2 capsules (1,000 mg total) by mouth 3 (three) times daily for 5 days. 10/19/23 10/24/23 Yes Ellouise, Xzavior Reinig K, DO  amitriptyline  (ELAVIL ) 150 MG tablet Take 1 tablet (150 mg total) by mouth at bedtime. 08/08/23   Arfeen, Leni DASEN, MD  benzonatate  (TESSALON ) 100 MG capsule Take 1 capsule (100 mg total) by mouth every 8 (eight) hours. 10/17/23   Emil Share, DO  Cholecalciferol (VITAMIN D3) 125 MCG (5000 UT) CAPS Take 1 capsule (5,000 Units total) by mouth daily. 05/10/23   Lucius Krabbe, NP  clonazePAM  (KLONOPIN ) 0.5 MG tablet Take 1 tablet (0.5 mg total) by mouth 2 (two) times daily. 08/08/23 09/07/23  Arfeen, Leni DASEN, MD  conjugated estrogens  (PREMARIN ) vaginal cream Place 0.5 Applicatorfuls vaginally 2 (two) times a week. Patient not taking: Reported on  10/17/2023 06/08/23   Prentiss Riggs A, NP  Fremanezumab -vfrm (AJOVY ) 225 MG/1.5ML SOAJ Inject 225 mg into the skin every 28 (twenty-eight) days. Patient not taking: Reported on 10/17/2023 06/22/23   Skeet Juliene SAUNDERS, DO  lamoTRIgine  (LAMICTAL ) 25 MG tablet Take 25 mg by mouth daily. 08/16/23   [provider]  LINZESS  290 MCG CAPS capsule Take 1 capsule (290 mcg total) by mouth every morning. 08/10/23   Lucius Krabbe, NP  omeprazole  (PRILOSEC) 20 MG capsule Take 1 capsule (20 mg total) by mouth daily. START with 1 capsule bid for 2 weeks, then reduce to qd. 05/10/23   Lucius Krabbe, NP  ondansetron  (ZOFRAN ) 4 MG tablet Take 1 tablet (4 mg total) by mouth every 8 (eight) hours as needed for nausea or vomiting. 05/10/23   Lucius Krabbe, NP  ondansetron  (ZOFRAN -ODT) 4 MG disintegrating tablet 4mg  ODT q4 hours prn nausea/vomit 10/17/23   Floyd, Dan, DO  Rimegepant Sulfate (NURTEC) 75 MG TBDP Take 1 tablet (75 mg total) by mouth as needed (Take one tablet as needed at the earlist onset of a Migraine.  Max 1 tablet in 24 hours). 01/04/23   Skeet Juliene SAUNDERS, DO  tiZANidine  (ZANAFLEX ) 4 MG tablet Take 1 tablet (4 mg total) by mouth 3 (three) times daily as needed for muscle spasms. 01/04/23   Skeet Juliene SAUNDERS, DO  Allergies    Patient has no known allergies.    Review of Systems   Review of Systems  Respiratory:  Positive for cough.     Physical Exam Updated Vital Signs BP 104/64   Pulse 69   Temp 97.7 F (36.5 C) (Temporal)   Resp 18   LMP 02/26/2020 (Exact Date)   SpO2 99%  Physical Exam Vitals and nursing note reviewed.  Constitutional:      General: She is not in acute distress.    Appearance: Normal appearance.  HENT:     Head: Normocephalic and atraumatic.     Nose: Nose normal.     Mouth/Throat:     Mouth: Mucous membranes are moist.     Pharynx: Posterior oropharyngeal erythema present. No oropharyngeal exudate.     Comments: No tonsillar swelling or exudates, uvula  midline, tolerating secretions, normal phonation, no trismus Eyes:     Extraocular Movements: Extraocular movements intact.     Conjunctiva/sclera: Conjunctivae normal.  Cardiovascular:     Rate and Rhythm: Normal rate and regular rhythm.     Heart sounds: Normal heart sounds.     Comments: Tenderness to palpation over left anterior inferior ribs Pulmonary:     Effort: Pulmonary effort is normal.     Breath sounds: Normal breath sounds.  Abdominal:     General: Abdomen is flat.     Palpations: Abdomen is soft.     Tenderness: There is no abdominal tenderness.  Musculoskeletal:        General: Normal range of motion.     Cervical back: Normal range of motion and neck supple.  Lymphadenopathy:     Cervical: No cervical adenopathy.  Skin:    General: Skin is warm and dry.  Neurological:     General: No focal deficit present.     Mental Status: She is alert and oriented to person, place, and time.  Psychiatric:        Mood and Affect: Mood normal.        Behavior: Behavior normal.     ED Results / Procedures / Treatments   Labs (all labs ordered are listed, but only abnormal results are displayed) Labs Reviewed  CBC WITH DIFFERENTIAL/PLATELET - Abnormal; Notable for the following components:      Result Value   RBC 3.75 (*)    Hemoglobin 11.2 (*)    HCT 34.4 (*)    All other components within normal limits  COMPREHENSIVE METABOLIC PANEL - Abnormal; Notable for the following components:   Total Protein 6.3 (*)    All other components within normal limits    EKG EKG Interpretation Date/Time:  Thursday October 19 2023 11:25:24 EST Ventricular Rate:  62 PR Interval:  172 QRS Duration:  68 QT Interval:  454 QTC Calculation: 460 R Axis:   83  Text Interpretation: Normal sinus rhythm Normal ECG No significant change since last tracing Confirmed by Ellouise Fine ([id number]) on 10/19/2023 11:56:05 AM  Radiology DG Chest 2 View Result Date: 10/19/2023 CLINICAL DATA:   53 year old female with shortness of breath and persistent cough. Rib pain with cough. Former smoker. EXAM: CHEST - 2 VIEW COMPARISON:  Chest radiograph 10/17/2023 and earlier. FINDINGS: EKG button artifact most apparent in the upper lungs. Lung volumes are stable. Normal cardiac size and mediastinal contours. Visualized tracheal air column is within normal limits. Chronic somewhat coarse bilateral increased pulmonary interstitium is stable, likely smoking related. No pneumothorax, pulmonary edema, pleural effusion or consolidation. However, there is  evidence of a left lung base patchy peribronchial opacity which is new from last year, not apparent on the lateral. No other confluent opacity. No acute osseous abnormality identified. Negative visible bowel gas. IMPRESSION: 1. Subtle left lower lung bronchopneumonia. No pleural effusion. Underlying chronic pulmonary interstitial changes. 2. Followup PA and lateral chest X-ray is recommended in 3-4 weeks following trial of antibiotic therapy to ensure resolution. Electronically Signed   By: VEAR Hurst M.D.   On: 10/19/2023 12:10    Procedures Procedures    Medications Ordered in ED Medications  albuterol  (VENTOLIN  HFA) 108 (90 Base) MCG/ACT inhaler 2 puff (has no administration in time range)  lidocaine  (LIDODERM ) 5 % 1-3 patch (1 patch Transdermal Patch Applied 10/19/23 1227)  ketorolac  (TORADOL ) 15 MG/ML injection 15 mg (15 mg Intravenous Given 10/19/23 1225)  dexamethasone  (DECADRON ) 10 MG/ML injection for Pediatric ORAL use 10 mg (10 mg Oral Given 10/19/23 1223)  sodium chloride  0.9 % bolus 500 mL (500 mLs Intravenous New Bag/Given 10/19/23 1229)  cefTRIAXone  (ROCEPHIN ) 1 g in sodium chloride  0.9 % 100 mL IVPB (0 g Intravenous Stopped 10/19/23 1327)  azithromycin  (ZITHROMAX ) tablet 500 mg (500 mg Oral Given 10/19/23 1241)    ED Course/ Medical Decision Making/ A&P Clinical Course as of 10/19/23 1405  Thu Oct 19, 2023  1216 CXR with developing LLL  bronchopneumonia. Will be started on abx. [VK]  1359 Upon reassessment, patient's labs are otherwise within normal range.  Symptoms improved.  She stable for discharge home with outpatient antibiotics and was given strict return precautions. [VK]    Clinical Course User Index [VK] Kingsley, Brooklynne Pereida K, DO                                 Medical Decision Making This patient presents to the ED with chief complaint(s) of cough, chest pain with pertinent past medical history of anemia, depression which further complicates the presenting complaint. The complaint involves an extensive differential diagnosis and also carries with it a high risk of complications and morbidity.    The differential diagnosis includes pneumonia, pneumothorax, rib fracture, contusion, viral syndrome, dehydration, electrolyte abnormality, no evidence of Ludwick's, RPA or PTA or other deep space infection, recent negative strep test.  Likely all  Additional history obtained: Additional history obtained from N/A Records reviewed Primary Care Documents and recent ED records  ED Course and Reassessment: On patient's arrival she is hemodynamically stable in no acute distress.  Was initially evaluated in triage and had EKG and chest x-ray performed.  EKG showed normal sinus rhythm without acute ischemic changes, chest x-ray is pending at this time.  Due to patient's report of not being able to eat or drink anything we will of repeat labs to evaluate for worsening dehydration.  Did have negative COVID and flu swab at her PCP 2 days ago as well as a negative strep.  She will be given symptomatic management and will be closely reassessed.  Independent labs interpretation:  The following labs were independently interpreted: within normal range  Independent visualization of imaging: - I independently visualized the following imaging with scope of interpretation limited to determining acute life threatening conditions related to  emergency care: CXR, which revealed LLL developing bronchopneumonia  Consultation: - Consulted or discussed management/test interpretation w/ external professional: N/A  Consideration for admission or further workup: Patient has no emergent conditions requiring admission or further work-up at this time and is stable  for discharge home with primary care follow-up  Social Determinants of health: N/A    Amount and/or Complexity of Data Reviewed Labs: ordered. Radiology: ordered.  Risk Prescription drug management.          Final Clinical Impression(s) / ED Diagnoses Final diagnoses:  Bronchopneumonia  Subacute cough    Rx / DC Orders ED Discharge Orders          Ordered    amoxicillin  (AMOXIL ) 500 MG capsule  3 times daily        10/19/23 1400              [place], Zeriah Baysinger K, [place] 10/19/23 1405

## 2023-10-19 NOTE — ED Notes (Signed)
Reviewed discharge instructions, medications, and home care with pt. Pt verbalized understanding and had no further questions. Pt exited ED without complications.

## 2023-10-24 ENCOUNTER — Ambulatory Visit: Payer: Self-pay | Admitting: Family

## 2023-10-24 NOTE — Telephone Encounter (Signed)
 Chief Complaint: pneumonia completed her antibiotic; states still weak and uncomfortable Symptoms: weakness, mild SOB, moderate chest pain (improved), headache, cough, depression/anxiety Frequency: x 2 weeks Pertinent Negatives: Patient denies suicidal ideation, fever, congestion, productive cough. Disposition: [] ED /[] Urgent Care (no appt availability in office) / [x] Appointment(In office/virtual)/ []  [facility]/ [] Home Care/ [] Refused Recommended Disposition /[] [facility]/ []  Follow-up with PCP Additional Notes: Patient states her chest pain, SOB and cough have all improved with her antibiotic. Patient states she still feels weak and is having a difficult time sleeping. She states she feels like her depression and anxiety worsened with the pneumonia. Patient states she was talking to a counselor but her insurance isn't accepted anymore. Patient denies any plans or thoughts to harm herself. Patient agreeable to office visit tomorrow, states her daughter will drive her.  Copied from CRM [id number]. Topic: Appointments - Appointment Scheduling >> Oct 24, 2023  9:13 AM Victoria A wrote: Patient went to ER twice within two weeks. Patient was diagnosed with Pneumonia she was prescribed antibiotic for 10 days today is her last day and patient feels very weak has a cough no pain Reason for Disposition  [1] Completed antibiotic treatment AND [2] breathing not back to normal  Answer Assessment - Initial Assessment Questions 1. SYMPTOM: What's the main symptom you're concerned about? (e.g., breathing difficulty, fever, weakness)     Weakness. Patient states she also feels her depression and anxiety have also worsened.  2. ONSET: When did the  weakness start?     X 2 weeks.  3. BETTER-SAME-WORSE: Are you getting better, staying the same, or getting worse compared to the day you were discharged?     She states she feels better; cough has improved much better but she still  feels sick.  4. BREATHING DIFFICULTY: Are you having any difficulty breathing? If Yes, ask: How bad is it?  (e.g., none, mild, moderate, severe)   - MILD: No SOB at rest, mild SOB with walking, speaks normally in sentences, can lie down, no retractions, pulse < 100.    - MODERATE: SOB at rest, SOB with minimal exertion and prefers to sit, cannot lie down flat, speaks in phrases, mild retractions, audible wheezing, pulse 100-120.    - SEVERE: Very SOB at rest, speaks in single words, struggling to breathe, sitting hunched forward, retractions, pulse > 120      A little bit more than mild.  5. FEVER: Do you have a fever? If Yes, ask: What is your temperature, how was it measured, and when did it start?     Denies.  6. SPUTUM: Describe the color of your sputum (clear, white, yellow, green, blood-tinged)     Dry cough; no sputum.  7. DIAGNOSIS CONFIRMATION: When was the pneumonia diagnosed? By whom?     ED diagnosed her with pneumonia on 1/9.  8. ANTIBIOTIC: Are you taking an antibiotic?  If Yes, ask: Which one? When was it started?     Amoxicillin , she states she started on 10/19/23 and states today is her last dose.  9. OTHER TREATMENT: Are you receiving any other treatment for the pneumonia? (e.g., albuterol  nebulizer, oxygen) If Yes, ask: How often? and Does it help?     Benzonatate ; she states it did not really help.  10. HOSPITAL ADMISSION: Were you hospitalized for this pneumonia? If Yes, ask: When were you discharged home from the hospital?       Discharged from the ED home.  11. O2 SATURATION  MONITOR:  Do you use an oxygen saturation monitor (pulse oximeter) at home? If Yes, What is your reading (oxygen level) today? What is your usual oxygen saturation reading? (e.g., 95%)       Denies.  Protocols used: Pneumonia Follow-up Call-A-AH

## 2023-10-24 NOTE — Telephone Encounter (Signed)
 Please see patient triage as FYI; pt scheduled for OV

## 2023-10-25 ENCOUNTER — Ambulatory Visit: Payer: Medicare Other | Admitting: Family

## 2023-11-09 ENCOUNTER — Ambulatory Visit: Payer: Medicare Other | Admitting: Family

## 2023-11-09 ENCOUNTER — Encounter: Payer: Self-pay | Admitting: Family

## 2023-11-09 VITALS — BP 92/56 | HR 75 | Temp 97.3°F | Ht 64.0 in | Wt 123.0 lb

## 2023-11-09 DIAGNOSIS — K2100 Gastro-esophageal reflux disease with esophagitis, without bleeding: Secondary | ICD-10-CM | POA: Diagnosis not present

## 2023-11-09 DIAGNOSIS — K5909 Other constipation: Secondary | ICD-10-CM

## 2023-11-09 DIAGNOSIS — F332 Major depressive disorder, recurrent severe without psychotic features: Secondary | ICD-10-CM | POA: Diagnosis not present

## 2023-11-09 MED ORDER — LINZESS 290 MCG PO CAPS: 290.0000 ug | ORAL_CAPSULE | Freq: Every morning | ORAL | 5 refills | Status: DC

## 2023-11-09 MED ORDER — OMEPRAZOLE 20 MG PO CPDR: 20.0000 mg | DELAYED_RELEASE_CAPSULE | Freq: Every day | ORAL | 5 refills | Status: DC

## 2023-11-09 MED ORDER — DOCUSATE SODIUM 100 MG PO CAPS: 100.0000 mg | ORAL_CAPSULE | Freq: Two times a day (BID) | ORAL | 5 refills | Status: DC | PRN

## 2023-11-09 NOTE — Assessment & Plan Note (Signed)
Reports abdominal pain with difficulty tolerating food and water. History of normal colonoscopy and endoscopy. Currently on Linzess for constipation. -Recommend avoiding acidic foods and caffeine. -Resending Omeprazole as previously sent but pt unable to get to due to cost. -Pt advised to notify office if any issues getting her meds or if sx persist.

## 2023-11-09 NOTE — Progress Notes (Signed)
Patient ID: Valerie Richards, female    DOB: 02/09/1971, 53 y.o.   MRN: [id number]  Chief Complaint  Patient presents with   Depression    Pt c/o depression sx, Pt changed insurance and needs a referral to a psych.    Abdominal Pain    Pt c/o abdominal pain and bloating after every meal. Present for a few months. Pt states she craves sweets.    Discussed the use of AI scribe software for clinical note transcription with the patient, who gave verbal consent to proceed.  History of Present Illness   The patient presents with abdominal pain and depression. The patient experiences abdominal pain that is distinct from rib pain and is exacerbated by even water. She has not been on any antacid medication. Previous colonoscopy and gastric evaluation last year were normal. She requires Linzess for regular bowel movements and experiences constipation without it. She cannot afford MiraLAX or other stool softeners due to insurance issues. She expresses significant mental health concerns, including panic attacks, difficulty breathing, and bad dreams. She has been unable to see her counselor or psychiatrist due to insurance changes and non-responsiveness from providers. She is on Lamictal but has not transitioned to Trintellix as previously discussed with her Psych provider. Panic attacks are worsening, with difficulty being around people and feelings of isolation. She mentions financial difficulties in affording medications due to changes in her insurance, which now requires co-pays for prescriptions and visits. She previously had [employer] [facility] and now has [employer] and Medicaid, increasing her out-of-pocket costs to $4 per RX.     Assessment & Plan:     Abdominal Pain/GERD - Reports abdominal pain with difficulty tolerating food and water. History of normal colonoscopy and endoscopy. Currently on Linzess for constipation. -Recommend avoiding acidic foods and caffeine. -Resending  Omeprazole as previously sent but pt unable to get to due to cost. -Pt advised to notify office if any issues getting her meds or if sx persist.  Constipation - Reports daily bowel movements with the aid of Linzess, but struggles without it and sometimes it still doesn't help. -Continue Linzess qd, sending refill. -Sending RX for generic Colace stool softener bid prn to ensure daily soft BM. -F/U 6mos or prn  Chronic anxiety & depression - Reports worsening panic attacks, difficulty sleeping, and social isolation. Difficulty accessing psychiatric care and counseling possibly due to insurance changes. -will reach out to referral nurse to contact psychiatric provider and counseling services to facilitate appointments. -Continue Klonopin as needed for panic attacks, Lamictal daily and Elavil qhs as previously ordered. -Will continue to monitor.     Subjective:    Outpatient Medications Prior to Visit  Medication Sig Dispense Refill   amitriptyline (ELAVIL) 150 MG tablet Take 1 tablet (150 mg total) by mouth at bedtime. 30 tablet 0   Cholecalciferol (VITAMIN D3) 125 MCG (5000 UT) CAPS Take 1 capsule (5,000 Units total) by mouth daily. 90 capsule 3   conjugated estrogens (PREMARIN) vaginal cream Place 0.5 Applicatorfuls vaginally 2 (two) times a week. 42.5 g 1   Fremanezumab-vfrm (AJOVY) 225 MG/1.5ML SOAJ Inject 225 mg into the skin every 28 (twenty-eight) days. 1.68 mL 11   lamoTRIgine (LAMICTAL) 25 MG tablet Take 25 mg by mouth daily.     lidocaine (LIDODERM) 5 % Place 1 patch onto the skin daily. Remove & Discard patch within 12 hours or as directed by MD 30 patch 0   LINZESS 290 MCG CAPS capsule Take 1  capsule (290 mcg total) by mouth every morning. 30 capsule 3   omeprazole (PRILOSEC) 20 MG capsule Take 1 capsule (20 mg total) by mouth daily. START with 1 capsule bid for 2 weeks, then reduce to qd. 45 capsule 5   ondansetron (ZOFRAN) 4 MG tablet Take 1 tablet (4 mg total) by mouth every 8  (eight) hours as needed for nausea or vomiting. 20 tablet 0   ondansetron (ZOFRAN-ODT) 4 MG disintegrating tablet 4mg  ODT q4 hours prn nausea/vomit 20 tablet 0   Rimegepant Sulfate (NURTEC) 75 MG TBDP Take 1 tablet (75 mg total) by mouth as needed (Take one tablet as needed at the earlist onset of a Migraine.  Max 1 tablet in 24 hours). 16 tablet 5   tiZANidine (ZANAFLEX) 4 MG tablet Take 1 tablet (4 mg total) by mouth 3 (three) times daily as needed for muscle spasms. 90 tablet 5   benzonatate (TESSALON) 100 MG capsule Take 1 capsule (100 mg total) by mouth every 8 (eight) hours. (Patient not taking: Reported on 11/09/2023) 21 capsule 0   clonazePAM (KLONOPIN) 0.5 MG tablet Take 1 tablet (0.5 mg total) by mouth 2 (two) times daily. 60 tablet 0   No facility-administered medications prior to visit.   Past Medical History:  Diagnosis Date   Acute recurrent ethmoidal sinusitis 09/03/2018   Anemia    iron deficiency   Anxiety    Depression    Fibroid    Menorrhagia 01/20/2021   Migraine    Nasal pain 09/03/2018   Stroke (HCC) 12/2020   See MRI.   Vaginal discharge 01/20/2021   Vaginitis and vulvovaginitis 01/20/2021   Past Surgical History:  Procedure Laterality Date   EYE SURGERY Bilateral    TONSILLECTOMY     No Known Allergies    Objective:    Physical Exam Vitals and nursing note reviewed.  Constitutional:      Appearance: Normal appearance.  Cardiovascular:     Rate and Rhythm: Normal rate and regular rhythm.  Pulmonary:     Effort: Pulmonary effort is normal.     Breath sounds: Normal breath sounds.  Musculoskeletal:        General: Normal range of motion.  Skin:    General: Skin is warm and dry.  Neurological:     Mental Status: She is alert.  Psychiatric:        Mood and Affect: Mood normal.        Behavior: Behavior normal.   BP (!) 92/56 (BP Location: Left Arm, Patient Position: Sitting, Cuff Size: Normal)   Pulse 75   Temp (!) 97.3 F (36.3 C) (Temporal)    Ht 5\' 4"  (1.626 m)   Wt 123 lb (55.8 kg)   LMP 02/26/2020 (Exact Date)   SpO2 98%   BMI 21.11 kg/m  Wt Readings from Last 3 Encounters:  11/09/23 123 lb (55.8 kg)  10/17/23 119 lb 0.8 oz (54 kg)  10/17/23 121 lb (54.9 kg)       Dulce Sellar, NP

## 2023-11-09 NOTE — Assessment & Plan Note (Signed)
Reports worsening panic attacks, difficulty sleeping, and social isolation. Difficulty accessing psychiatric care and counseling due to insurance changes. -will reach out to referral nurse to contact psychiatric provider and counseling services to facilitate appointments. -Continue Klonopin as needed for panic attacks, Lamictal daily and Elavil qhs as previously ordered. -Will continue to monitor.

## 2023-11-09 NOTE — Assessment & Plan Note (Signed)
Reports daily bowel movements with the aid of Linzess, but struggles without it and sometimes it still doesn't help. -Continue Linzess qd, sending refill. -Sending RX for generic Colace stool softener bid prn to ensure daily soft BM. -F/U 6mos or prn

## 2023-11-09 NOTE — Assessment & Plan Note (Deleted)
Reports abdominal pain with difficulty tolerating food and water. History of normal colonoscopy and endoscopy. Currently on Linzess for constipation. -Recommend avoiding acidic foods and caffeine. -Resending Omeprazole as previously sent but pt unable to get to due to cost. -Pt advised to notify office if any issues getting her meds or if sx persist.

## 2023-11-24 ENCOUNTER — Telehealth (HOSPITAL_COMMUNITY): Payer: Medicaid Other | Admitting: Psychiatry

## 2023-11-24 ENCOUNTER — Encounter (HOSPITAL_COMMUNITY): Payer: Self-pay | Admitting: Psychiatry

## 2023-11-24 VITALS — Wt 123.0 lb

## 2023-11-24 DIAGNOSIS — F4312 Post-traumatic stress disorder, chronic: Secondary | ICD-10-CM | POA: Diagnosis not present

## 2023-11-24 DIAGNOSIS — F410 Panic disorder [episodic paroxysmal anxiety] without agoraphobia: Secondary | ICD-10-CM

## 2023-11-24 DIAGNOSIS — F323 Major depressive disorder, single episode, severe with psychotic features: Secondary | ICD-10-CM | POA: Diagnosis not present

## 2023-11-24 MED ORDER — CLONAZEPAM 0.5 MG PO TABS: 0.5000 mg | ORAL_TABLET | Freq: Two times a day (BID) | ORAL | 1 refills | Status: DC

## 2023-11-24 MED ORDER — AMITRIPTYLINE HCL 100 MG PO TABS: 200.0000 mg | ORAL_TABLET | Freq: Every day | ORAL | 1 refills | Status: DC

## 2023-11-24 NOTE — Progress Notes (Signed)
BH MD/PA/NP OP Progress Note  Virtual Visit via Video Note  I connected with Valerie Richards on 11/24/23 at 10:20 AM EST by a video enabled telemedicine application and verified that I am speaking with the correct person using two identifiers.  Location: Patient: Home Provider: Home Office   I discussed the limitations of evaluation and management by telemedicine and the availability of in person appointments. The patient expressed understanding and agreed to proceed.  11/24/2023 9:56 AM Valerie Richards  MRN:  [id number]  Chief Complaint:  Chief Complaint  Patient presents with   Anxiety   Follow-up   HPI: Patient is evaluated by video session.  She had missed appointment .She was last seen in October and she is non-compliant with meds and therapy due to change in insurance. Her symptoms are worsening. Her daughter getting married and moving to [place]. She is very concerns as no support other than son. Usually her daughter takes her to appointments. Reported increase in nightmares and insomnia. Frequent crying spells and fatigue. Since last appointment admitted twice for pneumonia. Recently finished ABX. We tried Trintellix but not consistent with meds. Out of klonopin and worsening of panic attacks. No suicidal thoughts. No agitation or psychosis.Extremely worried about family back home. Sleep on and off. No drugs or etoh. Appetite fair.    Visit Diagnosis:    ICD-10-CM   1. Severe major depression with psychotic features (HCC)  F32.3 amitriptyline (ELAVIL) 100 MG tablet    clonazePAM (KLONOPIN) 0.5 MG tablet    2. Chronic post-traumatic stress disorder (PTSD)  F43.12 clonazePAM (KLONOPIN) 0.5 MG tablet    3. Panic attacks  F41.0 clonazePAM (KLONOPIN) 0.5 MG tablet      Past Psychiatric History:  H/O depression, anxiety, childhood trauma and panic attack.  Saw provider few years ago at [facility] and prescribed Lexapro, Prozac, Effexor, mirtazapine  and trazodone but did not felt  it helped.  Prescribed Zoloft, Ambien and hydroxyzine by PCP.  We tried Cymbalta and Abilify. She was recommended inpatient years ago but did not stay in the hospital.   Past Medical History:  Past Medical History:  Diagnosis Date   Acute recurrent ethmoidal sinusitis 09/03/2018   Anemia    iron deficiency   Anxiety    Depression    Fibroid    Menorrhagia 01/20/2021   Migraine    Nasal pain 09/03/2018   Stroke (HCC) 12/2020   See MRI.   Vaginal discharge 01/20/2021   Vaginitis and vulvovaginitis 01/20/2021    Past Surgical History:  Procedure Laterality Date   EYE SURGERY Bilateral    TONSILLECTOMY      Family Psychiatric History: revived   Family History:  Family History  Problem Relation Age of Onset   Hypertension Mother    Cancer Father    Diabetes Father    Colon cancer Father    Kidney failure Father    Diabetes Brother     Social History:  Social History   Socioeconomic History   Marital status: Widowed    Spouse name: Not on file   Number of children: 5   Years of education: Not on file   Highest education level: Not on file  Occupational History   Occupation: Conservation officer, nature    Comment: Surveyor, minerals  Tobacco Use   Smoking status: Former    Current packs/day: 0.00    Average packs/day: 0.3 packs/day for 8.0 years (2.0 ttl pk-yrs)    Types: Cigarettes    Start date:  10/10/2004    Quit date: 10/10/2012    Years since quitting: 11.1   Smokeless tobacco: Never  Vaping Use   Vaping status: Never Used  Substance and Sexual Activity   Alcohol use: No    Alcohol/week: 0.0 standard drinks of alcohol   Drug use: No   Sexual activity: Not Currently    Partners: Male    Birth control/protection: Post-menopausal  Other Topics Concern   Not on file  Social History Narrative   Widowed 25-Dec-2015. Husband died from [place] Gehrig's disease.    Right handed drinks caffeine   2 story home   [employer]   Social Drivers of Health   Financial Resource Strain:  Not on file  Food Insecurity: Not on file  Transportation Needs: Not on file  Physical Activity: Not on file  Stress: Not on file  Social Connections: Unknown (02/18/2022)   [facility]   Social Network    Social Network: Not on file    Allergies: No Known Allergies  Metabolic Disorder Labs: No results found for: "HGBA1C", "MPG" Lab Results  Component Value Date   PROLACTIN 17.4 11/17/2017   Lab Results  Component Value Date   CHOL 189 11/23/2022   TRIG 64.0 11/23/2022   HDL 80.40 11/23/2022   CHOLHDL 2 11/23/2022   VLDL 12.8 11/23/2022   LDLCALC 96 11/23/2022   LDLCALC 67 11/17/2017   Lab Results  Component Value Date   TSH 2.15 11/23/2022   TSH 1.490 04/29/2019    Therapeutic Level Labs: No results found for: "LITHIUM" No results found for: "VALPROATE" No results found for: "CBMZ"  Current Medications: Current Outpatient Medications  Medication Sig Dispense Refill   amitriptyline (ELAVIL) 150 MG tablet Take 1 tablet (150 mg total) by mouth at bedtime. 30 tablet 0   Cholecalciferol (VITAMIN D3) 125 MCG (5000 UT) CAPS Take 1 capsule (5,000 Units total) by mouth daily. 90 capsule 3   clonazePAM (KLONOPIN) 0.5 MG tablet Take 1 tablet (0.5 mg total) by mouth 2 (two) times daily. 60 tablet 0   conjugated estrogens (PREMARIN) vaginal cream Place 0.5 Applicatorfuls vaginally 2 (two) times a week. 42.5 g 1   docusate sodium (COLACE) 100 MG capsule Take 1 capsule (100 mg total) by mouth 2 (two) times daily as needed for moderate constipation. 60 capsule 5   Fremanezumab-vfrm (AJOVY) 225 MG/1.5ML SOAJ Inject 225 mg into the skin every 28 (twenty-eight) days. 1.68 mL 11   lamoTRIgine (LAMICTAL) 25 MG tablet Take 25 mg by mouth daily.     lidocaine (LIDODERM) 5 % Place 1 patch onto the skin daily. Remove & Discard patch within 12 hours or as directed by MD 30 patch 0   LINZESS 290 MCG CAPS capsule Take 1 capsule (290 mcg total) by mouth every  morning. 30 capsule 5   omeprazole (PRILOSEC) 20 MG capsule Take 1 capsule (20 mg total) by mouth daily. 30 capsule 5   ondansetron (ZOFRAN) 4 MG tablet Take 1 tablet (4 mg total) by mouth every 8 (eight) hours as needed for nausea or vomiting. 20 tablet 0   ondansetron (ZOFRAN-ODT) 4 MG disintegrating tablet 4mg  ODT q4 hours prn nausea/vomit 20 tablet 0   Rimegepant Sulfate (NURTEC) 75 MG TBDP Take 1 tablet (75 mg total) by mouth as needed (Take one tablet as needed at the earlist onset of a Migraine.  Max 1 tablet in 24 hours). 16 tablet 5   tiZANidine (ZANAFLEX) 4 MG tablet Take 1  tablet (4 mg total) by mouth 3 (three) times daily as needed for muscle spasms. 90 tablet 5   No current facility-administered medications for this visit.     Musculoskeletal: Strength & Muscle Tone: within normal limits Gait & Station: normal Patient leans: N/A  Psychiatric Specialty Exam: Review of Systems  Psychiatric/Behavioral:  Positive for dysphoric mood. The patient is nervous/anxious.     Weight 123 lb (55.8 kg), last menstrual period 02/26/2020.There is no height or weight on file to calculate BMI.  General Appearance: Casual  Eye Contact:  Fair  Speech:  Slow  Volume:  Decreased  Mood:  Anxious and Dysphoric  Affect:  Constricted and Depressed  Thought Process:  Goal Directed  Orientation:  Full (Time, Place, and Person)  Thought Content: Rumination   Suicidal Thoughts:  No  Homicidal Thoughts:  No  Memory:  Immediate;   Fair Recent;   Good Remote;   Fair  Judgement:  Fair  Insight:  Fair  Psychomotor Activity:  Decreased  Concentration:  Concentration: Fair and Attention Span: Fair  Recall:  Good  Fund of Knowledge: Good  Language: Good  Akathisia:  No  Handed:  Right  AIMS (if indicated): not done  Assets:  Communication Skills Desire for Improvement Housing Social Support  ADL's:  Intact  Cognition: WNL  Sleep:  Fair   Screenings: GAD-7    Garment/textile technologist Visit  from 11/09/2023 in [facility] [employer] [facility] from 08/10/2023 in [facility] [employer] [facility] from 05/29/2023 in [facility] [employer] [facility] from 05/10/2023 in [facility] [employer] [facility] from 11/14/2022 in [facility]  Total GAD-7 Score 21 21 21 21 18       [employer]    Flowsheet Row Office Visit from 11/09/2023 in [facility] [employer] [facility] Visit from 08/10/2023 in [facility] [employer] [facility] from 05/29/2023 in [facility] [employer] [facility] from 05[facility]10/2023 in [facility] [employer] [facility] from 03/09/2023 in [facility] [place]  PHQ-2 Total Score 5 5 6 6 5   PHQ-9 Total Score 24 24 27 25 23       Flowsheet Row ED from 10/19/2023 in [facility] Department at [facility] ED from 10/17/2023 in [facility] Emergency Department at [facility] ED from 03/27/2023 in [facility] Department at [facility]  C-SSRS RISK CATEGORY No Risk No Risk No Risk        Assessment and Plan: I review labs and psychosocial stress. Daughter moving after getting married. Little support. Not taking meds as ran out. Recommended to try increasing elavil 200 mg however if cannot tolerate than back to 150 mg. Keep klonopin  mg twice a day. Not taking Lamictal after did not see it was effective. Will discontinue that. Labs are ok. Refer to therapy. F/u in six weeks.   Collaboration of Care: Collaboration of Care: Other provider involved in patient's care AEB notes in epic to review  Patient/Guardian was advised Release of Information must be obtained prior to any record release in order to collaborate their care with an outside provider. Patient/Guardian was advised  if they have not already done so to contact the registration department to sign all necessary forms  in order for [place] to release information regarding their care.   Consent: Patient/Guardian gives verbal consent for treatment and assignment of benefits for services provided during this visit. Patient/Guardian expressed understanding and agreed to proceed.    Follow Up Instructions:    I discussed the assessment and treatment plan with the patient. The patient was provided an opportunity to ask questions and all were answered. The patient agreed with the plan and demonstrated an understanding of the instructions.   The patient was advised to call back or seek an in-person evaluation if the symptoms worsen or if the condition fails to improve as anticipated.  I provided 35 minutes of non-face-to-face time during this encounter.   Cleotis Nipper, MD 11/24/2023, 9:56 AM

## 2023-12-19 ENCOUNTER — Encounter (HOSPITAL_COMMUNITY): Payer: Self-pay | Admitting: Licensed Clinical Social Worker

## 2023-12-19 ENCOUNTER — Encounter (HOSPITAL_COMMUNITY): Payer: Self-pay

## 2023-12-19 ENCOUNTER — Ambulatory Visit (INDEPENDENT_AMBULATORY_CARE_PROVIDER_SITE_OTHER): Payer: Medicare Other | Admitting: Licensed Clinical Social Worker

## 2023-12-19 DIAGNOSIS — F410 Panic disorder [episodic paroxysmal anxiety] without agoraphobia: Secondary | ICD-10-CM

## 2023-12-19 DIAGNOSIS — F4312 Post-traumatic stress disorder, chronic: Secondary | ICD-10-CM

## 2023-12-19 DIAGNOSIS — F323 Major depressive disorder, single episode, severe with psychotic features: Secondary | ICD-10-CM
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
## 2023-12-22 ENCOUNTER — Encounter: Payer: Self-pay | Admitting: Physician Assistant

## 2023-12-22 ENCOUNTER — Ambulatory Visit (INDEPENDENT_AMBULATORY_CARE_PROVIDER_SITE_OTHER): Admitting: Physician Assistant

## 2023-12-22 VITALS — BP 110/60 | HR 64 | Temp 97.2°F | Ht 64.0 in | Wt 127.0 lb

## 2023-12-22 DIAGNOSIS — R42 Dizziness and giddiness: Secondary | ICD-10-CM

## 2023-12-22 DIAGNOSIS — R109 Unspecified abdominal pain: Secondary | ICD-10-CM | POA: Diagnosis not present

## 2023-12-22 DIAGNOSIS — L609 Nail disorder, unspecified: Secondary | ICD-10-CM

## 2023-12-22 MED ORDER — PANTOPRAZOLE SODIUM 40 MG PO TBEC: 40.0000 mg | DELAYED_RELEASE_TABLET | Freq: Every day | ORAL | 0 refills | Status: DC

## 2023-12-22 NOTE — Progress Notes (Signed)
 Valerie Richards is a 53 y.o. female here for a new problem.  History of Present Illness:   Chief Complaint  Patient presents with   Nail Problem    Pt c/o black spot on 2nd toe on right foot x 6 months and getting larger, denies pain.   Dizziness    Pt c/o dizziness everyday for the past 2 weeks with activity, lasting less than one minute.    HPI  Black dot on toe: Pt complains of a black dot on her 3rd toe right foot, noticed about 6 months ago.  Does not recall any recent trauma.  Denies any pain.  Not established with dermatology.   Dizziness/lightheadedness: Endorses daily episodes of dizziness/lightheadedness and nausea, worsening in the last 2 weeks.  Has always had occasional dizziness/lightheaded with quick positional changes, worsening in the last 2 weeks.  Her most recent episode was yesterday while trying to get out of her car.  She endorses associated heart pounding.  Has 1-3 episodes per day.  States she has almost fallen before during these episodes.  Reports increased stress may be contributing to recent increase in frequency of episodes. Lately she has been drinking more water than she used to.  Denies any spinning sensation or ear pressure.  She also reports right-sided facial numbness.  She is established with Dr. Everlena Cooper of neurology and has had an MRI for this in the past.   Abdominal discomfort: Has been eating unhealthy sweet foods. Reports abdominal discomfort after eating with no specific food triggers.  Has had colonoscopy and an upper endoscopy due to constipation.  Was previously on pantoprazole, is now on omeprazole.   Past Medical History:  Diagnosis Date   Acute recurrent ethmoidal sinusitis 09/03/2018   Anemia    iron deficiency   Anxiety    Depression    Fibroid    Menorrhagia 01/20/2021   Migraine    Nasal pain 09/03/2018   Stroke (HCC) 12/2020   See MRI.   Vaginal discharge 01/20/2021   Vaginitis and vulvovaginitis 01/20/2021      Social History   Tobacco Use   Smoking status: Former    Current packs/day: 0.00    Average packs/day: 0.3 packs/day for 8.0 years (2.0 ttl pk-yrs)    Types: Cigarettes    Start date: 10/10/2004    Quit date: 10/10/2012    Years since quitting: 11.2   Smokeless tobacco: Never  Vaping Use   Vaping status: Never Used  Substance Use Topics   Alcohol use: No    Alcohol/week: 0.0 standard drinks of alcohol   Drug use: No    Past Surgical History:  Procedure Laterality Date   EYE SURGERY Bilateral    TONSILLECTOMY      Family History  Problem Relation Age of Onset   Hypertension Mother    Cancer Father    Diabetes Father    Colon cancer Father    Kidney failure Father    Diabetes Brother     No Known Allergies  Current Medications:   Current Outpatient Medications:    amitriptyline (ELAVIL) 100 MG tablet, Take 2 tablets (200 mg total) by mouth at bedtime., Disp: 60 tablet, Rfl: 1   Cholecalciferol (VITAMIN D3) 125 MCG (5000 UT) CAPS, Take 1 capsule (5,000 Units total) by mouth daily., Disp: 90 capsule, Rfl: 3   clonazePAM (KLONOPIN) 0.5 MG tablet, Take 1 tablet (0.5 mg total) by mouth 2 (two) times daily., Disp: 60 tablet, Rfl: 1   Fremanezumab-vfrm (AJOVY)  225 MG/1.5ML SOAJ, Inject 225 mg into the skin every 28 (twenty-eight) days., Disp: 1.68 mL, Rfl: 11   LINZESS 290 MCG CAPS capsule, Take 1 capsule (290 mcg total) by mouth every morning., Disp: 30 capsule, Rfl: 5   ondansetron (ZOFRAN) 4 MG tablet, Take 1 tablet (4 mg total) by mouth every 8 (eight) hours as needed for nausea or vomiting., Disp: 20 tablet, Rfl: 0   ondansetron (ZOFRAN-ODT) 4 MG disintegrating tablet, 4mg  ODT q4 hours prn nausea/vomit, Disp: 20 tablet, Rfl: 0   pantoprazole (PROTONIX) 40 MG tablet, Take 1 tablet (40 mg total) by mouth daily., Disp: 90 tablet, Rfl: 0   Rimegepant Sulfate (NURTEC) 75 MG TBDP, Take 1 tablet (75 mg total) by mouth as needed (Take one tablet as needed at the earlist onset of a  Migraine.  Max 1 tablet in 24 hours)., Disp: 16 tablet, Rfl: 5   docusate sodium (COLACE) 100 MG capsule, Take 1 capsule (100 mg total) by mouth 2 (two) times daily as needed for moderate constipation. (Patient not taking: Reported on 12/22/2023), Disp: 60 capsule, Rfl: 5   Review of Systems:   Negative unless otherwise specified per HPI.  Vitals:   Vitals:   12/22/23 1137  BP: 110/60  Pulse: 64  Temp: (!) 97.2 F (36.2 C)  TempSrc: Temporal  SpO2: 99%  Weight: 127 lb (57.6 kg)  Height: 5\' 4"  (1.626 m)     Body mass index is 21.8 kg/m.  Physical Exam:   Physical Exam Vitals and nursing note reviewed.  Constitutional:      General: She is not in acute distress.    Appearance: She is well-developed. She is not ill-appearing or toxic-appearing.  HENT:     Head: Normocephalic and atraumatic.     Right Ear: Tympanic membrane, ear canal and external ear normal. Tympanic membrane is not erythematous, retracted or bulging.     Left Ear: Tympanic membrane, ear canal and external ear normal. Tympanic membrane is not erythematous, retracted or bulging.     Nose: Nose normal.     Right Sinus: No maxillary sinus tenderness or frontal sinus tenderness.     Left Sinus: No maxillary sinus tenderness or frontal sinus tenderness.     Mouth/Throat:     Pharynx: Uvula midline. No posterior oropharyngeal erythema.  Eyes:     General: Lids are normal.     Conjunctiva/sclera: Conjunctivae normal.  Neck:     Trachea: Trachea normal.  Cardiovascular:     Rate and Rhythm: Normal rate and regular rhythm.     Pulses: Normal pulses.     Heart sounds: Normal heart sounds, S1 normal and S2 normal.  Pulmonary:     Effort: Pulmonary effort is normal.     Breath sounds: Normal breath sounds. No decreased breath sounds, wheezing, rhonchi or rales.  Lymphadenopathy:     Cervical: No cervical adenopathy.  Skin:    General: Skin is warm and dry.     Comments: 3rd toe on right foot with hyperpigmented  raised nail approx 3.5 mm from edge of nail fold  Neurological:     General: No focal deficit present.     Mental Status: She is alert.     GCS: GCS eye subscore is 4. GCS verbal subscore is 5. GCS motor subscore is 6.     Cranial Nerves: Cranial nerves 2-12 are intact.     Sensory: Sensation is intact.     Motor: Motor function is intact.  Coordination: Coordination is intact.     Gait: Gait is intact.     Comments: Did not attempt dix hallpike with patient   Psychiatric:        Speech: Speech normal.        Behavior: Behavior normal. Behavior is cooperative.     Assessment and Plan:   Nail abnormality (Primary) - Ambulatory referral to Dermatology Possible tinea? Trial topical fungi-nail but refer to dermatology per patient request for complete evaluation  Dizziness Neurology exam benign She has seen neurology in the past for this, has had MRI; continues to see neurology I suspect possible benign paroxysmal positional vertigo (BPPV) but discussed that there are other causes of dizziness as well I did recommend vestibular rehab physical therapy referral but she declined She has asked to try "Epley" maneuver at home and if no improvement, she will let us know If any new/worsening symptom(s), I discussed that she needs to reach out and let us know or seek urgent/emergent care in ER  Low threshold to obtain MRI if symptom(s) persist  Abdominal discomfort Ongoing issue Will change Prilosec to protonix per patient request Follow up with gastrointestinal or Primary Care Provider (PCP) for ongoing management of this issue   I, Isabelle Course, acting as a Neurosurgeon for Jarold Motto, [place]., have documented all relevant documentation on the behalf of Jarold Motto, [place], as directed by  Jarold Motto, PA while in the presence of Jarold Motto, [place].  I, Jarold Motto, [place], have reviewed all documentation for this visit. The documentation on 12/22/23 for the exam, diagnosis,  procedures, and orders are all accurate and complete.  I spent a total of 43 minutes on this visit, today 12/22/23, which included reviewing previous notes from neurology, discussing plan of care with patient and using shared-decision making on next steps, refilling medications, and documenting the findings in the note.   Jarold Motto, PA-C

## 2023-12-22 NOTE — Patient Instructions (Signed)
 It was great to see you!  Dermatology referral Trial the OTC (available over the counter without a prescription) funginail for your toenail   Start protonix and replace your Prilosec/omeprazole with this  Trial the "Epley" maneuver -- if no improvement, please let us know If new symptom(s), please let us know    Take care,  Jarold Motto PA-C

## 2024-01-04 ENCOUNTER — Ambulatory Visit (INDEPENDENT_AMBULATORY_CARE_PROVIDER_SITE_OTHER): Admitting: Licensed Clinical Social Worker

## 2024-01-04 ENCOUNTER — Encounter (HOSPITAL_COMMUNITY): Payer: Self-pay | Admitting: Licensed Clinical Social Worker

## 2024-01-04 DIAGNOSIS — F4310 Post-traumatic stress disorder, unspecified: Secondary | ICD-10-CM | POA: Diagnosis not present

## 2024-01-04 DIAGNOSIS — F323 Major depressive disorder, single episode, severe with psychotic features: Secondary | ICD-10-CM | POA: Diagnosis not present

## 2024-01-04 NOTE — Progress Notes (Signed)
 Virtual Visit via Video Note  I connected with Valerie Richards on 01/04/24 at 12:30 PM EDT by a video enabled telemedicine application and verified that I am speaking with the correct person using two identifiers.  Location: Patient: home Provider: home office   I discussed the limitations of evaluation and management by telemedicine and the availability of in person appointments. The patient expressed understanding and agreed to proceed.   I discussed the assessment and treatment plan with the patient. The patient was provided an opportunity to ask questions and all were answered. The patient agreed with the plan and demonstrated an understanding of the instructions.   The patient was advised to call back or seek an in-person evaluation if the symptoms worsen or if the condition fails to improve as anticipated.  I provided 45 minutes of non-face-to-face time during this encounter.   Veneda Melter, LCSW   THERAPIST PROGRESS NOTE  Session Time: 12:30pm-1:15pm  Participation Level: Active  Behavioral Response: NeatAlertAnxious and Depressed  Type of Therapy: Individual Therapy  Treatment Goals addressed: LTG: Recall traumatic events without becoming overwhelmed with negative emotions [facility] CCP Acute or Chronic Trauma Reaction) Disciplines:  Interdisciplinary, PROVIDER Expected end:  06/20/24 STG: Valerie "Tobi Bastos" will verbalize an increased sense of mastery over PTSD symptoms by using several techniques to cope with flashbacks, decrease the power of triggers, and decrease negative thinking (BH CCP Acute or Chronic Trauma Reaction) Disciplines:  Interdisciplinary, PROVIDER Expected end:  06/20/24  ProgressTowards Goals: Progressing  Interventions: Motivational Interviewing  Summary: Valerie Richards is a 53 y.o. 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   Suicidal/Homicidal: Nowithout intent/plan  Therapist Response: Bijal engaged well in  individual virtual session with clinician. Clinician utilized MI OARS to reflect and summarize thoughts and feelings over the past couple of weeks. Clinician explored mood, interactions, and activities. Clinician built rapport with Valerie Richards regarding her family, religious support system, and daily routine. Uzma shared that she has not left her home since assessment on 3/11. She also reports that she will not go outside to sit in her yard, and if she goes out to get the mail, she runs and often will carry a stick or some other "weapon" for protection. Clinician explored self-care and ways that she treats her anxiety. Clinician taught deep breathing and positive thought processes. Clinician also noted a lot of fear and worry about her husband's death. Clinician identified the need to process this event and to allow herself to release this guilt and shame.   Plan: Return again in 2-3 weeks.  Diagnosis: Severe major depression with psychotic features (HCC)  PTSD (post-traumatic stress disorder)  Collaboration of Care: Psychiatrist AEB communicated concerns about medication for anxiety. Valerie Richards will see Dr. Lolly Mustache tomorrow to discuss meds to take before going out to reduce instance of anxiety/panic attack in public.    Patient/Guardian was advised Release of Information must be obtained prior to any record release in order to collaborate their care with an outside provider. Patient/Guardian was advised if they have not already done so to contact the registration department to sign all necessary forms in order for [place] to release information regarding their care.   Consent: Patient/Guardian gives verbal consent for treatment and assignment of benefits for services provided during this visit. Patient/Guardian expressed understanding and agreed to proceed.   Chryl Heck Bolan, LCSW 01/04/2024

## 2024-01-05 ENCOUNTER — Telehealth (HOSPITAL_COMMUNITY): Payer: Medicare Other | Admitting: Psychiatry

## 2024-01-05 ENCOUNTER — Encounter (HOSPITAL_COMMUNITY): Payer: Self-pay | Admitting: Psychiatry

## 2024-01-05 VITALS — Wt 127.0 lb

## 2024-01-05 DIAGNOSIS — F410 Panic disorder [episodic paroxysmal anxiety] without agoraphobia: Secondary | ICD-10-CM | POA: Diagnosis not present

## 2024-01-05 DIAGNOSIS — F323 Major depressive disorder, single episode, severe with psychotic features: Secondary | ICD-10-CM | POA: Diagnosis not present

## 2024-01-05 DIAGNOSIS — F4312 Post-traumatic stress disorder, chronic: Secondary | ICD-10-CM | POA: Diagnosis not present

## 2024-01-05 MED ORDER — AMITRIPTYLINE HCL 100 MG PO TABS
200.0000 mg | ORAL_TABLET | Freq: Every day | ORAL | 1 refills | Status: AC
Start: 2024-01-05 — End: ?

## 2024-01-05 MED ORDER — CLONAZEPAM 0.5 MG PO TABS: 0.5000 mg | ORAL_TABLET | Freq: Two times a day (BID) | ORAL | 1 refills | Status: DC

## 2024-01-05 NOTE — Progress Notes (Signed)
 [facility] MD Virtual Progress Note   Patient Location: Home Provider Location: Home Office  I connect with patient by video and verified that I am speaking with correct person by using two identifiers. I discussed the limitations of evaluation and management by telemedicine and the availability of in person appointments. I also discussed with the patient that there may be a patient responsible charge related to this service. The patient expressed understanding and agreed to proceed.  Valerie Richards [id number] 53 y.o.  01/05/2024 10:06 AM  History of Present Illness:  Patient is evaluated by video session.  On the last visit we increased amitriptyline and she is taking 200 mg at bedtime.  She admitted it helps the depression but sometimes still struggle with insomnia.  She is concerned because her daughter is getting married in May and then she will moved to [place].  She told she is going to miss her because her daughter is very supportive and take her to the places.  Patient told it is a holy month and she is trying to involvement neurology on and that is very helpful and relaxing.  She goals to mosque every night and pray.  She is also very happy because her son goes to and she is seeing him almost every day.  Usually per son brings her back home.  She started therapy and that has been very helpful.  She reported most of the days she feels okay but there are days when she feels very nervous, anxious and have panic attacks.  She admitted her appetite is improved and she is happy about it.  She does fast but when she open the fast she eats good.  She tried taking Klonopin but it make her very groggy and sleepy and she noticed that she cannot pray.  She has no tremor or shakes or any EPS.  She denies any suicidal thoughts or homicidal thoughts.  She has no major concern for the medication other than sedation from Klonopin.  Her nightmares and flashbacks are on and off and there are  nights when she is sleep very good without any nightmares.    Past Psychiatric History: H/O depression, anxiety, childhood trauma and panic attack.  Saw provider few years ago at [facility] and prescribed Lexapro, Prozac, Effexor, mirtazapine  and trazodone but did not felt it helped.  Prescribed Zoloft, Ambien and hydroxyzine by PCP.  We tried Cymbalta and Abilify. She was recommended inpatient years ago but did not stay in the hospital.    Outpatient Encounter Medications as of 01/05/2024  Medication Sig   amitriptyline (ELAVIL) 100 MG tablet Take 2 tablets (200 mg total) by mouth at bedtime.   Cholecalciferol (VITAMIN D3) 125 MCG (5000 UT) CAPS Take 1 capsule (5,000 Units total) by mouth daily.   clonazePAM (KLONOPIN) 0.5 MG tablet Take 1 tablet (0.5 mg total) by mouth 2 (two) times daily.   docusate sodium (COLACE) 100 MG capsule Take 1 capsule (100 mg total) by mouth 2 (two) times daily as needed for moderate constipation. (Patient not taking: Reported on 12/22/2023)   Fremanezumab-vfrm (AJOVY) 225 MG/1.5ML SOAJ Inject 225 mg into the skin every 28 (twenty-eight) days.   LINZESS 290 MCG CAPS capsule Take 1 capsule (290 mcg total) by mouth every morning.   ondansetron (ZOFRAN) 4 MG tablet Take 1 tablet (4 mg total) by mouth every 8 (eight) hours as needed for nausea or vomiting.   ondansetron (ZOFRAN-ODT) 4 MG disintegrating tablet 4mg  ODT q4 hours  prn nausea/vomit   pantoprazole (PROTONIX) 40 MG tablet Take 1 tablet (40 mg total) by mouth daily.   Rimegepant Sulfate (NURTEC) 75 MG TBDP Take 1 tablet (75 mg total) by mouth as needed (Take one tablet as needed at the earlist onset of a Migraine.  Max 1 tablet in 24 hours).   No facility-administered encounter medications on file as of 01/05/2024.    Recent Results (from the past 2160 hours)  POC Influenza A&B(BINAX/QUICKVUE)     Status: None   Collection Time: 10/17/23 11:34 AM  Result Value Ref Range   Influenza A, POC Negative Negative    Influenza B, POC Negative Negative  POCT rapid strep A     Status: None   Collection Time: 10/17/23 11:34 AM  Result Value Ref Range   Rapid Strep A Screen Negative Negative  POC COVID-19     Status: None   Collection Time: 10/17/23 11:34 AM  Result Value Ref Range   SARS Coronavirus 2 Ag Negative Negative  Basic metabolic panel     Status: Abnormal   Collection Time: 10/17/23 12:19 PM  Result Value Ref Range   Sodium 135 135 - 145 mmol/L   Potassium 4.0 3.5 - 5.1 mmol/L   Chloride 97 (L) 98 - 111 mmol/L   CO2 29 22 - 32 mmol/L   Glucose, Bld 122 (H) 70 - 99 mg/dL    Comment: Glucose reference range applies only to samples taken after fasting for at least 8 hours.   BUN 16 6 - 20 mg/dL   Creatinine, Ser 1.61 0.44 - 1.00 mg/dL   Calcium 8.9 8.9 - 09.6 mg/dL   GFR, Estimated >04 >54 mL/min    Comment: (NOTE) Calculated using the CKD-EPI Creatinine Equation (2021)    Anion gap 9 5 - 15    Comment: Performed at [facility], [address] [place], [place] [address]  CBC     Status: None   Collection Time: 10/17/23 12:19 PM  Result Value Ref Range   WBC 5.9 4.0 - 10.5 K/uL   RBC 4.23 3.87 - 5.11 MIL/uL   Hemoglobin 12.9 12.0 - 15.0 g/dL   HCT 91.4 78.2 - 95.6 %   MCV 89.8 80.0 - 100.0 fL   MCH 30.5 26.0 - 34.0 pg   MCHC 33.9 30.0 - 36.0 g/dL   RDW 21.3 08.6 - 57.8 %   Platelets 195 150 - 400 K/uL   nRBC 0.0 0.0 - 0.2 %    Comment: Performed at [facility], [address] [place], [place] [address]  Urinalysis, Routine w reflex microscopic -Urine, Clean Catch     Status: Abnormal   Collection Time: 10/17/23  2:24 PM  Result Value Ref Range   Color, Urine YELLOW YELLOW   APPearance HAZY (A) CLEAR   Specific Gravity, Urine 1.028 1.005 - 1.030   pH 6.0 5.0 - 8.0   Glucose, UA NEGATIVE NEGATIVE mg/dL   Hgb urine dipstick LARGE (A) NEGATIVE   Bilirubin Urine NEGATIVE NEGATIVE   Ketones, ur NEGATIVE NEGATIVE mg/dL   Protein, ur  952 (A) NEGATIVE mg/dL   Nitrite NEGATIVE NEGATIVE   Leukocytes,Ua NEGATIVE NEGATIVE   RBC / HPF >50 0 - 5 RBC/hpf   WBC, UA 0-5 0 - 5 WBC/hpf   Bacteria, UA RARE (A) NONE SEEN   Squamous Epithelial / HPF 0-5 0 - 5 /HPF   Mucus PRESENT     Comment: Performed at [facility], [phone]  [place], [place], [place] [address]  CBC with Differential     Status: Abnormal   Collection Time: 10/19/23 12:23 PM  Result Value Ref Range   WBC 5.5 4.0 - 10.5 K/uL   RBC 3.75 (L) 3.87 - 5.11 MIL/uL   Hemoglobin 11.2 (L) 12.0 - 15.0 g/dL   HCT 14.7 (L) 82.9 - 56.2 %   MCV 91.7 80.0 - 100.0 fL   MCH 29.9 26.0 - 34.0 pg   MCHC 32.6 30.0 - 36.0 g/dL   RDW 13.0 86.5 - 78.4 %   Platelets 156 150 - 400 K/uL   nRBC 0.0 0.0 - 0.2 %   Neutrophils Relative % 70 %   Neutro Abs 3.8 1.7 - 7.7 K/uL   Lymphocytes Relative 25 %   Lymphs Abs 1.4 0.7 - 4.0 K/uL   Monocytes Relative 5 %   Monocytes Absolute 0.3 0.1 - 1.0 K/uL   Eosinophils Relative 0 %   Eosinophils Absolute 0.0 0.0 - 0.5 K/uL   Basophils Relative 0 %   Basophils Absolute 0.0 0.0 - 0.1 K/uL   Immature Granulocytes 0 %   Abs Immature Granulocytes 0.01 0.00 - 0.07 K/uL    Comment: Performed at [facility], [address], [place], [place] [address]  Comprehensive metabolic panel     Status: Abnormal   Collection Time: 10/19/23 12:23 PM  Result Value Ref Range   Sodium 140 135 - 145 mmol/L   Potassium 4.2 3.5 - 5.1 mmol/L   Chloride 103 98 - 111 mmol/L   CO2 30 22 - 32 mmol/L   Glucose, Bld 94 70 - 99 mg/dL    Comment: Glucose reference range applies only to samples taken after fasting for at least 8 hours.   BUN 19 6 - 20 mg/dL   Creatinine, Ser 5.28 0.44 - 1.00 mg/dL   Calcium 9.0 8.9 - 41.3 mg/dL   Total Protein 6.3 (L) 6.5 - 8.1 g/dL   Albumin 3.7 3.5 - 5.0 g/dL   AST 16 15 - 41 U/L   ALT 12 0 - 44 U/L   Alkaline Phosphatase 38 38 - 126 U/L   Total Bilirubin 0.7 0.0 - 1.2 mg/dL   GFR, Estimated  >24 >40 mL/min    Comment: (NOTE) Calculated using the CKD-EPI Creatinine Equation (2021)    Anion gap 7 5 - 15    Comment: Performed at [facility], [address], [place], [place] [address]     Psychiatric Specialty Exam: Physical Exam  Review of Systems  Weight 127 lb (57.6 kg), last menstrual period 02/26/2020.There is no height or weight on file to calculate BMI.  General Appearance: Casual  Eye Contact:  Good  Speech:  Normal Rate  Volume:  Decreased  Mood:  Anxious and Dysphoric  Affect:  Appropriate  Thought Process:  Goal Directed  Orientation:  Full (Time, Place, and Person)  Thought Content:  Rumination  Suicidal Thoughts:  No  Homicidal Thoughts:  No  Memory:  Immediate;   Good Recent;   Good Remote;   Good  Judgement:  Fair  Insight:  Shallow  Psychomotor Activity:  Decreased  Concentration:  Concentration: Good and Attention Span: Good  Recall:  Good  Fund of Knowledge:  Fair  Language:  Good  Akathisia:  No  Handed:  Right  AIMS (if indicated):     Assets:  Communication Skills Desire for Improvement Housing Social Support  ADL's:  Intact  Cognition:  WNL  Sleep:  better  Assessment/Plan: Severe major depression with psychotic features (HCC) - Plan: amitriptyline (ELAVIL) 100 MG tablet, clonazePAM (KLONOPIN) 0.5 MG tablet  Chronic post-traumatic stress disorder (PTSD) - Plan: clonazePAM (KLONOPIN) 0.5 MG tablet  Panic attacks - Plan: clonazePAM (KLONOPIN) 0.5 MG tablet  I reviewed medication.  Patient overall doing better.  Reassurance given and provided brief psychotherapy she is keeping fast and also going to mosque every night.  She reported involved in religion has been very helpful.  We discussed daughter getting married in May and will be to [place] and then she will need some support system.  I encouraged to keep the appointment with therapist to help her coping skills.  She also noticed son is more involved lately in  her life.  I also encouraged to give more time to medication so it works better.  Recommend to cut down the Klonopin half tablet and take it before she goes to mosque.  Will follow-up in 2 months but she can call and request an earlier appointment if needed.  Follow Up Instructions:     I discussed the assessment and treatment plan with the patient. The patient was provided an opportunity to ask questions and all were answered. The patient agreed with the plan and demonstrated an understanding of the instructions.   The patient was advised to call back or seek an in-person evaluation if the symptoms worsen or if the condition fails to improve as anticipated.    Collaboration of Care: Other provider involved in patient's care AEB notes are available in epic to review  Patient/Guardian was advised Release of Information must be obtained prior to any record release in order to collaborate their care with an outside provider. Patient/Guardian was advised if they have not already done so to contact the registration department to sign all necessary forms in order for [place] to release information regarding their care.   Consent: Patient/Guardian gives verbal consent for treatment and assignment of benefits for services provided during this visit. Patient/Guardian expressed understanding and agreed to proceed.     I provided 27 minutes of non face to face time during this encounter.  Note: This document was prepared by [employer] voice dictation technology and any errors that results from this process are unintentional.    Cleotis Nipper, MD 01/05/2024

## 2024-01-09 ENCOUNTER — Encounter: Payer: Self-pay | Admitting: Nurse Practitioner

## 2024-01-09 ENCOUNTER — Other Ambulatory Visit: Payer: Self-pay | Admitting: Nurse Practitioner

## 2024-01-09 ENCOUNTER — Ambulatory Visit (INDEPENDENT_AMBULATORY_CARE_PROVIDER_SITE_OTHER): Admitting: Nurse Practitioner

## 2024-01-09 VITALS — BP 114/72 | HR 71 | Ht 65.0 in | Wt 127.0 lb

## 2024-01-09 DIAGNOSIS — N952 Postmenopausal atrophic vaginitis: Secondary | ICD-10-CM

## 2024-01-09 DIAGNOSIS — M545 Low back pain, unspecified: Secondary | ICD-10-CM | POA: Diagnosis not present

## 2024-01-09 DIAGNOSIS — R300 Dysuria: Secondary | ICD-10-CM | POA: Diagnosis not present

## 2024-01-09 DIAGNOSIS — R350 Frequency of micturition: Secondary | ICD-10-CM | POA: Diagnosis not present

## 2024-01-09 DIAGNOSIS — N9489 Other specified conditions associated with female genital organs and menstrual cycle: Secondary | ICD-10-CM | POA: Diagnosis not present

## 2024-01-09 DIAGNOSIS — R3129 Other microscopic hematuria: Secondary | ICD-10-CM

## 2024-01-09 LAB — WET PREP FOR TRICH, YEAST, CLUE

## 2024-01-09 MED ORDER — ESTRADIOL 0.1 MG/GM VA CREA: 1.0000 | TOPICAL_CREAM | VAGINAL | 1 refills | Status: DC

## 2024-01-09 NOTE — Progress Notes (Signed)
   Acute Office Visit  Subjective:    Patient ID: Valerie Richards, female    DOB: 1971/02/12, 53 y.o.   MRN: [id number]   HPI 53 y.o. presents today for pain with urination, lower back pain, urinary frequency x 2 weeks. Also reports vaginal dryness and vulvar burning. Prescribed vaginal estrogen last year but stopped using months ago. Did help while using. Daughter present during visit.   Patient's last menstrual period was 02/26/2020 (exact date).    Review of Systems  Constitutional: Negative.   Genitourinary:  Positive for dysuria, flank pain, frequency, urgency and vaginal pain (Itching). Negative for difficulty urinating, hematuria, pelvic pain and vaginal discharge.  Musculoskeletal:  Positive for back pain.       Objective:    Physical Exam Constitutional:      Appearance: Normal appearance.  Abdominal:     Tenderness: There is no right CVA tenderness or left CVA tenderness.  Genitourinary:    General: Normal vulva.     Vagina: Normal.     Cervix: Normal.     Comments: Atrophic changes    BP 114/72 (BP Location: Right Arm, Patient Position: Sitting, Cuff Size: Normal)   Pulse 71   Ht 5\' 5"  (1.651 m)   Wt 127 lb (57.6 kg)   LMP 02/26/2020 (Exact Date)   SpO2 98%   BMI 21.13 kg/m  Wt Readings from Last 3 Encounters:  01/09/24 127 lb (57.6 kg)  12/22/23 127 lb (57.6 kg)  11/09/23 123 lb (55.8 kg)        Patient informed chaperone available to be present for breast and/or pelvic exam. Patient has requested no chaperone to be present. Patient has been advised what will be completed during breast and pelvic exam.   Wet prep negative for pathogens  UA: neg leukocytes, neg nitrites, neg protein, 2+ blood, yellow/clear. Microscopic: wbc 0-5, rbc 10-20, few bacteria  Assessment & Plan:   Problem List Items Addressed This Visit   None Visit Diagnoses       Atrophic vaginitis    -  Primary   Relevant Medications   estradiol (ESTRACE VAGINAL) 0.1 MG/GM vaginal  cream (Start on 01/11/2024)     Vulvar burning       Relevant Orders   WET PREP FOR TRICH, YEAST, CLUE     Burning with urination       Relevant Orders   Urinalysis,Complete w/RFL Culture     Acute midline low back pain without sciatica          Plan: Negative wet prep. UA negative other than some microscopic blood present. Recommend repeating UA in 3-4 weeks. Symptoms likely related to vaginal atrophy. Will restart vaginal estrogen. Use nightly first week, then decrease to twice weekly. Back pain does not appear to be GU related. Follow up with PCP if it continues.   Return if symptoms worsen or fail to improve.    Olivia Mackie DNP, 1:43 PM 01/09/2024

## 2024-01-10 LAB — URINE CULTURE
MICRO NUMBER:: 16273609
Result:: NO GROWTH
SPECIMEN QUALITY:: ADEQUATE

## 2024-01-10 LAB — URINALYSIS, COMPLETE W/RFL CULTURE
Bilirubin Urine: NEGATIVE
Glucose, UA: NEGATIVE
Hyaline Cast: NONE SEEN /LPF
Ketones, ur: NEGATIVE
Leukocyte Esterase: NEGATIVE
Nitrites, Initial: NEGATIVE
Protein, ur: NEGATIVE
Specific Gravity, Urine: 1.02 (ref 1.001–1.035)
pH: 5 (ref 5.0–8.0)

## 2024-01-10 LAB — CULTURE INDICATED

## 2024-01-10 NOTE — Progress Notes (Signed)
 NEUROLOGY FOLLOW UP OFFICE NOTE  Valerie Richards [id number]  Assessment/Plan:   Migraine without aura, without status migrainosus, not intractable, possibly cervicogenic  Left sided trigeminal neuralgia Memory deficits Right sided lumbar radiculopathy Cervicalgia with left sided cervical radiculopathy    Migraine prevention: Restart Ajovy.   For trigeminal neuralgia:  Restart oxcarbazepine 150mg  twice daily.  If no improvement in 4 weeks, will increase to 300mg  twice daily.  Check BMP in 2 months. Migraine rescue:  Will resubmit for approval of Nurtec as she has failed Ubrelvy Tizanidine 4mg  up to 3 times daily as needed for neck pain For workup of memory deficits:  MRI of brain, B12, TSH For further workup of trigeminal neuralgia:  MRI of trigeminal nerves with and without contrast Follow up 6 months.     Subjective:  Valerie Richards is a 53 year old female with history of iron-deficiency anemia who follows up for migraine and cervical radiculopathy   UPDATE:  Neck pain/Left arm pain: Takes tizanidine 4mg  as needed.  Ran out.   Lasted visit, she reported pain down the left arm.  PT ineffective.  NCV-EMG of left upper extremity on 07/27/2023 was normal.  Advised to continue care with Sports Medicine but she stopped.  Migraines: Was taking Ajovy.  Tried samples of Ubrelvy.  Ineffective. Intensity:  moderate to (rarely) severe Duration:  5-6 hours or all day with acetaminophen  Frequency:  usually 1 to 2 days a week.  Since out of Ajovy, frequent again.    Trigeminal neuralgia: Off oxcarbazepine for now. Started experiencing stabbing pain along the left jaw again. It occurs almost daily.  Other: Sometimes notes stabbing pain in her left big toe at night.  Has known chronic low back pain with bilateral radiculopathy.  Failed gabapentin, PT and epidural injections. Sometimes her tongue feels heavy when she speaks Reports short-term memory problems.    Current  medications: Current NSAIDs/analgesics:  paracetamol (acetaminophen) Current antidepressant:  amitriptyline 150mg  at bedtime (prescribed by psychiatry) Current antiepileptic:  lamotrigine 25mg  daily (psychiatry) Current CGRP inhibitor:  Ajovy Muscle relaxant/neck pain:  tizanidine 2-4mg  at bedtime    HISTORY: Migraine: Beginning around September 2021, she developed new onset headaches.  They are severe right frontal pounding headaches associated with nausea, photophobia and sometimes phonophobia.  They sometimes wake her up at night from sleep.  It lasts 2 hours with Tylenol.  They occur every other day.  No known triggers.    Past medication:  topiramate, propranolol (hypotension), duloxetine, Ubrelvy, gabapentin  Dizziness: Around this same time, she started having dizziness.  It is positional.  It is a spinning sensation lasting seconds to a couple of minutes.  Associated with blurred vision and sometimes nausea.  Occurs every other day to twice a day.    Cervical spondylosis with radiculopathy: She has chronic neck pain radiating into the right shoulder.  X-ray cervical spine from 06/17/2020 personally reviewed showed degenerative straightening of the cervical lordosis with focally moderate disc space height loss and osteophytosis of C6-C7.  She then started endorsing aching pain on the left side of her neck and arm, sometimes leg.  Also left eye twitches.  Pain in the left knee and ankle.  Reports numbness and tingling in fingers and toes, particularly when she is feeling anxious. Did not respond to PT.  MRI of C-spine on 07/07/2021  showed cervical spondylosis with with minimal spinal stenosis at C3-4 and mild bilateral neural foraminal narrowing at C5-6.  5 mm lesion within C7  vertebral body noted, nonspecific but may reflect an atypical hemangioma.   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  Right-sided Trigeminal Neuralgia: Beginning in September 2023, she reports paroxsymal shooting pain in the right V2-V3 distribution with right sided facial numbness.  Occurs every other day.     MRI brain with and without contrast on 12/31/2020 showed paranasal sinus disease and 3 mm chronic right cerebellar infarct but no acute abnormality.  Advised to start ASA but stopped because it caused bruising.    PAST MEDICAL HISTORY: Past Medical History:  Diagnosis Date   Acute recurrent ethmoidal sinusitis 09/03/2018   Anemia    iron deficiency   Anxiety    Depression    Fibroid    Menorrhagia 01/20/2021   Migraine    Nasal pain 09/03/2018   Stroke (HCC) 12/2020   See MRI.   Vaginal discharge 01/20/2021   Vaginitis and vulvovaginitis 01/20/2021    MEDICATIONS: Current Outpatient Medications on File Prior to Visit  Medication Sig Dispense Refill   amitriptyline (ELAVIL) 100 MG tablet Take 2 tablets (200 mg total) by mouth at bedtime. 60 tablet 1   Cholecalciferol (VITAMIN D3) 125 MCG (5000 UT) CAPS Take 1 capsule (5,000 Units total) by mouth daily. 90 capsule 3   clonazePAM (KLONOPIN) 0.5 MG tablet Take 1 tablet (0.5 mg total) by mouth 2 (two) times daily. 60 tablet 1   docusate sodium (COLACE) 100 MG capsule Take 1 capsule (100 mg total) by mouth 2 (two) times daily as needed for moderate constipation. (Patient not taking: Reported on 01/09/2024) 60 capsule 5   [START ON 01/11/2024] estradiol (ESTRACE VAGINAL) 0.1 MG/GM vaginal cream Place 1 Applicatorful vaginally 2 (two) times a week. Initial dose: Nightly x 1 week, then twice a week 42.5 g 1   Fremanezumab-vfrm (AJOVY) 225 MG/1.5ML SOAJ Inject 225 mg into the skin every 28 (twenty-eight) days. 1.68 mL 11   LINZESS 290 MCG CAPS capsule Take 1 capsule (290 mcg total) by mouth every morning. 30 capsule 5   ondansetron (ZOFRAN) 4 MG tablet Take 1 tablet (4 mg total) by mouth every 8 (eight) hours as needed for nausea or  vomiting. 20 tablet 0   ondansetron (ZOFRAN-ODT) 4 MG disintegrating tablet 4mg  ODT q4 hours prn nausea/vomit 20 tablet 0   pantoprazole (PROTONIX) 40 MG tablet Take 1 tablet (40 mg total) by mouth daily. 90 tablet 0   Rimegepant Sulfate (NURTEC) 75 MG TBDP Take 1 tablet (75 mg total) by mouth as needed (Take one tablet as needed at the earlist onset of a Migraine.  Max 1 tablet in 24 hours). 16 tablet 5   No current facility-administered medications on file prior to visit.    ALLERGIES: No Known Allergies  FAMILY HISTORY: Family History  Problem Relation Age of Onset   Hypertension Mother    Cancer Father    Diabetes Father    Colon cancer Father    Kidney failure Father    Diabetes Brother       Objective:  Blood pressure 104/66, pulse 65, height 5\' 5"  (1.651 m), weight 125 lb (56.7 kg), last menstrual period 02/26/2020, SpO2 100%. General: No acute distress.  Patient appears well-groomed.   Head:  Normocephalic/atraumatic Neck:  Supple.  No paraspinal tenderness.  Full range of motion. Heart:  Regular rate and rhythm. Low Back:  paraspinal tenderness bilaterally Neuro:  Alert and oriented.  Speech fluent  and not dysarthric.  Language intact.  CN II-XII intact.  Bulk and tone normal.  Muscle strength 5/5 throughout.  Sensation to pinprick reduced in right first toe.  Vibratory sensation intact.  Deep tendon reflexes 2+ throughout, toes downgoing.  Gait normal.  Romberg with sway   Shon Millet, DO  CC: Maryelizabeth Rowan, MD

## 2024-01-15 ENCOUNTER — Encounter: Payer: Self-pay | Admitting: Neurology

## 2024-01-15 ENCOUNTER — Ambulatory Visit (INDEPENDENT_AMBULATORY_CARE_PROVIDER_SITE_OTHER): Payer: Medicare Other | Admitting: Neurology

## 2024-01-15 ENCOUNTER — Other Ambulatory Visit

## 2024-01-15 VITALS — BP 104/66 | HR 65 | Ht 65.0 in | Wt 125.0 lb

## 2024-01-15 DIAGNOSIS — M5416 Radiculopathy, lumbar region: Secondary | ICD-10-CM | POA: Diagnosis not present

## 2024-01-15 DIAGNOSIS — R413 Other amnesia: Secondary | ICD-10-CM

## 2024-01-15 DIAGNOSIS — G500 Trigeminal neuralgia: Secondary | ICD-10-CM | POA: Diagnosis not present

## 2024-01-15 DIAGNOSIS — Z79899 Other long term (current) drug therapy: Secondary | ICD-10-CM | POA: Diagnosis not present

## 2024-01-15 DIAGNOSIS — E559 Vitamin D deficiency, unspecified: Secondary | ICD-10-CM | POA: Diagnosis not present

## 2024-01-15 DIAGNOSIS — G43009 Migraine without aura, not intractable, without status migrainosus: Secondary | ICD-10-CM

## 2024-01-15 LAB — VITAMIN D 25 HYDROXY (VIT D DEFICIENCY, FRACTURES): Vit D, 25-Hydroxy: 44 ng/mL (ref 30–100)

## 2024-01-15 LAB — TSH: TSH: 1.39 m[IU]/L

## 2024-01-15 LAB — VITAMIN B12: Vitamin B-12: 526 pg/mL (ref 200–1100)

## 2024-01-15 MED ORDER — TIZANIDINE HCL 4 MG PO TABS: 4.0000 mg | ORAL_TABLET | Freq: Four times a day (QID) | ORAL | 5 refills | Status: DC | PRN

## 2024-01-15 MED ORDER — AJOVY 225 MG/1.5ML ~~LOC~~ SOAJ: 225.0000 mg | SUBCUTANEOUS | 11 refills | Status: DC

## 2024-01-15 MED ORDER — NURTEC 75 MG PO TBDP: 75.0000 mg | ORAL_TABLET | ORAL | 5 refills | Status: AC | PRN

## 2024-01-15 MED ORDER — OXCARBAZEPINE 150 MG PO TABS: 150.0000 mg | ORAL_TABLET | Freq: Two times a day (BID) | ORAL | 5 refills | Status: DC

## 2024-01-15 NOTE — Patient Instructions (Addendum)
 Restart Ajovy every 28 days For migraines, take Nurtec once daily as needed For facial/jaw pain, start oxcarbazepine 150mg  twice daily.  If no improvement in 4 weeks, contact me and we can increase dose.  Repeat BMP in 2 months. Tizanidine for neck pain Check MRI of brain and trigeminal nerves with and without contrast Check B12 and TSH Follow up 6 months.

## 2024-01-15 NOTE — Addendum Note (Signed)
 Addended by: Leida Lauth on: 01/15/2024 10:25 AM   Modules accepted: Orders

## 2024-01-17 ENCOUNTER — Ambulatory Visit: Payer: Medicare Other | Admitting: Family

## 2024-01-17 ENCOUNTER — Encounter: Payer: Self-pay | Admitting: Family

## 2024-01-17 VITALS — BP 105/63 | HR 61 | Temp 97.5°F | Ht 65.0 in | Wt 124.2 lb

## 2024-01-17 DIAGNOSIS — Z0000 Encounter for general adult medical examination without abnormal findings: Secondary | ICD-10-CM | POA: Diagnosis not present

## 2024-01-17 DIAGNOSIS — Z91011 Allergy to milk products: Secondary | ICD-10-CM | POA: Diagnosis not present

## 2024-01-17 NOTE — Patient Instructions (Addendum)
 Valerie Richards, Thank you for taking time to come for your Medicare Wellness Visit. I appreciate your ongoing commitment to your health goals. Please review the following plan we discussed and let me know if I can assist you in the future.   Please provide proof (print out) of your TdaP or TD and Shingles vaccines from [facility] and drop off when you can.  This is a list of the screening recommended for you and due dates:  Health Maintenance  Topic Date Due   Pneumococcal Vaccination (1 of 2 - PCV) Never done   DTaP/Tdap/Td vaccine (1 - Tdap) Never done   Zoster (Shingles) Vaccine (1 of 2) Never done   Medicare Annual Wellness Visit  01/16/2025   Mammogram  08/17/2025   Pap with HPV screening  12/08/2027   Colon Cancer Screening  01/21/2031   Hepatitis C Screening  Completed   HIV Screening  Completed   HPV Vaccine  Aged Out   Flu Shot  Discontinued   COVID-19 Vaccine  Discontinued

## 2024-01-17 NOTE — Progress Notes (Signed)
 Subjective:    Valerie Richards is a 52 y.o. female who presents for a Welcome to Medicare exam.    Objective:    Today's Vitals   01/17/24 0946  BP: 105/63  Pulse: 61  Temp: (!) 97.5 F (36.4 C)  TempSrc: Temporal  SpO2: 100%  Weight: 124 lb 4 oz (56.4 kg)  Height: 5\' 5"  (1.651 m)  PainSc: 0-No pain  Body mass index is 20.68 kg/m.  Medications Outpatient Encounter Medications as of 01/17/2024  Medication Sig   amitriptyline (ELAVIL) 100 MG tablet Take 2 tablets (200 mg total) by mouth at bedtime.   Cholecalciferol (VITAMIN D3) 125 MCG (5000 UT) CAPS Take 1 capsule (5,000 Units total) by mouth daily.   clonazePAM (KLONOPIN) 0.5 MG tablet Take 1 tablet (0.5 mg total) by mouth 2 (two) times daily.   docusate sodium (COLACE) 100 MG capsule Take 1 capsule (100 mg total) by mouth 2 (two) times daily as needed for moderate constipation.   estradiol (ESTRACE VAGINAL) 0.1 MG/GM vaginal cream Place 1 Applicatorful vaginally 2 (two) times a week. Initial dose: Nightly x 1 week, then twice a week   Fremanezumab-vfrm (AJOVY) 225 MG/1.5ML SOAJ Inject 225 mg into the skin every 28 (twenty-eight) days.   LINZESS 290 MCG CAPS capsule Take 1 capsule (290 mcg total) by mouth every morning.   ondansetron (ZOFRAN) 4 MG tablet Take 1 tablet (4 mg total) by mouth every 8 (eight) hours as needed for nausea or vomiting.   ondansetron (ZOFRAN-ODT) 4 MG disintegrating tablet 4mg  ODT q4 hours prn nausea/vomit   OXcarbazepine (TRILEPTAL) 150 MG tablet Take 1 tablet (150 mg total) by mouth 2 (two) times daily.   pantoprazole (PROTONIX) 40 MG tablet Take 1 tablet (40 mg total) by mouth daily.   Rimegepant Sulfate (NURTEC) 75 MG TBDP Take 1 tablet (75 mg total) by mouth as needed (Take one tablet as needed at the earlist onset of a Migraine.  Max 1 tablet in 24 hours).   tiZANidine (ZANAFLEX) 4 MG tablet Take 1 tablet (4 mg total) by mouth every 6 (six) hours as needed for muscle spasms.   No  facility-administered encounter medications on file as of 01/17/2024.     History: Past Medical History:  Diagnosis Date   Acute recurrent ethmoidal sinusitis 09/03/2018   Anemia    iron deficiency   Anxiety    Depression    Fibroid    Menorrhagia 01/20/2021   Migraine    Nasal pain 09/03/2018   Stroke (HCC) 12/2020   See MRI.   Vaginal discharge 01/20/2021   Vaginitis and vulvovaginitis 01/20/2021   Past Surgical History:  Procedure Laterality Date   EYE SURGERY Bilateral    TONSILLECTOMY      Family History  Problem Relation Age of Onset   Hypertension Mother    Cancer Father    Diabetes Father    Colon cancer Father    Kidney failure Father    Diabetes Brother    Social History   Occupational History   Occupation: Conservation officer, nature    Comment: Surveyor, minerals  Tobacco Use   Smoking status: Former    Current packs/day: 0.00    Average packs/day: 0.3 packs/day for 8.0 years (2.0 ttl pk-yrs)    Types: Cigarettes    Start date: 10/10/2004    Quit date: 10/10/2012    Years since quitting: 11.2   Smokeless tobacco: Never  Vaping Use   Vaping status: Never Used  Substance and Sexual  Activity   Alcohol use: No    Alcohol/week: 0.0 standard drinks of alcohol   Drug use: No   Sexual activity: Not Currently    Partners: Male    Birth control/protection: Post-menopausal   Tobacco Counseling Counseling given: Not Answered  Immunizations and Health Maintenance  There is no immunization history on file for this patient. Health Maintenance Due  Topic Date Due   Pneumococcal Vaccine 77-75 Years old (1 of 2 - PCV) Never done   DTaP/Tdap/Td (1 - Tdap) Never done   Zoster Vaccines- Shingrix (1 of 2) Never done    Activities of Daily Living Patient to perform all ADLs independently at this time.  Physical Exam Vitals and nursing note reviewed.  Constitutional:      Appearance: Normal appearance.  Cardiovascular:     Rate and Rhythm: Normal rate and regular  rhythm.  Pulmonary:     Effort: Pulmonary effort is normal.     Breath sounds: Normal breath sounds.  Musculoskeletal:        General: Normal range of motion.  Skin:    General: Skin is warm and dry.  Neurological:     Mental Status: She is alert.  Psychiatric:        Mood and Affect: Mood normal.        Behavior: Behavior normal.    Advanced Directives: Discussed importance of having medical and mental living will on file. She is interested in getting more information.  Assessment:   This is a routine wellness examination for this patient.    Vision/Hearing screen: 20/50 Left 20/50 Right  20/40 Both  Hearing wnl.  Depression Screen    11/09/2023    9:54 AM 08/10/2023    9:04 AM 05/29/2023   10:07 AM 05/10/2023    9:33 AM  PHQ 2/9 Scores  PHQ - 2 Score 5 5 6 6   PHQ- 9 Score 24 24 27 25      Fall Risk    01/15/2024    9:43 AM  Fall Risk   Falls in the past year? 1  Number falls in past yr: 1  Injury with Fall? 0  Follow up Falls evaluation completed   Cognitive Function: Wnl, no deficits  Patient Care Team: Dulce Sellar, NP as PCP - General (Family Medicine) Carrington Clamp, MD as Consulting Physician (Obstetrics and Gynecology) Drema Dallas, DO as Consulting Physician (Neurology) Olivia Mackie, NP as Nurse Practitioner (Gynecology)     Plan:   I have personally reviewed and noted the following in the patient's chart:   Medical and social history Use of alcohol, tobacco or illicit drugs  Current medications and supplements including opioid prescriptions. Patient is not currently taking opioid prescriptions. Functional ability and status Nutritional status Physical activity Advanced directives List of other physicians Hospitalizations, surgeries, and ER visits in previous 12 months Vitals Screenings to include cognitive, depression, and falls Referrals and appointments  In addition, I have reviewed and discussed with patient certain  preventive protocols, quality metrics, and best practice recommendations. A written personalized care plan for preventive services as well as general preventive health recommendations were provided to patient.    Dulce Sellar, NP 01/17/2024

## 2024-01-19 ENCOUNTER — Other Ambulatory Visit

## 2024-01-19 DIAGNOSIS — R3129 Other microscopic hematuria: Secondary | ICD-10-CM

## 2024-01-23 LAB — URINALYSIS, COMPLETE W/RFL CULTURE
Bacteria, UA: NONE SEEN /HPF
Bilirubin Urine: NEGATIVE
Glucose, UA: NEGATIVE
Hyaline Cast: NONE SEEN /LPF
Ketones, ur: NEGATIVE
Leukocyte Esterase: NEGATIVE
Nitrites, Initial: NEGATIVE
Protein, ur: NEGATIVE
Specific Gravity, Urine: 1.018 (ref 1.001–1.035)
Squamous Epithelial / HPF: NONE SEEN /HPF (ref <=5)
WBC, UA: NONE SEEN /HPF (ref 0–5)
pH: 6.5 (ref 5.0–8.0)

## 2024-01-23 LAB — URINE CULTURE
MICRO NUMBER:: 16321340
Result:: NO GROWTH
SPECIMEN QUALITY:: ADEQUATE

## 2024-01-23 LAB — CULTURE INDICATED

## 2024-01-25 ENCOUNTER — Ambulatory Visit (HOSPITAL_COMMUNITY): Admitting: Licensed Clinical Social Worker

## 2024-02-05 ENCOUNTER — Telehealth: Payer: Self-pay

## 2024-02-05 NOTE — Telephone Encounter (Addendum)
 Letter received temporary supply of Ajovy /Nurtec given to patient. Medication not covered on formulary.    Pa team please start a PA for Ajovy  and nurtec

## 2024-02-06 ENCOUNTER — Telehealth: Payer: Self-pay | Admitting: Pharmacy Technician

## 2024-02-06 ENCOUNTER — Other Ambulatory Visit (HOSPITAL_COMMUNITY): Payer: Self-pay

## 2024-02-06 NOTE — Telephone Encounter (Signed)
 Pharmacy Patient Advocate Encounter   Received notification from Pt Calls Messages that prior authorization for NURTEC 75MG  is required/requested.   Insurance verification completed.   The patient is insured through [facility] .   Per test claim: PA required; PA submitted to above mentioned insurance via CoverMyMeds Key/confirmation #/EOC [id number] Status is pending

## 2024-02-06 NOTE — Telephone Encounter (Signed)
 Pharmacy Patient Advocate Encounter   Received notification from Pt Calls Messages that prior authorization for AJOVY  225MG  is required/requested.   Insurance verification completed.   The patient is insured through [facility] .   Per test claim: PA required; PA submitted to above mentioned insurance via CoverMyMeds Key/confirmation #/EOC [facility] Status is pending

## 2024-02-06 NOTE — Telephone Encounter (Signed)
 PA has been submitted, and telephone encounter has been created for Ajovy  and Nurtec. Please see telephone encounter dated 4.29.25.   Ajovy  will likely be denied due to none of the preferred alternatives being tried.

## 2024-02-07 ENCOUNTER — Ambulatory Visit (HOSPITAL_COMMUNITY): Admitting: Licensed Clinical Social Worker

## 2024-02-09 ENCOUNTER — Other Ambulatory Visit (HOSPITAL_COMMUNITY): Payer: Self-pay

## 2024-02-09 NOTE — Telephone Encounter (Signed)
 Pharmacy Patient Advocate Encounter  Received notification from [employer] that Prior Authorization for NURTEC 75MG  has been DENIED.  Full denial letter will be uploaded to the media tab. See denial reason below.   PA #/Case ID/Reference #: [id number]

## 2024-02-09 NOTE — Telephone Encounter (Signed)
 Pharmacy Patient Advocate Encounter  Received notification from [employer] that Prior Authorization for AJOVY  225MG  has been DENIED.  Full denial letter will be uploaded to the media tab. See denial reason below.   PA #/Case ID/Reference #: [id number]

## 2024-02-12 MED ORDER — AIMOVIG 140 MG/ML ~~LOC~~ SOAJ: 140.0000 mg | SUBCUTANEOUS | 11 refills | Status: DC

## 2024-02-12 NOTE — Telephone Encounter (Signed)
 Patient advised, Please explain to patient that due to her insurance's formulary, she will need to start an alternative monthly injection called Aimovig.  It is in the same family as Ajovy  and I feel is just as effective.  If she is agreeable, please send in order for AIMOVIG 140MG  [place] EVERY 28 DAYS.  REFILLS 5.     Patient agrees to the Aimovig 140 mg

## 2024-02-12 NOTE — Addendum Note (Signed)
 Addended by: Michalene Agee on: 02/12/2024 12:53 PM   Modules accepted: Orders

## 2024-02-13 ENCOUNTER — Other Ambulatory Visit (HOSPITAL_COMMUNITY): Payer: Self-pay

## 2024-02-13 ENCOUNTER — Telehealth: Payer: Self-pay | Admitting: Pharmacist

## 2024-02-13 ENCOUNTER — Telehealth: Payer: Self-pay | Admitting: Pharmacy Technician

## 2024-02-13 NOTE — Telephone Encounter (Signed)
 PA has been submitted, and telephone encounter has been created. Please see telephone encounter dated 5.6.25.  Transition fill is covered by insurance, but PA has been submitted for refills.

## 2024-02-13 NOTE — Telephone Encounter (Signed)
 Appeal has been submitted for Nurtec. Will advise when response is received, please be advised that most companies may take 30 days to make a decision. Appeal letter and supporting information have been faxed to [phone] on 02/13/2024 @3 :16pm.  Thank you, Dene Fines, PharmD Clinical Pharmacist  [place]  Direct Dial: [phone]

## 2024-02-13 NOTE — Telephone Encounter (Signed)
 Information has been sent to clinical pharmacist for appeals review. It may take 5-7 days to prepare the necessary documentation to request the appeal from the insurance.

## 2024-02-13 NOTE — Telephone Encounter (Signed)
 Pharmacy Patient Advocate Encounter  Received notification from [employer] that Prior Authorization for AIMOVIG 140MG  has been APPROVED from 5.6.25 to 12.31.25. Ran test claim, Copay is $0. This test claim was processed through [facility]- copay amounts may vary at other pharmacies due to pharmacy/plan contracts, or as the patient moves through the different stages of their insurance plan.   PA #/Case ID/Reference #: [id number]

## 2024-02-13 NOTE — Telephone Encounter (Signed)
 Pharmacy Patient Advocate Encounter   Received notification from Pt Calls Messages that prior authorization for AIMOVIG 140MG  is required/requested.   Insurance verification completed.   The patient is insured through [facility] .   Per test claim: PA required; PA submitted to above mentioned insurance via CoverMyMeds Key/confirmation #/EOC [id number] Status is pending

## 2024-02-14 ENCOUNTER — Encounter (HOSPITAL_COMMUNITY): Payer: Self-pay | Admitting: Licensed Clinical Social Worker

## 2024-02-14 ENCOUNTER — Ambulatory Visit (INDEPENDENT_AMBULATORY_CARE_PROVIDER_SITE_OTHER): Admitting: Licensed Clinical Social Worker

## 2024-02-14 DIAGNOSIS — F323 Major depressive disorder, single episode, severe with psychotic features: Secondary | ICD-10-CM

## 2024-02-14 DIAGNOSIS — F4310 Post-traumatic stress disorder, unspecified: Secondary | ICD-10-CM
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
## 2024-02-23 ENCOUNTER — Encounter (HOSPITAL_COMMUNITY): Payer: Self-pay

## 2024-02-23 ENCOUNTER — Telehealth: Admitting: Nurse Practitioner

## 2024-02-23 ENCOUNTER — Ambulatory Visit: Payer: Self-pay | Admitting: Family

## 2024-02-23 ENCOUNTER — Telehealth (HOSPITAL_BASED_OUTPATIENT_CLINIC_OR_DEPARTMENT_OTHER): Payer: Self-pay | Admitting: Psychiatry

## 2024-02-23 DIAGNOSIS — Z20818 Contact with and (suspected) exposure to other bacterial communicable diseases: Secondary | ICD-10-CM

## 2024-02-23 DIAGNOSIS — J029 Acute pharyngitis, unspecified: Secondary | ICD-10-CM | POA: Diagnosis not present

## 2024-02-23 DIAGNOSIS — Z91199 Patient's noncompliance with other medical treatment and regimen due to unspecified reason: Secondary | ICD-10-CM

## 2024-02-23 MED ORDER — AMOXICILLIN 500 MG PO CAPS: 500.0000 mg | ORAL_CAPSULE | Freq: Two times a day (BID) | ORAL | 0 refills | Status: AC

## 2024-02-23 NOTE — Telephone Encounter (Signed)
 Chief Complaint: Throat pain x3 days  Symptoms: Body weakness, chills, 9/10 sore throat pain level Pertinent Negatives: Patient denies fever  Disposition: [x] Appointment (Virtual)  Additional Notes: Patient scheduled for a virtual office visit. This RN educated pt on new-worsening symptoms and when to call back/seek emergent care. Pt verbalized understanding and agrees to plan.    Copied from CRM [id number]. Topic: Clinical - Medication Question >> Feb 23, 2024  4:34 PM Alyse July wrote: Reason for CRM: Patient is currently experiencing flu like symptoms and would like to know if a medication can be called into her local pharmacy. Please contact patient to advise [phone] Reason for Disposition  SEVERE (e.g., excruciating) throat pain  Answer Assessment - Initial Assessment Questions ONSET: "When did the throat start hurting?" (Hours or days ago)      Three days ago SEVERITY: "How bad is the sore throat?" (Scale 1-10; mild, moderate or severe)     9/10 STREP EXPOSURE: "Has there been any exposure to strep within the past week?" If Yes, ask: "What type of contact occurred?"      Yes FEVER: "Do you have a fever?" If Yes, ask: "What is your temperature, how was it measured, and when did it start?"     No  Protocols used: Sore Throat-A-AH

## 2024-02-23 NOTE — Progress Notes (Signed)
 Virtual Visit Consent   Valerie Richards, you are scheduled for a virtual visit with a [facility] provider today. Just as with appointments in the office, your consent must be obtained to participate. Your consent will be active for this visit and any virtual visit you may have with one of our providers in the next 365 days. If you have a MyChart account, a copy of this consent can be sent to you electronically.  As this is a virtual visit, video technology does not allow for your provider to perform a traditional examination. This may limit your provider's ability to fully assess your condition. If your provider identifies any concerns that need to be evaluated in person or the need to arrange testing (such as labs, EKG, etc.), we will make arrangements to do so. Although advances in technology are sophisticated, we cannot ensure that it will always work on either your end or our end. If the connection with a video visit is poor, the visit may have to be switched to a telephone visit. With either a video or telephone visit, we are not always able to ensure that we have a secure connection.  By engaging in this virtual visit, you consent to the provision of healthcare and authorize for your insurance to be billed (if applicable) for the services provided during this visit. Depending on your insurance coverage, you may receive a charge related to this service.  I need to obtain your verbal consent now. Are you willing to proceed with your visit today? Valerie Richards has provided verbal consent on 02/23/2024 for a virtual visit (video or telephone). Mardene Shake, FNP  Date: 02/23/2024 5:41 PM   Virtual Visit via Video Note   I, Mardene Shake, connected with  Valerie Richards  ([id number], 20-Apr-1971) on 02/23/24 at  5:45 PM EDT by a video-enabled telemedicine application and verified that I am speaking with the correct person using two identifiers.  Location: Patient: Virtual Visit Location Patient:  Home Provider: Virtual Visit Location Provider: Home Office   I discussed the limitations of evaluation and management by telemedicine and the availability of in person appointments. The patient expressed understanding and agreed to proceed.    History of Present Illness: Valerie Richards is a 53 y.o. who identifies as a female who was assigned female at birth, and is being seen today for a sore throat  Her son has strep throat currently   She rates her pain 9/10 and has been ongoing for 3 days   Denies associated symptoms   Problems:  Patient Active Problem List   Diagnosis Date Noted   Polyarthralgia 05/29/2023   Chronic fatigue 05/11/2023   Vitamin D  deficiency 05/10/2023   Gastroesophageal reflux disease with esophagitis without hemorrhage 05/10/2023   Chronic constipation 11/14/2022   Severe episode of recurrent major depressive disorder (HCC) 09/26/2019   Nasal congestion 09/03/2018   Nonintractable headache 09/03/2018   Arthralgia of both hands 02/28/2018   Uterine leiomyoma 11/30/2017   Symptomatic anemia 08/02/2016    Allergies: No Known Allergies Medications:  Current Outpatient Medications:    amitriptyline  (ELAVIL ) 100 MG tablet, Take 2 tablets (200 mg total) by mouth at bedtime., Disp: 60 tablet, Rfl: 1   Cholecalciferol (VITAMIN D3) 125 MCG (5000 UT) CAPS, Take 1 capsule (5,000 Units total) by mouth daily., Disp: 90 capsule, Rfl: 3   clonazePAM  (KLONOPIN ) 0.5 MG tablet, Take 1 tablet (0.5 mg total) by mouth 2 (two) times daily., Disp: 60 tablet, Rfl: 1  docusate sodium  (COLACE) 100 MG capsule, Take 1 capsule (100 mg total) by mouth 2 (two) times daily as needed for moderate constipation., Disp: 60 capsule, Rfl: 5   Erenumab -aooe (AIMOVIG ) 140 MG/ML SOAJ, Inject 140 mg into the skin every 28 (twenty-eight) days., Disp: 1.12 mL, Rfl: 11   estradiol  (ESTRACE  VAGINAL) 0.1 MG/GM vaginal cream, Place 1 Applicatorful vaginally 2 (two) times a week. Initial dose: Nightly x 1  week, then twice a week, Disp: 42.5 g, Rfl: 1   LINZESS  290 MCG CAPS capsule, Take 1 capsule (290 mcg total) by mouth every morning., Disp: 30 capsule, Rfl: 5   ondansetron  (ZOFRAN ) 4 MG tablet, Take 1 tablet (4 mg total) by mouth every 8 (eight) hours as needed for nausea or vomiting., Disp: 20 tablet, Rfl: 0   ondansetron  (ZOFRAN -ODT) 4 MG disintegrating tablet, 4mg  ODT q4 hours prn nausea/vomit, Disp: 20 tablet, Rfl: 0   OXcarbazepine  (TRILEPTAL ) 150 MG tablet, Take 1 tablet (150 mg total) by mouth 2 (two) times daily., Disp: 60 tablet, Rfl: 5   pantoprazole  (PROTONIX ) 40 MG tablet, Take 1 tablet (40 mg total) by mouth daily., Disp: 90 tablet, Rfl: 0   Rimegepant Sulfate (NURTEC) 75 MG TBDP, Take 1 tablet (75 mg total) by mouth as needed (Take one tablet as needed at the earlist onset of a Migraine.  Max 1 tablet in 24 hours)., Disp: 16 tablet, Rfl: 5   tiZANidine  (ZANAFLEX ) 4 MG tablet, Take 1 tablet (4 mg total) by mouth every 6 (six) hours as needed for muscle spasms., Disp: 30 tablet, Rfl: 5  Observations/Objective: Patient is well-developed, well-nourished in no acute distress.  Resting comfortably  at home.  Head is normocephalic, atraumatic.  No labored breathing.  Speech is clear and coherent with logical content.  Patient is alert and oriented at baseline.    Assessment and Plan:   1. Strep throat exposure   2. Pharyngitis, unspecified etiology   Meds ordered this encounter  Medications   amoxicillin  (AMOXIL ) 500 MG capsule    Sig: Take 1 capsule (500 mg total) by mouth 2 (two) times daily for 10 days.    Dispense:  20 capsule    Refill:  0      Follow Up Instructions: I discussed the assessment and treatment plan with the patient. The patient was provided an opportunity to ask questions and all were answered. The patient agreed with the plan and demonstrated an understanding of the instructions.  A copy of instructions were sent to the patient via MyChart unless  otherwise noted below.    The patient was advised to call back or seek an in-person evaluation if the symptoms worsen or if the condition fails to improve as anticipated.    Mardene Shake, FNP

## 2024-02-23 NOTE — Progress Notes (Signed)
 Patient is no-show on video platform.  I called on her phone but no response.

## 2024-02-24 ENCOUNTER — Other Ambulatory Visit

## 2024-02-26 NOTE — Telephone Encounter (Signed)
 Noted

## 2024-02-28 ENCOUNTER — Encounter (HOSPITAL_COMMUNITY): Payer: Self-pay | Admitting: Licensed Clinical Social Worker

## 2024-02-28 ENCOUNTER — Other Ambulatory Visit (HOSPITAL_COMMUNITY): Payer: Self-pay

## 2024-02-28 ENCOUNTER — Ambulatory Visit (INDEPENDENT_AMBULATORY_CARE_PROVIDER_SITE_OTHER): Admitting: Licensed Clinical Social Worker

## 2024-02-28 DIAGNOSIS — F323 Major depressive disorder, single episode, severe with psychotic features: Secondary | ICD-10-CM | POA: Diagnosis not present

## 2024-02-28 NOTE — Telephone Encounter (Signed)
 The appeal for Nurtec has been approved by the insurance through 10/09/2024.  A 30 day supply is $0.00 copay.  Thank you, Dene Fines, PharmD Clinical Pharmacist  [facility]  Direct Dial: [phone]
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
## 2024-03-04 ENCOUNTER — Other Ambulatory Visit: Payer: Self-pay | Admitting: Nurse Practitioner

## 2024-03-04 DIAGNOSIS — N952 Postmenopausal atrophic vaginitis: Secondary | ICD-10-CM

## 2024-03-05 NOTE — Telephone Encounter (Signed)
 Med refill request: Estradiol  cream  Last AEX: last OV 01/09/24 Next AEX:not scheduled  Last MMG (if hormonal med) 08/18/23 birads cat 1 benign  Refill authorized: last rx 42.5g with 1 refill. Please approve or deny

## 2024-03-14 ENCOUNTER — Ambulatory Visit (HOSPITAL_COMMUNITY): Admitting: Licensed Clinical Social Worker

## 2024-03-14 ENCOUNTER — Encounter (HOSPITAL_COMMUNITY): Payer: Self-pay | Admitting: Licensed Clinical Social Worker

## 2024-03-14 ENCOUNTER — Ambulatory Visit: Admitting: Allergy

## 2024-03-14 DIAGNOSIS — F4310 Post-traumatic stress disorder, unspecified: Secondary | ICD-10-CM

## 2024-03-14 DIAGNOSIS — F323 Major depressive disorder, single episode, severe with psychotic features: Secondary | ICD-10-CM | POA: Diagnosis not present
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
## 2024-03-18 ENCOUNTER — Ambulatory Visit: Admitting: Physician Assistant

## 2024-03-21 ENCOUNTER — Ambulatory Visit: Payer: Self-pay

## 2024-03-21 DIAGNOSIS — R2231 Localized swelling, mass and lump, right upper limb: Secondary | ICD-10-CM | POA: Diagnosis not present

## 2024-03-21 NOTE — Telephone Encounter (Signed)
 FYI Only or Action Required?: FYI only for provider  Patient was last seen in primary care on 01/17/2024 by Versa Gore, NP. Called Nurse Triage reporting Hand Pain. Symptoms began today. Interventions attempted: Nothing. Symptoms are: gradually worsening.  Triage Disposition: Go to ED or PCP/Alternative with Approval  Patient/caregiver understands and will follow disposition?: Yes    Copied from CRM [id number]. Topic: Clinical - Red Word Triage >> Mar 21, 2024  9:43 AM Kita Perish H wrote: [facility] that prompted transfer to Nurse Triage: Something bit her on finger while walking not sure what it was didn't see, now has pain in finger that goes all over hand and feels nauseous Reason for Disposition  Patient sounds very sick or weak to the triager  Answer Assessment - Initial Assessment Questions 1. ONSET: When did the pain start?      This am, thinks was bit by something 2. LOCATION and RADIATION: Where is the pain located?  (e.g., fingertip, around nail, joint, entire  finger)      Right hand pinky finger 3. SEVERITY: How bad is the pain? What does it keep you from doing?   (Scale 1-10; or mild, moderate, severe)  - MILD (1-3): doesn't interfere with normal activities.   - MODERATE (4-7): interferes with normal activities or awakens from sleep.  - SEVERE (8-10): excruciating pain, unable to hold a glass of water or bend finger even a little.     moderate 4. APPEARANCE: What does the finger look like? (e.g., redness, swelling, bruising, pallor)     swelling 5. WORK OR EXERCISE: Has there been any recent work or exercise that involved this part (i.e., fingers or hand) of the body?     Thinks bit by something 6. CAUSE: What do you think is causing the pain?     no 7. AGGRAVATING FACTORS: What makes the pain worse? (e.g., using computer)     Movement  8. OTHER SYMPTOMS: Do you have any other symptoms? (e.g., fever, neck pain, numbness)     Nauseous, states breathing  is difficult, now wheezing 9. PREGNANCY: Is there any chance you are pregnant? When was your last menstrual period?     na  Protocols used: Finger Pain-A-AH

## 2024-03-21 NOTE — Telephone Encounter (Signed)
 FYI- Pt advised to go to [facility]

## 2024-03-22 NOTE — Telephone Encounter (Signed)
 Noted

## 2024-03-25 ENCOUNTER — Encounter (HOSPITAL_COMMUNITY): Payer: Self-pay | Admitting: Psychiatry

## 2024-03-25 ENCOUNTER — Telehealth (HOSPITAL_BASED_OUTPATIENT_CLINIC_OR_DEPARTMENT_OTHER): Admitting: Psychiatry

## 2024-03-25 DIAGNOSIS — F4312 Post-traumatic stress disorder, chronic: Secondary | ICD-10-CM

## 2024-03-25 DIAGNOSIS — F323 Major depressive disorder, single episode, severe with psychotic features: Secondary | ICD-10-CM | POA: Diagnosis not present

## 2024-03-25 DIAGNOSIS — F410 Panic disorder [episodic paroxysmal anxiety] without agoraphobia: Secondary | ICD-10-CM | POA: Diagnosis not present

## 2024-03-25 MED ORDER — CLONAZEPAM 0.5 MG PO TABS: 0.5000 mg | ORAL_TABLET | Freq: Every morning | ORAL | 2 refills | Status: DC

## 2024-03-25 MED ORDER — AMITRIPTYLINE HCL 100 MG PO TABS
200.0000 mg | ORAL_TABLET | Freq: Every day | ORAL | 2 refills | Status: DC
Start: 2024-03-25 — End: 2024-06-24

## 2024-03-25 NOTE — Progress Notes (Signed)
 [facility] MD Virtual Progress Note   Patient Location: home Provider Location: Home Office  I connect with patient by video and verified that I am speaking with correct person by using two identifiers. I discussed the limitations of evaluation and management by telemedicine and the availability of in person appointments. I also discussed with the patient that there may be a patient responsible charge related to this service. The patient expressed understanding and agreed to proceed.  Valerie Richards [id number] 53 y.o.  03/25/2024 1:15 PM  History of Present Illness:  Patient is evaluated by video session.  She reported things are little bit better as started going outside but does not go every day.  She tried to involved in volunteer work at [employer] and she had actually enjoyed but again not persistent.  There are days she went outside but came back home when she sought too many people around her.  She has few panic attacks but denies any crying spells or any feeling of hopelessness or worthlessness.  She had a plan to visit to see her mother but did not brought the ticket.  She is concerned about ongoing war in the region.  When she takes the medicine she sleep better.  She notices when does not take the medicine she gets panic attack, anxiety attack, nightmares and could not sleep well.  She tried to keep the appointment with Camilo Cella.  She denies any suicidal thoughts or homicidal thoughts.  Her daughter now married and moved to [place].  She has another daughter who live close by and sometimes she goes to visit her and they have a pool in their neighborhood.  She tried to spend time with her grandchild.  Her appetite is okay.  Her weight is stable.  She is taking amitriptyline  200 mg at bedtime and Klonopin  0.5 mg only in the morning as second pill make her very groggy and sleepy.  She admitted since taking the medication regularly her symptoms are not as bad.  She denies any  hallucination or any agitation.  Past Psychiatric History: H/O depression, anxiety, childhood trauma and panic attack.  Saw provider few years ago at [facility] and prescribed Lexapro, Prozac, Effexor, mirtazapine  and trazodone  but did not felt it helped.  Prescribed Zoloft , Ambien and hydroxyzine by PCP.  We tried Cymbalta  and Abilify . She was recommended inpatient years ago but did not stay in the hospital.    Outpatient Encounter Medications as of 03/25/2024  Medication Sig   amitriptyline  (ELAVIL ) 100 MG tablet Take 2 tablets (200 mg total) by mouth at bedtime.   Cholecalciferol (VITAMIN D3) 125 MCG (5000 UT) CAPS Take 1 capsule (5,000 Units total) by mouth daily.   clonazePAM  (KLONOPIN ) 0.5 MG tablet Take 1 tablet (0.5 mg total) by mouth 2 (two) times daily.   docusate sodium  (COLACE) 100 MG capsule Take 1 capsule (100 mg total) by mouth 2 (two) times daily as needed for moderate constipation.   Erenumab -aooe (AIMOVIG ) 140 MG/ML SOAJ Inject 140 mg into the skin every 28 (twenty-eight) days.   estradiol  (ESTRACE ) 0.1 MG/GM vaginal cream Place 1 g vaginally 2 (two) times a week.   LINZESS  290 MCG CAPS capsule Take 1 capsule (290 mcg total) by mouth every morning.   ondansetron  (ZOFRAN ) 4 MG tablet Take 1 tablet (4 mg total) by mouth every 8 (eight) hours as needed for nausea or vomiting.   ondansetron  (ZOFRAN -ODT) 4 MG disintegrating tablet 4mg  ODT q4 hours prn nausea/vomit   OXcarbazepine  (  TRILEPTAL ) 150 MG tablet Take 1 tablet (150 mg total) by mouth 2 (two) times daily.   pantoprazole  (PROTONIX ) 40 MG tablet Take 1 tablet (40 mg total) by mouth daily.   Rimegepant Sulfate (NURTEC) 75 MG TBDP Take 1 tablet (75 mg total) by mouth as needed (Take one tablet as needed at the earlist onset of a Migraine.  Max 1 tablet in 24 hours).   tiZANidine  (ZANAFLEX ) 4 MG tablet Take 1 tablet (4 mg total) by mouth every 6 (six) hours as needed for muscle spasms.   No facility-administered encounter  medications on file as of 03/25/2024.    Recent Results (from the past 2160 hours)  Urinalysis,Complete w/RFL Culture     Status: Abnormal   Collection Time: 01/09/24 11:31 AM  Result Value Ref Range   Color, Urine YELLOW YELLOW   APPearance CLEAR CLEAR   Specific Gravity, Urine 1.020 1.001 - 1.035   pH 5.0 5.0 - 8.0   Glucose, UA NEGATIVE NEGATIVE   Bilirubin Urine NEGATIVE NEGATIVE   Ketones, ur NEGATIVE NEGATIVE   Hgb urine dipstick 2+ (A) NEGATIVE   Protein, ur NEGATIVE NEGATIVE   Nitrites, Initial NEGATIVE NEGATIVE   Leukocyte Esterase NEGATIVE NEGATIVE   WBC, UA 0-5 0 - 5 /HPF   RBC / HPF 10-20 (A) 0 - 2 /HPF   Squamous Epithelial / HPF 6-10 (A) < OR = 5 /HPF   Bacteria, UA FEW (A) NONE SEEN /HPF   Hyaline Cast NONE SEEN NONE SEEN /LPF   Urine-Other      Comment: MODERATE MUCUS PRESENT URINE CULTURE PENDING    Note      Comment: This urine was analyzed for the presence of WBC,  RBC, bacteria, casts, and other formed elements.  Only those elements seen were reported. . .   Urine Culture     Status: None   Collection Time: 01/09/24 11:31 AM  Result Value Ref Range   MICRO NUMBER: [id number]    SPECIMEN QUALITY: Adequate    Sample Source URINE    STATUS: FINAL    Result: No Growth   REFLEXIVE URINE CULTURE     Status: None   Collection Time: 01/09/24 11:31 AM  Result Value Ref Range   REFLEXIVE URINE CULTURE      Comment: CULTURE INDICATED - RESULTS TO FOLLOW  WET PREP FOR TRICH, YEAST, CLUE     Status: None   Collection Time: 01/09/24 11:52 AM   Specimen: Genital  Result Value Ref Range   Source: VAGINA    RESULT      Comment: EPITHELIAL CELLS-PRESENT CLUE CELLS-NONE SEEN YEAST-NONE SEEN TRICHOMONAS-NONE SEEN WBC-FEW BACTERIA-MODERATE EPITH. CELLS <6 PER HPF   TSH     Status: None   Collection Time: 01/15/24 10:32 AM  Result Value Ref Range   TSH 1.39 mIU/L    Comment:           Reference Range .           > or = 20 Years  0.40-4.50 .                 Pregnancy Ranges           First trimester    0.26-2.66           Second trimester   0.55-2.73           Third trimester    0.43-2.91   Vitamin B12     Status: None   Collection Time: 01/15/24  10:32 AM  Result Value Ref Range   Vitamin B-12 526 200 - 1,100 pg/mL  VITAMIN D  25 Hydroxy (Vit-D Deficiency, Fractures)     Status: None   Collection Time: 01/15/24 10:32 AM  Result Value Ref Range   Vit D, 25-Hydroxy 44 30 - 100 ng/mL    Comment: Vitamin D  Status         25-OH Vitamin D : . Deficiency:                    <20 ng/mL Insufficiency:             20 - 29 ng/mL Optimal:                 > or = 30 ng/mL . For 25-OH Vitamin D  testing on patients on  D2-supplementation and patients for whom quantitation  of D2 and D3 fractions is required, the QuestAssureD(TM) 25-OH VIT D, (D2,D3), LC/MS/MS is recommended: order  code 95284 (patients >49yrs). . See Note 1 . Note 1 . For additional information, please refer to  http://education.Quest[facility].com/faq/FAQ199  (This link is being provided for informational/ educational purposes only.)   Urinalysis,Complete w/RFL Culture     Status: Abnormal   Collection Time: 01/19/24  9:29 AM  Result Value Ref Range   Color, Urine YELLOW YELLOW   APPearance CLEAR CLEAR   Specific Gravity, Urine 1.018 1.001 - 1.035   pH 6.5 5.0 - 8.0   Glucose, UA NEGATIVE NEGATIVE   Bilirubin Urine NEGATIVE NEGATIVE   Ketones, ur NEGATIVE NEGATIVE   Hgb urine dipstick 1+ (A) NEGATIVE   Protein, ur NEGATIVE NEGATIVE   Nitrites, Initial NEGATIVE NEGATIVE   Leukocyte Esterase NEGATIVE NEGATIVE   WBC, UA NONE SEEN 0 - 5 /HPF   RBC / HPF 3-10 (A) 0 - 2 /HPF   Squamous Epithelial / HPF NONE SEEN < OR = 5 /HPF   Bacteria, UA NONE SEEN NONE SEEN /HPF   Hyaline Cast NONE SEEN NONE SEEN /LPF   Note      Comment: This urine was analyzed for the presence of WBC,  RBC, bacteria, casts, and other formed elements.  Only those elements seen were  reported. . .   Urine Culture     Status: None   Collection Time: 01/19/24  9:29 AM  Result Value Ref Range   MICRO NUMBER: [id number]    SPECIMEN QUALITY: Adequate    Sample Source URINE    STATUS: FINAL    Result: No Growth   REFLEXIVE URINE CULTURE     Status: None   Collection Time: 01/19/24  9:29 AM  Result Value Ref Range   REFLEXIVE URINE CULTURE      Comment: CULTURE INDICATED - RESULTS TO FOLLOW     Psychiatric Specialty Exam: Physical Exam  Review of Systems  Weight 124 lb (56.2 kg), last menstrual period 02/26/2020.There is no height or weight on file to calculate BMI.  General Appearance: Casual  Eye Contact:  Fair  Speech:  Normal Rate  Volume:  Normal  Mood:  Dysphoric  Affect:  Appropriate  Thought Process:  Goal Directed  Orientation:  Full (Time, Place, and Person)  Thought Content:  Rumination  Suicidal Thoughts:  No  Homicidal Thoughts:  No  Memory:  Immediate;   Fair Recent;   Fair Remote;   Fair  Judgement:  Fair  Insight:  Present  Psychomotor Activity:  Normal  Concentration:  Concentration: Fair and Attention Span: Fair  Recall:  Good  Fund of Knowledge:  Good  Language:  Fair  Akathisia:  No  Handed:  Right  AIMS (if indicated):     Assets:  Communication Skills Desire for Improvement Housing  ADL's:  Intact  Cognition:  WNL  Sleep:  ok       11/09/2023    9:54 AM 08/10/2023    9:04 AM 05/29/2023   10:07 AM 05/10/2023    9:33 AM 03/09/2023   10:48 AM  Depression screen PHQ 2/9  Decreased Interest 2 2 3 3 2   Down, Depressed, Hopeless 3 3 3 3 3   PHQ - 2 Score 5 5 6 6 5   Altered sleeping 3 3 3 3 3   Tired, decreased energy 3 3 3 3 3   Change in appetite 2 2 3 2 2   Feeling bad or failure about yourself  3 3 3 3 3   Trouble concentrating 3 3 3 3 3   Moving slowly or fidgety/restless 3 3 3 2 2   Suicidal thoughts 2 2 3 3 2   PHQ-9 Score 24 24 27 25 23   Difficult doing work/chores Extremely dIfficult Extremely dIfficult  Extremely  dIfficult Extremely dIfficult    Assessment/Plan: Severe major depression with psychotic features (HCC) - Plan: clonazePAM  (KLONOPIN ) 0.5 MG tablet, amitriptyline  (ELAVIL ) 100 MG tablet  Chronic post-traumatic stress disorder (PTSD) - Plan: clonazePAM  (KLONOPIN ) 0.5 MG tablet  Panic attacks - Plan: clonazePAM  (KLONOPIN ) 0.5 MG tablet  Since patient taking the medication regularly doing somewhat better but is still some base feeling nervous more than usual.  Strongly encouraged to leave the house and try to walk every day.  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  So far no major concern other than Klonopin  making her sleepy if she takes the second pill.  Recommend to cut down the Klonopin  only take in the morning and keep the amitriptyline  200 mg at bedtime.  Encourage therapy appointment with Camilo Cella.  Recommend to call us  back if she has any question or any concern.  Will follow-up in 3 months.  Patient has another daughter and son who live close by and sometimes she does talk to them and visit them.   Follow Up Instructions:     I discussed the assessment and treatment plan with the patient. The patient was provided an opportunity to ask questions and all were answered. The patient agreed with the plan and demonstrated an understanding of the instructions.   The patient was advised to call back or seek an in-person evaluation if the symptoms worsen or if the condition fails to improve as anticipated.    Collaboration of Care: Other provider involved in patient's care AEB notes are available in epic to review  Patient/Guardian was advised Release of Information must be obtained prior to any record release in order to collaborate their care with an outside provider. Patient/Guardian was advised if they have not already done so to contact the registration department to sign all necessary forms in order for us  to  release information regarding their care.   Consent: Patient/Guardian gives verbal consent for treatment and assignment of benefits for services provided during this visit. Patient/Guardian expressed understanding and agreed to proceed.     Total encounter time 22 minutes which includes face-to-face time, chart reviewed, care coordination, order entry and documentation during this encounter.   Note: This document was prepared by [employer] voice dictation technology and any errors that results from this  process are unintentional.    Arturo Late, MD 03/25/2024

## 2024-03-27 ENCOUNTER — Ambulatory Visit (HOSPITAL_COMMUNITY): Admitting: Licensed Clinical Social Worker

## 2024-05-01 ENCOUNTER — Ambulatory Visit (HOSPITAL_COMMUNITY): Admitting: Licensed Clinical Social Worker

## 2024-05-28 ENCOUNTER — Ambulatory Visit: Payer: Self-pay | Admitting: Cardiology

## 2024-05-28 ENCOUNTER — Ambulatory Visit (HOSPITAL_COMMUNITY)
Admission: RE | Admit: 2024-05-28 | Discharge: 2024-05-28 | Disposition: A | Discharge status: Home / Self Care | Source: Ambulatory Visit | Attending: Internal Medicine | Admitting: Internal Medicine

## 2024-05-28 DIAGNOSIS — I059 Rheumatic mitral valve disease, unspecified: Secondary | ICD-10-CM | POA: Insufficient documentation

## 2024-05-28 DIAGNOSIS — I340 Nonrheumatic mitral (valve) insufficiency: Secondary | ICD-10-CM

## 2024-05-28 LAB — ECHOCARDIOGRAM COMPLETE
AR max vel: 3.03 cm2
AV Area VTI: 2.89 cm2
AV Area mean vel: 2.75 cm2
AV Mean grad: 3 mmHg
AV Peak grad: 5.4 mmHg
Ao pk vel: 1.16 m/s
S' Lateral: 2.7 cm

## 2024-05-30 ENCOUNTER — Ambulatory Visit (HOSPITAL_COMMUNITY)

## 2024-06-04 ENCOUNTER — Other Ambulatory Visit: Payer: Self-pay | Admitting: *Deleted

## 2024-06-04 ENCOUNTER — Other Ambulatory Visit: Payer: Self-pay | Admitting: Nurse Practitioner

## 2024-06-04 DIAGNOSIS — N952 Postmenopausal atrophic vaginitis: Secondary | ICD-10-CM

## 2024-06-04 DIAGNOSIS — I059 Rheumatic mitral valve disease, unspecified: Secondary | ICD-10-CM

## 2024-06-04 NOTE — Telephone Encounter (Signed)
 Letter of results sent to pt

## 2024-06-04 NOTE — Telephone Encounter (Signed)
  Med refill request:  Estradiol  (ESTRACE ) 0.1 MG/GM vaginal cream Started: 03/07/24 - Disp: 42.5 g with 0 refills  Last AEX: 12/08/22 Next AEX: 06/12/24 Last MMG (if hormonal med): 08/18/23  Refill authorized? Please Advise.

## 2024-06-04 NOTE — Progress Notes (Signed)
 echo

## 2024-06-12 ENCOUNTER — Encounter: Payer: Self-pay | Admitting: Nurse Practitioner

## 2024-06-12 ENCOUNTER — Ambulatory Visit: Admitting: Nurse Practitioner

## 2024-06-12 ENCOUNTER — Ambulatory Visit (INDEPENDENT_AMBULATORY_CARE_PROVIDER_SITE_OTHER): Admitting: Nurse Practitioner

## 2024-06-12 ENCOUNTER — Encounter (HOSPITAL_COMMUNITY): Payer: Self-pay | Admitting: Licensed Clinical Social Worker

## 2024-06-12 ENCOUNTER — Ambulatory Visit (INDEPENDENT_AMBULATORY_CARE_PROVIDER_SITE_OTHER): Admitting: Licensed Clinical Social Worker

## 2024-06-12 VITALS — BP 102/70 | HR 72 | Ht 65.0 in | Wt 125.0 lb

## 2024-06-12 DIAGNOSIS — Z01419 Encounter for gynecological examination (general) (routine) without abnormal findings: Secondary | ICD-10-CM | POA: Diagnosis not present

## 2024-06-12 DIAGNOSIS — F331 Major depressive disorder, recurrent, moderate: Secondary | ICD-10-CM

## 2024-06-12 DIAGNOSIS — Z780 Asymptomatic menopausal state: Secondary | ICD-10-CM

## 2024-06-12 DIAGNOSIS — R300 Dysuria: Secondary | ICD-10-CM | POA: Diagnosis not present

## 2024-06-12 DIAGNOSIS — N952 Postmenopausal atrophic vaginitis: Secondary | ICD-10-CM

## 2024-06-12 DIAGNOSIS — R3129 Other microscopic hematuria: Secondary | ICD-10-CM

## 2024-06-12 DIAGNOSIS — F332 Major depressive disorder, recurrent severe without psychotic features: Secondary | ICD-10-CM

## 2024-06-12 DIAGNOSIS — Z1331 Encounter for screening for depression: Secondary | ICD-10-CM

## 2024-06-12 MED ORDER — ESTRADIOL 0.1 MG/GM VA CREA: 1.0000 g | TOPICAL_CREAM | VAGINAL | 2 refills | Status: DC

## 2024-06-12 NOTE — Progress Notes (Signed)
 Virtual Visit via Video Note  I connected with Kenitha A Whitmyer on 06/12/24 at  9:00 AM EDT by a video enabled telemedicine application and verified that I am speaking with the correct person using two identifiers.  Location: Patient: home Provider: home office   I discussed the limitations of evaluation and management by telemedicine and the availability of in person appointments. The patient expressed understanding and agreed to proceed.   I discussed the assessment and treatment plan with the patient. The patient was provided an opportunity to ask questions and all were answered. The patient agreed with the plan and demonstrated an understanding of the instructions.   The patient was advised to call back or seek an in-person evaluation if the symptoms worsen or if the condition fails to improve as anticipated.  I provided 55 minutes of non-face-to-face time during this encounter.   Harlene JONELLE Rosser, LCSW   THERAPIST PROGRESS NOTE  Session Time: 9:00am-9:55am  Participation Level: Active  Behavioral Response: CasualAlertDepressed  Type of Therapy: Individual Therapy  Treatment Goals addressed:  Treatment Goals addressed: Treatment Goals addressed: LTG: Recall traumatic events without becoming overwhelmed with negative emotions (BH CCP Acute or Chronic Trauma Reaction) Disciplines:  Interdisciplinary, PROVIDER Expected end:  06/20/24 STG: Tenicia Gural will verbalize an increased sense of mastery over PTSD symptoms by using several techniques to cope with flashbacks, decrease the power of triggers, and decrease negative thinking (BH CCP Acute or Chronic Trauma Reaction) Disciplines:  Interdisciplinary, PROVIDER Expected end:  06/20/24  ProgressTowards Goals: Progressing  Interventions: CBT  Summary: EVYN KOOYMAN is a 53 y.o. female who presents with MDD, moderate.   Suicidal/Homicidal: Nowithout intent/plan  Therapist Response: Shaakira engaged well in individual virtual  session with clinician. Clinician utilized CBT to process thoughts, feelings, and interactions. Clinician explored interactions during her trip to [place] to visit her family. Clinician identified mixed emotions and a lot of anxiety being there. Nikky shared that she went to the hospital 3 times while she was there due to her high anxiety and stress. Clinician processed current mood and noted that anxiety and depression are present daily, she is very lonely, and she is isolated. Clinician identified the need for socialization and engagement. Clinician explored options for joining the [facility]. Kendy shared that she has joined and has gone once, but it was not a great experience. Clinician looked up group fitness classes and was able to recommend cardio and dance classes that are fun and that will help her have an opportunity to spend time with other women. The more we discussed this, the more excited Mishaal became and she plans to go to a Zumba class this evening.   Plan: Return again in 2-3 weeks.  Diagnosis: MDD (major depressive disorder), recurrent episode, moderate (HCC)  Collaboration of Care: Psychiatrist AEB reviewed last note from Dr. Curry  Patient/Guardian was advised Release of Information must be obtained prior to any record release in order to collaborate their care with an outside provider. Patient/Guardian was advised if they have not already done so to contact the registration department to sign all necessary forms in order for us  to release information regarding their care.   Consent: Patient/Guardian gives verbal consent for treatment and assignment of benefits for services provided during this visit. Patient/Guardian expressed understanding and agreed to proceed.   Harlene JONELLE [place], LCSW 06/12/2024

## 2024-06-12 NOTE — Progress Notes (Signed)
 Manette A Doucette 1971/05/29 [id number]   History:  53 y.o. [id number] presents for annual exam. Complains of burning with urination. This is not new but feels worse. Postmenopausal. Using vaginal estrogen for dryness. Still has discomfort at times. H/O stroke (vaginal estrogen approved by neurologist). Normal pap history. MDD managed by [facility].   Gynecologic History Patient's last menstrual period was 02/26/2020 (exact date).   Contraception/Family planning: post menopausal status Sexually active: No  Health Maintenance Last Pap: 12/08/2022. Results were: Normal neg HPV Last mammogram (diagnostic): 08/18/2023. Results were: Benign, stable mass right breast Last colonoscopy: 01/20/2021. Results were: Polyps, 5-year recall Last Dexa: Not indicated     06/12/2024   10:47 AM  Depression screen PHQ 2/9  Decreased Interest 1  Down, Depressed, Hopeless 3  PHQ - 2 Score 4  Altered sleeping 3  Tired, decreased energy 3  Change in appetite 2  Feeling bad or failure about yourself  3  Trouble concentrating 3  Moving slowly or fidgety/restless 2  Suicidal thoughts 2  PHQ-9 Score 22     Past medical history, past surgical history, family history and social history were all reviewed and documented in the EPIC chart. Widowed. From [place]. Unemployed due to anxiety and depression. 5 children, 3 here and 2 in her home country.   ROS:  A ROS was performed and pertinent positives and negatives are included.  Exam:  Vitals:   06/12/24 1046  BP: 102/70  Pulse: 72  SpO2: 99%  Weight: 125 lb (56.7 kg)  Height: 5' 5 (1.651 m)    Body mass index is 20.8 kg/m.  General appearance:  Normal Thyroid :  Symmetrical, normal in size, without palpable masses or nodularity. Respiratory  Auscultation:  Clear without wheezing or rhonchi Cardiovascular  Auscultation:  Regular rate, without rubs, murmurs or gallops  Edema/varicosities:  Not grossly evident Abdominal  Soft,nontender, without masses,  guarding or rebound.  Liver/spleen:  No organomegaly noted  Hernia:  None appreciated  Skin  Inspection:  Grossly normal Breasts: Examined lying and sitting.   Right: Without masses, retractions, nipple discharge or axillary adenopathy.   Left: Without masses, retractions, nipple discharge or axillary adenopathy. Pelvic: External genitalia:  no lesions              Urethra:  normal appearing urethra with no masses, tenderness or lesions              Bartholins and Skenes: normal                 Vagina: normal appearing vagina with normal color and discharge, no lesions. Atrophic changes              Cervix: no lesions Bimanual Exam:  Uterus:  no masses or tenderness              Adnexa: no mass, fullness, tenderness              Rectovaginal: Deferred              Anus:  normal, no lesions  Clotilda Pa, CMA present as chaperone.   UA: neg leukocytes, neg nitrites, 2+ blood, neg protein, dark yellow/clear. Microscopic: wbc 0-5, rbc 10-20, few bacteria  Assessment/Plan:  53 y.o. [id number] for annual exam.   Well female exam with routine gynecological exam - Education provided on SBEs, importance of preventative screenings, current guidelines, high calcium diet, regular exercise, and multivitamin daily. Labs with PCP.   Postmenopausal - no HRT, no bleeding  Vaginal atrophy - Plan: estradiol  (ESTRACE ) 0.1 MG/GM vaginal cream 2-3 times weekly.   Burning with urination - Plan: Urinalysis,Complete w/RFL Culture. Negative for UTI. Blood present. Discomfort likely s/t vaginal atrophy. Recommend adding daily external coconut oil.  Other microscopic hematuria - Plan: Ambulatory referral to Urology. Persistent mild microscopic hematuria since January 2025.  Severe episode of recurrent major depressive disorder, without psychotic features (HCC) - PHQ 9 score of 22. Sees psychiatry. Has appt today.   Screening for cervical cancer - Normal pap history. Will repeat at 5-year interval per  guidelines.   Screening for breast cancer - Normal mammogram history.  Continue annual screenings.  Normal breast exam today.  Screening for colon cancer - 2023 colonoscopy. Will repeat at 5-year interval per GI's recommendation.   Screening for osteoporosis - Average risk. Will plan DXA at age 67.   Return in 1 year for annual.     Annabella DELENA Shutter DNP, 11:34 AM 06/12/2024

## 2024-06-14 ENCOUNTER — Ambulatory Visit: Payer: Self-pay | Admitting: Nurse Practitioner

## 2024-06-14 LAB — URINALYSIS, COMPLETE W/RFL CULTURE
Bilirubin Urine: NEGATIVE
Glucose, UA: NEGATIVE
Hyaline Cast: NONE SEEN /LPF
Ketones, ur: NEGATIVE
Leukocyte Esterase: NEGATIVE
Nitrites, Initial: NEGATIVE
Protein, ur: NEGATIVE
Specific Gravity, Urine: 1.023 (ref 1.001–1.035)
pH: 5.5 (ref 5.0–8.0)

## 2024-06-14 LAB — URINE CULTURE
MICRO NUMBER:: 16916768
Result:: NO GROWTH
SPECIMEN QUALITY:: ADEQUATE

## 2024-06-14 LAB — CULTURE INDICATED

## 2024-06-18 ENCOUNTER — Ambulatory Visit: Admitting: Nurse Practitioner

## 2024-06-18 NOTE — Telephone Encounter (Signed)
 Patient was calling because she has not heard from urology referral that was placed on Thursday. I advised patient that referral was sent and gave her the number to [facility].

## 2024-06-18 NOTE — Telephone Encounter (Signed)
 Patient called and left a message on the triage line stating she was concerned about lab results. Tried to return call there was no answer. I left a message for a call back to the triage line.

## 2024-06-24 ENCOUNTER — Encounter (HOSPITAL_COMMUNITY): Payer: Self-pay | Admitting: Psychiatry

## 2024-06-24 ENCOUNTER — Telehealth (HOSPITAL_BASED_OUTPATIENT_CLINIC_OR_DEPARTMENT_OTHER): Admitting: Psychiatry

## 2024-06-24 VITALS — Wt 125.0 lb

## 2024-06-24 DIAGNOSIS — F4312 Post-traumatic stress disorder, chronic: Secondary | ICD-10-CM | POA: Diagnosis not present

## 2024-06-24 DIAGNOSIS — F323 Major depressive disorder, single episode, severe with psychotic features: Secondary | ICD-10-CM

## 2024-06-24 DIAGNOSIS — F410 Panic disorder [episodic paroxysmal anxiety] without agoraphobia: Secondary | ICD-10-CM

## 2024-06-24 MED ORDER — AMITRIPTYLINE HCL 100 MG PO TABS
200.0000 mg | ORAL_TABLET | Freq: Every day | ORAL | 2 refills | Status: AC
Start: 2024-06-24 — End: ?

## 2024-06-24 MED ORDER — CLONAZEPAM 0.5 MG PO TABS: 0.5000 mg | ORAL_TABLET | Freq: Every morning | ORAL | 2 refills | Status: DC

## 2024-06-24 NOTE — Progress Notes (Signed)
 [facility] MD Virtual Progress Note   Patient Location: Home Provider Location: Home Office  I connect with patient by video and verified that I am speaking with correct person by using two identifiers. I discussed the limitations of evaluation and management by telemedicine and the availability of in person appointments. I also discussed with the patient that there may be a patient responsible charge related to this service. The patient expressed understanding and agreed to proceed.  Valerie Richards [id number] 53 y.o.  06/24/2024 10:49 AM  History of Present Illness:  Patient is evaluated by video session.  She reported had [place] but she did not do anything else other than sitting home.  She reported things are very difficult back in her hometown.  She reported mother is not doing very well and have a health issues.  She could not see very well.  She reported mother is 81 year old but looks very older than her age.  She also reported a lot of sickness, parity, homelessness back home.  She had panic attack and she was seen to the nearest emergency room.  She is back and since then no more panic attack.  She admitted feeling very lonely and withdrawn and does not have motivation to do things.  She still have a lot of grudges about her son who does not come and talk to her.  Her daughter who moved to [place] with her husband is looking for a job.  Her other daughter is pregnant and she is the one who comes and helps some time patient.  Patient told she has 2 grand child from the daughter and now she is again pregnant.  She is very happy about the news.  She denies any crying spells or any suicidal thoughts but she has chronic ruminative thoughts, negative thoughts, loneliness.  She is seeing on and off her therapist and also she is on a volunteer list at [facility] [facility] but does not go most of the time.  She enjoyed the company of the daughter and her  grandkids.  She denies any hallucination, paranoia.  She is taking Klonopin  and amitriptyline  which is helping her sleep.  She does pray at night which calm her down.  Her appetite is okay.  Her weight is stable.  No new medication added.  She admitted a lot of nightmares and flashback when she think about her past and trauma.  She reported therapy helps and her plan is to keep her therapy on a regular basis.  Past Psychiatric History: H/O depression, anxiety, childhood trauma and panic attack.  Saw provider few years ago at [facility] and prescribed Lexapro, Prozac, Effexor, mirtazapine  and trazodone  but did not felt it helped.  Prescribed Zoloft , Ambien and hydroxyzine by PCP.  We tried Cymbalta  and Abilify . She was recommended inpatient years ago but did not stay in the hospital.   Past Medical History:  Diagnosis Date   Acute recurrent ethmoidal sinusitis 09/03/2018   Anemia    iron deficiency   Anxiety    Depression    Fibroid    Menorrhagia 01/20/2021   Migraine    Nasal pain 09/03/2018   Stroke (HCC) 12/2020   See MRI.   Vaginal discharge 01/20/2021   Vaginitis and vulvovaginitis 01/20/2021    Outpatient Encounter Medications as of 06/24/2024  Medication Sig   amitriptyline  (ELAVIL ) 100 MG tablet Take 2 tablets (200 mg total) by mouth at bedtime. (Patient not taking:  Reported on 06/12/2024)   Cholecalciferol (VITAMIN D3) 125 MCG (5000 UT) CAPS Take 1 capsule (5,000 Units total) by mouth daily.   clonazePAM  (KLONOPIN ) 0.5 MG tablet Take 1 tablet (0.5 mg total) by mouth every morning.   docusate sodium  (COLACE) 100 MG capsule Take 1 capsule (100 mg total) by mouth 2 (two) times daily as needed for moderate constipation. (Patient not taking: Reported on 06/12/2024)   Erenumab -aooe (AIMOVIG ) 140 MG/ML SOAJ Inject 140 mg into the skin every 28 (twenty-eight) days. (Patient not taking: Reported on 06/12/2024)   estradiol  (ESTRACE ) 0.1 MG/GM vaginal cream Place 1 g vaginally 2 (two) times a  week.   hydrOXYzine (ATARAX) 25 MG tablet Take 12.5-25 mg by mouth every 6 (six) hours as needed.   LINZESS  290 MCG CAPS capsule Take 1 capsule (290 mcg total) by mouth every morning.   ondansetron  (ZOFRAN ) 4 MG tablet Take 1 tablet (4 mg total) by mouth every 8 (eight) hours as needed for nausea or vomiting.   ondansetron  (ZOFRAN -ODT) 4 MG disintegrating tablet 4mg  ODT q4 hours prn nausea/vomit   OXcarbazepine  (TRILEPTAL ) 150 MG tablet Take 1 tablet (150 mg total) by mouth 2 (two) times daily.   pantoprazole  (PROTONIX ) 40 MG tablet Take 1 tablet (40 mg total) by mouth daily.   Rimegepant Sulfate (NURTEC) 75 MG TBDP Take 1 tablet (75 mg total) by mouth as needed (Take one tablet as needed at the earlist onset of a Migraine.  Max 1 tablet in 24 hours).   tiZANidine  (ZANAFLEX ) 4 MG tablet Take 1 tablet (4 mg total) by mouth every 6 (six) hours as needed for muscle spasms.   No facility-administered encounter medications on file as of 06/24/2024.    Recent Results (from the past 2160 hours)  ECHOCARDIOGRAM COMPLETE     Status: None   Collection Time: 05/28/24  1:37 PM  Result Value Ref Range   S' Lateral 2.70 cm   AV Area VTI 2.89 cm2   AV Mean grad 3.0 mmHg   AV Area mean vel 2.75 cm2   AR max vel 3.03 cm2   AV Peak grad 5.4 mmHg   Ao pk vel 1.16 m/s   Est EF 60 - 65%   Urinalysis,Complete w/RFL Culture     Status: Abnormal   Collection Time: 06/12/24 10:57 AM  Result Value Ref Range   Color, Urine DARK YELLOW YELLOW   APPearance CLEAR CLEAR   Specific Gravity, Urine 1.023 1.001 - 1.035    Comment: Verified by repeat analysis. .    pH 5.5 5.0 - 8.0   Glucose, UA NEGATIVE NEGATIVE   Bilirubin Urine NEGATIVE NEGATIVE   Ketones, ur NEGATIVE NEGATIVE   Hgb urine dipstick 2+ (A) NEGATIVE   Protein, ur NEGATIVE NEGATIVE   Nitrites, Initial NEGATIVE NEGATIVE   Leukocyte Esterase NEGATIVE NEGATIVE   WBC, UA 0-5 0 - 5 /HPF   RBC / HPF 10-20 (A) 0 - 2 /HPF   Squamous Epithelial / HPF  0-5 < OR = 5 /HPF   Bacteria, UA FEW (A) NONE SEEN /HPF   Hyaline Cast NONE SEEN NONE SEEN /LPF   Urine-Other      Comment: FEW MUCUS PRESENT URINE CULTURE PENDING    Note      Comment: This urine was analyzed for the presence of WBC,  RBC, bacteria, casts, and other formed elements.  Only those elements seen were reported. . .   Urine Culture     Status: None   Collection  Time: 06/12/24 10:57 AM  Result Value Ref Range   MICRO NUMBER: [id number]    SPECIMEN QUALITY: Adequate    Sample Source URINE    STATUS: FINAL    Result: No Growth   REFLEXIVE URINE CULTURE     Status: None   Collection Time: 06/12/24 10:57 AM  Result Value Ref Range   REFLEXIVE URINE CULTURE      Comment: CULTURE INDICATED - RESULTS TO FOLLOW     Psychiatric Specialty Exam: Physical Exam  Review of Systems  Weight 125 lb (56.7 kg), last menstrual period 02/26/2020.There is no height or weight on file to calculate BMI.  General Appearance: Casual  Eye Contact:  Good  Speech:  Normal Rate  Volume:  Decreased  Mood:  Anxious and Dysphoric  Affect:  Congruent  Thought Process:  Goal Directed  Orientation:  Full (Time, Place, and Person)  Thought Content:  Rumination  Suicidal Thoughts:  No  Homicidal Thoughts:  No  Memory:  Immediate;   Fair Recent;   Fair Remote;   Good  Judgement:  Intact  Insight:  Present  Psychomotor Activity:  Normal  Concentration:  Concentration: Good and Attention Span: Good  Recall:  Good  Fund of Knowledge:  Good  Language:  Good  Akathisia:  No  Handed:  Right  AIMS (if indicated):     Assets:  Communication Skills Desire for Improvement Housing Social Support  ADL's:  Intact  Cognition:  WNL  Sleep:  fair       06/12/2024   10:47 AM 11/09/2023    9:54 AM 08/10/2023    9:04 AM 05/29/2023   10:07 AM 05/10/2023    9:33 AM  Depression screen PHQ 2/9  Decreased Interest 1 2 2 3 3   Down, Depressed, Hopeless 3 3 3 3 3   PHQ - 2 Score 4 5 5 6 6   Altered  sleeping 3 3 3 3 3   Tired, decreased energy 3 3 3 3 3   Change in appetite 2 2 2 3 2   Feeling bad or failure about yourself  3 3 3 3 3   Trouble concentrating 3 3 3 3 3   Moving slowly or fidgety/restless 2 3 3 3 2   Suicidal thoughts 2 2 2 3 3   PHQ-9 Score 22 24 24 27 25   Difficult doing work/chores  Extremely dIfficult Extremely dIfficult  Extremely dIfficult    Assessment/Plan: Severe major depression with psychotic features (HCC) - Plan: clonazePAM  (KLONOPIN ) 0.5 MG tablet, amitriptyline  (ELAVIL ) 100 MG tablet  Chronic post-traumatic stress disorder (PTSD) - Plan: clonazePAM  (KLONOPIN ) 0.5 MG tablet  Panic attacks - Plan: clonazePAM  (KLONOPIN ) 0.5 MG tablet  Discussed loneliness, lack of support and ruminative thoughts.  Encourage walking, going to volunteer work and seeing a therapist on a regular basis.  She admitted this time she will try to stay in the schedule and continue therapy.  She realized medicine is not the solution and she need to come out to do things.  Now rather is better she can walk every day.  She does not want to change the medication since she feels it is helping her sleep and not worsening the depression.  Occasionally nightmares but Klonopin  helps.  Continue amitriptyline  200 mg at bedtime and Klonopin  0.5 mg at bedtime.  Recommend to call back if she is any question or any concern.  Follow-up in 3 months.   Follow Up Instructions:     I discussed the assessment and treatment plan with the patient. The  patient was provided an opportunity to ask questions and all were answered. The patient agreed with the plan and demonstrated an understanding of the instructions.   The patient was advised to call back or seek an in-person evaluation if the symptoms worsen or if the condition fails to improve as anticipated.    Collaboration of Care: Other provider involved in patient's care AEB notes are available in epic to review  Patient/Guardian was advised Release of  Information must be obtained prior to any record release in order to collaborate their care with an outside provider. Patient/Guardian was advised if they have not already done so to contact the registration department to sign all necessary forms in order for us  to release information regarding their care.   Consent: Patient/Guardian gives verbal consent for treatment and assignment of benefits for services provided during this visit. Patient/Guardian expressed understanding and agreed to proceed.     Total encounter time 20 minutes which includes face-to-face time, chart reviewed, care coordination, order entry and documentation during this encounter.   Note: This document was prepared by [employer] voice dictation technology and any errors that results from this process are unintentional.    Leni ONEIDA Client, MD 06/24/2024

## 2024-07-04 ENCOUNTER — Ambulatory Visit (INDEPENDENT_AMBULATORY_CARE_PROVIDER_SITE_OTHER): Admitting: Licensed Clinical Social Worker

## 2024-07-04 DIAGNOSIS — F411 Generalized anxiety disorder: Secondary | ICD-10-CM | POA: Diagnosis not present

## 2024-07-04 DIAGNOSIS — F323 Major depressive disorder, single episode, severe with psychotic features: Secondary | ICD-10-CM

## 2024-07-07 ENCOUNTER — Encounter (HOSPITAL_COMMUNITY): Payer: Self-pay | Admitting: Licensed Clinical Social Worker

## 2024-07-07 NOTE — Progress Notes (Signed)
 Virtual Visit via Video Note  I connected with Valerie Richards on 07/04/24 at  9:00 AM EDT by a video enabled telemedicine application and verified that I am speaking with the correct person using two identifiers.  Location: Patient: home Provider: home office   I discussed the limitations of evaluation and management by telemedicine and the availability of in person appointments. The patient expressed understanding and agreed to proceed.   I discussed the assessment and treatment plan with the patient. The patient was provided an opportunity to ask questions and all were answered. The patient agreed with the plan and demonstrated an understanding of the instructions.   The patient was advised to call back or seek an in-person evaluation if the symptoms worsen or if the condition fails to improve as anticipated.  I provided 45 minutes of non-face-to-face time during this encounter.   Valerie JONELLE Rosser, LCSW   THERAPIST PROGRESS NOTE  Session Time: 9:00am-9:45am  Participation Level: Active  Behavioral Response: NeatAlertAnxious and Euthymic  Type of Therapy: Individual Therapy  Treatment Goals addressed: LTG: Recall traumatic events without becoming overwhelmed with negative emotions (BH CCP Acute or Chronic Trauma Reaction) Disciplines:  Interdisciplinary, PROVIDER Expected end:  06/20/24 STG: Valerie Richards will verbalize an increased sense of mastery over PTSD symptoms by using several techniques to cope with flashbacks, decrease the power of triggers, and decrease negative thinking (BH CCP Acute or Chronic Trauma Reaction) Disciplines:  Interdisciplinary, PROVIDER Expected end:  06/20/24  ProgressTowards Goals: Progressing  Interventions: CBT  Summary: Valerie Richards is a 53 y.o. female who presents with MDD, moderate and GAD.   Suicidal/Homicidal: Nowithout intent/plan  Therapist Response: Valerie Richards engaged well in individual virtual session with clinician. Clinician  utilized CBT to process thoughts, feelings and interactions with friends and family. 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 Clinician processed this experience, noting concern that these friends tend to gossip. However, she shared she would like to be a part of their group and to have options to be social. Clinician explored anxiety about not having a way out of uncomfortable situations. Clinician identified the transporting herself helps and also being outdoors where she can walk a little bit is also helpful. Valerie Richards shared this activity is a picnic at the park, which seems to be ideal. Clinician reviewed options with medication management, encouraging her to take 1/2 dose right before she goes, in order to feel calm and not drugged, walk around a little during the event, and to not participate in gossip if that happens.   Plan: Return again in 2 weeks.  Diagnosis: Severe major depression with psychotic features (HCC)  GAD (generalized anxiety disorder)  Collaboration of Care: Medication Management AEB reviewed notes from dr.  Tera was advised Release of Information must be obtained prior to any record release in order to collaborate their care with an outside provider. Patient/Guardian was advised if they have not already done so to contact the registration department to sign all necessary forms in order for us  to release information regarding their care.   Consent: Patient/Guardian gives verbal consent for treatment and assignment of benefits for services provided during this visit. Patient/Guardian expressed understanding and agreed to proceed.   Valerie JONELLE [place], LCSW 07/07/2024

## 2024-07-23 ENCOUNTER — Ambulatory Visit (INDEPENDENT_AMBULATORY_CARE_PROVIDER_SITE_OTHER): Admitting: Licensed Clinical Social Worker

## 2024-07-23 DIAGNOSIS — F4310 Post-traumatic stress disorder, unspecified: Secondary | ICD-10-CM

## 2024-07-23 DIAGNOSIS — F323 Major depressive disorder, single episode, severe with psychotic features: Secondary | ICD-10-CM

## 2024-07-24 ENCOUNTER — Other Ambulatory Visit: Payer: Self-pay | Admitting: Family

## 2024-07-24 DIAGNOSIS — K5909 Other constipation: Secondary | ICD-10-CM

## 2024-07-24 DIAGNOSIS — R3121 Asymptomatic microscopic hematuria: Secondary | ICD-10-CM | POA: Diagnosis not present

## 2024-07-29 ENCOUNTER — Encounter (HOSPITAL_COMMUNITY): Payer: Self-pay | Admitting: Licensed Clinical Social Worker

## 2024-07-29 NOTE — Progress Notes (Deleted)
 NEUROLOGY FOLLOW UP OFFICE NOTE  Valerie Richards [id number]  Assessment/Plan:   Migraine without aura, without status migrainosus, not intractable, possibly cervicogenic  Left sided trigeminal neuralgia Memory deficits Right sided lumbar radiculopathy Cervicalgia with left sided cervical radiculopathy    Migraine prevention: Restart Ajovy .   For trigeminal neuralgia:  Restart oxcarbazepine  150mg  twice daily.  If no improvement in 4 weeks, will increase to 300mg  twice daily.  Check BMP in 2 months. Migraine rescue:  Will resubmit for approval of Nurtec as she has failed Ubrelvy Tizanidine  4mg  up to 3 times daily as needed for neck pain For workup of memory deficits:  MRI of brain, B12, TSH For further workup of trigeminal neuralgia:  MRI of trigeminal nerves with and without contrast Follow up 6 months.     Subjective:  Valerie Richards is a 53 year old female with history of iron-deficiency anemia who follows up for migraine and cervical radiculopathy   UPDATE:  Migraines: Restarted Ajovy . Intensity:  moderate to (rarely) severe Duration: *** with Nurtec  Frequency:  ***  Trigeminal neuralgia: Did not have MRI of trigeminal nerve performed.  *** Restarted oxcarbazepine . ***  Other: Sometimes notes stabbing pain in her left big toe at night.  Has known chronic low back pain with bilateral radiculopathy.  Failed gabapentin, PT and epidural injections. Sometimes her tongue feels heavy when she speaks Reports short-term memory problems.  Labs from 01/15/2024 revealed B12 526 and TSH 1.39.  Current medications: Current NSAIDs/analgesics:  paracetamol (acetaminophen ) Current antidepressant:  amitriptyline  150mg  at bedtime (prescribed by psychiatry) Current antiepileptic:  oxcarbazepine  150mg  twice daily, lamotrigine  25mg  daily (psychiatry) Current CGRP inhibitor:  Ajovy , Nurtec PRN Muscle relaxant/neck pain:  tizanidine  2-4mg  at bedtime    HISTORY: Migraine: Beginning  around September 2021, she developed new onset headaches.  They are severe right frontal pounding headaches associated with nausea, photophobia and sometimes phonophobia.  They sometimes wake her up at night from sleep.  It lasts 2 hours with Tylenol .  They occur every other day.  No known triggers.    Past medication:  topiramate , propranolol  (hypotension), duloxetine , Ubrelvy, gabapentin  Dizziness: Around this same time, she started having dizziness.  It is positional.  It is a spinning sensation lasting seconds to a couple of minutes.  Associated with blurred vision and sometimes nausea.  Occurs every other day to twice a day.    Cervical spondylosis with radiculopathy: She has chronic neck pain radiating into the right shoulder.  X-ray cervical spine from 06/17/2020 personally reviewed showed degenerative straightening of the cervical lordosis with focally moderate disc space height loss and osteophytosis of C6-C7.  She then started endorsing aching pain on the left side of her neck and arm, sometimes leg.  Also left eye twitches.  Pain in the left knee and ankle.  Reports numbness and tingling in fingers and toes, particularly when she is feeling anxious. Did not respond to PT.  MRI of C-spine on 07/07/2021  showed cervical spondylosis with with minimal spinal stenosis at C3-4 and mild bilateral neural foraminal narrowing at C5-6.  5 mm lesion within C7 vertebral body noted, nonspecific but may reflect an atypical hemangioma.   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   NCV-EMG of left upper extremity on 07/27/2023 was normal.  Advised to follow up with Sports Medicine.  Right-sided Trigeminal Neuralgia: Beginning in September 2023, she reports paroxsymal shooting pain in  the right V2-V3 distribution with right sided facial numbness.  Occurs every other day.     MRI brain with and without  contrast on 12/31/2020 showed paranasal sinus disease and 3 mm chronic right cerebellar infarct but no acute abnormality.  Advised to start ASA but stopped because it caused bruising.    PAST MEDICAL HISTORY: Past Medical History:  Diagnosis Date   Acute recurrent ethmoidal sinusitis 09/03/2018   Anemia    iron deficiency   Anxiety    Depression    Fibroid    Menorrhagia 01/20/2021   Migraine    Nasal pain 09/03/2018   Stroke (HCC) 12/2020   See MRI.   Vaginal discharge 01/20/2021   Vaginitis and vulvovaginitis 01/20/2021    MEDICATIONS: Current Outpatient Medications on File Prior to Visit  Medication Sig Dispense Refill   amitriptyline  (ELAVIL ) 100 MG tablet Take 2 tablets (200 mg total) by mouth at bedtime. 60 tablet 2   Cholecalciferol (VITAMIN D3) 125 MCG (5000 UT) CAPS Take 1 capsule (5,000 Units total) by mouth daily. 90 capsule 3   clonazePAM  (KLONOPIN ) 0.5 MG tablet Take 1 tablet (0.5 mg total) by mouth every morning. 30 tablet 2   docusate sodium  (COLACE) 100 MG capsule Take 1 capsule (100 mg total) by mouth 2 (two) times daily as needed for moderate constipation. (Patient not taking: Reported on 06/12/2024) 60 capsule 5   Erenumab -aooe (AIMOVIG ) 140 MG/ML SOAJ Inject 140 mg into the skin every 28 (twenty-eight) days. (Patient not taking: Reported on 06/12/2024) 1.12 mL 11   estradiol  (ESTRACE ) 0.1 MG/GM vaginal cream Place 1 g vaginally 2 (two) times a week. 43 g 2   hydrOXYzine (ATARAX) 25 MG tablet Take 12.5-25 mg by mouth every 6 (six) hours as needed.     LINZESS  290 MCG CAPS capsule Take 1 capsule by mouth in the morning 30 capsule 0   ondansetron  (ZOFRAN ) 4 MG tablet Take 1 tablet (4 mg total) by mouth every 8 (eight) hours as needed for nausea or vomiting. 20 tablet 0   ondansetron  (ZOFRAN -ODT) 4 MG disintegrating tablet 4mg  ODT q4 hours prn nausea/vomit 20 tablet 0   OXcarbazepine  (TRILEPTAL ) 150 MG tablet Take 1 tablet (150 mg total) by mouth 2 (two) times daily. 60  tablet 5   pantoprazole  (PROTONIX ) 40 MG tablet Take 1 tablet (40 mg total) by mouth daily. 90 tablet 0   Rimegepant Sulfate (NURTEC) 75 MG TBDP Take 1 tablet (75 mg total) by mouth as needed (Take one tablet as needed at the earlist onset of a Migraine.  Max 1 tablet in 24 hours). 16 tablet 5   tiZANidine  (ZANAFLEX ) 4 MG tablet Take 1 tablet (4 mg total) by mouth every 6 (six) hours as needed for muscle spasms. 30 tablet 5   No current facility-administered medications on file prior to visit.    ALLERGIES: No Known Allergies  FAMILY HISTORY: Family History  Problem Relation Age of Onset   Cancer Mother    Hypertension Mother    Cancer Father    Diabetes Father    Colon cancer Father    Kidney failure Father    Diabetes Brother       Objective:  ***. General: No acute distress.  Patient appears well-groomed.   ***   Juliene Dunnings, DO  CC: Corean Comment, NP

## 2024-07-29 NOTE — Progress Notes (Signed)
 Virtual Visit via Video Note  I connected with Valerie Richards on 07/23/24 at  9:00 AM EDT by a video enabled telemedicine application and verified that I am speaking with the correct person using two identifiers.  Location: Patient: daughter's home Provider: office   I discussed the limitations of evaluation and management by telemedicine and the availability of in person appointments. The patient expressed understanding and agreed to proceed.      I discussed the assessment and treatment plan with the patient. The patient was provided an opportunity to ask questions and all were answered. The patient agreed with the plan and demonstrated an understanding of the instructions.   The patient was advised to call back or seek an in-person evaluation if the symptoms worsen or if the condition fails to improve as anticipated.  I provided 45 minutes of non-face-to-face time during this encounter.   Valerie JONELLE Rosser, LCSW   THERAPIST PROGRESS NOTE  Session Time: 9:00am-9:45am  Participation Level: Active  Behavioral Response: CasualLethargicAnxious and Depressed  Type of Therapy: Individual Therapy  Treatment Goals addressed: Treatment Goals addressed: LTG: Recall traumatic events without becoming overwhelmed with negative emotions (BH CCP Acute or Chronic Trauma Reaction) Disciplines:  Interdisciplinary, PROVIDER Expected end:  06/20/24 STG: Valerie Richards will verbalize an increased sense of mastery over PTSD symptoms by using several techniques to cope with flashbacks, decrease the power of triggers, and decrease negative thinking (BH CCP Acute or Chronic Trauma Reaction) Disciplines:  Interdisciplinary, PROVIDER Expected end:  06/20/24  ProgressTowards Goals: Not Progressing  Interventions: CBT  Summary: Valerie Richards is a 53 y.o. female who presents with MDD, severe with psychotic features, PTSD.   Suicidal/Homicidal: Nowithout intent/plan  Therapist Response: Valerie Richards  engaged well in individual virtual session with clinician. Clinician utilized CBT to process thoughts, feelings, and behaviors. Clinician identified that Valerie Richards was struggling badly at home by herself, feeling paranoid, being haunted by her deceased husband. Clinician explored these feelings and identified guilt over his death. Clinician processed the event of his death, noting that she felt pressured by the dr and nurses to take him off the machine too early, which brought his death earlier than possibly he was ready. Clinician discussed feelings of guilt, anger, sadness, and she realizes that she should have made a choice to wait until he was ready to die on his own. Clinician explored ways to forgive herself or ways to accept responsibility and give herself an opportunity to make amends.  Plan: Return again in 2 weeks.  Diagnosis: Severe major depression with psychotic features (HCC)  PTSD (post-traumatic stress disorder)  Collaboration of Care: Psychiatrist AEB informed Dr. Arfeen. Valerie Richards has an appt soon with him.   Patient/Guardian was advised Release of Information must be obtained prior to any record release in order to collaborate their care with an outside provider. Patient/Guardian was advised if they have not already done so to contact the registration department to sign all necessary forms in order for us  to release information regarding their care.   Consent: Patient/Guardian gives verbal consent for treatment and assignment of benefits for services provided during this visit. Patient/Guardian expressed understanding and agreed to proceed.   Valerie JONELLE [place], LCSW 07/29/2024

## 2024-07-30 ENCOUNTER — Ambulatory Visit: Admitting: Neurology

## 2024-08-02 ENCOUNTER — Other Ambulatory Visit: Payer: Self-pay | Admitting: Family

## 2024-08-02 DIAGNOSIS — Z1231 Encounter for screening mammogram for malignant neoplasm of breast: Secondary | ICD-10-CM

## 2024-08-08 ENCOUNTER — Encounter (HOSPITAL_COMMUNITY): Payer: Self-pay | Admitting: Licensed Clinical Social Worker

## 2024-08-08 ENCOUNTER — Ambulatory Visit (INDEPENDENT_AMBULATORY_CARE_PROVIDER_SITE_OTHER): Admitting: Licensed Clinical Social Worker

## 2024-08-08 DIAGNOSIS — F411 Generalized anxiety disorder: Secondary | ICD-10-CM

## 2024-08-08 DIAGNOSIS — F323 Major depressive disorder, single episode, severe with psychotic features: Secondary | ICD-10-CM

## 2024-08-08 NOTE — Progress Notes (Signed)
 Virtual Visit via Video Note  I connected with Valerie Richards on 08/08/24 at  9:00 AM EDT by a video enabled telemedicine application and verified that I am speaking with the correct person using two identifiers.  Location: Patient: daughter's home Provider: home office   I discussed the limitations of evaluation and management by telemedicine and the availability of in person appointments. The patient expressed understanding and agreed to proceed.     I discussed the assessment and treatment plan with the patient. The patient was provided an opportunity to ask questions and all were answered. The patient agreed with the plan and demonstrated an understanding of the instructions.   The patient was advised to call back or seek an in-person evaluation if the symptoms worsen or if the condition fails to improve as anticipated.  I provided 45 minutes of non-face-to-face time during this encounter.   Harlene JONELLE Rosser, LCSW   THERAPIST PROGRESS NOTE  Session Time: 9:00am-9:45am  Participation Level: Active  Behavioral Response: NeatAlertAnxious and Depressed  Type of Therapy: Individual Therapy  Treatment Goals addressed: LTG: Recall traumatic events without becoming overwhelmed with negative emotions (BH CCP Acute or Chronic Trauma Reaction) Disciplines:  Interdisciplinary, PROVIDER Expected end:  06/20/24 STG: Valerie Richards will verbalize an increased sense of mastery over PTSD symptoms by using several techniques to cope with flashbacks, decrease the power of triggers, and decrease negative thinking (BH CCP Acute or Chronic Trauma Reaction) Disciplines:  Interdisciplinary, PROVIDER Expected end:  06/20/24  ProgressTowards Goals: Not Progressing  Interventions: CBT and Motivational Interviewing  Summary: Valerie Richards is a 53 y.o. female who presents with MDD, severe and GAD.    Suicidal/Homicidal: Nowithout intent/plan  Therapist Response: Valerie Richards engaged well in individual  virtual session with clinician. Clinician utilized CBT and MI to process thoughts, feelings, and interactions. Valerie Richards shared that she has been experiencing severe anxiety and it has been bad since last session. She reports that her neighbor had a break in and this has really triggered Valerie Richards's sxs. Valerie Richards shared that her daughter had to come get her late last night due to extreme anxiety and fear. Clinician explored options for coping, but noted that the home she lives in is a constant trigger for anxiety, fear, and guilt. Clinician processed continuing guilt over her role in making the decision to end her husband's life support. Clinician discussed the importance of self-forgiveness in order to improve anxiety and guilt. Lakhia shared this does not seem possible, as she imagines her husband in the house now all day, every day. Clinician discussed spiritual aspect of her religion and how this can be addressed through her prayers. Clinician also explored options for Taeko to move to a new home, away from the ghosts of the past.   Plan: Return again in 2 weeks.  Diagnosis: Severe major depression with psychotic features (HCC)  GAD (generalized anxiety disorder)  Collaboration of Care: Psychiatrist AEB updated Dr. Arfeen  Patient/Guardian was advised Release of Information must be obtained prior to any record release in order to collaborate their care with an outside provider. Patient/Guardian was advised if they have not already done so to contact the registration department to sign all necessary forms in order for us  to release information regarding their care.   Consent: Patient/Guardian gives verbal consent for treatment and assignment of benefits for services provided during this visit. Patient/Guardian expressed understanding and agreed to proceed.   Harlene JONELLE [place], LCSW 08/08/2024

## 2024-08-14 ENCOUNTER — Other Ambulatory Visit: Payer: Self-pay | Admitting: Family

## 2024-08-14 DIAGNOSIS — K5909 Other constipation: Secondary | ICD-10-CM

## 2024-08-22 ENCOUNTER — Ambulatory Visit (INDEPENDENT_AMBULATORY_CARE_PROVIDER_SITE_OTHER): Admitting: Licensed Clinical Social Worker

## 2024-08-22 DIAGNOSIS — F4310 Post-traumatic stress disorder, unspecified: Secondary | ICD-10-CM

## 2024-08-22 DIAGNOSIS — F323 Major depressive disorder, single episode, severe with psychotic features: Secondary | ICD-10-CM

## 2024-08-27 ENCOUNTER — Other Ambulatory Visit: Payer: Self-pay | Admitting: Urology

## 2024-08-27 DIAGNOSIS — R3121 Asymptomatic microscopic hematuria: Secondary | ICD-10-CM

## 2024-08-27 DIAGNOSIS — R350 Frequency of micturition: Secondary | ICD-10-CM

## 2024-08-27 DIAGNOSIS — N3946 Mixed incontinence: Secondary | ICD-10-CM

## 2024-08-28 ENCOUNTER — Encounter: Payer: Self-pay | Admitting: Nurse Practitioner

## 2024-08-28 ENCOUNTER — Ambulatory Visit (INDEPENDENT_AMBULATORY_CARE_PROVIDER_SITE_OTHER): Admitting: Nurse Practitioner

## 2024-08-28 VITALS — BP 106/68 | HR 72

## 2024-08-28 DIAGNOSIS — D219 Benign neoplasm of connective and other soft tissue, unspecified: Secondary | ICD-10-CM | POA: Diagnosis not present

## 2024-08-28 DIAGNOSIS — N958 Other specified menopausal and perimenopausal disorders: Secondary | ICD-10-CM

## 2024-08-28 DIAGNOSIS — N952 Postmenopausal atrophic vaginitis: Secondary | ICD-10-CM | POA: Diagnosis not present

## 2024-08-28 MED ORDER — ESTRADIOL 0.01 % VA CREA: 1.0000 g | TOPICAL_CREAM | VAGINAL | 0 refills | Status: DC

## 2024-08-28 NOTE — Progress Notes (Signed)
   Acute Office Visit  Subjective:    Patient ID: Valerie Richards, female    DOB: 1971/04/22, 53 y.o.   MRN: [id number]   HPI 53 y.o. presents today to discuss fibroids. Saw urology for hematuria in October, CT scan showed 5 cm fibroid and was told by urology to follow up with GYN. Patient not concerned and knows she has fibroids. US  2021 showed 2 fibroids measuring 5.8 cm and 2.2 cm. MRI recommended by urology for liver lesion. Does feel very dry and has vulvar burning at times. This is not new for her. Stopped vaginal estrogen due to worry about h/o stroke.   Patient's last menstrual period was 02/26/2020 (exact date).    Review of Systems  Constitutional: Negative.   Genitourinary:  Positive for vaginal pain (Vulvar burning).       Objective:    Physical Exam Constitutional:      Appearance: Normal appearance.     BP 106/68   Pulse 72   LMP 02/26/2020 (Exact Date)   SpO2 98%  Wt Readings from Last 3 Encounters:  06/12/24 125 lb (56.7 kg)  01/17/24 124 lb 4 oz (56.4 kg)  01/15/24 125 lb (56.7 kg)        Assessment & Plan:   Problem List Items Addressed This Visit   None Visit Diagnoses       Vaginal atrophy    -  Primary   Relevant Medications   estradiol  (ESTRACE ) 0.01 % CREA vaginal cream (Start on 08/29/2024)     Genitourinary syndrome of menopause       Relevant Medications   estradiol  (ESTRACE ) 0.01 % CREA vaginal cream (Start on 08/29/2024)     Fibroid          Plan: Renal CT scan report reviewed with patient. One small fibroid present and not of any concern. Recommend restarting vaginal estrogen. Use nightly first 2 weeks, then decrease to twice weekly. Discussed removal of black box warning for blood clot risk for vaginal estrogen.   Return if symptoms worsen or fail to improve.    Annabella DELENA Shutter DNP, 2:57 PM 08/28/2024

## 2024-08-29 ENCOUNTER — Other Ambulatory Visit: Payer: Self-pay | Admitting: Nurse Practitioner

## 2024-08-29 DIAGNOSIS — N958 Other specified menopausal and perimenopausal disorders: Secondary | ICD-10-CM

## 2024-08-29 DIAGNOSIS — N952 Postmenopausal atrophic vaginitis: Secondary | ICD-10-CM

## 2024-08-29 MED ORDER — ESTRADIOL 0.01 % VA CREA: 1.0000 g | TOPICAL_CREAM | VAGINAL | 0 refills | Status: AC

## 2024-08-30 ENCOUNTER — Ambulatory Visit
Admission: RE | Admit: 2024-08-30 | Discharge: 2024-08-30 | Disposition: A | Discharge status: Home / Self Care | Source: Ambulatory Visit | Attending: Family | Admitting: Family

## 2024-08-30 DIAGNOSIS — Z1231 Encounter for screening mammogram for malignant neoplasm of breast: Secondary | ICD-10-CM

## 2024-09-02 ENCOUNTER — Telehealth (HOSPITAL_COMMUNITY): Admitting: Psychiatry

## 2024-09-02 ENCOUNTER — Encounter (HOSPITAL_COMMUNITY): Payer: Self-pay

## 2024-09-04 ENCOUNTER — Telehealth: Payer: Self-pay | Admitting: Radiology

## 2024-09-04 ENCOUNTER — Ambulatory Visit (HOSPITAL_COMMUNITY): Admitting: Licensed Clinical Social Worker

## 2024-09-04 ENCOUNTER — Encounter (HOSPITAL_COMMUNITY): Payer: Self-pay | Admitting: Licensed Clinical Social Worker

## 2024-09-04 NOTE — Progress Notes (Signed)
 Virtual Visit via Video Note  I connected with Valerie Richards on 08/22/24 at 10:00 AM EST by a video enabled telemedicine application and verified that I am speaking with the correct person using two identifiers.  Location: Patient: home Provider: home office   I discussed the limitations of evaluation and management by telemedicine and the availability of in person appointments. The patient expressed understanding and agreed to proceed.  I discussed the assessment and treatment plan with the patient. The patient was provided an opportunity to ask questions and all were answered. The patient agreed with the plan and demonstrated an understanding of the instructions.   The patient was advised to call back or seek an in-person evaluation if the symptoms worsen or if the condition fails to improve as anticipated.  I provided 45 minutes of non-face-to-face time during this encounter.   Harlene JONELLE Rosser, LCSW   THERAPIST PROGRESS NOTE  Session Time: 10:00am-10:45am  Participation Level: Active  Behavioral Response: CasualAlertAnxious and Depressed  Type of Therapy: Individual Therapy  Treatment Goals addressed: LTG: Recall traumatic events without becoming overwhelmed with negative emotions (BH CCP Acute or Chronic Trauma Reaction) Disciplines:  Interdisciplinary, PROVIDER Expected end:  06/20/24 STG: Valerie Richards will verbalize an increased sense of mastery over PTSD symptoms by using several techniques to cope with flashbacks, decrease the power of triggers, and decrease negative thinking (BH CCP Acute or Chronic Trauma Reaction) Disciplines:  Interdisciplinary, PROVIDER Expected end:  06/20/24  ProgressTowards Goals: Not Progressing  Interventions: CBT  Summary: Valerie Richards is a 53 y.o. female who presents with MDD severe with psychotic features and PTSD.   Suicidal/Homicidal: Nowithout intent/plan  Therapist Response: Valerie Richards engaged well in individual virtual session  with clinician. Clinician utilized CBT to explore thoughts, feelings, and interactions. Valerie Richards shared continuing fear and hallucinations, paranoia at her home. She reports this is less when someone is at her home with her. However, she does not feel able to have her daughters stay with her all the time and she does not want to move in with them. Clinician explored ways to alternate times together for now until the house can be sold. Clinician processed the noises and sense of fear that has been present since her husband died. Valerie Richards shared high levels of guilt and shame over making the decision to pull the plug on her husband. She reports she should have let him go on his own. Clinician reflected these thoughts and identified that while this may or may not be true, the guilt she feels is haunting her. Clinician explored options for self-forgiveness.   Plan: Return again in 2 weeks.  Diagnosis: Severe major depression with psychotic features (HCC)  PTSD (post-traumatic stress disorder)  Collaboration of Care: Psychiatrist AEB updated Dr. Arfeen and requested consult sooner. Discussed the psychotic features and explored options for anti-psychotic meds to assist with hallucinations and paranoia.  Patient/Guardian was advised Release of Information must be obtained prior to any record release in order to collaborate their care with an outside provider. Patient/Guardian was advised if they have not already done so to contact the registration department to sign all necessary forms in order for us  to release information regarding their care.   Consent: Patient/Guardian gives verbal consent for treatment and assignment of benefits for services provided during this visit. Patient/Guardian expressed understanding and agreed to proceed.   Harlene JONELLE [place], LCSW 09/04/2024

## 2024-09-04 NOTE — Telephone Encounter (Signed)
 MM DIGITAL SCREENING BILAT W/ TOMO AND CAD Mark to be read ASAP.

## 2024-09-10 ENCOUNTER — Ambulatory Visit (HOSPITAL_COMMUNITY): Admitting: Licensed Clinical Social Worker

## 2024-09-10 DIAGNOSIS — F323 Major depressive disorder, single episode, severe with psychotic features: Secondary | ICD-10-CM

## 2024-09-10 DIAGNOSIS — F4310 Post-traumatic stress disorder, unspecified: Secondary | ICD-10-CM

## 2024-09-10 NOTE — Progress Notes (Unsigned)
 Virtual Visit via Video Note  I connected with Valerie Richards on 09/10/24 at  9:00 AM EST by a video enabled telemedicine application and verified that I am speaking with the correct person using two identifiers.  Location: Patient: daughter's home Provider: office   I discussed the limitations of evaluation and management by telemedicine and the availability of in person appointments. The patient expressed understanding and agreed to proceed.   I discussed the assessment and treatment plan with the patient. The patient was provided an opportunity to ask questions and all were answered. The patient agreed with the plan and demonstrated an understanding of the instructions.   The patient was advised to call back or seek an in-person evaluation if the symptoms worsen or if the condition fails to improve as anticipated.  I provided 40 minutes of non-face-to-face time during this encounter.   Harlene JONELLE Rosser, LCSW   THERAPIST PROGRESS NOTE  Session Time: 9:10am-9:50am  Participation Level: Active  Behavioral Response: CasualAlertAnxious and Depressed  Type of Therapy: Individual Therapy  Treatment Goals addressed: LTG: Recall traumatic events without becoming overwhelmed with negative emotions (BH CCP Acute or Chronic Trauma Reaction) Disciplines:  Interdisciplinary, PROVIDER Expected end:  06/20/24 STG: Valerie Richards will verbalize an increased sense of mastery over PTSD symptoms by using several techniques to cope with flashbacks, decrease the power of triggers, and decrease negative thinking (BH CCP Acute or Chronic Trauma Reaction) Disciplines:  Interdisciplinary, PROVIDER Expected end:  06/20/24  ProgressTowards Goals: Progressing  Interventions: Motivational Interviewing and Supportive  Summary: Valerie Richards is a 53 y.o. female who presents with Severe MDD with psychotic features and PTSD.   Suicidal/Homicidal: Nowithout intent/plan  Therapist Response: Valerie Richards engaged  well in virtual session with clinician. Clinician utilized MI and Supportive counseling to process thoughts, feelings, and behaviors. Valerie Richards shared that she continues to struggle with severe depression, anxiety, and PTSD sxs. She identified that she has not been able to stay in her house alone for many days and her daughter has been picking her up so she can stay there. Clinician explored auditory hallucinations, noting that she hears her deceased husband following her around and demanding to know why she agreed to take him off life support. Clinician processed the guilt she feels and processed ways to forgive herself.   Plan: Return again in 2 weeks.  Diagnosis: Severe major depression with psychotic features (HCC)  PTSD (post-traumatic stress disorder)  Collaboration of Care: Psychiatrist AEB updated Dr. Arfeen  Patient/Guardian was advised Release of Information must be obtained prior to any record release in order to collaborate their care with an outside provider. Patient/Guardian was advised if they have not already done so to contact the registration department to sign all necessary forms in order for us  to release information regarding their care.   Consent: Patient/Guardian gives verbal consent for treatment and assignment of benefits for services provided during this visit. Patient/Guardian expressed understanding and agreed to proceed.   Harlene JONELLE [place], LCSW 09/10/2024

## 2024-09-13 ENCOUNTER — Ambulatory Visit: Admitting: Family

## 2024-09-13 ENCOUNTER — Encounter: Payer: Self-pay | Admitting: Family

## 2024-09-13 VITALS — BP 118/72 | HR 65 | Temp 97.2°F | Ht 65.0 in | Wt 130.2 lb

## 2024-09-13 DIAGNOSIS — K5909 Other constipation: Secondary | ICD-10-CM | POA: Diagnosis not present

## 2024-09-13 DIAGNOSIS — R413 Other amnesia: Secondary | ICD-10-CM

## 2024-09-13 MED ORDER — LINZESS 290 MCG PO CAPS: 290.0000 ug | ORAL_CAPSULE | Freq: Every morning | ORAL | 1 refills | Status: AC

## 2024-09-13 NOTE — Progress Notes (Signed)
 Patient ID: Valerie Richards, female    DOB: December 21, 1970, 53 y.o.   MRN: [id number]  Chief Complaint  Patient presents with   Altered Mental Status    Pt c/o confusion and unable to focus. Present for a few months.   Discussed the use of AI scribe software for clinical note transcription with the patient, who gave verbal consent to proceed.  History of Present Illness Valerie Richards is a 53 year old female who presents with memory loss and cognitive difficulties.  She reports frequent memory loss and difficulty focusing, often forgetting things even after writing them down. She sometimes takes wrong routes to familiar places before realizing it. These issues have become frequent and worrisome. She has headaches treated with nightly amitriptyline  and Nurtec as needed, but still has intermittent severe headaches, including last night and this morning. She takes clonazepam  daily and has been on it long term. She recalls a prior CT and an upcoming MRI to evaluate a spot on her liver or kidney, and has had fibroids. She is frustrated by poor communication about test results. 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 She has back pain and nerve symptoms managed with over-the-counter medication. She had a normal mammogram in November but has not yet received the mailed results and is concerned about the lack of communication.  Assessment & Plan Memory impairment Recent memory impairment with differential diagnosis including clonazepam  side effect, mild cognitive impairment, early dementia, menopause-related brain fog, and ADHD. - Discuss memory issues with neurologist and psychiatrist. - Consider stopping clonazepam  temporarily to assess impact on memory. - Discuss potential ADHD evaluation with psychiatrist. - Consider non-stimulant ADHD medication like Strattera under psychiatric care if diagnosed.  Chronic constipation Managed with Linzess . Denies any SE. -  Refill sent for Linzess  qd. - F/U in 6 mos  General Health Maintenance Established with gynecology for vaginal estrogen therapy, normal recent mammogram, scheduled MRI for liver spot evaluation, MRI phobia noted. - Ensure correct address for receiving mammogram results. - Proceed with scheduled MRI of the abdomen. - Consider taking Klonopin  before MRI to manage anxiety.  Subjective:    Outpatient Medications Prior to Visit  Medication Sig Dispense Refill   amitriptyline  (ELAVIL ) 100 MG tablet Take 2 tablets (200 mg total) by mouth at bedtime. 60 tablet 2   clonazePAM  (KLONOPIN ) 0.5 MG tablet Take 1 tablet (0.5 mg total) by mouth every morning. 30 tablet 2   estradiol  (ESTRACE ) 0.01 % CREA vaginal cream Place 1 g vaginally 2 (two) times a week. Initial dose: nightly x 2 weeks, then twice weekly 42.5 g 0   LINZESS  290 MCG CAPS capsule Take 1 capsule by mouth in the morning 30 capsule 0   pantoprazole  (PROTONIX ) 40 MG tablet Take 1 tablet (40 mg total) by mouth daily. 90 tablet 0   Rimegepant Sulfate (NURTEC) 75 MG TBDP Take 1 tablet (75 mg total) by mouth as needed (Take one tablet as needed at the earlist onset of a Migraine.  Max 1 tablet in 24 hours). 16 tablet 5   No facility-administered medications prior to visit.   Past Medical History:  Diagnosis Date   Acute recurrent ethmoidal sinusitis 09/03/2018   Anemia    iron deficiency   Anxiety    Depression    Fibroid    Menorrhagia 01/20/2021   Migraine    Nasal pain 09/03/2018   Stroke (HCC) 12/2020   See MRI.  Vaginal discharge 01/20/2021   Vaginitis and vulvovaginitis 01/20/2021   Past Surgical History:  Procedure Laterality Date   EYE SURGERY Bilateral    TONSILLECTOMY     No Known Allergies    Objective:    Physical Exam Vitals and nursing note reviewed.  Constitutional:      Appearance: Normal appearance.  Cardiovascular:     Rate and Rhythm: Normal rate and regular rhythm.  Pulmonary:     Effort:  Pulmonary effort is normal.     Breath sounds: Normal breath sounds.  Musculoskeletal:        General: Normal range of motion.  Skin:    General: Skin is warm and dry.  Neurological:     Mental Status: She is alert.  Psychiatric:        Mood and Affect: Mood normal.        Behavior: Behavior normal.    BP 118/72 (BP Location: Left Arm, Patient Position: Sitting, Cuff Size: Large)   Pulse 65   Temp (!) 97.2 F (36.2 C) (Temporal)   Ht 5' 5 (1.651 m)   Wt 130 lb 4 oz (59.1 kg)   LMP 02/26/2020 (Exact Date)   SpO2 96%   BMI 21.67 kg/m  Wt Readings from Last 3 Encounters:  09/13/24 130 lb 4 oz (59.1 kg)  06/12/24 125 lb (56.7 kg)  01/17/24 124 lb 4 oz (56.4 kg)      Lucius Krabbe, NP

## 2024-09-16 ENCOUNTER — Encounter (HOSPITAL_COMMUNITY): Payer: Self-pay | Admitting: Licensed Clinical Social Worker

## 2024-09-18 ENCOUNTER — Ambulatory Visit (HOSPITAL_COMMUNITY): Admitting: Licensed Clinical Social Worker

## 2024-09-18 DIAGNOSIS — F323 Major depressive disorder, single episode, severe with psychotic features: Secondary | ICD-10-CM

## 2024-09-23 ENCOUNTER — Telehealth (HOSPITAL_COMMUNITY): Payer: Self-pay | Admitting: *Deleted

## 2024-09-23 ENCOUNTER — Telehealth (HOSPITAL_COMMUNITY): Admitting: Psychiatry

## 2024-09-23 ENCOUNTER — Encounter (HOSPITAL_COMMUNITY): Payer: Self-pay | Admitting: Psychiatry

## 2024-09-23 VITALS — Wt 130.0 lb

## 2024-09-23 DIAGNOSIS — F323 Major depressive disorder, single episode, severe with psychotic features: Secondary | ICD-10-CM

## 2024-09-23 DIAGNOSIS — R413 Other amnesia: Secondary | ICD-10-CM

## 2024-09-23 DIAGNOSIS — F4312 Post-traumatic stress disorder, chronic: Secondary | ICD-10-CM

## 2024-09-23 DIAGNOSIS — F410 Panic disorder [episodic paroxysmal anxiety] without agoraphobia: Secondary | ICD-10-CM

## 2024-09-23 MED ORDER — BUPROPION HCL ER (XL) 150 MG PO TB24: 150.0000 mg | ORAL_TABLET | Freq: Every day | ORAL | 1 refills | Status: AC

## 2024-09-23 MED ORDER — AMITRIPTYLINE HCL 100 MG PO TABS: 100.0000 mg | ORAL_TABLET | Freq: Every day | ORAL | 1 refills | Status: AC

## 2024-09-23 MED ORDER — CLONAZEPAM 0.5 MG PO TABS: 0.5000 mg | ORAL_TABLET | Freq: Every day | ORAL | 1 refills | Status: AC | PRN

## 2024-09-23 NOTE — Progress Notes (Signed)
 [facility] MD Virtual Progress Note   Patient Location: Home Provider Location: Home Office  I connect with patient by video and verified that I am speaking with correct person by using two identifiers. I discussed the limitations of evaluation and management by telemedicine and the availability of in person appointments. I also discussed with the patient that there may be a patient responsible charge related to this service. The patient expressed understanding and agreed to proceed.  Valerie Richards [id number] 53 y.o.  09/23/2024 8:59 AM  History of Present Illness:  Patient is evaluated by video session.  She reported started to have significant memory issues.  Sometimes she struggle with focus, attention, and forgetting things to do.  Now she noticed struggle with names, directions and content of the subject during conversation.  She noticed this is getting worse and sometimes she gets so anxious that she call her daughter for reassurance.  She also reported chronic crying, fear, depressive thoughts.  She noticed lately more sensitive and getting easily emotional and upset if you do not remember things.  She reported eating junk and gained few pounds since the last visit.  She reported hopelessness or worthlessness but denies any suicidal thoughts.  She also noticed occasionally hearing noises in her ear but no command auditory hallucination.  She is still afraid to go outside and feel paranoid.  Her sleep is on and off and reported flashbacks and nightmares about the past.  She denies any family history of Alzheimer's but also reported most of her family member died at an early age.  She has a daughter who lives in [place] who she communicated frequently and she has a daughter who lives close but busy with her to children.  She is nervous because her daughter who live close by is pregnant but her pregnancy is not going well.  She recently had a visit with her primary care who  suggested to cut down the Klonopin  because of memory.  Patient told she had not cut down because sometimes she feel her panic attack will come back.  She is taking amitriptyline  200 mg at bedtime but also reported some nights she forgets to take it.  When asked about her memory issues duration patient told she was doing fine until lately started and now getting worse.  She do not recall any ADHD symptoms early in her age.  She is in therapy with Harlene to help her coping skills.  Occasionally she go outside when she has to go to the grocery store.  She is able to drive but most of the time she stays by herself.  She denies drinking or using any illegal substances.  She has no tremor or shakes or any EPS.  Past Psychiatric History: H/O depression, anxiety, childhood trauma and panic attack.  Saw provider few years ago at [facility] and prescribed Lexapro, Prozac, Effexor, mirtazapine  and trazodone  but did not felt it helped.  Prescribed Zoloft , Ambien and hydroxyzine by PCP.  We tried Cymbalta  and Abilify .    Past Medical History:  Diagnosis Date   Acute recurrent ethmoidal sinusitis 09/03/2018   Anemia    iron deficiency   Anxiety    Depression    Fibroid    Menorrhagia 01/20/2021   Migraine    Nasal pain 09/03/2018   Stroke (HCC) 12/2020   See MRI.   Vaginal discharge 01/20/2021   Vaginitis and vulvovaginitis 01/20/2021    Outpatient Encounter Medications as of 09/23/2024  Medication Sig  amitriptyline  (ELAVIL ) 100 MG tablet Take 2 tablets (200 mg total) by mouth at bedtime.   clonazePAM  (KLONOPIN ) 0.5 MG tablet Take 1 tablet (0.5 mg total) by mouth every morning.   estradiol  (ESTRACE ) 0.01 % CREA vaginal cream Place 1 g vaginally 2 (two) times a week. Initial dose: nightly x 2 weeks, then twice weekly   LINZESS  290 MCG CAPS capsule Take 1 capsule (290 mcg total) by mouth every morning.   Rimegepant Sulfate (NURTEC) 75 MG TBDP Take 1 tablet (75 mg total) by mouth as needed (Take one  tablet as needed at the earlist onset of a Migraine.  Max 1 tablet in 24 hours).   No facility-administered encounter medications on file as of 09/23/2024.    No results found for this or any previous visit (from the past 2160 hours).    Psychiatric Specialty Exam: Physical Exam  Review of Systems  Neurological:        Memory issues  Psychiatric/Behavioral:  Positive for decreased concentration, dysphoric mood and sleep disturbance. The patient is nervous/anxious.     Weight 130 lb (59 kg), last menstrual period 02/26/2020.There is no height or weight on file to calculate BMI.  General Appearance: Casual  Eye Contact:  Good  Speech:  Slow  Volume:  Decreased  Mood:  Anxious and Dysphoric  Affect:  Congruent  Thought Process:  Goal Directed  Orientation:  Full (Time, Place, and Person)  Thought Content:  Rumination and reported hearing noises which are nonspecific  Suicidal Thoughts:  No  Homicidal Thoughts:  No  Memory:  Immediate;   Fair Recent;   Fair Remote;   Good  Judgement:  Intact  Insight:  Present  Psychomotor Activity:  Normal  Concentration:  Concentration: Fair and Attention Span: Fair  Recall:  Good  Fund of Knowledge:  Good  Language:  Good  Akathisia:  No  Handed:  Right  AIMS (if indicated):     Assets:  Communication Skills Desire for Improvement Housing Social Support  ADL's:  Intact  Cognition:  WNL  Sleep:  fair       06/12/2024   10:47 AM 11/09/2023    9:54 AM 08/10/2023    9:04 AM 05/29/2023   10:07 AM 05/10/2023    9:33 AM  Depression screen PHQ 2/9  Decreased Interest 1 2 2 3 3   Down, Depressed, Hopeless 3 3 3 3 3   PHQ - 2 Score 4 5 5 6 6   Altered sleeping 3 3 3 3 3   Tired, decreased energy 3 3 3 3 3   Change in appetite 2 2 2 3 2   Feeling bad or failure about yourself  3 3 3 3 3   Trouble concentrating 3 3 3 3 3   Moving slowly or fidgety/restless 2 3 3 3 2   Suicidal thoughts 2 2 2 3 3   PHQ-9 Score 22  24  24  27  25    Difficult  doing work/chores  Extremely dIfficult Extremely dIfficult  Extremely dIfficult     Data saved with a previous flowsheet row definition    Assessment/Plan: Severe major depression with psychotic features (HCC) - Plan: amitriptyline  (ELAVIL ) 100 MG tablet, clonazePAM  (KLONOPIN ) 0.5 MG tablet, buPROPion  (WELLBUTRIN  XL) 150 MG 24 hr tablet  Chronic post-traumatic stress disorder (PTSD) - Plan: clonazePAM  (KLONOPIN ) 0.5 MG tablet, buPROPion  (WELLBUTRIN  XL) 150 MG 24 hr tablet  Panic attacks - Plan: clonazePAM  (KLONOPIN ) 0.5 MG tablet  Memory difficulties - Plan: buPROPion  (WELLBUTRIN  XL) 150 MG 24 hr tablet  Patient is 53 year old widowed unemployed female with history of headache, major depressive disorder with psychotic features, chronic PTSD, panic attacks and now having memory problems.  Unlikely ADHD symptoms as patient recall having the symptoms started recently and getting worse.  Occasionally hearing noises.  Discussed forgetfulness, directions, names and getting more sensitive.  Recommend to have neurocognitive testing and referred to see neurology.  I recommend cut down the amitriptyline  from 200 mg to 100 mg at bedtime.  Take the Klonopin  only for severe panic attack and try to avoid taking on a regular basis if do not needed.  I will also start low-dose Wellbutrin  XL 150 mg in the morning to help resident anxiety, attention, focus.  Discussed medication side effects and benefits specially in the beginning can cause tremors and shakes.  Encouraged to keep therapy with Harlene.  Will follow-up in 4 to 6 weeks.  Encouraged to call back if she has any question or concern.  Discussed safety concerns at any time having active suicidal thoughts or homicidal thought that she need to call 911 or go to local emergency room.  Follow Up Instructions:     I discussed the assessment and treatment plan with the patient. The patient was provided an opportunity to ask questions and all were answered. The  patient agreed with the plan and demonstrated an understanding of the instructions.   The patient was advised to call back or seek an in-person evaluation if the symptoms worsen or if the condition fails to improve as anticipated.    Collaboration of Care: Other provider involved in patient's care AEB notes are available in epic to review  Patient/Guardian was advised Release of Information must be obtained prior to any record release in order to collaborate their care with an outside provider. Patient/Guardian was advised if they have not already done so to contact the registration department to sign all necessary forms in order for us  to release information regarding their care.   Consent: Patient/Guardian gives verbal consent for treatment and assignment of benefits for services provided during this visit. Patient/Guardian expressed understanding and agreed to proceed.     Total encounter time 28 minutes which includes face-to-face time, chart reviewed, care coordination, order entry and documentation during this encounter.   Note: This document was prepared by [employer] voice dictation technology and any errors that results from this process are unintentional.    Leni ONEIDA Client, MD 09/23/2024

## 2024-09-23 NOTE — Telephone Encounter (Signed)
 Referrals made to [facility] neurology and [facility] for significant memory loss.

## 2024-10-02 ENCOUNTER — Encounter (HOSPITAL_COMMUNITY): Payer: Self-pay | Admitting: Licensed Clinical Social Worker

## 2024-10-02 ENCOUNTER — Ambulatory Visit (HOSPITAL_COMMUNITY): Admitting: Licensed Clinical Social Worker

## 2024-10-02 DIAGNOSIS — F410 Panic disorder [episodic paroxysmal anxiety] without agoraphobia: Secondary | ICD-10-CM | POA: Diagnosis not present

## 2024-10-02 DIAGNOSIS — F323 Major depressive disorder, single episode, severe with psychotic features: Secondary | ICD-10-CM | POA: Diagnosis not present

## 2024-10-02 NOTE — Progress Notes (Signed)
 Virtual Visit via Video Note  I connected with Brynna A Fenley on 10/02/2024 at  8:00 AM EST by a video enabled telemedicine application and verified that I am speaking with the correct person using two identifiers.  Location: Patient: home Provider: home office   I discussed the limitations of evaluation and management by telemedicine and the availability of in person appointments. The patient expressed understanding and agreed to proceed.   I discussed the assessment and treatment plan with the patient. The patient was provided an opportunity to ask questions and all were answered. The patient agreed with the plan and demonstrated an understanding of the instructions.   The patient was advised to call back or seek an in-person evaluation if the symptoms worsen or if the condition fails to improve as anticipated.  I provided 45 minutes of non-face-to-face time during this encounter.   Harlene JONELLE Rosser, LCSW   THERAPIST PROGRESS NOTE  Session Time: 8:10am-8:55am  Participation Level: Active  Behavioral Response: NeatDrowsyAnxious  Type of Therapy: Individual Therapy  Treatment Goals addressed:  LTG: Recall traumatic events without becoming overwhelmed with negative emotions (BH CCP Acute or Chronic Trauma Reaction) Disciplines:  Interdisciplinary, PROVIDER Expected end:  06/20/24 STG: Shanty Ginty will verbalize an increased sense of mastery over PTSD symptoms by using several techniques to cope with flashbacks, decrease the power of triggers, and decrease negative thinking (BH CCP Acute or Chronic Trauma Reaction) Disciplines:  Interdisciplinary, PROVIDER Expected end:  06/20/24  ProgressTowards Goals: Not Progressing  Interventions: CBT  Summary: Zenia Guest Suniga is a 53 y.o. female who presents with Severe MDD with psychotic features and panic attacks.   Suicidal/Homicidal: Nowithout intent/plan  Therapist Response: Ioma engaged well in virtual individual session with  clinician. Clinician utilized CBT to process thoughts, feelings, and behaviors. Clinician explored anxiety and depressive sxs. Clinician discussed coping skills and identified some improvement in her willingness to leave the house. Clinician explored updates with going to [facility] with son to exercise. Clinician discussed ways to cope with anxiety when out in public. Clinician provided psychoeducation about using cold as a treatment for managing panic attacks.  Jovanni shared plans to put house on the market due to concerns about safety, being haunted by memories of her husband in the home, and fears of being alone. Sindia shared Wellbutrin  has been somewhat helpful, but feels the dosage needs to be increased at next appt. Clinician provided feedback about dosages and the possibility of increasing if needed.   Plan: Return again in 2 weeks.  Diagnosis: Severe major depression with psychotic features (HCC)  Panic attacks  Collaboration of Care: Psychiatrist AEB reviewed note from Dr. Curry  Patient/Guardian was advised Release of Information must be obtained prior to any record release in order to collaborate their care with an outside provider. Patient/Guardian was advised if they have not already done so to contact the registration department to sign all necessary forms in order for us  to release information regarding their care.   Consent: Patient/Guardian gives verbal consent for treatment and assignment of benefits for services provided during this visit. Patient/Guardian expressed understanding and agreed to proceed.   Harlene JONELLE [place], LCSW 10/02/2024

## 2024-10-04 ENCOUNTER — Ambulatory Visit
Admission: RE | Admit: 2024-10-04 | Discharge: 2024-10-04 | Disposition: A | Discharge status: Home / Self Care | Source: Ambulatory Visit | Attending: Urology | Admitting: Urology

## 2024-10-04 ENCOUNTER — Encounter (HOSPITAL_COMMUNITY): Payer: Self-pay | Admitting: Licensed Clinical Social Worker

## 2024-10-04 DIAGNOSIS — N3946 Mixed incontinence: Secondary | ICD-10-CM

## 2024-10-04 DIAGNOSIS — R3121 Asymptomatic microscopic hematuria: Secondary | ICD-10-CM

## 2024-10-04 DIAGNOSIS — R350 Frequency of micturition: Secondary | ICD-10-CM

## 2024-10-04 MED ORDER — GADOPICLENOL 0.5 MMOL/ML IV SOLN
6.0000 mL | Freq: Once | INTRAVENOUS | Status: AC | PRN
  Administered 2024-10-04: 6 mL via INTRAVENOUS

## 2024-10-04 NOTE — Progress Notes (Signed)
 Virtual Visit via Video Note  I connected with Valerie Richards on 09/18/24 at  8:00 AM EST by a video enabled telemedicine application and verified that I am speaking with the correct person using two identifiers.  Location: Patient: home Provider: home office   I discussed the limitations of evaluation and management by telemedicine and the availability of in person appointments. The patient expressed understanding and agreed to proceed.   I discussed the assessment and treatment plan with the patient. The patient was provided an opportunity to ask questions and all were answered. The patient agreed with the plan and demonstrated an understanding of the instructions.   The patient was advised to call back or seek an in-person evaluation if the symptoms worsen or if the condition fails to improve as anticipated.  I provided 45 minutes of non-face-to-face time during this encounter.   Valerie JONELLE Rosser, LCSW   THERAPIST PROGRESS NOTE  Session Time: 8:00am-8:45am  Participation Level: Active  Behavioral Response: CasualAlertAnxious and Depressed  Type of Therapy: Individual Therapy  Treatment Goals addressed: LTG: Recall traumatic events without becoming overwhelmed with negative emotions (BH CCP Acute or Chronic Trauma Reaction) Disciplines:  Interdisciplinary, PROVIDER Expected end:  06/20/24 STG: Valerie Richards will verbalize an increased sense of mastery over PTSD symptoms by using several techniques to cope with flashbacks, decrease the power of triggers, and decrease negative thinking (BH CCP Acute or Chronic Trauma Reaction) Disciplines:  Interdisciplinary, PROVIDER Expected end:  06/20/24  ProgressTowards Goals: Not Progressing  Interventions: Motivational Interviewing and Solution Focused  Summary: Valerie Richards is a 53 y.o. female who presents with MDD, severe, with psychotic features and PTSD.   Suicidal/Homicidal: Nowithout intent/plan  Therapist Response: Valerie Richards  engaged well in individual virtual session with clinician. Clinician utilized MI and Solution Focused therapy to process thoughts, feelings, and interactions. Valerie Richards shared that she has not been ok due to extreme anxiety and fear in her home. She shared recent criminal activity in the area, as well as living without many neighbors, as factors in her anxiety, as well as sounds of her deceased husband in the home. Clinician reflected the high level of fear, difficulty sleeping, and inability cope with her thoughts. Clinician provided information about EFT tapping and encouraged this as a daily practice. While practicing, Valerie Richards started to yawn and relax. She shared plans to sell her house and move after the holidays.   Plan: Return again in 2 weeks.  Diagnosis: Severe major depression with psychotic features (HCC)  Collaboration of Care: Psychiatrist AEB updated Dr Curry and requested a sooner appt.  Patient/Guardian was advised Release of Information must be obtained prior to any record release in order to collaborate their care with an outside provider. Patient/Guardian was advised if they have not already done so to contact the registration department to sign all necessary forms in order for us  to release information regarding their care.   Consent: Patient/Guardian gives verbal consent for treatment and assignment of benefits for services provided during this visit. Patient/Guardian expressed understanding and agreed to proceed.   Valerie JONELLE [place], LCSW 10/04/2024

## 2024-10-16 ENCOUNTER — Ambulatory Visit (HOSPITAL_COMMUNITY): Admitting: Licensed Clinical Social Worker

## 2024-10-16 ENCOUNTER — Encounter (HOSPITAL_COMMUNITY): Payer: Self-pay

## 2024-10-23 ENCOUNTER — Encounter (HOSPITAL_COMMUNITY): Payer: Self-pay

## 2024-10-23 ENCOUNTER — Encounter (HOSPITAL_COMMUNITY): Payer: Self-pay | Admitting: Licensed Clinical Social Worker

## 2024-10-23 ENCOUNTER — Ambulatory Visit (INDEPENDENT_AMBULATORY_CARE_PROVIDER_SITE_OTHER): Admitting: Licensed Clinical Social Worker

## 2024-10-23 DIAGNOSIS — F4312 Post-traumatic stress disorder, chronic: Secondary | ICD-10-CM

## 2024-10-23 NOTE — Progress Notes (Signed)
 Virtual Visit via Video Note  I connected with Valerie Richards on 10/23/2024 at 10:00 AM EST by a video enabled telemedicine application and verified that I am speaking with the correct person using two identifiers.  Location: Patient: home Provider: home office   I discussed the limitations of evaluation and management by telemedicine and the availability of in person appointments. The patient expressed understanding and agreed to proceed.    I discussed the assessment and treatment plan with the patient. The patient was provided an opportunity to ask questions and all were answered. The patient agreed with the plan and demonstrated an understanding of the instructions.   The patient was advised to call back or seek an in-person evaluation if the symptoms worsen or if the condition fails to improve as anticipated.  I provided 45 minutes of non-face-to-face time during this encounter.   Valerie JONELLE Rosser, LCSW   THERAPIST PROGRESS NOTE  Session Time: 10:00am-10:45am  Participation Level: Active  Behavioral Response: CasualAlertAnxious and Depressed  Type of Therapy: Individual Therapy  Treatment Goals addressed:   Active     BH CCP Acute or Chronic Trauma Reaction     LTG: Recall traumatic events without becoming overwhelmed with negative emotions (Progressing)     Start:  12/19/23    Expected End:  06/20/24         STG: Valerie Richards will verbalize an increased sense of mastery over PTSD symptoms by using several techniques to cope with flashbacks, decrease the power of triggers, and decrease negative thinking (Progressing)     Start:  12/19/23    Expected End:  06/20/24         Work with Valerie Richards to track symptoms, triggers, and/or skill use through a mood chart, diary card, or journal     Start:  12/19/23            ProgressTowards Goals: Not Progressing  Interventions: Motivational Interviewing and Solution Focused  Summary: Valerie Richards is a 54 y.o.  female who presents with chronic PTSD severe.   Suicidal/Homicidal: Nowithout intent/plan  Therapist Response: Valerie Richards engaged well in individual virtual session with clinician. Clinician utilized MI OARS and Solution Focused therapy to process thoughts, feelings, and behaviors. Clinician identified significant ongoing challenges with anxiety, noting continuous panic attacks all the time. Clinician explored possible triggers, noting that they are more prevalent at home and in the evening. However, she reported that panic attacks are becoming more frequent during the daytime as well. Clinician checked in on medication and identified that they are not helping much.  Clinician discussed coping skills, including tapping and attending to her eating habits. Clinician provided psychoeducation about the Vagus Nerve and the role of gut health in anxiety management. Clinician reviewed eating habits and caffeine consumption. Clinician encouraged Valerie Richards to drink water before coffee and to eat something with her coffee in order to protect her gut. Clinician also discussed the importance of getting outside and moving, deep breathing fresh air, and staying close to social connections.   Plan: Return again in 1-2 weeks.  Diagnosis: Chronic post-traumatic stress disorder (PTSD)  Collaboration of Care: Psychiatrist AEB updated Dr. Arfeen  Patient/Guardian was advised Release of Information must be obtained prior to any record release in order to collaborate their care with an outside provider. Patient/Guardian was advised if they have not already done so to contact the registration department to sign all necessary forms in order for us  to release information regarding their care.   Consent:  Patient/Guardian gives verbal consent for treatment and assignment of benefits for services provided during this visit. Patient/Guardian expressed understanding and agreed to proceed.   Valerie SAUNDERS [place], LCSW 10/23/2024

## 2024-10-30 ENCOUNTER — Ambulatory Visit (INDEPENDENT_AMBULATORY_CARE_PROVIDER_SITE_OTHER): Admitting: Licensed Clinical Social Worker

## 2024-10-30 DIAGNOSIS — F4310 Post-traumatic stress disorder, unspecified: Secondary | ICD-10-CM

## 2024-11-04 ENCOUNTER — Encounter (HOSPITAL_COMMUNITY): Payer: Self-pay | Admitting: Licensed Clinical Social Worker

## 2024-11-04 NOTE — Progress Notes (Signed)
 Virtual Visit via Video Note  I connected with Valerie Richards on 10/30/24 at 10:00 AM EST by a video enabled telemedicine application and verified that I am speaking with the correct person using two identifiers.  Location: Patient: daughter's home Provider: home office   I discussed the limitations of evaluation and management by telemedicine and the availability of in person appointments. The patient expressed understanding and agreed to proceed.    I discussed the assessment and treatment plan with the patient. The patient was provided an opportunity to ask questions and all were answered. The patient agreed with the plan and demonstrated an understanding of the instructions.   The patient was advised to call back or seek an in-person evaluation if the symptoms worsen or if the condition fails to improve as anticipated.  I provided 45 minutes of non-face-to-face time during this encounter.   Valerie JONELLE Rosser, LCSW   THERAPIST PROGRESS NOTE  Session Time: 10:00am-10:45am  Participation Level: Active  Behavioral Response: CasualAlertAnxious and Depressed  Type of Therapy: Individual Therapy  Treatment Goals addressed:  Active     BH CCP Acute or Chronic Trauma Reaction     LTG: Recall traumatic events without becoming overwhelmed with negative emotions (Not Progressing)     Start:  12/19/23    Expected End:  10/29/25         STG: Valerie Richards will verbalize an increased sense of mastery over PTSD symptoms by using several techniques to cope with flashbacks, decrease the power of triggers, and decrease negative thinking (Not Progressing)     Start:  12/19/23    Expected End:  10/29/25         Work with Valerie Richards to track symptoms, triggers, and/or skill use through a mood chart, diary card, or journal     Start:  12/19/23              ProgressTowards Goals: Not Progressing  Interventions: CBT  Summary: Valerie Richards is a 54 y.o. female who presents with  PTSD.   Suicidal/Homicidal: Nowithout intent/plan  Therapist Response: Valerie Richards engaged well in individual virtual session with clinician. Clinician utilized CBT to process thoughts, feelings, and interactions. Clinician explored anxiety and noted several incidents of panic attacks daily, without specific external triggers. Clinician explored internal triggers of unsafe thoughts, beliefs, as well as auditory alertness and possible hallucinations. Clinician discussed coping skills, which include having all the lights on in the house, listening to music, watching tv, or praying. Clinician explored alternatives to being in her home so much and measured anxiety levels when at her daughter's home. Clinician processed relationship with daughter and her husband. Clinician also identified that the plan to move into a smaller apartment where there are a lot of people around to meet and get involved with is paramount. Valerie Richards shared this will not happen for a few months. However, it is on her list of goals for the year.   Plan: Return again in 1-2 weeks.  Diagnosis: PTSD (post-traumatic stress disorder)  Collaboration of Care: Psychiatrist AEB Dr. Curry is aware of panic sxs  Patient/Guardian was advised Release of Information must be obtained prior to any record release in order to collaborate their care with an outside provider. Patient/Guardian was advised if they have not already done so to contact the registration department to sign all necessary forms in order for us  to release information regarding their care.   Consent: Patient/Guardian gives verbal consent for treatment and assignment of benefits for  services provided during this visit. Patient/Guardian expressed understanding and agreed to proceed.   Valerie SAUNDERS [place], LCSW 11/04/2024

## 2024-11-06 ENCOUNTER — Ambulatory Visit (HOSPITAL_COMMUNITY): Admitting: Licensed Clinical Social Worker

## 2024-11-06 ENCOUNTER — Encounter (HOSPITAL_COMMUNITY): Payer: Self-pay

## 2024-11-13 ENCOUNTER — Ambulatory Visit (HOSPITAL_COMMUNITY): Admitting: Licensed Clinical Social Worker

## 2024-11-21 ENCOUNTER — Ambulatory Visit (HOSPITAL_COMMUNITY): Admitting: Licensed Clinical Social Worker

## 2024-12-04 ENCOUNTER — Ambulatory Visit (HOSPITAL_COMMUNITY): Admitting: Licensed Clinical Social Worker

## 2025-03-24 ENCOUNTER — Telehealth (HOSPITAL_COMMUNITY): Admitting: Psychiatry
